# Patient Record
Sex: Female | Born: 1985 | Race: White | Hispanic: No | Marital: Single | Smoking: Former smoker
Health system: Southern US, Community
[De-identification: ages and names within clinical notes are randomized; demographics above are authoritative.]

## PROBLEM LIST (undated history)

## (undated) DIAGNOSIS — I10 Essential (primary) hypertension: Secondary | ICD-10-CM

## (undated) DIAGNOSIS — K5900 Constipation, unspecified: Secondary | ICD-10-CM

## (undated) DIAGNOSIS — N319 Neuromuscular dysfunction of bladder, unspecified: Secondary | ICD-10-CM

## (undated) DIAGNOSIS — G8929 Other chronic pain: Secondary | ICD-10-CM

## (undated) DIAGNOSIS — M545 Low back pain, unspecified: Secondary | ICD-10-CM

## (undated) DIAGNOSIS — M810 Age-related osteoporosis without current pathological fracture: Secondary | ICD-10-CM

## (undated) DIAGNOSIS — N135 Crossing vessel and stricture of ureter without hydronephrosis: Secondary | ICD-10-CM

## (undated) DIAGNOSIS — Z940 Kidney transplant status: Secondary | ICD-10-CM

## (undated) DIAGNOSIS — K219 Gastro-esophageal reflux disease without esophagitis: Secondary | ICD-10-CM

## (undated) DIAGNOSIS — M549 Dorsalgia, unspecified: Secondary | ICD-10-CM

## (undated) DIAGNOSIS — R002 Palpitations: Secondary | ICD-10-CM

## (undated) DIAGNOSIS — I639 Cerebral infarction, unspecified: Secondary | ICD-10-CM

## (undated) DIAGNOSIS — N289 Disorder of kidney and ureter, unspecified: Secondary | ICD-10-CM

## (undated) DIAGNOSIS — T8611 Kidney transplant rejection: Secondary | ICD-10-CM

## (undated) DIAGNOSIS — E7800 Pure hypercholesterolemia, unspecified: Secondary | ICD-10-CM

## (undated) DIAGNOSIS — N390 Urinary tract infection, site not specified: Secondary | ICD-10-CM

## (undated) DIAGNOSIS — F419 Anxiety disorder, unspecified: Secondary | ICD-10-CM

## (undated) DIAGNOSIS — R0602 Shortness of breath: Secondary | ICD-10-CM

## (undated) DIAGNOSIS — F32A Depression, unspecified: Secondary | ICD-10-CM

## (undated) DIAGNOSIS — R609 Edema, unspecified: Secondary | ICD-10-CM

## (undated) DIAGNOSIS — D649 Anemia, unspecified: Secondary | ICD-10-CM

## (undated) DIAGNOSIS — D6851 Activated protein C resistance: Secondary | ICD-10-CM

## (undated) DIAGNOSIS — J189 Pneumonia, unspecified organism: Secondary | ICD-10-CM

## (undated) DIAGNOSIS — Z789 Other specified health status: Secondary | ICD-10-CM

## (undated) DIAGNOSIS — M109 Gout, unspecified: Secondary | ICD-10-CM

## (undated) DIAGNOSIS — K682 Retroperitoneal fibrosis: Secondary | ICD-10-CM

## (undated) DIAGNOSIS — N83209 Unspecified ovarian cyst, unspecified side: Secondary | ICD-10-CM

## (undated) DIAGNOSIS — G43909 Migraine, unspecified, not intractable, without status migrainosus: Secondary | ICD-10-CM

## (undated) DIAGNOSIS — Z9981 Dependence on supplemental oxygen: Secondary | ICD-10-CM

## (undated) DIAGNOSIS — T8612 Kidney transplant failure: Secondary | ICD-10-CM

## (undated) DIAGNOSIS — R569 Unspecified convulsions: Secondary | ICD-10-CM

## (undated) DIAGNOSIS — M1990 Unspecified osteoarthritis, unspecified site: Secondary | ICD-10-CM

## (undated) DIAGNOSIS — F329 Major depressive disorder, single episode, unspecified: Secondary | ICD-10-CM

## (undated) DIAGNOSIS — E559 Vitamin D deficiency, unspecified: Secondary | ICD-10-CM

## (undated) HISTORY — DX: Vitamin D deficiency, unspecified: E55.9

## (undated) HISTORY — DX: Age-related osteoporosis without current pathological fracture: M81.0

## (undated) HISTORY — DX: Disorder of kidney and ureter, unspecified: N28.9

## (undated) HISTORY — PX: PACEMAKER INSERTION: SHX728

## (undated) HISTORY — DX: Essential (primary) hypertension: I10

## (undated) HISTORY — DX: Shortness of breath: R06.02

## (undated) HISTORY — PX: KIDNEY TRANSPLANT: SHX239

## (undated) HISTORY — PX: ABDOMINAL HERNIA REPAIR: SHX539

## (undated) HISTORY — DX: Anemia, unspecified: D64.9

## (undated) HISTORY — DX: Constipation, unspecified: K59.00

## (undated) HISTORY — DX: Palpitations: R00.2

## (undated) HISTORY — DX: Gastro-esophageal reflux disease without esophagitis: K21.9

## (undated) HISTORY — DX: Dorsalgia, unspecified: M54.9

## (undated) HISTORY — PX: PARTIAL NEPHRECTOMY: SHX414

## (undated) HISTORY — DX: Edema, unspecified: R60.9

## (undated) HISTORY — PX: HERNIA REPAIR: SHX51

---
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
## 2004-07-09 DIAGNOSIS — J189 Pneumonia, unspecified organism: Secondary | ICD-10-CM

## 2004-07-09 HISTORY — DX: Pneumonia, unspecified organism: J18.9

## 2008-05-14 ENCOUNTER — Ambulatory Visit (HOSPITAL_BASED_OUTPATIENT_CLINIC_OR_DEPARTMENT_OTHER): Admission: RE | Admit: 2008-05-14 | Discharge: 2008-05-14 | Payer: Self-pay | Admitting: Urology

## 2010-11-21 NOTE — Op Note (Signed)
NAMEANSELMA, Donna Shannon              ACCOUNT NO.:  [id number]   MEDICAL RECORD NO.:  [id number]          PATIENT TYPE:  AMB   LOCATION:  [facility]                         FACILITY:  [facility]   PHYSICIAN:  Jamison Neighbor, M.D.  DATE OF BIRTH:  1985-12-27   DATE OF PROCEDURE:  05/14/2008  DATE OF DISCHARGE:                               OPERATIVE REPORT   SERVICE:  Urology.   PREOPERATIVE DIAGNOSES:  1. Neurogenic bladder.  2. Interstitial cystitis.   POSTOPERATIVE DIAGNOSES:  1. Neurogenic bladder.  2. Interstitial cystitis.   PROCEDURE:  Cystoscopy, urethral calibration, hydrodistention of the  bladder, cauterization of ulcer, Foley catheter placement, Marcaine and  Pyridium instillation, Marcaine and Kenalog injection.   SURGEON:  Jamison Neighbor, M.D.   ANESTHESIA:  General.   COMPLICATIONS:  None.   DRAINS:  Foley catheter.   BRIEF HISTORY:  This 25 year old female has a very complicated urologic  history.  The patient underwent reconstructive surgery as a young child  by Dr. Mosetta Anis over in [facility].  The patient developed  renal failure and does have a transplanted kidney that seems to be  working normally.  Creatinine today was 1.3.  The patient has symptoms  of interstitial cystitis with chronic pelvic pain.  She has not  responded particularly well to medical therapy or to instillation  therapy.  It has been recommended to her that she consider  reaugmentation plus placement of a catheterized abdominal stoma.  The  patient has significant history of retroperitoneal fibrosis and that  surgery was certain to be quite complicated by her immunocompromised  status.  She is looking for alternatives.  She is now to undergo  cystoscopy and hydrodistention.  She understands the risks and benefits  of the procedure and gave full informed consent.   PROCEDURE IN DETAIL:  After successful induction of general anesthesia  the patient was placed in the dorsal lithotomy  position and prepped with  Betadine and draped in the usual sterile fashion.  Careful bimanual  examination revealed no significant cystocele, rectocele or enterocele.  The uterus was palpably normal.  The urethra was calibrated and accepted  a female urethral sound of 13 French with no signs of stenosis.  However, it was slightly angulated from the previous reconstruction and  may explain why the patient is sometimes slightly tight when she has to  catheterize.  The cystoscope was inserted.  The bladder was carefully  inspected.  There were areas that were somewhat irregular.  It was very  difficult to discern where the augmentation tissue was present and where  a normal bladder was present.  The patient had a single ureter that  could be easily identified.  The native ureters were never well seen.   The patient then underwent hydrodistention at a pressure of 100 cm of  water.  As the bladder was filling at 500 mL cracks started to be  noticeable on the left side and it was felt that it should not be filled  any further.  This ulcerated area did bleed and that area was  cauterized.  A cystogram  was done that showed no significant  extravasation although there is some irregularity in that area of the  ulcer.  The bladder was otherwise smooth with no signs of reflux in  either the native kidney or the transplant kidneys.  The bladder was  drained.  A mixture of Marcaine and Pyridium was left in the bladder.  Because of the bleeding it was felt a Foley catheter should be left in  place.  The patient was taken to the recovery room in good condition.  A  prescription was written for Tylox, doxycycline and Pyridium Plus.  We  will leave the Foley catheter in until the urine is clear.   We will discuss with the patient the possibly of trying serial Botox  injections and/or InterStim revision before recommending that she be  considered for augmentation.  It does appear clear however that the   patient does have diminished bladder capacity as the principal source  for her spasming and pain.      Jamison Neighbor, M.D.  Electronically Signed     RJE/MEDQ  D:  05/14/2008  T:  05/14/2008  Job:  [id number]

## 2011-04-10 LAB — BASIC METABOLIC PANEL
BUN: 14
Calcium: 9.2
Chloride: 110
GFR calc Af Amer: >60
GFR calc non Af Amer: 50 — ABNORMAL LOW
Glucose, Bld: 90
Potassium: 3.7
Sodium: 141

## 2011-04-10 LAB — POCT PREGNANCY, URINE: Preg Test, Ur: NEGATIVE

## 2011-04-10 LAB — PROTIME-INR: Prothrombin Time: 13

## 2011-04-30 DIAGNOSIS — Z7960 Long term (current) use of unspecified immunomodulators and immunosuppressants: Secondary | ICD-10-CM | POA: Insufficient documentation

## 2011-04-30 DIAGNOSIS — Z79899 Other long term (current) drug therapy: Secondary | ICD-10-CM | POA: Insufficient documentation

## 2011-04-30 DIAGNOSIS — D682 Hereditary deficiency of other clotting factors: Secondary | ICD-10-CM | POA: Insufficient documentation

## 2011-07-10 HISTORY — PX: OTHER SURGICAL HISTORY: SHX169

## 2011-12-22 DIAGNOSIS — R109 Unspecified abdominal pain: Secondary | ICD-10-CM | POA: Insufficient documentation

## 2011-12-22 DIAGNOSIS — N3010 Interstitial cystitis (chronic) without hematuria: Secondary | ICD-10-CM | POA: Insufficient documentation

## 2013-11-20 DIAGNOSIS — D849 Immunodeficiency, unspecified: Secondary | ICD-10-CM | POA: Insufficient documentation

## 2013-11-20 DIAGNOSIS — D899 Disorder involving the immune mechanism, unspecified: Secondary | ICD-10-CM

## 2014-03-18 DIAGNOSIS — N3941 Urge incontinence: Secondary | ICD-10-CM | POA: Insufficient documentation

## 2014-04-21 DIAGNOSIS — G43909 Migraine, unspecified, not intractable, without status migrainosus: Secondary | ICD-10-CM | POA: Insufficient documentation

## 2014-04-29 DIAGNOSIS — D638 Anemia in other chronic diseases classified elsewhere: Secondary | ICD-10-CM | POA: Insufficient documentation

## 2014-05-31 DIAGNOSIS — E781 Pure hyperglyceridemia: Secondary | ICD-10-CM | POA: Insufficient documentation

## 2015-02-17 DIAGNOSIS — M810 Age-related osteoporosis without current pathological fracture: Secondary | ICD-10-CM | POA: Insufficient documentation

## 2015-05-04 DIAGNOSIS — F5104 Psychophysiologic insomnia: Secondary | ICD-10-CM | POA: Insufficient documentation

## 2015-07-10 HISTORY — PX: SALPINGECTOMY: SHX328

## 2015-11-07 HISTORY — PX: PORTACATH PLACEMENT: SHX2246

## 2016-04-24 ENCOUNTER — Inpatient Hospital Stay (HOSPITAL_COMMUNITY)
Admission: AD | Admit: 2016-04-24 | Discharge: 2016-04-30 | DRG: 641 | Disposition: A | Discharge status: Home / Self Care | Payer: Medicare Other | Source: Ambulatory Visit | Attending: Internal Medicine | Admitting: Internal Medicine

## 2016-04-24 ENCOUNTER — Encounter (HOSPITAL_COMMUNITY): Payer: Self-pay | Admitting: Internal Medicine

## 2016-04-24 DIAGNOSIS — N182 Chronic kidney disease, stage 2 (mild): Secondary | ICD-10-CM | POA: Diagnosis not present

## 2016-04-24 DIAGNOSIS — F418 Other specified anxiety disorders: Secondary | ICD-10-CM | POA: Diagnosis present

## 2016-04-24 DIAGNOSIS — F329 Major depressive disorder, single episode, unspecified: Secondary | ICD-10-CM | POA: Diagnosis not present

## 2016-04-24 DIAGNOSIS — F32A Depression, unspecified: Secondary | ICD-10-CM | POA: Diagnosis present

## 2016-04-24 DIAGNOSIS — Z8701 Personal history of pneumonia (recurrent): Secondary | ICD-10-CM

## 2016-04-24 DIAGNOSIS — G4733 Obstructive sleep apnea (adult) (pediatric): Secondary | ICD-10-CM

## 2016-04-24 DIAGNOSIS — N183 Chronic kidney disease, stage 3 unspecified: Secondary | ICD-10-CM

## 2016-04-24 DIAGNOSIS — Z8673 Personal history of transient ischemic attack (TIA), and cerebral infarction without residual deficits: Secondary | ICD-10-CM

## 2016-04-24 DIAGNOSIS — E669 Obesity, unspecified: Secondary | ICD-10-CM | POA: Diagnosis present

## 2016-04-24 DIAGNOSIS — I10 Essential (primary) hypertension: Secondary | ICD-10-CM | POA: Diagnosis present

## 2016-04-24 DIAGNOSIS — M109 Gout, unspecified: Secondary | ICD-10-CM | POA: Diagnosis present

## 2016-04-24 DIAGNOSIS — Z7952 Long term (current) use of systemic steroids: Secondary | ICD-10-CM

## 2016-04-24 DIAGNOSIS — Z940 Kidney transplant status: Secondary | ICD-10-CM

## 2016-04-24 DIAGNOSIS — E6609 Other obesity due to excess calories: Secondary | ICD-10-CM

## 2016-04-24 DIAGNOSIS — I669 Occlusion and stenosis of unspecified cerebral artery: Secondary | ICD-10-CM

## 2016-04-24 DIAGNOSIS — E7800 Pure hypercholesterolemia, unspecified: Secondary | ICD-10-CM | POA: Diagnosis present

## 2016-04-24 DIAGNOSIS — R0602 Shortness of breath: Secondary | ICD-10-CM

## 2016-04-24 DIAGNOSIS — R519 Headache, unspecified: Secondary | ICD-10-CM

## 2016-04-24 DIAGNOSIS — E875 Hyperkalemia: Secondary | ICD-10-CM | POA: Diagnosis not present

## 2016-04-24 DIAGNOSIS — Z79899 Other long term (current) drug therapy: Secondary | ICD-10-CM

## 2016-04-24 DIAGNOSIS — G932 Benign intracranial hypertension: Secondary | ICD-10-CM | POA: Diagnosis not present

## 2016-04-24 DIAGNOSIS — G43909 Migraine, unspecified, not intractable, without status migrainosus: Secondary | ICD-10-CM | POA: Diagnosis present

## 2016-04-24 DIAGNOSIS — R51 Headache: Secondary | ICD-10-CM

## 2016-04-24 DIAGNOSIS — Z87891 Personal history of nicotine dependence: Secondary | ICD-10-CM

## 2016-04-24 DIAGNOSIS — I5032 Chronic diastolic (congestive) heart failure: Secondary | ICD-10-CM | POA: Diagnosis present

## 2016-04-24 DIAGNOSIS — E8770 Fluid overload, unspecified: Principal | ICD-10-CM | POA: Diagnosis present

## 2016-04-24 DIAGNOSIS — Z9181 History of falling: Secondary | ICD-10-CM

## 2016-04-24 DIAGNOSIS — R609 Edema, unspecified: Secondary | ICD-10-CM

## 2016-04-24 DIAGNOSIS — N3289 Other specified disorders of bladder: Secondary | ICD-10-CM | POA: Diagnosis present

## 2016-04-24 DIAGNOSIS — Z6838 Body mass index (BMI) 38.0-38.9, adult: Secondary | ICD-10-CM

## 2016-04-24 DIAGNOSIS — N319 Neuromuscular dysfunction of bladder, unspecified: Secondary | ICD-10-CM | POA: Diagnosis present

## 2016-04-24 DIAGNOSIS — Z87440 Personal history of urinary (tract) infections: Secondary | ICD-10-CM

## 2016-04-24 DIAGNOSIS — E662 Morbid (severe) obesity with alveolar hypoventilation: Secondary | ICD-10-CM | POA: Diagnosis present

## 2016-04-24 DIAGNOSIS — F909 Attention-deficit hyperactivity disorder, unspecified type: Secondary | ICD-10-CM | POA: Diagnosis present

## 2016-04-24 DIAGNOSIS — Z8249 Family history of ischemic heart disease and other diseases of the circulatory system: Secondary | ICD-10-CM

## 2016-04-24 DIAGNOSIS — Z23 Encounter for immunization: Secondary | ICD-10-CM

## 2016-04-24 DIAGNOSIS — D6851 Activated protein C resistance: Secondary | ICD-10-CM | POA: Diagnosis present

## 2016-04-24 DIAGNOSIS — I130 Hypertensive heart and chronic kidney disease with heart failure and stage 1 through stage 4 chronic kidney disease, or unspecified chronic kidney disease: Secondary | ICD-10-CM | POA: Diagnosis present

## 2016-04-24 DIAGNOSIS — G894 Chronic pain syndrome: Secondary | ICD-10-CM | POA: Diagnosis present

## 2016-04-24 DIAGNOSIS — Z9981 Dependence on supplemental oxygen: Secondary | ICD-10-CM

## 2016-04-24 HISTORY — DX: Low back pain: M54.5

## 2016-04-24 HISTORY — DX: Essential (primary) hypertension: I10

## 2016-04-24 HISTORY — DX: Cerebral infarction, unspecified: I63.9

## 2016-04-24 HISTORY — DX: Activated protein C resistance: D68.51

## 2016-04-24 HISTORY — DX: Urinary tract infection, site not specified: N39.0

## 2016-04-24 HISTORY — DX: Kidney transplant rejection: T86.11

## 2016-04-24 HISTORY — DX: Retroperitoneal fibrosis: K68.2

## 2016-04-24 HISTORY — DX: Migraine, unspecified, not intractable, without status migrainosus: G43.909

## 2016-04-24 HISTORY — DX: Pneumonia, unspecified organism: J18.9

## 2016-04-24 HISTORY — DX: Unspecified ovarian cyst, unspecified side: N83.209

## 2016-04-24 HISTORY — DX: Other chronic pain: G89.29

## 2016-04-24 HISTORY — DX: Anxiety disorder, unspecified: F41.9

## 2016-04-24 HISTORY — DX: Depression, unspecified: F32.A

## 2016-04-24 HISTORY — DX: Other specified health status: Z78.9

## 2016-04-24 HISTORY — DX: Gout, unspecified: M10.9

## 2016-04-24 HISTORY — DX: Unspecified osteoarthritis, unspecified site: M19.90

## 2016-04-24 HISTORY — DX: Kidney transplant failure: T86.12

## 2016-04-24 HISTORY — DX: Neuromuscular dysfunction of bladder, unspecified: N31.9

## 2016-04-24 HISTORY — DX: Kidney transplant status: Z94.0

## 2016-04-24 HISTORY — DX: Major depressive disorder, single episode, unspecified: F32.9

## 2016-04-24 HISTORY — DX: Dependence on supplemental oxygen: Z99.81

## 2016-04-24 HISTORY — DX: Crossing vessel and stricture of ureter without hydronephrosis: N13.5

## 2016-04-24 HISTORY — DX: Low back pain, unspecified: M54.50

## 2016-04-24 HISTORY — DX: Pure hypercholesterolemia, unspecified: E78.00

## 2016-04-24 MED ORDER — HYDROXYZINE HCL 25 MG PO TABS: 25.0000 mg | ORAL_TABLET | Freq: Three times a day (TID) | ORAL | Status: DC | PRN

## 2016-04-24 MED ORDER — ZOLPIDEM TARTRATE 5 MG PO TABS
5.0000 mg | ORAL_TABLET | Freq: Every evening | ORAL | Status: DC | PRN
  Administered 2016-04-24 – 2016-04-29 (×5): 5 mg via ORAL
  Filled 2016-04-24 (×5): qty 1

## 2016-04-24 MED ORDER — NEPRO/CARBSTEADY PO LIQD: 237.0000 mL | Freq: Three times a day (TID) | ORAL | Status: DC | PRN

## 2016-04-24 MED ORDER — OMEGA-3-ACID ETHYL ESTERS 1 G PO CAPS
1.0000 g | ORAL_CAPSULE | Freq: Three times a day (TID) | ORAL | Status: DC
  Administered 2016-04-25 – 2016-04-30 (×17): 1 g via ORAL
  Filled 2016-04-24 (×17): qty 1

## 2016-04-24 MED ORDER — FLAVOXATE HCL 100 MG PO TABS
100.0000 mg | ORAL_TABLET | Freq: Three times a day (TID) | ORAL | Status: DC
  Administered 2016-04-25 – 2016-04-30 (×16): 100 mg via ORAL
  Filled 2016-04-24 (×18): qty 1

## 2016-04-24 MED ORDER — ACETAMINOPHEN 325 MG PO TABS
650.0000 mg | ORAL_TABLET | Freq: Four times a day (QID) | ORAL | Status: DC | PRN
  Administered 2016-04-27: 650 mg via ORAL
  Filled 2016-04-24 (×2): qty 2

## 2016-04-24 MED ORDER — TRAMADOL HCL 50 MG PO TABS
50.0000 mg | ORAL_TABLET | Freq: Four times a day (QID) | ORAL | Status: DC | PRN
  Administered 2016-04-25 – 2016-04-29 (×9): 50 mg via ORAL
  Filled 2016-04-24 (×11): qty 1

## 2016-04-24 MED ORDER — MAGNESIUM OXIDE 400 (241.3 MG) MG PO TABS
800.0000 mg | ORAL_TABLET | Freq: Two times a day (BID) | ORAL | Status: DC
  Administered 2016-04-25 – 2016-04-30 (×12): 800 mg via ORAL
  Filled 2016-04-24 (×12): qty 2

## 2016-04-24 MED ORDER — FENOFIBRATE 160 MG PO TABS
160.0000 mg | ORAL_TABLET | Freq: Every day | ORAL | Status: DC
  Administered 2016-04-25 – 2016-04-30 (×6): 160 mg via ORAL
  Filled 2016-04-24 (×6): qty 1

## 2016-04-24 MED ORDER — CAMPHOR-MENTHOL 0.5-0.5 % EX LOTN
1.0000 "application " | TOPICAL_LOTION | Freq: Three times a day (TID) | CUTANEOUS | Status: DC | PRN
  Filled 2016-04-24: qty 222

## 2016-04-24 MED ORDER — FUROSEMIDE 10 MG/ML IJ SOLN
80.0000 mg | Freq: Two times a day (BID) | INTRAMUSCULAR | Status: DC
  Administered 2016-04-24 – 2016-04-26 (×4): 80 mg via INTRAVENOUS
  Filled 2016-04-24 (×4): qty 8

## 2016-04-24 MED ORDER — DOCUSATE SODIUM 283 MG RE ENEM
1.0000 | ENEMA | RECTAL | Status: DC | PRN
  Filled 2016-04-24: qty 1

## 2016-04-24 MED ORDER — PROMETHAZINE HCL 25 MG RE SUPP
25.0000 mg | Freq: Four times a day (QID) | RECTAL | Status: DC | PRN
  Administered 2016-04-26 – 2016-04-28 (×3): 25 mg via RECTAL
  Filled 2016-04-24 (×3): qty 1

## 2016-04-24 MED ORDER — ONDANSETRON HCL 4 MG PO TABS
4.0000 mg | ORAL_TABLET | Freq: Four times a day (QID) | ORAL | Status: DC | PRN
  Administered 2016-04-25 – 2016-04-29 (×2): 4 mg via ORAL
  Filled 2016-04-24 (×2): qty 1

## 2016-04-24 MED ORDER — ASPIRIN 81 MG PO CHEW
81.0000 mg | CHEWABLE_TABLET | Freq: Every day | ORAL | Status: DC
  Administered 2016-04-25 – 2016-04-30 (×6): 81 mg via ORAL
  Filled 2016-04-24 (×6): qty 1

## 2016-04-24 MED ORDER — CLONAZEPAM 1 MG PO TABS
1.0000 mg | ORAL_TABLET | Freq: Two times a day (BID) | ORAL | Status: DC
Start: 2016-04-25 — End: 2016-04-30
  Administered 2016-04-25 – 2016-04-30 (×12): 1 mg via ORAL
  Filled 2016-04-24 (×12): qty 1

## 2016-04-24 MED ORDER — PREDNISONE 5 MG PO TABS
2.5000 mg | ORAL_TABLET | Freq: Every day | ORAL | Status: DC
  Administered 2016-04-25 – 2016-04-30 (×6): 2.5 mg via ORAL
  Filled 2016-04-24 (×6): qty 1

## 2016-04-24 MED ORDER — ICOSAPENT ETHYL 1 G PO CAPS: 1.0000 | ORAL_CAPSULE | Freq: Two times a day (BID) | ORAL | Status: DC

## 2016-04-24 MED ORDER — LEVETIRACETAM 500 MG PO TABS
500.0000 mg | ORAL_TABLET | Freq: Two times a day (BID) | ORAL | Status: DC
  Administered 2016-04-25 – 2016-04-30 (×12): 500 mg via ORAL
  Filled 2016-04-24 (×12): qty 1

## 2016-04-24 MED ORDER — TERIPARATIDE (RECOMBINANT) 600 MCG/2.4ML ~~LOC~~ SOLN: 20.0000 ug | Freq: Every day | SUBCUTANEOUS | Status: DC

## 2016-04-24 MED ORDER — SODIUM CHLORIDE 0.9% FLUSH
10.0000 mL | INTRAVENOUS | Status: DC | PRN
  Administered 2016-04-29 – 2016-04-30 (×2): 20 mL
  Filled 2016-04-24 (×2): qty 40

## 2016-04-24 MED ORDER — CLONAZEPAM 0.5 MG PO TABS
0.5000 mg | ORAL_TABLET | ORAL | Status: DC
  Administered 2016-04-25 – 2016-04-29 (×5): 0.5 mg via ORAL
  Filled 2016-04-24 (×5): qty 1

## 2016-04-24 MED ORDER — CYCLOSPORINE MODIFIED (NEORAL) 25 MG PO CAPS
25.0000 mg | ORAL_CAPSULE | Freq: Every day | ORAL | Status: DC
  Administered 2016-04-25 – 2016-04-30 (×6): 25 mg via ORAL
  Filled 2016-04-24 (×6): qty 1

## 2016-04-24 MED ORDER — MYCOPHENOLATE MOFETIL 250 MG PO CAPS
500.0000 mg | ORAL_CAPSULE | Freq: Two times a day (BID) | ORAL | Status: DC
  Administered 2016-04-24 – 2016-04-30 (×12): 500 mg via ORAL
  Filled 2016-04-24 (×12): qty 2

## 2016-04-24 MED ORDER — PROCHLORPERAZINE MALEATE 10 MG PO TABS
10.0000 mg | ORAL_TABLET | Freq: Four times a day (QID) | ORAL | Status: DC | PRN
  Filled 2016-04-24 (×2): qty 1

## 2016-04-24 MED ORDER — ONDANSETRON HCL 4 MG/2ML IJ SOLN
4.0000 mg | Freq: Four times a day (QID) | INTRAMUSCULAR | Status: DC | PRN
  Administered 2016-04-28 – 2016-04-29 (×3): 4 mg via INTRAVENOUS
  Filled 2016-04-24 (×4): qty 2

## 2016-04-24 MED ORDER — METOPROLOL TARTRATE 100 MG PO TABS
100.0000 mg | ORAL_TABLET | Freq: Two times a day (BID) | ORAL | Status: DC
  Administered 2016-04-25: 100 mg via ORAL
  Filled 2016-04-24: qty 2

## 2016-04-24 MED ORDER — PRAVASTATIN SODIUM 40 MG PO TABS
40.0000 mg | ORAL_TABLET | Freq: Every day | ORAL | Status: DC
  Administered 2016-04-25 – 2016-04-29 (×6): 40 mg via ORAL
  Filled 2016-04-24 (×6): qty 1

## 2016-04-24 MED ORDER — SORBITOL 70 % SOLN: 30.0000 mL | Status: DC | PRN

## 2016-04-24 MED ORDER — HYDROCODONE-ACETAMINOPHEN 5-325 MG PO TABS
1.0000 | ORAL_TABLET | ORAL | Status: DC | PRN
  Administered 2016-04-24 – 2016-04-25 (×4): 1 via ORAL
  Filled 2016-04-24 (×5): qty 1

## 2016-04-24 MED ORDER — NORETHINDRONE 0.35 MG PO TABS
1.0000 | ORAL_TABLET | Freq: Every day | ORAL | Status: DC
  Administered 2016-04-27 – 2016-04-30 (×4): 0.35 mg via ORAL

## 2016-04-24 MED ORDER — GABAPENTIN 300 MG PO CAPS
300.0000 mg | ORAL_CAPSULE | Freq: Three times a day (TID) | ORAL | Status: DC
  Administered 2016-04-25 – 2016-04-30 (×17): 300 mg via ORAL
  Filled 2016-04-24 (×17): qty 1

## 2016-04-24 MED ORDER — CALCIUM CARBONATE ANTACID 1250 MG/5ML PO SUSP
500.0000 mg | Freq: Four times a day (QID) | ORAL | Status: DC | PRN
  Filled 2016-04-24: qty 5

## 2016-04-24 MED ORDER — FAMOTIDINE 20 MG PO TABS
20.0000 mg | ORAL_TABLET | Freq: Every day | ORAL | Status: DC
  Administered 2016-04-25 – 2016-04-30 (×6): 20 mg via ORAL
  Filled 2016-04-24 (×6): qty 1

## 2016-04-24 MED ORDER — INFLUENZA VAC SPLIT QUAD 0.5 ML IM SUSY
0.5000 mL | PREFILLED_SYRINGE | INTRAMUSCULAR | Status: AC
  Administered 2016-04-25: 0.5 mL via INTRAMUSCULAR
  Filled 2016-04-24: qty 0.5

## 2016-04-24 MED ORDER — GEMFIBROZIL 600 MG PO TABS
600.0000 mg | ORAL_TABLET | Freq: Two times a day (BID) | ORAL | Status: DC
  Administered 2016-04-25 – 2016-04-30 (×11): 600 mg via ORAL
  Filled 2016-04-24 (×14): qty 1

## 2016-04-24 MED ORDER — CLONAZEPAM 0.5 MG PO TABS: 0.5000 mg | ORAL_TABLET | ORAL | Status: DC

## 2016-04-24 MED ORDER — ENOXAPARIN SODIUM 40 MG/0.4ML ~~LOC~~ SOLN
40.0000 mg | SUBCUTANEOUS | Status: DC
  Administered 2016-04-24 – 2016-04-29 (×4): 40 mg via SUBCUTANEOUS
  Filled 2016-04-24 (×5): qty 0.4

## 2016-04-24 MED ORDER — ACETAMINOPHEN 650 MG RE SUPP: 650.0000 mg | Freq: Four times a day (QID) | RECTAL | Status: DC | PRN

## 2016-04-24 MED ORDER — CYCLOSPORINE MODIFIED (NEORAL) 25 MG PO CAPS
100.0000 mg | ORAL_CAPSULE | Freq: Two times a day (BID) | ORAL | Status: DC
  Administered 2016-04-24 – 2016-04-30 (×12): 100 mg via ORAL
  Filled 2016-04-24 (×12): qty 4

## 2016-04-24 MED ORDER — ALLOPURINOL 100 MG PO TABS
100.0000 mg | ORAL_TABLET | Freq: Two times a day (BID) | ORAL | Status: DC
  Administered 2016-04-25 – 2016-04-30 (×12): 100 mg via ORAL
  Filled 2016-04-24 (×12): qty 1

## 2016-04-24 MED ORDER — SODIUM CHLORIDE 0.9 % IV SOLN
INTRAVENOUS | Status: DC
  Administered 2016-04-24 – 2016-04-25 (×2): via INTRAVENOUS

## 2016-04-24 MED ORDER — SERTRALINE HCL 100 MG PO TABS
100.0000 mg | ORAL_TABLET | Freq: Two times a day (BID) | ORAL | Status: DC
  Administered 2016-04-25 – 2016-04-30 (×12): 100 mg via ORAL
  Filled 2016-04-24 (×12): qty 1

## 2016-04-24 NOTE — H&P (Signed)
History and Physical    Donna Shannon ZOX:[id number] DOB: Dec 15, 1985 DOA: 04/24/2016  PCP: Marcelyn Bruins - urology Consultants:  Alba Cory - nephrology; Teofilo Pod - cardiology [facility]); GYN - [facility] Patient coming from: home - lives with parents   Chief Complaint: volume overload  HPI: Donna Shannon is a 30 y.o. 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   Patient reports that she weighed 160 in August.  Dr. Logan Bores started her on Lyrica for bladder pain (didn't want to be on narcotics anymore so only taking Ultram and Lyrica) - in just 2 months she was up to 200 pounds.  He stopped the Lyrica, now on Gabapentin.  Also came off of Depo Provera because of weight (went on Forteo due to osteoporosis in the spine).  Here because she is so fluid overloaded that she can't breathe, has to use O2 at night and thinks she needs it all day too.  Wheezing, cough, can't go up.down stairs.  Symptoms since September.  Works for [employer] and unable to work because she has too much DOE..  Breaks out in cold sweats.  Had PNA in September.  Low salt diet, cut down on sodas (but drinking juice), increased water intake.  Can't exercise because she can't breathe, needs inhaler now.  Using inhaler once daily.  She has retroperitoneal fibrosis, diagnosed at age 24.  Initial transplant in 1997 at age 28yo.  Failed with rejection, re-transplanted at age 71yo.  Has been on both HD and peritoneal HD in the past, between transplants.  Has done well since second transplant.  Has a port-a-cath for IVF, antibiotics.     Messaged Dr. Kathrene Bongo Monday and she suggested doubling Lasix from 20 mg daily to 40 mg daily.  Increased urination but no significant change.  Saw Dr. Kathrene Bongo in the office today and she begged her for  admission for aggressive diuresis.    Has neurogenic bladder, has pacemaker implanted in spine for bladder.  Does I/O caths at home.  Needs foley catheter while receiving aggressive Lasix.  Broke her ankle last week and is a fall risk.  Also with depression.  Her brother committed suicide 2 years ago and she continues to struggle over this loss.   Review of Systems: As per HPI; otherwise 10 point review of systems reviewed and negative.   Ambulatory Status:  Currently ambulates wearing CAM walker with a cane  Past Medical History:  Diagnosis Date  . Anxiety   . Arthritis    "left knee" (04/24/2016)  . Chronic lower back pain   . Depression   . Essential hypertension   . Factor V Leiden (HCC)    [name] 04/24/2016  . Frequent UTI    [name] 04/24/2016  . Gout   . High cholesterol   . Migraine    "weekly" (04/24/2016)  . Neurogenic bladder    augmented bladder, I/O self caths  . On home oxygen therapy    "2L; every night" (04/24/2016)  . Ovarian cyst dx'd 04/22/2016   left  . Pneumonia 03/2016   [name] 04/24/2016  . Renal transplant failure and rejection    age 34-11  . 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 Self-catheterizes urinary bladder    "~ 12 X/day" (04/24/2016)  . Stroke [facility])  1991   denies residual on 04/24/2016    Past Surgical History:  Procedure Laterality Date  . ABDOMINAL HERNIA REPAIR  "several; when I was little"  . allograph biopsy  2013   [name]/notes 04/24/2016  . HERNIA REPAIR    . KIDNEY TRANSPLANT  1996; 1998   father was donor; mother was donor/notes 04/24/2016  . PACEMAKER INSERTION  "?early 2000s"   for bladder function  . PARTIAL NEPHRECTOMY  1990s X 5   "removing disease"  . PORTACATH PLACEMENT Right 11/2015  . RIGHT OOPHORECTOMY Right 2001   ovarian torsion    Social History   Social History  . Marital status: Single    Spouse name: N/A  . Number of children: N/A  . Years of  education: N/A   Occupational History  . school nurse    Social History Main Topics  . Smoking status: Former Smoker    Packs/day: 0.50    Years: 4.00    Types: Cigarettes    Quit date: 07/09/2014  . Smokeless tobacco: Never Used  . Alcohol use Yes     Comment: 04/24/2016 "might have 2 drinks/month, if that"  . Drug use: No  . Sexual activity: No   Other Topics Concern  . Not on file   Social History Narrative  . No narrative on file    No Known Allergies  Family History  Problem Relation Age of Onset  . CAD Father     s/p CABG    Prior to Admission medications   Not on File    Physical Exam: Vitals:   04/24/16 1812 04/24/16 2102  BP: 129/82 126/81  Pulse: 92 99  Resp: 18 16  Temp: 98.2 F (36.8 C) 99.4 F (37.4 C)  TempSrc: Oral Oral  SpO2: 98% 99%     General:  Appears calm and comfortable and is NAD Eyes: PERRL, EOMI, normal lids, iris ENT:  grossly normal hearing, lips & tongue, mmm Neck:  no LAD, masses or thyromegaly Cardiovascular:  RRR, no m/r/g. No LE edema.  Respiratory:  CTA bilaterally, no w/r/r. Normal respiratory effort. Abdomen:  soft, ntnd, NABS, many well-healed surgical scars Skin:  no rash or induration seen on limited exam Musculoskeletal:  grossly normal tone BUE/BLE, good ROM, no bony abnormality Psychiatric:  grossly normal mood and affect, speech fluent and appropriate, AOx3 Neurologic:  CN 2-12 grossly intact, moves all extremities in coordinated fashion, sensation intact  Labs on Admission: I have personally reviewed following labs and imaging studies  CBC: No results for input(s): WBC, NEUTROABS, HGB, HCT, MCV, PLT in the last 168 hours. Basic Metabolic Panel: No results for input(s): NA, K, CL, CO2, GLUCOSE, BUN, CREATININE, CALCIUM, MG, PHOS in the last 168 hours. GFR: CrCl cannot be calculated (Unknown ideal weight.). Liver Function Tests: No results for input(s): AST, ALT, ALKPHOS, BILITOT, PROT, ALBUMIN in the last  168 hours. No results for input(s): LIPASE, AMYLASE in the last 168 hours. No results for input(s): AMMONIA in the last 168 hours. Coagulation Profile: No results for input(s): INR, PROTIME in the last 168 hours. Cardiac Enzymes: No results for input(s): CKTOTAL, CKMB, CKMBINDEX, TROPONINI in the last 168 hours. BNP (last 3 results) No results for input(s): PROBNP in the last 8760 hours. HbA1C: No results for input(s): HGBA1C in the last 72 hours. CBG: No results for input(s): GLUCAP in the last 168 hours. Lipid Profile: No results for input(s): CHOL, HDL, LDLCALC, TRIG, CHOLHDL, LDLDIRECT in the last 72 hours. Thyroid Function  Tests: No results for input(s): TSH, T4TOTAL, FREET4, T3FREE, THYROIDAB in the last 72 hours. Anemia Panel: No results for input(s): VITAMINB12, FOLATE, FERRITIN, TIBC, IRON, RETICCTPCT in the last 72 hours. Urine analysis: No results found for: COLORURINE, APPEARANCEUR, LABSPEC, PHURINE, GLUCOSEU, HGBUR, BILIRUBINUR, KETONESUR, PROTEINUR, UROBILINOGEN, NITRITE, LEUKOCYTESUR  Creatinine Clearance: CrCl cannot be calculated (Unknown ideal weight.).  Sepsis Labs: @LABRCNTIP (procalcitonin:4,lacticidven:4) )No results found for this or any previous visit (from the past 240 hour(s)).   Radiological Exams on Admission: No results found.  EKG: Not done  Assessment/Plan Principal Problem:   Volume overload Active Problems:   Obesity   Renal transplant recipient   Depression   Neurogenic bladder   Essential hypertension   CKD (chronic kidney disease) stage 2, GFR 60-89 ml/min   Volume overload -Patient with h/o kidney transplant -May have mild volume overload -Has not responded to 20mg  or 40mg  PO Lasix -Patient admitted for 80 mg IV BID Lasix -Nephrology is following  Obesity -While volume overload is likely contributing, I suspect that her weight is also due to medication side effects (Depo, Lyrica/gabapentin) and that some of what will be required  is diet/exercise -Her respiratory compromise/need for Albuterol and nighttime O2 is likely OSA/OHS related to rapid weight gain and her overall habitus -Will request nutrition assessment -Will check TSH  S/p renal transplant -Continue rejection medications -Steroids are likely also contributing to weight gain/rapid progression of obesity -Current creatinine is indicative of mild CKD, no current concern for volume deficiency based on probable mild overload  Neurogenic bladder -Has bladder pacemaker -I/O cath at home -Will place foley based on projected more aggressive diuresis -Continue home meds  Depression (with anxiety, ADHD) -Continue Klonopin, Zoloft, Ambien -Hold Ritalin - should not require as inpatient  HTN -Hold Lopressor at Dr. Jon Gills recommendation due to aggressive diuresis with Lasix  DVT prophylaxis: Lovenox  Code Status: Full - confirmed with patient Family Communication: None present Disposition Plan:  Home once clinically improved Consults called: Nephrology  Admission status: It is my clinical opinion that referral for OBSERVATION is reasonable and necessary in this patient based on the above information provided. The aforementioned taken together are felt to place the patient at high risk for further clinical deterioration. However it is anticipated that the patient may be medically stable for discharge from the hospital within 24 to 48 hours.     Jonah Blue MD [facility]  If 7PM-7AM, please contact night-coverage [email or web address] Password [email or web address]  04/25/2016, 1:01 AM

## 2016-04-24 NOTE — Consult Note (Signed)
[facility] Renal Consultation Note  Requesting MD:  Indication for Consultation:   HPI:  Donna Shannon is a 30 y.o. female. with retroperitoneal fibrosis in early childhood leading to obstructive uropathy requiring kidney transplant times 2 and continued difficulty with bladder dysfunction requiring in and out bladder catheterizations. She underwent her 1st kidney transplant in [id number] at the age of 30 from her father. Unfortunately, that kidney rejected in 1998. She underwent a 2nd kidney transplant in 1998 this time with her mother as a donor and has done relatively well since. 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 She had developed transaminitis with Prograf so is currently on cyclosporine. Her creatinine has been as high as 1.8 in the past. Patient has a Port-A-Cath in place for frequent lab draws and also apparently receives IV fluids not infrequently for elevated creatinine? Because she does  bladder catheterizations she does get fairly frequent urinary tract infections. Her other significant past medical history includes factor 5 Leiden- she was on Coumadin in the past but now that has been discontinued. She continues to follow pretty closely with a urologist Dr. Logan BoresEvans at [facility] transferred her nephrology care to me since March of this year.   This is my 3rd visit with Donna Shannon. I saw her last in June- She has had one UTI since in July- treated with levaquin- follow up urine was clear- last creatinine in August was 1.6. She then notified me in September that she was diagnosed with PNA- treated with levaquin- this got better. Also unbeknowst to me she had been started on lyrica by Dr. Logan BoresEvans- she began swelling and it was thought due to that med so was stopped. However, swelling did not improve. I never heard about this. She continued on her lasix 20 mg daily. She has had medication manipulation by her urologist and  cardiologist, is off of amlodipine and is only on a small dose of prednisone. She saw urology and also got botox for her bladder recently. The only messages pt was sending me was to have her clonopin dose increased.   Then I received urgent message 10/16 as pt had opening of old bladder incision with much drainage- [facility] scan reported showed cellullitis although I do not have that report- she was given levaquin and I suggested she see Dr. Logan BoresEvans to make sure wound was OK- pt indicates it drained a lot and looks a lot better. Dr. Logan BoresEVans suggested that I admit pt for IV diuretics- beds were not avail last night- I had pt double up on her lasix, take 40 PO last night and this AM but she states that has had no effect. She is producing urine but needs to use cath to get it out. She desires admission. [facility]2 but I do not have that report- it actually was 1.4  Creatinine, Ser  Date/Time Value Ref Range Status  05/14/2008 10:11 AM 1.30 (H)  Final     PMHx:  No past medical history on file.  No past surgical history on file.  Family Hx: No family history on file.  Social History:  has no tobacco, alcohol, and drug history on file.  Allergies: Allergies not on file  Medications: Prior to Admission medications   Not on File     I have reviewed the patient's current medications. Prior to Admission:  No prescriptions prior to admission.  These are the  home meds I have for her minus amlodipine which she is no longer taking  ClonazePAM (0.5MG  Tablet, 1 Oral 2am,2 tab afternoon,1 pm, Taken starting 04/03/2016) Active. Zolpidem Tartrate (10MG  Tablet, 1 Oral at bedtime as needed, Taken starting 03/19/2016) Active. Sodium Bicarbonate (325MG  Tablet, 3 (three) Oral two times daily, Taken starting 03/02/2016) Active. Magnesium Oxide (400MG  Tablet, 4 Oral 2 tablet twice a day, Taken starting 02/01/2016) Active. Furosemide (40MG   Tablet, 1/2 Oral daily, Taken starting 01/12/2016) Active. Increased last night and today to 40 BID Mycophenolate Mofetil (500MG  Tablet, 1 Oral two times daily, Taken starting 10/20/2015) Active. (z94.0) Sertraline HCl (100MG  Tablet, 2 Oral daily, Taken starting 10/11/2015) Active. Vitamin D (1000UNIT Tablet, 1 (one) Oral daily, Taken starting 10/11/2015) Active. Prochlorperazine Maleate (10MG  Tablet, 1 (one) Oral as needed, Taken starting 10/11/2015) Active. AmLODIPine Besylate (10MG  Tablet, 1 Oral QHS, Taken starting 10/11/2015) Active. Gabapentin (100MG  Capsule, 1 Oral three times daily) Active. Fenofibrate (160MG  Tablet, 1 Oral daily) Active. SUMAtriptan Succinate (100MG  Tablet, 1 Oral two times daily prn) Active. Allopurinol (100MG  Tablet, 2 Oral daily) Active. Aspirin (81MG  Tablet Chewable, 1 Oral daily) Active. Metoprolol Tartrate (100MG  Tablet, 1 Oral two times daily) Active. CycloSPORINE (100MG  Capsule, 1 Oral two times daily) Active. CycloSPORINE (25MG  Capsule, 1 Oral every morning) Active. Omega 3 (1000MG  Capsule, 1 Oral three times daily) Active. Myrbetriq (50MG  Tablet ER 24HR, 1 Oral daily) Active. Pravastatin Sodium (20MG  Tablet, 1 Oral at bedtime) Active. PredniSONE (2.5MG  Tablet, 1 Oral daily) Active. Ultram (50MG  Tablet, 1 Oral four times daily) Active. Ritalin (10MG  Tablet, 2 Oral daily) Active. Forteo (750MCG/3ML Solution, 1 Subcutaneous daily) Active.   Labs: No results found for this or any previous visit (from the past 48 hour(s)).   ROS:  A comprehensive review of systems was negative except for: Respiratory: positive for dyspnea on exertion Cardiovascular: positive for lower extremity edema Gastrointestinal: positive for abdominal pain  Physical Exam: There were no vitals filed for this visit.   General: Some distress secondary to being SOB HEENT: PERRLA, EOMI Neck: possible JVD- large neck Heart: RRR Lungs: dec BS at bases Abdomen:  distended- small open area in suprapubic area- very superficial- does not appear obviously infected  Extremities: pitting edema Skin: warm and dry Neuro: alert  Assessment/Plan: 30 year old kidney transplant pt who presents with volume overlaod - 20 pound weight gain in the setting of lyrica and bladder dysfunction  1.Renal- pt with known kidney transplant and some allograft nephropathy- creatinine has been as high as the high 1's.  It is occasionally better but uses IVF to get it down  2. Hypertension/volume  - Definitely appears volume overloaded by exam and 20 pound weight gain- said it all started with lyrica but also has some renal insufficiency and was on very low dose lasix as OP- 20 mg daily-  Would use foley cath and give IV lasix maybe 80 BID to start and monitor progress.  Would hold metoprolol in the setting of diuresis so BP does not drop.  Can also hold sodium bicarb which is not helping 3. Immune suppressing meds- continue home doses of cyclosporine/cellcept and prednisone 4.  Comfort meds-unfortunately pt is on a lot of comfort meds as well which will need to be continued- neurontin/clonopin/ambien and antidepressant 5. Anemia  - has not been a significant issue  6. Genitourinary sclerosis- does in and out caths at home- will need foley cath  Renal will follow with you- thank you hospitalists for your assist   Riyanna Crutchley A  04/24/2016, 5:31 PM

## 2016-04-25 ENCOUNTER — Encounter (HOSPITAL_COMMUNITY): Payer: Self-pay | Admitting: Internal Medicine

## 2016-04-25 DIAGNOSIS — N182 Chronic kidney disease, stage 2 (mild): Secondary | ICD-10-CM | POA: Diagnosis not present

## 2016-04-25 DIAGNOSIS — F909 Attention-deficit hyperactivity disorder, unspecified type: Secondary | ICD-10-CM | POA: Diagnosis present

## 2016-04-25 DIAGNOSIS — N319 Neuromuscular dysfunction of bladder, unspecified: Secondary | ICD-10-CM | POA: Diagnosis present

## 2016-04-25 DIAGNOSIS — Z87891 Personal history of nicotine dependence: Secondary | ICD-10-CM | POA: Diagnosis not present

## 2016-04-25 DIAGNOSIS — Z23 Encounter for immunization: Secondary | ICD-10-CM | POA: Diagnosis not present

## 2016-04-25 DIAGNOSIS — G4733 Obstructive sleep apnea (adult) (pediatric): Secondary | ICD-10-CM | POA: Diagnosis not present

## 2016-04-25 DIAGNOSIS — Z9181 History of falling: Secondary | ICD-10-CM | POA: Diagnosis not present

## 2016-04-25 DIAGNOSIS — E8770 Fluid overload, unspecified: Secondary | ICD-10-CM | POA: Diagnosis present

## 2016-04-25 DIAGNOSIS — D6851 Activated protein C resistance: Secondary | ICD-10-CM | POA: Diagnosis present

## 2016-04-25 DIAGNOSIS — F32A Depression, unspecified: Secondary | ICD-10-CM | POA: Diagnosis present

## 2016-04-25 DIAGNOSIS — G894 Chronic pain syndrome: Secondary | ICD-10-CM | POA: Diagnosis present

## 2016-04-25 DIAGNOSIS — Z79899 Other long term (current) drug therapy: Secondary | ICD-10-CM | POA: Diagnosis not present

## 2016-04-25 DIAGNOSIS — F329 Major depressive disorder, single episode, unspecified: Secondary | ICD-10-CM | POA: Diagnosis not present

## 2016-04-25 DIAGNOSIS — R0600 Dyspnea, unspecified: Secondary | ICD-10-CM | POA: Diagnosis not present

## 2016-04-25 DIAGNOSIS — I5032 Chronic diastolic (congestive) heart failure: Secondary | ICD-10-CM | POA: Diagnosis present

## 2016-04-25 DIAGNOSIS — Z940 Kidney transplant status: Secondary | ICD-10-CM | POA: Diagnosis not present

## 2016-04-25 DIAGNOSIS — E669 Obesity, unspecified: Secondary | ICD-10-CM | POA: Diagnosis present

## 2016-04-25 DIAGNOSIS — I10 Essential (primary) hypertension: Secondary | ICD-10-CM | POA: Diagnosis not present

## 2016-04-25 DIAGNOSIS — Z8249 Family history of ischemic heart disease and other diseases of the circulatory system: Secondary | ICD-10-CM | POA: Diagnosis not present

## 2016-04-25 DIAGNOSIS — Z9981 Dependence on supplemental oxygen: Secondary | ICD-10-CM | POA: Diagnosis not present

## 2016-04-25 DIAGNOSIS — G4459 Other complicated headache syndrome: Secondary | ICD-10-CM | POA: Diagnosis not present

## 2016-04-25 DIAGNOSIS — Z87440 Personal history of urinary (tract) infections: Secondary | ICD-10-CM | POA: Diagnosis not present

## 2016-04-25 DIAGNOSIS — G43811 Other migraine, intractable, with status migrainosus: Secondary | ICD-10-CM | POA: Diagnosis not present

## 2016-04-25 DIAGNOSIS — G932 Benign intracranial hypertension: Secondary | ICD-10-CM | POA: Diagnosis not present

## 2016-04-25 DIAGNOSIS — Z6834 Body mass index (BMI) 34.0-34.9, adult: Secondary | ICD-10-CM | POA: Diagnosis not present

## 2016-04-25 DIAGNOSIS — N183 Chronic kidney disease, stage 3 (moderate): Secondary | ICD-10-CM | POA: Diagnosis present

## 2016-04-25 DIAGNOSIS — Z6833 Body mass index (BMI) 33.0-33.9, adult: Secondary | ICD-10-CM | POA: Diagnosis not present

## 2016-04-25 DIAGNOSIS — E662 Morbid (severe) obesity with alveolar hypoventilation: Secondary | ICD-10-CM | POA: Diagnosis present

## 2016-04-25 DIAGNOSIS — G43909 Migraine, unspecified, not intractable, without status migrainosus: Secondary | ICD-10-CM | POA: Diagnosis present

## 2016-04-25 DIAGNOSIS — F418 Other specified anxiety disorders: Secondary | ICD-10-CM | POA: Diagnosis present

## 2016-04-25 DIAGNOSIS — Z8673 Personal history of transient ischemic attack (TIA), and cerebral infarction without residual deficits: Secondary | ICD-10-CM | POA: Diagnosis not present

## 2016-04-25 DIAGNOSIS — E6609 Other obesity due to excess calories: Secondary | ICD-10-CM | POA: Diagnosis not present

## 2016-04-25 DIAGNOSIS — I130 Hypertensive heart and chronic kidney disease with heart failure and stage 1 through stage 4 chronic kidney disease, or unspecified chronic kidney disease: Secondary | ICD-10-CM | POA: Diagnosis present

## 2016-04-25 DIAGNOSIS — Z8701 Personal history of pneumonia (recurrent): Secondary | ICD-10-CM | POA: Diagnosis not present

## 2016-04-25 DIAGNOSIS — E875 Hyperkalemia: Secondary | ICD-10-CM | POA: Diagnosis not present

## 2016-04-25 DIAGNOSIS — E7800 Pure hypercholesterolemia, unspecified: Secondary | ICD-10-CM | POA: Diagnosis present

## 2016-04-25 LAB — CBC
HCT: 39.3 % (ref 36.0–46.0)
Hemoglobin: 13.6 g/dL (ref 12.0–15.0)
MCH: 31 pg (ref 26.0–34.0)
MCHC: 34.6 g/dL (ref 30.0–36.0)
MCV: 89.5 fL (ref 78.0–100.0)
PLATELETS: 262 10*3/uL (ref 150–400)
RBC: 4.39 MIL/uL (ref 3.87–5.11)
RDW: 14.4 % (ref 11.5–15.5)
WBC: 8.1 10*3/uL (ref 4.0–10.5)

## 2016-04-25 LAB — RENAL FUNCTION PANEL
ALBUMIN: 3.5 g/dL (ref 3.5–5.0)
Anion gap: 12 (ref 5–15)
BUN: 36 mg/dL — ABNORMAL HIGH (ref 6–20)
CHLORIDE: 95 mmol/L — AB (ref 101–111)
CO2: 31 mmol/L (ref 22–32)
Calcium: 8.9 mg/dL (ref 8.9–10.3)
Creatinine, Ser: 2.22 mg/dL — ABNORMAL HIGH (ref 0.44–1.00)
GFR calc Af Amer: 33 mL/min — ABNORMAL LOW (ref >60)
GFR calc non Af Amer: 28 mL/min — ABNORMAL LOW (ref >60)
GLUCOSE: 122 mg/dL — AB (ref 65–99)
POTASSIUM: 3.6 mmol/L (ref 3.5–5.1)
Phosphorus: 3.6 mg/dL (ref 2.5–4.6)
Sodium: 138 mmol/L (ref 135–145)

## 2016-04-25 LAB — TSH: TSH: 3.89 u[IU]/mL (ref 0.350–4.500)

## 2016-04-25 MED ORDER — PHENAZOPYRIDINE HCL 200 MG PO TABS: 200.0000 mg | ORAL_TABLET | Freq: Three times a day (TID) | ORAL | Status: DC

## 2016-04-25 MED ORDER — HYDROCODONE-ACETAMINOPHEN 5-325 MG PO TABS
1.0000 | ORAL_TABLET | Freq: Four times a day (QID) | ORAL | Status: DC | PRN
  Administered 2016-04-25 – 2016-04-26 (×2): 2 via ORAL
  Filled 2016-04-25 (×2): qty 2

## 2016-04-25 MED ORDER — CYCLOBENZAPRINE HCL 5 MG PO TABS
5.0000 mg | ORAL_TABLET | Freq: Once | ORAL | Status: AC
  Administered 2016-04-25: 5 mg via ORAL
  Filled 2016-04-25: qty 1

## 2016-04-25 NOTE — Progress Notes (Signed)
[facility] PROGRESS NOTE  Donna Shannon ZOX:[id number] DOB: [birth year] DOA: 04/24/2016 PCP: No PCP Per Patient  Interim summary and HPI 30 y.o. 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   Patient reports that she weighed 160 in August.  Dr. Logan Bores started her on Lyrica for bladder pain (didn't want to be on narcotics anymore so only taking Ultram and Lyrica) - in just 2 months she was up to 200 pounds.  He stopped the Lyrica, now on Gabapentin.  Also came off of Depo Provera because of weight (went on Forteo due to osteoporosis in the spine).  Here because she is so fluid overloaded that she can't breathe, has to use O2 at night and thinks she needs it all day too.  Wheezing, cough, can't go up.down stairs.  Symptoms since September.  Assessment/Plan: Volume overload and SOB -Patient with h/o kidney transplant -still with volume overload on exam -Has not responded to 20mg  or 60mg  PO Lasix -Patient admitted for IV diuresis; receiving 80mg  BID Lasix -Nephrology is following and providing rec's -will also get an Echo  Obesity -While volume overload is likely contributing, I suspect that her weight is also due to medication side effects (Depo, Lyrica/gabapentin, steroids) and that some of what will be required is low calorie diet/exercise -Her respiratory compromise/need for Albuterol and nighttime O2 is likely OSA/OHS related to rapid weight gain and her overall body habitus -will benefit of outpatient sleep study -nutrition assessment requested  -TSH WNL  S/p renal transplant -Continue rejection medications -per renal will check cyclosporine level -Current creatinine is indicative of mild to moderate CKD, no current concern for volume deficiency  -will hold  IVF's -continue diuresis -will follow renal service rec's -good diuresis   Neurogenic bladder -Has bladder pacemaker -I/O cath at home -foley placed on admission based on projected aggressive diuresis -Continue home meds and outpatient follow up with urology service as an outpatient  Depression (with anxiety and hx of ADHD) -Continue Klonopin, Zoloft and Ambien -Holding Ritalin - should not require it as inpatient  Chronic pain syndrome -will continue PRN home pain meds  HTN -BP stable -will continue holding lopressor -IV diuretics also controlling BP -heart healthy diet and low sodium discussed with patient  Code Status: full Family Communication: no family at bedside Disposition Plan: home when medically stable. Still needs further diuresis and her renal function is trending up. Will follow up renal rec's. Will require more than 2 midnights to stabilizes her condition and with high risk for decompensation giving hx of renal transplant and use of immunosuppressants    Consultants:  Renal service   Procedures:  See below for x-ray reports   Antibiotics:  None   HPI/Subjective: Afebrile, denies CP and SOB. Still with signs of fluid overload; good diuresis. Cr level up today.  Objective: Vitals:   04/25/16 1733 04/25/16 2056  BP: 137/89 111/75  Pulse:  100  Resp:  15  Temp:  98.3 F (36.8 C)    Intake/Output Summary (Last 24 hours) at 04/25/16 2213 Last data filed at 04/25/16 2204  Gross per 24 hour  Intake              962 ml  Output             3580 ml  Net            -  2618 ml   There were no vitals filed for this visit.  Exam:   General:  Afebrile, in no acute distress, overweight on exam, with signs of fluid overload. Denies CP and SOB.  Cardiovascular: no rubs, no murmurs, no gallops. S1 and S2  Respiratory: good air movement, no wheezing, no frank crackles appreciated   Abdomen: soft, NT, positive BS  Musculoskeletal: no joint swelling,  FROM. Positive 2 ++ edema.  Data Reviewed: Basic Metabolic Panel:  Recent Labs Lab 04/25/16 0157  NA 138  K 3.6  CL 95*  CO2 31  GLUCOSE 122*  BUN 36*  CREATININE 2.22*  CALCIUM 8.9  PHOS 3.6   Liver Function Tests:  Recent Labs Lab 04/25/16 0157  ALBUMIN 3.5   CBC:  Recent Labs Lab 04/25/16 0157  WBC 8.1  HGB 13.6  HCT 39.3  MCV 89.5  PLT 262   Cardiac Enzymes: No results for input(s): CKTOTAL, CKMB, CKMBINDEX, TROPONINI in the last 168 hours.   Studies: No results found.  Scheduled Meds: . allopurinol  100 mg Oral BID  . aspirin  81 mg Oral Daily  . clonazePAM  0.5 mg Oral Q24H  . clonazePAM  1 mg Oral BID  . cycloSPORINE modified  100 mg Oral BID  . cycloSPORINE modified  25 mg Oral Daily  . enoxaparin (LOVENOX) injection  40 mg Subcutaneous Q24H  . famotidine  20 mg Oral Daily  . fenofibrate  160 mg Oral Daily  . flavoxATE  100 mg Oral TID  . furosemide  80 mg Intravenous Q12H  . gabapentin  300 mg Oral TID  . gemfibrozil  600 mg Oral BID AC  . levETIRAcetam  500 mg Oral BID  . magnesium oxide  800 mg Oral BID  . mycophenolate  500 mg Oral BID  . norethindrone  1 tablet Oral Daily  . omega-3 acid ethyl esters  1 g Oral TID  . pravastatin  40 mg Oral QHS  . predniSONE  2.5 mg Oral Q breakfast  . sertraline  100 mg Oral BID  . Teriparatide (Recombinant)  20 mcg Subcutaneous Daily   Continuous Infusions: . sodium chloride 10 mL/hr at 04/25/16 2156    Principal Problem:   Volume overload Active Problems:   Obesity   Renal transplant recipient   Depression   Neurogenic bladder   Essential hypertension   CKD (chronic kidney disease) stage 2, GFR 60-89 ml/min    Time spent: 30 minutes    Donna LollMadera, Donna Shannon  [facility] Pager [phone]. If 7PM-7AM, please contact night-coverage at [email or web address], password [email or web address] 04/25/2016, 10:13 PM  LOS: 1 day

## 2016-04-25 NOTE — Progress Notes (Addendum)
Nutrition Consult/Brief Note   Pt reports having a good appetite currently and PTA with usual intake of at least 3 meals a day with no other difficulties. Current diet order is heart healthy, patient is consuming approximately 75-90% of meals at this time. Pt currently has Nepro Shake ordered TID PRN. RD to continue with current orders. Pt educated on the importance of adequate protein intake in the diet. Pt expressed understanding. Pt reports current weight of ~210 lbs. Pt volume overloaded. Pt reports weighing ~160 lbs in August 2017. Pt currently diuresing. No further nutrition interventions warranted at this time. Labs and medications reviewed. If nutrition issues arise, please consult RD.   Roslyn SmilingStephanie Rubyann Lingle, MS, RD, LDN Pager # [phone] After hours/ weekend pager # [phone]

## 2016-04-25 NOTE — Care Management Obs Status (Signed)
MEDICARE OBSERVATION STATUS NOTIFICATION   Patient Details  Name: Donna Shannon MRN: [id number] Date of Birth: 11/03/1985   Medicare Observation Status Notification Given:  Yes    Claressa Hughley, Annamarie MajorCheryl U, RN 04/25/2016, 12:11 PM

## 2016-04-25 NOTE — Progress Notes (Signed)
[facility] ROUNDING NOTE   Subjective:   Interval History:  Appears to be diuresing well this am with IV lasix  Objective:  Vital signs in last 24 hours:  Temp:  [97.4 F (36.3 C)-99.4 F (37.4 C)] 97.4 F (36.3 C) (10/18 0956) Pulse Rate:  [88-99] 88 (10/18 0956) Resp:  [16-18] 16 (10/18 0956) BP: (107-129)/(74-82) 107/74 (10/18 0956) SpO2:  [97 %-99 %] 97 % (10/18 0956)  Weight change:  There were no vitals filed for this visit.  Intake/Output: I/O last 3 completed shifts: In: 524 [P.O.:444; I.V.:80] Out: 1530 [Urine:1530]   Intake/Output this shift:  No intake/output data recorded.  Obese with thick obese neck  CVS- RRR RS- CTA anteriorly  ABD-  Distended with small open area in suprapubic area EXT-  Pitting edema   Basic Metabolic Panel:  Recent Labs Lab 04/25/16 0157  NA 138  K 3.6  CL 95*  CO2 31  GLUCOSE 122*  BUN 36*  CREATININE 2.22*  CALCIUM 8.9  PHOS 3.6    Liver Function Tests:  Recent Labs Lab 04/25/16 0157  ALBUMIN 3.5   No results for input(s): LIPASE, AMYLASE in the last 168 hours. No results for input(s): AMMONIA in the last 168 hours.  CBC:  Recent Labs Lab 04/25/16 0157  WBC 8.1  HGB 13.6  HCT 39.3  MCV 89.5  PLT 262    Cardiac Enzymes: No results for input(s): CKTOTAL, CKMB, CKMBINDEX, TROPONINI in the last 168 hours.  BNP: Invalid input(s): POCBNP  CBG: No results for input(s): GLUCAP in the last 168 hours.  Microbiology: No results found for this or any previous visit.  Coagulation Studies: No results for input(s): LABPROT, INR in the last 72 hours.  Urinalysis: No results for input(s): COLORURINE, LABSPEC, PHURINE, GLUCOSEU, HGBUR, BILIRUBINUR, KETONESUR, PROTEINUR, UROBILINOGEN, NITRITE, LEUKOCYTESUR in the last 72 hours.  Invalid input(s): APPERANCEUR    Imaging: No results found.   Medications:   . sodium chloride 10 mL/hr at 04/24/16 2200   . allopurinol  100 mg Oral BID   . aspirin  81 mg Oral Daily  . clonazePAM  0.5 mg Oral Q24H  . clonazePAM  1 mg Oral BID  . cycloSPORINE modified  100 mg Oral BID  . cycloSPORINE modified  25 mg Oral Daily  . enoxaparin (LOVENOX) injection  40 mg Subcutaneous Q24H  . famotidine  20 mg Oral Daily  . fenofibrate  160 mg Oral Daily  . flavoxATE  100 mg Oral TID  . furosemide  80 mg Intravenous Q12H  . gabapentin  300 mg Oral TID  . gemfibrozil  600 mg Oral BID AC  . Influenza vac split quadrivalent PF  0.5 mL Intramuscular Tomorrow-1000  . levETIRAcetam  500 mg Oral BID  . magnesium oxide  800 mg Oral BID  . mycophenolate  500 mg Oral BID  . norethindrone  1 tablet Oral Daily  . omega-3 acid ethyl esters  1 g Oral TID  . pravastatin  40 mg Oral QHS  . predniSONE  2.5 mg Oral Q breakfast  . sertraline  100 mg Oral BID  . Teriparatide (Recombinant)  20 mcg Subcutaneous Daily   acetaminophen **OR** acetaminophen, calcium carbonate (dosed in mg elemental calcium), camphor-menthol **AND** hydrOXYzine, docusate sodium, feeding supplement (NEPRO CARB STEADY), HYDROcodone-acetaminophen, ondansetron **OR** ondansetron (ZOFRAN) IV, prochlorperazine, promethazine, sodium chloride flush, sorbitol, traMADol, zolpidem  Assessment/ Plan:  30 year old kidney transplant pt who presents with volume overlaod - 20 pound weight gain  in the setting of lyrica and bladder dysfunction  1.Renal- pt with known kidney transplant and some allograft nephropathy- creatinine has been as high as the high 1's.  It is occasionally better but uses IVF to get it down  Now at 2.2 will check cyclosporine levers  2. Hypertension/volume  - will continue with diuresis 3. Immune suppressing meds- continue home doses of cyclosporine/cellcept and prednisone 4.  Comfort meds-unfortunately pt is on a lot of comfort meds as well which will need to be continued- neurontin/clonopin/ambien and antidepressant 5. Anemia  - has not been a significant issue  6.  Genitourinary sclerosis- does in and out caths at home- will need foley cath  Nothing to really add today  -- reasonable to check cyclo level but not really toxic    LOS: 1 Juno Alers W @TODAY @10 :54 AM

## 2016-04-25 NOTE — Consult Note (Addendum)
WOC Nurse wound consult note Reason for Consult: Consult requested for abd wound.  Pt states she had surgery to this site several years ago and one site will heal, then periodically re-open. Wound type: Chronic full thickness wound to lower abd, surrounded by scar tissue. Measurement: 1X2X.1cm Wound bed: moist red Drainage (amount, consistency, odor) small amt yellow drainage, no odor Periwound: Intact skin surrounding Dressing procedure/placement/frequency: Foam dressing to protect from further injury and promote healing. Discussed plan of care with patient and she verbalized understanding. Please re-consult if further assistance is needed.  Thank-you,  Cammie Mcgeeawn Johnnell Liou MSN, RN, CWOCN, [place]ers[place]CN-AP, CNS [phone]

## 2016-04-26 LAB — BASIC METABOLIC PANEL
ANION GAP: 12 (ref 5–15)
BUN: 49 mg/dL — ABNORMAL HIGH (ref 6–20)
CHLORIDE: 93 mmol/L — AB (ref 101–111)
CO2: 33 mmol/L — ABNORMAL HIGH (ref 22–32)
Calcium: 8.7 mg/dL — ABNORMAL LOW (ref 8.9–10.3)
Creatinine, Ser: 2.86 mg/dL — ABNORMAL HIGH (ref 0.44–1.00)
GFR calc Af Amer: 24 mL/min — ABNORMAL LOW (ref >60)
GFR calc non Af Amer: 21 mL/min — ABNORMAL LOW (ref >60)
Glucose, Bld: 110 mg/dL — ABNORMAL HIGH (ref 65–99)
Potassium: 4.6 mmol/L (ref 3.5–5.1)
Sodium: 138 mmol/L (ref 135–145)

## 2016-04-26 MED ORDER — MORPHINE SULFATE (PF) 2 MG/ML IV SOLN
2.0000 mg | Freq: Four times a day (QID) | INTRAVENOUS | Status: DC | PRN
  Administered 2016-04-26 – 2016-04-30 (×4): 2 mg via INTRAVENOUS
  Filled 2016-04-26 (×4): qty 1

## 2016-04-26 MED ORDER — OXYBUTYNIN CHLORIDE 5 MG PO TABS
5.0000 mg | ORAL_TABLET | Freq: Three times a day (TID) | ORAL | Status: DC | PRN
  Administered 2016-04-26 – 2016-04-28 (×3): 5 mg via ORAL
  Filled 2016-04-26 (×3): qty 1

## 2016-04-26 MED ORDER — ALTEPLASE 2 MG IJ SOLR
2.0000 mg | Freq: Once | INTRAMUSCULAR | Status: AC
  Administered 2016-04-26: 2 mg
  Filled 2016-04-26: qty 2

## 2016-04-26 MED ORDER — FUROSEMIDE 40 MG PO TABS
40.0000 mg | ORAL_TABLET | Freq: Every day | ORAL | Status: DC
  Administered 2016-04-26 – 2016-04-30 (×5): 40 mg via ORAL
  Filled 2016-04-26 (×5): qty 1

## 2016-04-26 MED ORDER — VALPROATE SODIUM 500 MG/5ML IV SOLN
500.0000 mg | Freq: Once | INTRAVENOUS | Status: AC
  Administered 2016-04-26: 500 mg via INTRAVENOUS
  Filled 2016-04-26: qty 5

## 2016-04-26 NOTE — Progress Notes (Signed)
[facility] PROGRESS NOTE  Terriona Horlacher ZOX:[id number] DOB: 11-04-1985 DOA: 04/24/2016 PCP: No PCP Per Patient  Interim summary and HPI 30 y.o. 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   Patient reports that she weighed 160 in August.  Dr. Logan Bores started her on Lyrica for bladder pain (didn't want to be on narcotics anymore so only taking Ultram and Lyrica) - in just 2 months she was up to 200 pounds.  He stopped the Lyrica, now on Gabapentin.  Also came off of Depo Provera because of weight (went on Forteo due to osteoporosis in the spine).  Here because she is so fluid overloaded that she can't breathe, has to use O2 at night and thinks she needs it all day too.  Wheezing, cough, can't go up.down stairs.  Symptoms since September.  Assessment/Plan: Volume overload and SOB -Patient with h/o kidney transplant -still with volume overload on exam; but much improved. -Has not responded to 20mg  or 60mg  PO Lasix -Patient admitted for IV diuresis; received 80mg  BID Lasix X 4 doses. Now Cr up to 2.86; per renal rec's has been switched to 40mg  BID by mouth. -Nephrology is following her and will follow rec's -will also follow Echo results, ordered and pending   Obesity -While volume overload is likely contributing, I suspect that her weight is also due to medication side effects (Depo, Lyrica/gabapentin, steroids) and that some of what will be required is low calorie diet/exercise -Her respiratory compromise/need for Albuterol and nighttime O2 is likely OSA/OHS related to rapid weight gain and her overall body habitus -will benefit of outpatient sleep study -nutrition assessment requested  -TSH WNL  S/p renal transplant -Continue rejection medications -per renal will check  cyclosporine level -Current creatinine is indicative of mild to moderate CKD, no current concern for volume deficiency.   -will hold IVF's for now -continue diuresis -will follow renal service rec's -good diuresis has continued  Neurogenic bladder -Has bladder pacemaker -I/O cath at home -foley placed on admission based on projected aggressive diuresis -Continue home meds and outpatient follow up with urology service as an outpatient -will add ditropan for bladder spasm   Depression (with anxiety and hx of ADHD) -Continue Klonopin, Zoloft and Ambien -Holding Ritalin - should not require it as inpatient  Chronic pain syndrome -will continue PRN home pain meds -ditropan PRN for bladder spasm and morphine for severe pain   HTN -BP stable -will continue holding lopressor -diuretics also controlling BP -heart healthy diet and low sodium discussed with patient  Migraines -will give depakote 500mg  IV X 1 -avoid the use of narcotics if possible  Code Status: full Family Communication: no family at bedside Disposition Plan: home when medically stable. Still needs further diuresis and her renal function is trending up. Will follow up renal rec's. Will require more than 2 midnights to stabilizes her condition and with high risk for decompensation giving hx of renal transplant and use of immunosuppressants    Consultants:  Renal service   Procedures:  See below for x-ray reports   Antibiotics:  None   HPI/Subjective: Afebrile, denies CP and SOB. Still with signs of fluid overload, but much improved. reports HA and severe bladder spasms. Cr level even higher today.  Objective: Vitals:   04/26/16 0528 04/26/16 0954  BP: 124/75 121/71  Pulse: (!) 111 (!)  120  Resp: 16 18  Temp: 98.5 F (36.9 C) 97.6 F (36.4 C)    Intake/Output Summary (Last 24 hours) at 04/26/16 1433 Last data filed at 04/26/16 0955  Gross per 24 hour  Intake              540 ml  Output              2325 ml  Net            -1785 ml   There were no vitals filed for this visit.  Exam:   General:  Afebrile, in distress secondary to bladder spasm and migraine. No CP and reports breathing to be ok. Patient with improvement in her volume status.   Cardiovascular: no rubs, no murmurs, no gallops. S1 and S2  Respiratory: good air movement, no wheezing, no frank crackles appreciated   Abdomen: soft, NT, positive BS  Musculoskeletal: no joint swelling, FROM. Positive 1-2 ++ edema.  Data Reviewed: Basic Metabolic Panel:  Recent Labs Lab 04/25/16 0157 04/26/16 0545  NA 138 138  K 3.6 4.6  CL 95* 93*  CO2 31 33*  GLUCOSE 122* 110*  BUN 36* 49*  CREATININE 2.22* 2.86*  CALCIUM 8.9 8.7*  PHOS 3.6  --    Liver Function Tests:  Recent Labs Lab 04/25/16 0157  ALBUMIN 3.5   CBC:  Recent Labs Lab 04/25/16 0157  WBC 8.1  HGB 13.6  HCT 39.3  MCV 89.5  PLT 262   Studies: No results found.  Scheduled Meds: . allopurinol  100 mg Oral BID  . alteplase  2 mg Intracatheter Once  . aspirin  81 mg Oral Daily  . clonazePAM  0.5 mg Oral Q24H  . clonazePAM  1 mg Oral BID  . cycloSPORINE modified  100 mg Oral BID  . cycloSPORINE modified  25 mg Oral Daily  . enoxaparin (LOVENOX) injection  40 mg Subcutaneous Q24H  . famotidine  20 mg Oral Daily  . fenofibrate  160 mg Oral Daily  . flavoxATE  100 mg Oral TID  . furosemide  40 mg Oral Daily  . gabapentin  300 mg Oral TID  . gemfibrozil  600 mg Oral BID AC  . levETIRAcetam  500 mg Oral BID  . magnesium oxide  800 mg Oral BID  . mycophenolate  500 mg Oral BID  . norethindrone  1 tablet Oral Daily  . omega-3 acid ethyl esters  1 g Oral TID  . pravastatin  40 mg Oral QHS  . predniSONE  2.5 mg Oral Q breakfast  . sertraline  100 mg Oral BID  . Teriparatide (Recombinant)  20 mcg Subcutaneous Daily  . valproate sodium  500 mg Intravenous Once   Continuous Infusions: . sodium chloride 10 mL/hr at 04/25/16 2156     Principal Problem:   Volume overload Active Problems:   Obesity   Renal transplant recipient   Depression   Neurogenic bladder   Essential hypertension   CKD (chronic kidney disease) stage 2, GFR 60-89 ml/min    Time spent: 30 minutes    Vassie LollMadera, Khush Pasion  [facility] Pager [phone]. If 7PM-7AM, please contact night-coverage at [email or web address], password [email or web address] 04/26/2016, 2:33 PM  LOS: 2 days

## 2016-04-26 NOTE — Progress Notes (Signed)
[facility] ROUNDING NOTE   Subjective:   Interval History:  Lying in bed     snoring loudly   Objective:  Vital signs in last 24 hours:  Temp:  [97.4 F (36.3 C)-98.9 F (37.2 C)] 98.5 F (36.9 C) (10/19 0528) Pulse Rate:  [88-111] 111 (10/19 0528) Resp:  [14-16] 16 (10/19 0528) BP: (107-137)/(74-100) 124/75 (10/19 0528) SpO2:  [94 %-100 %] 100 % (10/19 0528)  Weight change:  There were no vitals filed for this visit.  Intake/Output: I/O last 3 completed shifts: In: 1484 [P.O.:1164; I.V.:320] Out: 4230 [Urine:4230]   Intake/Output this shift:  No intake/output data recorded.  Obese with thick obese neck  CVS- RRR RS- CTA anteriorly  ABD-  Distended with small open area in suprapubic area EXT-   no edema   Basic Metabolic Panel:  Recent Labs Lab 04/25/16 0157 04/26/16 0545  NA 138 138  K 3.6 4.6  CL 95* 93*  CO2 31 33*  GLUCOSE 122* 110*  BUN 36* 49*  CREATININE 2.22* 2.86*  CALCIUM 8.9 8.7*  PHOS 3.6  --     Liver Function Tests:  Recent Labs Lab 04/25/16 0157  ALBUMIN 3.5   No results for input(s): LIPASE, AMYLASE in the last 168 hours. No results for input(s): AMMONIA in the last 168 hours.  CBC:  Recent Labs Lab 04/25/16 0157  WBC 8.1  HGB 13.6  HCT 39.3  MCV 89.5  PLT 262    Cardiac Enzymes: No results for input(s): CKTOTAL, CKMB, CKMBINDEX, TROPONINI in the last 168 hours.  BNP: Invalid input(s): POCBNP  CBG: No results for input(s): GLUCAP in the last 168 hours.  Microbiology: No results found for this or any previous visit.  Coagulation Studies: No results for input(s): LABPROT, INR in the last 72 hours.  Urinalysis: No results for input(s): COLORURINE, LABSPEC, PHURINE, GLUCOSEU, HGBUR, BILIRUBINUR, KETONESUR, PROTEINUR, UROBILINOGEN, NITRITE, LEUKOCYTESUR in the last 72 hours.  Invalid input(s): APPERANCEUR    Imaging: No results found.   Medications:   . sodium chloride 10 mL/hr at 04/25/16  2156   . allopurinol  100 mg Oral BID  . aspirin  81 mg Oral Daily  . clonazePAM  0.5 mg Oral Q24H  . clonazePAM  1 mg Oral BID  . cycloSPORINE modified  100 mg Oral BID  . cycloSPORINE modified  25 mg Oral Daily  . enoxaparin (LOVENOX) injection  40 mg Subcutaneous Q24H  . famotidine  20 mg Oral Daily  . fenofibrate  160 mg Oral Daily  . flavoxATE  100 mg Oral TID  . furosemide  40 mg Oral Daily  . gabapentin  300 mg Oral TID  . gemfibrozil  600 mg Oral BID AC  . levETIRAcetam  500 mg Oral BID  . magnesium oxide  800 mg Oral BID  . mycophenolate  500 mg Oral BID  . norethindrone  1 tablet Oral Daily  . omega-3 acid ethyl esters  1 g Oral TID  . pravastatin  40 mg Oral QHS  . predniSONE  2.5 mg Oral Q breakfast  . sertraline  100 mg Oral BID  . Teriparatide (Recombinant)  20 mcg Subcutaneous Daily   acetaminophen **OR** acetaminophen, calcium carbonate (dosed in mg elemental calcium), camphor-menthol **AND** hydrOXYzine, docusate sodium, feeding supplement (NEPRO CARB STEADY), HYDROcodone-acetaminophen, ondansetron **OR** ondansetron (ZOFRAN) IV, prochlorperazine, promethazine, sodium chloride flush, sorbitol, traMADol, zolpidem  Assessment/ Plan:  31 year old kidney transplant pt who presents with volume overlaod - 20 pound  weight gain in the setting of lyrica and bladder dysfunction  1.Renal-pt with known kidney transplant and some allograft nephropathy- creatinine has been as high as the high 1's. It is occasionally better but uses IVF to get it down  Now at 2.8 will check cyclosporine levers  2. Hypertension/volume- Change to oral furosemide 3. Immune suppressing meds- continue home doses of cyclosporine/cellcept and prednisone 4. Comfort meds-unfortunately pt is on a lot of comfort meds as well which will need to be continued- neurontin/clonopin/ambien and antidepressant 5. Anemia- has not been a significant issue  6. Genitourinary sclerosis- does in and out caths at  home- will need foley cath  Will change from IV Lasix to oral lasix      LOS: 2 Donna Shannon W @TODAY @8 :18 AM

## 2016-04-27 ENCOUNTER — Other Ambulatory Visit (HOSPITAL_COMMUNITY): Payer: Medicare Other

## 2016-04-27 DIAGNOSIS — E875 Hyperkalemia: Secondary | ICD-10-CM

## 2016-04-27 LAB — RENAL FUNCTION PANEL
ALBUMIN: 3.5 g/dL (ref 3.5–5.0)
Anion gap: 11 (ref 5–15)
BUN: 53 mg/dL — AB (ref 6–20)
CHLORIDE: 98 mmol/L — AB (ref 101–111)
CO2: 29 mmol/L (ref 22–32)
Calcium: 8.5 mg/dL — ABNORMAL LOW (ref 8.9–10.3)
Creatinine, Ser: 3.08 mg/dL — ABNORMAL HIGH (ref 0.44–1.00)
GFR, EST AFRICAN AMERICAN: 22 mL/min — AB (ref >60)
GFR, EST NON AFRICAN AMERICAN: 19 mL/min — AB (ref >60)
Glucose, Bld: 157 mg/dL — ABNORMAL HIGH (ref 65–99)
PHOSPHORUS: 2.4 mg/dL — AB (ref 2.5–4.6)
POTASSIUM: 5.6 mmol/L — AB (ref 3.5–5.1)
Sodium: 138 mmol/L (ref 135–145)

## 2016-04-27 LAB — CYCLOSPORINE: Cyclosporine, LabCorp: 93 ng/mL — ABNORMAL LOW (ref 100–400)

## 2016-04-27 MED ORDER — BUTALBITAL-APAP-CAFFEINE 50-325-40 MG PO TABS
2.0000 | ORAL_TABLET | Freq: Once | ORAL | Status: AC
Start: 2016-04-27 — End: 2016-04-27
  Administered 2016-04-27: 2 via ORAL
  Filled 2016-04-27: qty 2

## 2016-04-27 MED ORDER — SODIUM POLYSTYRENE SULFONATE 15 GM/60ML PO SUSP
30.0000 g | Freq: Once | ORAL | Status: AC
  Administered 2016-04-27: 30 g via ORAL
  Filled 2016-04-27: qty 120

## 2016-04-27 MED ORDER — TERIPARATIDE (RECOMBINANT) 600 MCG/2.4ML ~~LOC~~ SOLN
20.0000 ug | Freq: Every day | SUBCUTANEOUS | Status: DC
  Administered 2016-04-27 – 2016-04-30 (×4): 20 ug via SUBCUTANEOUS
  Filled 2016-04-27 (×2): qty 0.08

## 2016-04-27 MED ORDER — VALPROATE SODIUM 500 MG/5ML IV SOLN
500.0000 mg | Freq: Once | INTRAVENOUS | Status: AC
  Administered 2016-04-27: 500 mg via INTRAVENOUS
  Filled 2016-04-27: qty 5

## 2016-04-27 MED ORDER — SUMATRIPTAN SUCCINATE 6 MG/0.5ML ~~LOC~~ SOLN
6.0000 mg | Freq: Once | SUBCUTANEOUS | Status: AC
Start: 2016-04-27 — End: 2016-04-27
  Administered 2016-04-27: 6 mg via SUBCUTANEOUS
  Filled 2016-04-27: qty 0.5

## 2016-04-27 NOTE — Care Management Important Message (Signed)
Important Message  Patient Details  Name: Felipa EvenerStephanie Barstow MRN: [id number] Date of Birth: 09/13/1985   Medicare Important Message Given:  Yes    Alizea Pell 04/27/2016, 4:05 PM

## 2016-04-27 NOTE — Progress Notes (Signed)
[facility] ROUNDING NOTE   Subjective:   Interval History: no complaints   Objective:  Vital signs in last 24 hours:  Temp:  [98.3 F (36.8 C)-98.7 F (37.1 C)] 98.7 F (37.1 C) (10/20 0850) Pulse Rate:  [120-122] 120 (10/20 0850) Resp:  [19-20] 20 (10/20 0850) BP: (109-152)/(60-94) 127/77 (10/20 0850) SpO2:  [100 %] 100 % (10/20 0850) Weight:  [98.6 kg (217 lb 6 oz)] 98.6 kg (217 lb 6 oz) (10/19 2031)  Weight change:  Filed Weights   04/26/16 2031  Weight: 98.6 kg (217 lb 6 oz)    Intake/Output: I/O last 3 completed shifts: In: 1160 [P.O.:920; I.V.:240] Out: 3725 [Urine:3725]   Intake/Output this shift:  Total I/O In: 840 [P.O.:840] Out: 0   Obese with thick obese neck  CVS- RRR RS- CTA anteriorly  ABD- Distended with small open area in suprapubic area EXT-  no edema   Basic Metabolic Panel:  Recent Labs Lab 04/25/16 0157 04/26/16 0545 04/27/16 1125  NA 138 138 138  K 3.6 4.6 5.6*  CL 95* 93* 98*  CO2 31 33* 29  GLUCOSE 122* 110* 157*  BUN 36* 49* 53*  CREATININE 2.22* 2.86* 3.08*  CALCIUM 8.9 8.7* 8.5*  PHOS 3.6  --  2.4*    Liver Function Tests:  Recent Labs Lab 04/25/16 0157 04/27/16 1125  ALBUMIN 3.5 3.5   No results for input(s): LIPASE, AMYLASE in the last 168 hours. No results for input(s): AMMONIA in the last 168 hours.  CBC:  Recent Labs Lab 04/25/16 0157  WBC 8.1  HGB 13.6  HCT 39.3  MCV 89.5  PLT 262    Cardiac Enzymes: No results for input(s): CKTOTAL, CKMB, CKMBINDEX, TROPONINI in the last 168 hours.  BNP: Invalid input(s): POCBNP  CBG: No results for input(s): GLUCAP in the last 168 hours.  Microbiology: No results found for this or any previous visit.  Coagulation Studies: No results for input(s): LABPROT, INR in the last 72 hours.  Urinalysis: No results for input(s): COLORURINE, LABSPEC, PHURINE, GLUCOSEU, HGBUR, BILIRUBINUR, KETONESUR, PROTEINUR, UROBILINOGEN, NITRITE, LEUKOCYTESUR  in the last 72 hours.  Invalid input(s): APPERANCEUR    Imaging: No results found.   Medications:   . sodium chloride 10 mL/hr at 04/25/16 2156   . allopurinol  100 mg Oral BID  . aspirin  81 mg Oral Daily  . clonazePAM  0.5 mg Oral Q24H  . clonazePAM  1 mg Oral BID  . cycloSPORINE modified  100 mg Oral BID  . cycloSPORINE modified  25 mg Oral Daily  . enoxaparin (LOVENOX) injection  40 mg Subcutaneous Q24H  . famotidine  20 mg Oral Daily  . fenofibrate  160 mg Oral Daily  . flavoxATE  100 mg Oral TID  . furosemide  40 mg Oral Daily  . gabapentin  300 mg Oral TID  . gemfibrozil  600 mg Oral BID AC  . levETIRAcetam  500 mg Oral BID  . magnesium oxide  800 mg Oral BID  . mycophenolate  500 mg Oral BID  . norethindrone  1 tablet Oral Daily  . omega-3 acid ethyl esters  1 g Oral TID  . pravastatin  40 mg Oral QHS  . predniSONE  2.5 mg Oral Q breakfast  . sertraline  100 mg Oral BID  . Teriparatide (Recombinant)  20 mcg Subcutaneous Daily   acetaminophen **OR** acetaminophen, calcium carbonate (dosed in mg elemental calcium), camphor-menthol **AND** hydrOXYzine, docusate sodium, feeding supplement (NEPRO CARB STEADY), morphine  injection, ondansetron **OR** ondansetron (ZOFRAN) IV, oxybutynin, prochlorperazine, promethazine, sodium chloride flush, sorbitol, traMADol, zolpidem  Assessment/ Plan:  30 year old kidney transplant pt who presents with volume overlaod - 20 pound weight gain in the setting of lyrica and bladder dysfunction  1.Renal-pt with known kidney transplant and some allograft nephropathy- creatinine has been as high as the high 1's.  Creatinine has risen in hospital presumably from volume changes with diuresis will continue to follow. Her cyclosporin level was 92  2. Hypertension/volume- Changed to oral furosemide 3. Immune suppressing meds- continue home doses of cyclosporine/cellcept and prednisone 4. Comfort meds-unfortunately pt is on a lot of comfort meds  as well which will need to be continued- neurontin/clonopin/ambien and antidepressant 5. Anemia- has not been a significant issue  6. Genitourinary sclerosis- does in and out caths at home- will need foley cath 7. Hyperkalemia will give kayexalate      LOS: 3 Radwan Cowley W @TODAY @1 :19 PM

## 2016-04-27 NOTE — Progress Notes (Signed)
[facility] PROGRESS NOTE  Donna EvenerStephanie Pfost WUJ:[id number] DOB: 02/10/1986 DOA: 04/24/2016 PCP: No PCP Per Patient  Interim summary and HPI 30 y.o. 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   Patient reports that she weighed 160 in August.  Dr. Logan BoresEvans started her on Lyrica for bladder pain (didn't want to be on narcotics anymore so only taking Ultram and Lyrica) - in just 2 months she was up to 200 pounds.  He stopped the Lyrica, now on Gabapentin.  Also came off of Depo Provera because of weight (went on Forteo due to osteoporosis in the spine).  Here because she is so fluid overloaded that she can't breathe, has to use O2 at night and thinks she needs it all day too.  Wheezing, cough, can't go up.down stairs.  Symptoms since September.  Assessment/Plan: Volume overload and SOB -Patient with h/o kidney transplant -still with volume overload on exam; but much improved. -Has not responded to 20mg  or 60mg  PO Lasix -Patient admitted for IV diuresis; received 80mg  BID Lasix X 4 doses. Now Cr up to 3.08; per renal rec's has been switched to 40mg  mouth daily now. -Nephrology is following her and will follow rec's -will also follow Echo results, ordered and pending   Obesity -While volume overload is likely contributing, I suspect that her weight is also due to medication side effects (Depo, Lyrica/gabapentin, steroids) and that some of what will be required is low calorie diet/exercise -Her respiratory compromise/need for Albuterol and nighttime O2 is likely OSA/OHS related to rapid weight gain and her overall body habitus -will benefit of outpatient sleep study -nutrition assessment requested  -TSH WNL  S/p renal transplant -Continue rejection medications -per renal will  cyclosporine level has been checked and is 93 -Current creatinine is indicative of mild to moderate CKD, no current concern for volume deficiency.   -furosemide change to 40mg  daily now -will follow renal service rec's -good diuresis has continued  Neurogenic bladder -Has bladder pacemaker -I/O cath at home -foley placed on admission based on projected aggressive diuresis -Continue home meds and outpatient follow up with urology service as an outpatient -will add ditropan for bladder spasm   Depression (with anxiety and hx of ADHD) -Continue Klonopin, Zoloft and Ambien -Holding Ritalin - should not require it as inpatient  Chronic pain syndrome -will continue PRN home pain meds -ditropan PRN for bladder spasm and morphine for severe pain   HTN -BP stable -will continue holding lopressor -diuretics also controlling BP -heart healthy diet and low sodium discussed with patient  Migraines -will give depakote 500mg  IV X 1 again -avoid the use of narcotics if possible -imitrex also given -will use empiric CPAP overnight   Hyperkalemia -with some hemolysis seen on sample -will follow renal and use some kayexalate   Code Status: full Family Communication: no family at bedside Disposition Plan: home when medically stable. Still needs further diuresis and her renal function is trending up. Will follow up renal rec's. Will require more than 2 midnights to stabilizes her condition and with high risk for decompensation giving hx of renal transplant and use of immunosuppressants    Consultants:  Renal service   Procedures:  See below for x-ray reports   Antibiotics:  None   HPI/Subjective: Afebrile, denies CP and SOB. Still with signs of fluid overload, but  much improved. Continue to experienced HA and intermittent bladder spasms. Cr level even higher today.  Objective: Vitals:   04/27/16 0530 04/27/16 0850  BP: (!) 152/94 127/77  Pulse: (!) 121 (!) 120  Resp: 20 20   Temp: 98.3 F (36.8 C) 98.7 F (37.1 C)    Intake/Output Summary (Last 24 hours) at 04/27/16 1706 Last data filed at 04/27/16 1551  Gross per 24 hour  Intake             1380 ml  Output             1400 ml  Net              -20 ml   Filed Weights   04/26/16 2031  Weight: 98.6 kg (217 lb 6 oz)    Exam:   General:  Afebrile, in mild distress secondary to intermittent bladder spasm and ongoing migraine. No CP and reports breathing to be ok. Patient with improvement in her volume status.   Cardiovascular: no rubs, no murmurs, no gallops. S1 and S2  Respiratory: good air movement, no wheezing, no frank crackles appreciated   Abdomen: soft, NT, positive BS  Musculoskeletal: no joint swelling, FROM. Positive 1++ edema.  Data Reviewed: Basic Metabolic Panel:  Recent Labs Lab 04/25/16 0157 04/26/16 0545 04/27/16 1125  NA 138 138 138  K 3.6 4.6 5.6*  CL 95* 93* 98*  CO2 31 33* 29  GLUCOSE 122* 110* 157*  BUN 36* 49* 53*  CREATININE 2.22* 2.86* 3.08*  CALCIUM 8.9 8.7* 8.5*  PHOS 3.6  --  2.4*   Liver Function Tests:  Recent Labs Lab 04/25/16 0157 04/27/16 1125  ALBUMIN 3.5 3.5   CBC:  Recent Labs Lab 04/25/16 0157  WBC 8.1  HGB 13.6  HCT 39.3  MCV 89.5  PLT 262   Studies: No results found.  Scheduled Meds: . allopurinol  100 mg Oral BID  . aspirin  81 mg Oral Daily  . clonazePAM  0.5 mg Oral Q24H  . clonazePAM  1 mg Oral BID  . cycloSPORINE modified  100 mg Oral BID  . cycloSPORINE modified  25 mg Oral Daily  . enoxaparin (LOVENOX) injection  40 mg Subcutaneous Q24H  . famotidine  20 mg Oral Daily  . fenofibrate  160 mg Oral Daily  . flavoxATE  100 mg Oral TID  . furosemide  40 mg Oral Daily  . gabapentin  300 mg Oral TID  . gemfibrozil  600 mg Oral BID AC  . levETIRAcetam  500 mg Oral BID  . magnesium oxide  800 mg Oral BID  . mycophenolate  500 mg Oral BID  . norethindrone  1 tablet Oral Daily  . omega-3 acid ethyl esters  1 g Oral TID   . pravastatin  40 mg Oral QHS  . predniSONE  2.5 mg Oral Q breakfast  . sertraline  100 mg Oral BID  . Teriparatide (Recombinant)  20 mcg Subcutaneous Daily  . valproate sodium  500 mg Intravenous Once   Continuous Infusions: . sodium chloride 10 mL/hr at 04/25/16 2156    Principal Problem:   Volume overload Active Problems:   Obesity   Renal transplant recipient   Depression   Neurogenic bladder   Essential hypertension   CKD (chronic kidney disease) stage 2, GFR 60-89 ml/min    Time spent: 30 minutes    Vassie Loll  [facility] Pager [phone]. If 7PM-7AM, please contact night-coverage at [email or web address], password [email or web address] 04/27/2016,  5:06 PM  LOS: 3 days

## 2016-04-27 NOTE — Progress Notes (Signed)
Patient complained of a migraine. RN notified on call NP, Craige CottaKirby, and order for Fioricet placed. While waiting patient has called out twice for it. RN takes medication to patient and found patient sleeping soundly. RN calls patient's name twice, patient opens eyes and falls back to sleep. RN had another RN verify that patient is sleeping. RN will go back in the room when patient calls out for medication.  Veatrice KellsMahmoud,Amandine Covino I, RN

## 2016-04-28 ENCOUNTER — Inpatient Hospital Stay (HOSPITAL_COMMUNITY): Payer: Medicare Other

## 2016-04-28 DIAGNOSIS — R0600 Dyspnea, unspecified: Secondary | ICD-10-CM

## 2016-04-28 DIAGNOSIS — G43811 Other migraine, intractable, with status migrainosus: Secondary | ICD-10-CM

## 2016-04-28 DIAGNOSIS — Z6833 Body mass index (BMI) 33.0-33.9, adult: Secondary | ICD-10-CM

## 2016-04-28 LAB — RENAL FUNCTION PANEL
Albumin: 3.4 g/dL — ABNORMAL LOW (ref 3.5–5.0)
Anion gap: 10 (ref 5–15)
BUN: 48 mg/dL — ABNORMAL HIGH (ref 6–20)
CALCIUM: 8.3 mg/dL — AB (ref 8.9–10.3)
CO2: 36 mmol/L — ABNORMAL HIGH (ref 22–32)
CREATININE: 2.63 mg/dL — AB (ref 0.44–1.00)
Chloride: 95 mmol/L — ABNORMAL LOW (ref 101–111)
GFR calc Af Amer: 27 mL/min — ABNORMAL LOW (ref >60)
GFR, EST NON AFRICAN AMERICAN: 23 mL/min — AB (ref >60)
Glucose, Bld: 117 mg/dL — ABNORMAL HIGH (ref 65–99)
Phosphorus: 7.2 mg/dL — ABNORMAL HIGH (ref 2.5–4.6)
Potassium: 4.8 mmol/L (ref 3.5–5.1)
SODIUM: 141 mmol/L (ref 135–145)

## 2016-04-28 LAB — ECHOCARDIOGRAM COMPLETE
Height: 63 in
Weight: 3477.98 oz

## 2016-04-28 MED ORDER — PROMETHAZINE HCL 25 MG/ML IJ SOLN
25.0000 mg | Freq: Three times a day (TID) | INTRAMUSCULAR | Status: DC | PRN
  Administered 2016-04-28: 25 mg via INTRAVENOUS
  Filled 2016-04-28: qty 1

## 2016-04-28 MED ORDER — VALPROATE SODIUM 500 MG/5ML IV SOLN
1000.0000 mg | Freq: Once | INTRAVENOUS | Status: AC
  Administered 2016-04-28: 1000 mg via INTRAVENOUS
  Filled 2016-04-28 (×2): qty 10

## 2016-04-28 MED ORDER — MAGNESIUM SULFATE 50 % IJ SOLN
500.0000 mg | Freq: Once | INTRAVENOUS | Status: AC
  Administered 2016-04-28: 500 mg via INTRAVENOUS
  Filled 2016-04-28 (×2): qty 1

## 2016-04-28 MED ORDER — METOPROLOL TARTRATE 50 MG PO TABS
50.0000 mg | ORAL_TABLET | Freq: Two times a day (BID) | ORAL | Status: DC
  Administered 2016-04-28 – 2016-04-30 (×4): 50 mg via ORAL
  Filled 2016-04-28 (×4): qty 1

## 2016-04-28 NOTE — Progress Notes (Signed)
Referring Physician: Dr Gwenlyn Perking    Chief Complaint: headache  HPI: Donna Shannon is an 30 y.o. female with a PMHx of retroperitoneal fibrosis and renal transplants, obesity, chronic kidney disease, neurogenic bladder, hypertension, depression, hyperlipidemia, migraines, on home oxygen therapy, and chronic back pain who is admitted 04/24/2016 with concern for volume overload and a 40 pound weight gain in 2 months after starting Lyrica. She reported dyspnea on exertion, recent pneumonia, increased use of inhaler. She is on CellCept, prednisone and cyclosporine. She has had worsening migraines. She's been given Depakote and Imitrex and CPAP overnight.   Patient reports that headache started Saturday with severe nausea and vomiting. These headaches are different than her normal migraines. She feels pressure all around the head, blurry vision, worse when laying flat, lots of pressure and throbbing, she is also hearing swishing in a heartbeat in her ear, lots of nausea and vomiting and can be a 10 out of 10 in pain.   Past Medical History:  Diagnosis Date  . Anxiety   . Arthritis    "left knee" (04/24/2016)  . Chronic lower back pain   . Depression   . Essential hypertension   . Factor V Leiden (HCC)    [name] 04/24/2016  . Frequent UTI    [name] 04/24/2016  . Gout   . High cholesterol   . Migraine    "weekly" (04/24/2016)  . Neurogenic bladder    augmented bladder, I/O self caths  . On home oxygen therapy    "2L; every night" (04/24/2016)  . Ovarian cyst dx'd 04/22/2016   left  . Pneumonia 03/2016   [name] 04/24/2016  . Renal transplant failure and rejection    age 34-11  . 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 Self-catheterizes urinary bladder    "~ 12 X/day" (04/24/2016)  . Stroke [facility]) 1991   denies residual on 04/24/2016    Past Surgical History:  Procedure Laterality Date  . ABDOMINAL HERNIA REPAIR  "several; when I  was little"  . allograph biopsy  2013   [name] 04/24/2016  . HERNIA REPAIR    . KIDNEY TRANSPLANT  1996; 1998   father was donor; mother was donor/notes 04/24/2016  . PACEMAKER INSERTION  "?early 2000s"   for bladder function  . PARTIAL NEPHRECTOMY  1990s X 5   "removing disease"  . PORTACATH PLACEMENT Right 11/2015  . RIGHT OOPHORECTOMY Right 2001   ovarian torsion    Family History  Problem Relation Age of Onset  . CAD Father     s/p CABG   Social History:  reports that she quit smoking about 21 months ago. Her smoking use included Cigarettes. She has a 2.00 pack-year smoking history. She has never used smokeless tobacco. She reports that she drinks alcohol. She reports that she does not use drugs.  Allergies: No Known Allergies  Medications:  Current Facility-Administered Medications:  .  0.9 %  sodium chloride infusion, , Intravenous, Continuous, Leda Gauze, NP, Last Rate: 10 mL/hr at 04/25/16 2156 .  acetaminophen (TYLENOL) tablet 650 mg, 650 mg, Oral, Q6H PRN, 650 mg at 04/27/16 1546 **OR** acetaminophen (TYLENOL) suppository 650 mg, 650 mg, Rectal, Q6H PRN, Jonah Blue, MD .  allopurinol (ZYLOPRIM) tablet 100 mg, 100 mg, Oral, BID, Jonah Blue, MD, 100 mg at 04/28/16 1610 .  aspirin chewable tablet 81 mg, 81 mg, Oral, Daily, Jonah Blue, MD, 81 mg at 04/28/16 0920 .  calcium  carbonate (dosed in mg elemental calcium) suspension 500 mg of elemental calcium, 500 mg of elemental calcium, Oral, Q6H PRN, Jonah Blue, MD .  camphor-menthol [facility]) lotion 1 application, 1 application, Topical, Q8H PRN **AND** hydrOXYzine (ATARAX/VISTARIL) tablet 25 mg, 25 mg, Oral, Q8H PRN, Jonah Blue, MD .  clonazePAM Scarlette Calico) tablet 0.5 mg, 0.5 mg, Oral, Q24H, Jonah Blue, MD, 0.5 mg at 04/27/16 1545 .  clonazePAM (KLONOPIN) tablet 1 mg, 1 mg, Oral, BID, Jonah Blue, MD, 1 mg at 04/28/16 1610 .  cycloSPORINE modified (NEORAL) capsule 100 mg, 100 mg, Oral, BID,  Annie Sable, MD, 100 mg at 04/28/16 0918 .  cycloSPORINE modified (NEORAL) capsule 25 mg, 25 mg, Oral, Daily, Annie Sable, MD, 25 mg at 04/28/16 0920 .  docusate sodium (ENEMEEZ) enema 283 mg, 1 enema, Rectal, PRN, Jonah Blue, MD .  enoxaparin (LOVENOX) injection 40 mg, 40 mg, Subcutaneous, Q24H, Jonah Blue, MD, 40 mg at 04/27/16 2125 .  famotidine (PEPCID) tablet 20 mg, 20 mg, Oral, Daily, Jonah Blue, MD, 20 mg at 04/28/16 0920 .  feeding supplement (NEPRO CARB STEADY) liquid 237 mL, 237 mL, Oral, TID PRN, Jonah Blue, MD .  fenofibrate tablet 160 mg, 160 mg, Oral, Daily, Jonah Blue, MD, 160 mg at 04/28/16 0920 .  flavoxATE (URISPAS) tablet 100 mg, 100 mg, Oral, TID, Jonah Blue, MD, 100 mg at 04/28/16 0920 .  furosemide (LASIX) tablet 40 mg, 40 mg, Oral, Daily, Elvis Coil, MD, 40 mg at 04/28/16 0920 .  gabapentin (NEURONTIN) capsule 300 mg, 300 mg, Oral, TID, Jonah Blue, MD, 300 mg at 04/28/16 0921 .  gemfibrozil (LOPID) tablet 600 mg, 600 mg, Oral, BID AC, Jonah Blue, MD, 600 mg at 04/28/16 0921 .  levETIRAcetam (KEPPRA) tablet 500 mg, 500 mg, Oral, BID, Jonah Blue, MD, 500 mg at 04/28/16 9604 .  magnesium oxide (MAG-OX) tablet 800 mg, 800 mg, Oral, BID, Jonah Blue, MD, 800 mg at 04/28/16 0920 .  morphine 2 MG/ML injection 2 mg, 2 mg, Intravenous, Q6H PRN, Vassie Loll, MD, 2 mg at 04/28/16 0646 .  mycophenolate (CELLCEPT) capsule 500 mg, 500 mg, Oral, BID, Annie Sable, MD, 500 mg at 04/28/16 0921 .  norethindrone (MICRONOR,CAMILA,ERRIN) 0.35 MG tablet 0.35 mg, 1 tablet, Oral, Daily, Vassie Loll, MD, 0.35 mg at 04/28/16 1055 .  omega-3 acid ethyl esters (LOVAZA) capsule 1 g, 1 g, Oral, TID, Jonah Blue, MD, 1 g at 04/28/16 0920 .  ondansetron (ZOFRAN) tablet 4 mg, 4 mg, Oral, Q6H PRN, 4 mg at 04/25/16 2143 **OR** ondansetron (ZOFRAN) injection 4 mg, 4 mg, Intravenous, Q6H PRN, Jonah Blue, MD, 4 mg at 04/28/16 1303 .   oxybutynin (DITROPAN) tablet 5 mg, 5 mg, Oral, Q8H PRN, Vassie Loll, MD, 5 mg at 04/28/16 1303 .  pravastatin (PRAVACHOL) tablet 40 mg, 40 mg, Oral, QHS, Jonah Blue, MD, 40 mg at 04/27/16 2127 .  predniSONE (DELTASONE) tablet 2.5 mg, 2.5 mg, Oral, Q breakfast, Annie Sable, MD, 2.5 mg at 04/28/16 5409 .  prochlorperazine (COMPAZINE) tablet 10 mg, 10 mg, Oral, Q6H PRN, Jonah Blue, MD .  promethazine (PHENERGAN) injection 25 mg, 25 mg, Intravenous, Q8H PRN, Vassie Loll, MD .  sertraline (ZOLOFT) tablet 100 mg, 100 mg, Oral, BID, Jonah Blue, MD, 100 mg at 04/28/16 8119 .  sodium chloride flush (NS) 0.9 % injection 10-40 mL, 10-40 mL, Intracatheter, PRN, Annie Sable, MD .  sorbitol 70 % solution 30 mL, 30 mL, Oral, PRN, Jonah Blue, MD .  Teriparatide (Recombinant) SOLN 20  mcg, 20 mcg, Subcutaneous, Daily, Vassie Lollarlos Madera, MD, 20 mcg at 04/28/16 1055 .  traMADol (ULTRAM) tablet 50 mg, 50 mg, Oral, Q6H PRN, Jonah BlueJennifer Yates, MD, 50 mg at 04/28/16 16100922 .  zolpidem (AMBIEN) tablet 5 mg, 5 mg, Oral, QHS PRN, Jonah BlueJennifer Yates, MD, 5 mg at 04/27/16 2127   ROS: She denies fevers, neck pain, chills. All others negative.  Physical Examination: Blood pressure 118/85, pulse (!) 127, temperature 98.6 F (37 C), temperature source Oral, resp. rate 18, height 5\' 3"  (1.6 m), weight 98.6 kg (217 lb 6 oz), last menstrual period 04/08/2016, SpO2 96 %.  Laboratory Studies:  Basic Metabolic Panel:  Recent Labs Lab 04/25/16 0157 04/26/16 0545 04/27/16 1125 04/28/16 0540  NA 138 138 138 141  K 3.6 4.6 5.6* 4.8  CL 95* 93* 98* 95*  CO2 31 33* 29 36*  GLUCOSE 122* 110* 157* 117*  BUN 36* 49* 53* 48*  CREATININE 2.22* 2.86* 3.08* 2.63*  CALCIUM 8.9 8.7* 8.5* 8.3*  PHOS 3.6  --  2.4* 7.2*    Liver Function Tests:  Recent Labs Lab 04/25/16 0157 04/27/16 1125 04/28/16 0540  ALBUMIN 3.5 3.5 3.4*  CBC:  Recent Labs Lab 04/25/16 0157  WBC 8.1  HGB 13.6  HCT 39.3  MCV  89.5  PLT 262    Imaging: Ct Abdomen Pelvis Wo Contrast  Result Date: 04/28/2016 CLINICAL DATA:  SHOB AND EDEMA PMH: HTN, Retroperitoneal fibrosis; Renal transplant recipient; Neurogenic bladder; Ovarian cyst; Frequent UTI; Factor V Leiden (HCC); High cholesterol; Pneumonia; On home oxygen therapy; Migraine; Stroke (HCC); Arthritis; Chronic lower back pain; Gout; Anxiety; Depression; Renal transplant failure and rejection; Self-catheterizes urinary bladder EXAM: CT ABDOMEN AND PELVIS WITHOUT CONTRAST TECHNIQUE: Multidetector CT imaging of the abdomen and pelvis was performed following the standard protocol without IV contrast. COMPARISON:  None. FINDINGS: Lower chest: No acute abnormality. Hepatobiliary: Fatty infiltration of the liver. No liver mass or focal lesion. Gallbladder is unremarkable. Common bile duct is dilated to 1 cm but shows normal distal tapering. No duct stone is seen. Pancreas: Unremarkable. No pancreatic ductal dilatation or surrounding inflammatory changes. Spleen: Normal in size without focal abnormality. Adrenals/Urinary Tract: Status post bilateral nephrectomies. Left sided renal transplant kidney with no mass, stone or hydronephrosis. Normal renal transplant ureter. Bladder is mostly decompressed with a Foley catheter. Stomach/Bowel: Stomach is within normal limits. Appendix not visualized. No evidence of bowel wall thickening, distention, or inflammatory changes. Vascular/Lymphatic: No significant vascular findings are present. No enlarged abdominal or pelvic lymph nodes. Reproductive: Uterus and bilateral adnexa are unremarkable. Other: No ascites Musculoskeletal: No acute or significant osseous findings. IMPRESSION: 1. No acute findings within the abdomen or pelvis. 2. Status post bilateral nephrectomies. Left renal transplant kidney is unremarkable. 3. Hepatic steatosis. Electronically Signed   By: Amie Portlandavid  Ormond M.D.   On: 04/28/2016 14:08   Physical exam: Exam: Gen: NAD,  conversant, well nourised, morbidly obese, well groomed                     CV: no MRG. No Carotid Bruits. No peripheral edema, warm, nontender Eyes: Conjunctivae clear without exudates or hemorrhage  Neuro: Detailed Neurologic Exam  Speech:    Speech is normal; fluent and spontaneous with normal comprehension.  Cognition:    The patient is oriented to person, place, and time;     recent and remote memory intact;     language fluent;     normal attention, concentration,     fund  of knowledge Cranial Nerves:    The pupils are equal, round, and reactive to light. There may be slight blurring of the nasal margins however difficult funduscopic exam due to patient photophobia. Visual fields are full to finger confrontation. Extraocular movements are intact. Trigeminal sensation is intact and the muscles of mastication are normal. The face is symmetric. The palate elevates in the midline. Hearing intact. Voice is normal. Shoulder shrug is normal. The tongue has normal motion without fasciculations.   Coordination:    No dysmetria  Motor Observation:    No asymmetry, no atrophy, and no involuntary movements noted. Tone:    Normal muscle tone.      Strength:    Strength is V/V in the upper and lower limbs.      Sensation: intact to LT     Reflex Exam:  DTR's:    Deep tendon reflexes in the upper and lower extremities are symmetric bilaterally.   Toes:    The toes are downgoing bilaterally.   Clonus:    Clonus is absent.  Assessment: Donna Shannon is an 30 y.o. female with a PMHx of retroperitoneal fibrosis and renal transplants, obesity, chronic kidney disease, neurogenic bladder, hypertension, depression, hyperlipidemia, migraines, on home oxygen therapy, and chronic back pain who is admitted 04/24/2016 with concern for volume overload and a 40 pound weight gain in 2 months after starting Lyrica. She reported dyspnea on exertion, recent pneumonia, increased use of inhaler. She is on  CellCept, prednisone and cyclosporine. She has had worsening headaches. She's been given Depakote and Imitrex and CPAP overnight.  Patient is complaining of blurry vision, severe pressure in the head, positional worse with laying down, vision changes, hearing changes of swishing and heart beat in her ear. Concerning for intracranial hypertension. Difficulty with funduscopic exam cannot rule out mild papilledema.  - MRI of the brain and orbits. Patient does have stimulator for her bladder however she reports she can have MRIs of her brain and has had them in the past. Need to evaluate for signs of idiopathic increased hypertension such as empty sella and increased fluid around the optic nerves, also need to evaluate for other space-occupying lesions that can cause symptoms as well as infection given patient's multiple immunosuppressives.  - MRV to evaluate for cerebral venous thrombosis  - Pending results today, tomorrow morning may perform a lumbar puncture for opening pressure and fluid for labs. Please hold Lovenox this evening (spoke to her nurse just now, hold dose tonight). Will check coags in the morning.   - I recommend trying the following again to see if it helps with headaches. If it does not I recommend very low dose pain medications until tomorrow when we can perform the LP.   1000mg  Depacon IV over 5 minutes Phenergan 25mg  IV (may give this prn as well for nausea) 500mg  mag sulfate over 15  Minutes   Personally examined patient and images, and have participated in and made any corrections needed to history, physical, neuro exam,assessment and plan as stated above.  I have personally obtained the history, evaluated lab date, reviewed imaging studies and agree with radiology interpretations.    Naomie Dean, MD [facility] neurohospitalist [phone]

## 2016-04-28 NOTE — Progress Notes (Signed)
  Echocardiogram 2D Echocardiogram has been performed.  Arvil ChacoFoster, Ferdinand Revoir 04/28/2016, 10:41 AM

## 2016-04-28 NOTE — Progress Notes (Signed)
[facility] PROGRESS NOTE  Donna EvenerStephanie Shannon VFI:[id number] DOB: 09/01/1985 DOA: 04/24/2016 PCP: No PCP Per Patient  Interim summary and HPI 30 y.o. 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   Patient reports that she weighed 160 in August.  Dr. Logan BoresEvans started her on Lyrica for bladder pain (didn't want to be on narcotics anymore so only taking Ultram and Lyrica) - in just 2 months she was up to 200 pounds.  He stopped the Lyrica, now on Gabapentin.  Also came off of Depo Provera because of weight (went on Forteo due to osteoporosis in the spine).  Here because she is so fluid overloaded that she can't breathe, has to use O2 at night and thinks she needs it all day too.  Wheezing, cough, can't go up.down stairs.  Symptoms since September.  Assessment/Plan: Volume overload and SOB: patient with chronic diastolic heart failure -Patient with h/o kidney transplant -still with volume overload on exam; but much improved. -Has not responded to 20mg  or 60mg  PO Lasix -Patient admitted for IV diuresis; received 80mg  BID Lasix X 4 doses. Now Cr trending down 2.63 -continue lasix 40 mg PO as per renal rec's -Nephrology is following her and will follow rec's -echo with grade 2 diastolic HF and preserved EF; no wall motion abnormalities   Obesity -While volume overload is likely contributing, I suspect that her weight is also due to medication side effects (Depo, Lyrica/gabapentin, steroids) and that some of what will be required is low calorie diet/exercise -Her respiratory compromise/need for Albuterol and nighttime O2 is likely OSA/OHS related to rapid weight gain and her overall body habitus -will benefit of outpatient sleep study -nutrition assessment requested  -TSH WNL  S/p renal  transplant -Continue rejection medications -per renal will cyclosporine level has been checked and is 93 -Current creatinine is indicative of mild to moderate CKD, no current concern for volume deficiency.   -furosemide change to 40mg  daily now -will follow renal service rec's -good diuresis has continued  Neurogenic bladder -Has bladder pacemaker -I/O cath at home -foley placed on admission based on projected aggressive diuresis -Continue home meds and outpatient follow up with urology service as an outpatient -will add ditropan for bladder spasm   Depression (with anxiety and hx of ADHD) -Continue Klonopin, Zoloft and Ambien -Holding Ritalin - should not require it as inpatient  Chronic pain syndrome -will continue PRN home pain meds -ditropan PRN for bladder spasm and morphine for severe pain   HTN -BP stable and rising -will resume metoprolol at half dose  -diuretics also controlling BP -heart healthy diet and low sodium discussed with patient  Migraines and Nausea -neurology consulted -with concerns on hx now for potential increase intracranial pressure/psudotumor cerebri -plan is for MRI and LP  -will give depakote 1000mg  IV X 1 over 5 minutes; continue PNR phenergan and Magnesium 500mg  over 15 minutes.  -avoid the use of narcotics if possible -imitrex given on 10/20 w/o any relief  -will continue using empiric CPAP overnight   Hyperkalemia -with some hemolysis seen on sample -after low dose kayexalate, K is now WNL  Code Status: full Family Communication: no family at bedside Disposition Plan: home when medically stable. Renal function improving now; but continue complaining of HA's and nausea. Will follow up renal and neurology rec's. Will require more than 2  midnights to stabilizes her condition and with high risk for decompensation giving hx of renal transplant and use of immunosuppressants    Consultants:  Renal service   Neurology    Procedures:  See below for x-ray reports   2-D echo - Left ventricle: The cavity size was normal. Wall thickness was   increased in a pattern of mild LVH. Systolic function was normal.   The estimated ejection fraction was in the range of 60% to 65%.   Wall motion was normal; there were no regional wall motion   abnormalities. Features are consistent with a pseudonormal left   ventricular filling pattern, with concomitant abnormal relaxation   and increased filling pressure (grade 2 diastolic dysfunction).  Impressions: - Normal LV systolic function   Grade 2 diastolic dysfunction  Lumbar Puncture (planned for 10/22)  Antibiotics:  None   HPI/Subjective: Afebrile, denies CP and SOB. Stable volume and with good diuresis. Patient continue complaining of persistent migraine and nausea.   Objective: Vitals:   04/28/16 0519 04/28/16 1052  BP: 119/85 118/85  Pulse: (!) 122 (!) 127  Resp: 18 18  Temp: 97.1 F (36.2 C) 98.6 F (37 C)    Intake/Output Summary (Last 24 hours) at 04/28/16 1851 Last data filed at 04/28/16 1230  Gross per 24 hour  Intake              480 ml  Output             1850 ml  Net            -1370 ml   Filed Weights   04/26/16 2031 04/28/16 0500  Weight: 98.6 kg (217 lb 6 oz) 98.6 kg (217 lb 6 oz)    Exam:   General:  Afebrile, continue to be in distress due to uncontrolled persistent HA and nausea; denies CP and SOB. Patient also with vomiting episodes overnight.volume is stable and renal function improved.   Cardiovascular: no rubs, no murmurs, no gallops. S1 and S2  Respiratory: good air movement, no wheezing, no frank crackles appreciated   Abdomen: soft, NT, positive BS  Musculoskeletal: no joint swelling, FROM. Positive 1++ edema.  Data Reviewed: Basic Metabolic Panel:  Recent Labs Lab 04/25/16 0157 04/26/16 0545 04/27/16 1125 04/28/16 0540  NA 138 138 138 141  K 3.6 4.6 5.6* 4.8  CL 95* 93* 98* 95*  CO2 31 33* 29 36*   GLUCOSE 122* 110* 157* 117*  BUN 36* 49* 53* 48*  CREATININE 2.22* 2.86* 3.08* 2.63*  CALCIUM 8.9 8.7* 8.5* 8.3*  PHOS 3.6  --  2.4* 7.2*   Liver Function Tests:  Recent Labs Lab 04/25/16 0157 04/27/16 1125 04/28/16 0540  ALBUMIN 3.5 3.5 3.4*   CBC:  Recent Labs Lab 04/25/16 0157  WBC 8.1  HGB 13.6  HCT 39.3  MCV 89.5  PLT 262   Studies: Ct Abdomen Pelvis Wo Contrast  Result Date: 04/28/2016 CLINICAL DATA:  SHOB AND EDEMA PMH: HTN, Retroperitoneal fibrosis; Renal transplant recipient; Neurogenic bladder; Ovarian cyst; Frequent UTI; Factor V Leiden (HCC); High cholesterol; Pneumonia; On home oxygen therapy; Migraine; Stroke (HCC); Arthritis; Chronic lower back pain; Gout; Anxiety; Depression; Renal transplant failure and rejection; Self-catheterizes urinary bladder EXAM: CT ABDOMEN AND PELVIS WITHOUT CONTRAST TECHNIQUE: Multidetector CT imaging of the abdomen and pelvis was performed following the standard protocol without IV contrast. COMPARISON:  None. FINDINGS: Lower chest: No acute abnormality. Hepatobiliary: Fatty infiltration of the liver. No liver mass or focal lesion. Gallbladder is  unremarkable. Common bile duct is dilated to 1 cm but shows normal distal tapering. No duct stone is seen. Pancreas: Unremarkable. No pancreatic ductal dilatation or surrounding inflammatory changes. Spleen: Normal in size without focal abnormality. Adrenals/Urinary Tract: Status post bilateral nephrectomies. Left sided renal transplant kidney with no mass, stone or hydronephrosis. Normal renal transplant ureter. Bladder is mostly decompressed with a Foley catheter. Stomach/Bowel: Stomach is within normal limits. Appendix not visualized. No evidence of bowel wall thickening, distention, or inflammatory changes. Vascular/Lymphatic: No significant vascular findings are present. No enlarged abdominal or pelvic lymph nodes. Reproductive: Uterus and bilateral adnexa are unremarkable. Other: No ascites  Musculoskeletal: No acute or significant osseous findings. IMPRESSION: 1. No acute findings within the abdomen or pelvis. 2. Status post bilateral nephrectomies. Left renal transplant kidney is unremarkable. 3. Hepatic steatosis. Electronically Signed   By: Amie Portland M.D.   On: 04/28/2016 14:08   Dg Chest 2 View  Result Date: 04/28/2016 CLINICAL DATA:  Shortness of Breath EXAM: CHEST  2 VIEW COMPARISON:  None. FINDINGS: Right chest wall port is noted in satisfactory position. Cardiac shadow is mildly enlarged. The lungs are clear bilaterally. No acute bony abnormality is seen. IMPRESSION: No active cardiopulmonary disease. Electronically Signed   By: Alcide Clever M.D.   On: 04/28/2016 14:14    Scheduled Meds: . allopurinol  100 mg Oral BID  . aspirin  81 mg Oral Daily  . clonazePAM  0.5 mg Oral Q24H  . clonazePAM  1 mg Oral BID  . cycloSPORINE modified  100 mg Oral BID  . cycloSPORINE modified  25 mg Oral Daily  . enoxaparin (LOVENOX) injection  40 mg Subcutaneous Q24H  . famotidine  20 mg Oral Daily  . fenofibrate  160 mg Oral Daily  . flavoxATE  100 mg Oral TID  . furosemide  40 mg Oral Daily  . gabapentin  300 mg Oral TID  . gemfibrozil  600 mg Oral BID AC  . levETIRAcetam  500 mg Oral BID  . magnesium oxide  800 mg Oral BID  . magnesium sulfate 1 - 4 g bolus IVPB  500 mg Intravenous Once  . metoprolol tartrate  50 mg Oral BID  . mycophenolate  500 mg Oral BID  . norethindrone  1 tablet Oral Daily  . omega-3 acid ethyl esters  1 g Oral TID  . pravastatin  40 mg Oral QHS  . predniSONE  2.5 mg Oral Q breakfast  . sertraline  100 mg Oral BID  . Teriparatide (Recombinant)  20 mcg Subcutaneous Daily  . valproate sodium  1,000 mg Intravenous Once   Continuous Infusions: . sodium chloride 10 mL/hr at 04/25/16 2156    Principal Problem:   Volume overload Active Problems:   Obesity   Renal transplant recipient   Depression   Neurogenic bladder   Essential hypertension    CKD (chronic kidney disease) stage 2, GFR 60-89 ml/min    Time spent: 30 minutes    Vassie Loll  [facility] Pager [phone]. If 7PM-7AM, please contact night-coverage at [email or web address], password [facility] 04/28/2016, 6:51 PM  LOS: 4 days

## 2016-04-28 NOTE — Progress Notes (Signed)
[facility] ROUNDING NOTE   Subjective:   Interval History:  Massive fluid overload - several months of worsening edema   Objective:  Vital signs in last 24 hours:  Temp:  [97.1 F (36.2 C)-98.6 F (37 C)] 98.6 F (37 C) (10/21 1052) Pulse Rate:  [100-128] 127 (10/21 1052) Resp:  [18-22] 18 (10/21 1052) BP: (118-147)/(64-89) 118/85 (10/21 1052) SpO2:  [96 %-100 %] 96 % (10/21 1052) Weight:  [98.6 kg (217 lb 6 oz)] 98.6 kg (217 lb 6 oz) (10/21 0500)  Weight change: 0 kg (0 lb) Filed Weights   04/26/16 2031 04/28/16 0500  Weight: 98.6 kg (217 lb 6 oz) 98.6 kg (217 lb 6 oz)    Intake/Output: I/O last 3 completed shifts: In: 2235 [P.O.:2115; I.V.:120] Out: 2652 [Urine:2651; Stool:1]   Intake/Output this shift:  No intake/output data recorded.  Obese with thick obese neck  CVS- RRR RS- CTA anteriorly  ABD- Distended with small open area in suprapubic area EXT- no edema   Basic Metabolic Panel:  Recent Labs Lab 04/25/16 0157 04/26/16 0545 04/27/16 1125 04/28/16 0540  NA 138 138 138 141  K 3.6 4.6 5.6* 4.8  CL 95* 93* 98* 95*  CO2 31 33* 29 36*  GLUCOSE 122* 110* 157* 117*  BUN 36* 49* 53* 48*  CREATININE 2.22* 2.86* 3.08* 2.63*  CALCIUM 8.9 8.7* 8.5* 8.3*  PHOS 3.6  --  2.4* 7.2*    Liver Function Tests:  Recent Labs Lab 04/25/16 0157 04/27/16 1125 04/28/16 0540  ALBUMIN 3.5 3.5 3.4*   No results for input(s): LIPASE, AMYLASE in the last 168 hours. No results for input(s): AMMONIA in the last 168 hours.  CBC:  Recent Labs Lab 04/25/16 0157  WBC 8.1  HGB 13.6  HCT 39.3  MCV 89.5  PLT 262    Cardiac Enzymes: No results for input(s): CKTOTAL, CKMB, CKMBINDEX, TROPONINI in the last 168 hours.  BNP: Invalid input(s): POCBNP  CBG: No results for input(s): GLUCAP in the last 168 hours.  Microbiology: No results found for this or any previous visit.  Coagulation Studies: No results for input(s): LABPROT, INR in the  last 72 hours.  Urinalysis: No results for input(s): COLORURINE, LABSPEC, PHURINE, GLUCOSEU, HGBUR, BILIRUBINUR, KETONESUR, PROTEINUR, UROBILINOGEN, NITRITE, LEUKOCYTESUR in the last 72 hours.  Invalid input(s): APPERANCEUR    Imaging: No results found.   Medications:   . sodium chloride 10 mL/hr at 04/25/16 2156   . allopurinol  100 mg Oral BID  . aspirin  81 mg Oral Daily  . clonazePAM  0.5 mg Oral Q24H  . clonazePAM  1 mg Oral BID  . cycloSPORINE modified  100 mg Oral BID  . cycloSPORINE modified  25 mg Oral Daily  . enoxaparin (LOVENOX) injection  40 mg Subcutaneous Q24H  . famotidine  20 mg Oral Daily  . fenofibrate  160 mg Oral Daily  . flavoxATE  100 mg Oral TID  . furosemide  40 mg Oral Daily  . gabapentin  300 mg Oral TID  . gemfibrozil  600 mg Oral BID AC  . levETIRAcetam  500 mg Oral BID  . magnesium oxide  800 mg Oral BID  . mycophenolate  500 mg Oral BID  . norethindrone  1 tablet Oral Daily  . omega-3 acid ethyl esters  1 g Oral TID  . pravastatin  40 mg Oral QHS  . predniSONE  2.5 mg Oral Q breakfast  . sertraline  100 mg Oral BID  .  Teriparatide (Recombinant)  20 mcg Subcutaneous Daily   acetaminophen **OR** acetaminophen, calcium carbonate (dosed in mg elemental calcium), camphor-menthol **AND** hydrOXYzine, docusate sodium, feeding supplement (NEPRO CARB STEADY), morphine injection, ondansetron **OR** ondansetron (ZOFRAN) IV, oxybutynin, prochlorperazine, promethazine, sodium chloride flush, sorbitol, traMADol, zolpidem  Assessment/ Plan:  30 year old kidney transplant pt who presents with volume overlaod - 40 pound weight gain in the setting of lyrica and bladder dysfunction  1.Renal-pt with known kidney transplant and some allograft nephropathy- creatinine has been as high as the high 1's.  Creatinine has risen in hospital presumably from volume changes with diuresis will continue to follow. Her cyclosporin level was 92    2. Hypertension/volume-  Changed to oral furosemide 3. Immune suppressing meds- continue home doses of cyclosporine/cellcept and prednisone 4. Comfort meds-unfortunately pt is on a lot of comfort meds as well which will need to be continued- neurontin/clonopin/ambien and antidepressant 5. Anemia- has not been a significant issue  6. Genitourinary sclerosis- does in and out caths at home- will need foley cath 7. Hyperkalemia reolved  8. The massive edema retroperitoneal fibrosis  The edema is mostly upper I wonder if this is contributing to some increase in venous pressures   Will check chest XRAY and CT of abdomen and pelvis     LOS: 4 [place] Bergin W @TODAY @11 :07 AM

## 2016-04-28 NOTE — Progress Notes (Signed)
Placed patient on CPAP at 0010 nasal mask at 8Cmh20. Tolerating well

## 2016-04-29 ENCOUNTER — Other Ambulatory Visit (HOSPITAL_COMMUNITY): Payer: Medicare Other

## 2016-04-29 ENCOUNTER — Inpatient Hospital Stay (HOSPITAL_COMMUNITY): Payer: Medicare Other

## 2016-04-29 DIAGNOSIS — G932 Benign intracranial hypertension: Secondary | ICD-10-CM

## 2016-04-29 DIAGNOSIS — G4459 Other complicated headache syndrome: Secondary | ICD-10-CM

## 2016-04-29 DIAGNOSIS — N183 Chronic kidney disease, stage 3 unspecified: Secondary | ICD-10-CM

## 2016-04-29 DIAGNOSIS — I5032 Chronic diastolic (congestive) heart failure: Secondary | ICD-10-CM

## 2016-04-29 DIAGNOSIS — G4733 Obstructive sleep apnea (adult) (pediatric): Secondary | ICD-10-CM

## 2016-04-29 DIAGNOSIS — R51 Headache: Secondary | ICD-10-CM

## 2016-04-29 DIAGNOSIS — R519 Headache, unspecified: Secondary | ICD-10-CM

## 2016-04-29 LAB — CBC WITH DIFFERENTIAL/PLATELET
Basophils Absolute: 0 10*3/uL (ref 0.0–0.1)
Basophils Relative: 0 %
Eosinophils Absolute: 0.1 10*3/uL (ref 0.0–0.7)
Eosinophils Relative: 2 %
HCT: 37.8 % (ref 36.0–46.0)
Hemoglobin: 12.2 g/dL (ref 12.0–15.0)
LYMPHS ABS: 2.2 10*3/uL (ref 0.7–4.0)
Lymphocytes Relative: 38 %
MCH: 30 pg (ref 26.0–34.0)
MCHC: 32.3 g/dL (ref 30.0–36.0)
MCV: 93.1 fL (ref 78.0–100.0)
Monocytes Absolute: 0.6 10*3/uL (ref 0.1–1.0)
Monocytes Relative: 10 %
Neutro Abs: 3 10*3/uL (ref 1.7–7.7)
Neutrophils Relative %: 50 %
PLATELETS: 232 10*3/uL (ref 150–400)
RBC: 4.06 MIL/uL (ref 3.87–5.11)
RDW: 14.5 % (ref 11.5–15.5)
WBC: 6 10*3/uL (ref 4.0–10.5)

## 2016-04-29 LAB — RENAL FUNCTION PANEL
ANION GAP: 11 (ref 5–15)
Albumin: 3.4 g/dL — ABNORMAL LOW (ref 3.5–5.0)
BUN: 34 mg/dL — ABNORMAL HIGH (ref 6–20)
CHLORIDE: 98 mmol/L — AB (ref 101–111)
CO2: 35 mmol/L — AB (ref 22–32)
Calcium: 9.4 mg/dL (ref 8.9–10.3)
Creatinine, Ser: 2.12 mg/dL — ABNORMAL HIGH (ref 0.44–1.00)
GFR calc non Af Amer: 30 mL/min — ABNORMAL LOW (ref >60)
GFR, EST AFRICAN AMERICAN: 34 mL/min — AB (ref >60)
GLUCOSE: 117 mg/dL — AB (ref 65–99)
Phosphorus: 3.4 mg/dL (ref 2.5–4.6)
Potassium: 4.3 mmol/L (ref 3.5–5.1)
Sodium: 144 mmol/L (ref 135–145)

## 2016-04-29 LAB — CSF CELL COUNT WITH DIFFERENTIAL
RBC COUNT CSF: 0 /mm3
RBC Count, CSF: 1 /mm3 — ABNORMAL HIGH
Tube #: 1
Tube #: 4
WBC CSF: 2 /mm3 (ref 0–5)
WBC, CSF: 2 /mm3 (ref 0–5)

## 2016-04-29 LAB — APTT: aPTT: 30 seconds (ref 24–36)

## 2016-04-29 LAB — PROTEIN AND GLUCOSE, CSF
Glucose, CSF: 82 mg/dL — ABNORMAL HIGH (ref 40–70)
Total  Protein, CSF: 21 mg/dL (ref 15–45)

## 2016-04-29 LAB — COMPREHENSIVE METABOLIC PANEL
ALK PHOS: 33 U/L — AB (ref 38–126)
ALT: 17 U/L (ref 14–54)
AST: 22 U/L (ref 15–41)
Albumin: 3.2 g/dL — ABNORMAL LOW (ref 3.5–5.0)
Anion gap: 11 (ref 5–15)
BILIRUBIN TOTAL: 0.6 mg/dL (ref 0.3–1.2)
BUN: 34 mg/dL — ABNORMAL HIGH (ref 6–20)
CHLORIDE: 98 mmol/L — AB (ref 101–111)
CO2: 34 mmol/L — ABNORMAL HIGH (ref 22–32)
Calcium: 9.4 mg/dL (ref 8.9–10.3)
Creatinine, Ser: 2.02 mg/dL — ABNORMAL HIGH (ref 0.44–1.00)
GFR calc Af Amer: 37 mL/min — ABNORMAL LOW (ref >60)
GFR calc non Af Amer: 32 mL/min — ABNORMAL LOW (ref >60)
Glucose, Bld: 118 mg/dL — ABNORMAL HIGH (ref 65–99)
Potassium: 4.1 mmol/L (ref 3.5–5.1)
Sodium: 143 mmol/L (ref 135–145)
TOTAL PROTEIN: 6.5 g/dL (ref 6.5–8.1)

## 2016-04-29 LAB — PROTIME-INR
INR: 0.93
PROTHROMBIN TIME: 12.5 s (ref 11.4–15.2)

## 2016-04-29 MED ORDER — ACETAZOLAMIDE 250 MG PO TABS
250.0000 mg | ORAL_TABLET | Freq: Two times a day (BID) | ORAL | Status: DC
  Administered 2016-04-29 – 2016-04-30 (×3): 250 mg via ORAL
  Filled 2016-04-29 (×4): qty 1

## 2016-04-29 NOTE — Progress Notes (Signed)
Subjective: Continues to have HA.   HPI: Donna Shannon is an 30 y.o. female with a PMHx of retroperitoneal fibrosis and renal transplants, obesity, chronic kidney disease, neurogenic bladder, hypertension, depression, hyperlipidemia, migraines, on home oxygen therapy, and chronic back pain who is admitted 04/24/2016 with concern for volume overload and a 40 pound weight gain in 2 months after starting Lyrica. She reported dyspnea on exertion, recent pneumonia, increased use of inhaler. She is on CellCept, prednisone and cyclosporine. She has had worsening migraines. She's been given Depakote and Imitrex and CPAP overnight.   Patient reports that headache started Saturday with severe nausea and vomiting. These headaches are different than her normal migraines. She feels pressure all around the head, blurry vision, worse when laying flat, lots of pressure and throbbing, she is also hearing swishing in a heartbeat in her ear, lots of nausea and vomiting and can be a 10 out of 10 in pain.  Exam: Vitals:   04/29/16 0615 04/29/16 1105  BP: 111/72 113/76  Pulse: 86 95  Resp: 17 18  Temp: 97.8 F (36.6 C) 98.3 F (36.8 C)    Social History   Social History  . Marital status: Single    Spouse name: N/A  . Number of children: N/A  . Years of education: N/A   Occupational History  . school nurse    Social History Main Topics  . Smoking status: Former Smoker    Packs/day: 0.50    Years: 4.00    Types: Cigarettes    Quit date: 07/09/2014  . Smokeless tobacco: Never Used  . Alcohol use Yes     Comment: 04/24/2016 "might have 2 drinks/month, if that"  . Drug use: No  . Sexual activity: No   Other Topics Concern  . Not on file   Social History Narrative  . No narrative on file    Family History  Problem Relation Age of Onset  . CAD Father     s/p CABG    Past Medical History:  Diagnosis Date  . Anxiety   . Arthritis    "left knee" (04/24/2016)  . Chronic lower back pain    . Depression   . Essential hypertension   . Factor V Leiden (HCC)    [name]/notes 04/24/2016  . Frequent UTI    [name]/notes 04/24/2016  . Gout   . High cholesterol   . Migraine    "weekly" (04/24/2016)  . Neurogenic bladder    augmented bladder, I/O self caths  . On home oxygen therapy    "2L; every night" (04/24/2016)  . Ovarian cyst dx'd 04/22/2016   left  . Pneumonia 03/2016   [name]/notes 04/24/2016  . Renal transplant failure and rejection    age 32-11  . 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 Self-catheterizes urinary bladder    "~ 12 X/day" (04/24/2016)  . Stroke [facility](HCC) 1991   denies residual on 04/24/2016    Past Surgical History:  Procedure Laterality Date  . ABDOMINAL HERNIA REPAIR  "several; when I was little"  . allograph biopsy  2013   [name]/notes 04/24/2016  . HERNIA REPAIR    . KIDNEY TRANSPLANT  1996; 1998   father was donor; mother was donor/notes 04/24/2016  . PACEMAKER INSERTION  "?early 2000s"   for bladder function  . PARTIAL NEPHRECTOMY  1990s X 5   "removing disease"  . PORTACATH PLACEMENT Right 11/2015  . RIGHT OOPHORECTOMY Right 2001  ovarian torsion    Current Facility-Administered Medications  Medication Dose Route Frequency Provider Last Rate Last Dose  . 0.9 %  sodium chloride infusion   Intravenous Continuous Leda Gauze, NP 10 mL/hr at 04/25/16 2156    . acetaminophen (TYLENOL) tablet 650 mg  650 mg Oral Q6H PRN Jonah Blue, MD   650 mg at 04/27/16 1546   Or  . acetaminophen (TYLENOL) suppository 650 mg  650 mg Rectal Q6H PRN Jonah Blue, MD      . acetaZOLAMIDE (DIAMOX) tablet 250 mg  250 mg Oral BID Ulice Dash, PA-C   250 mg at 04/29/16 1318  . allopurinol (ZYLOPRIM) tablet 100 mg  100 mg Oral BID Jonah Blue, MD   100 mg at 04/29/16 1028  . aspirin chewable tablet 81 mg  81 mg Oral Daily Jonah Blue, MD   81 mg at 04/29/16 1027  . calcium carbonate (dosed in mg elemental  calcium) suspension 500 mg of elemental calcium  500 mg of elemental calcium Oral Q6H PRN Jonah Blue, MD      . camphor-menthol [facility]) lotion 1 application  1 application Topical Q8H PRN Jonah Blue, MD       And  . hydrOXYzine (ATARAX/VISTARIL) tablet 25 mg  25 mg Oral Q8H PRN Jonah Blue, MD      . clonazePAM Scarlette Calico) tablet 0.5 mg  0.5 mg Oral Q24H Jonah Blue, MD   0.5 mg at 04/28/16 1703  . clonazePAM (KLONOPIN) tablet 1 mg  1 mg Oral BID Jonah Blue, MD   1 mg at 04/29/16 1027  . cycloSPORINE modified (NEORAL) capsule 100 mg  100 mg Oral BID Annie Sable, MD   100 mg at 04/29/16 1025  . cycloSPORINE modified (NEORAL) capsule 25 mg  25 mg Oral Daily Annie Sable, MD   25 mg at 04/29/16 1025  . docusate sodium (ENEMEEZ) enema 283 mg  1 enema Rectal PRN Jonah Blue, MD      . enoxaparin (LOVENOX) injection 40 mg  40 mg Subcutaneous Q24H Jonah Blue, MD   Stopped at 04/28/16 2030  . famotidine (PEPCID) tablet 20 mg  20 mg Oral Daily Jonah Blue, MD   20 mg at 04/29/16 1028  . feeding supplement (NEPRO CARB STEADY) liquid 237 mL  237 mL Oral TID PRN Jonah Blue, MD      . fenofibrate tablet 160 mg  160 mg Oral Daily Jonah Blue, MD   160 mg at 04/29/16 1028  . flavoxATE (URISPAS) tablet 100 mg  100 mg Oral TID Jonah Blue, MD   100 mg at 04/29/16 1028  . furosemide (LASIX) tablet 40 mg  40 mg Oral Daily Elvis Coil, MD   40 mg at 04/29/16 1028  . gabapentin (NEURONTIN) capsule 300 mg  300 mg Oral TID Jonah Blue, MD   300 mg at 04/29/16 1027  . gemfibrozil (LOPID) tablet 600 mg  600 mg Oral BID AC Jonah Blue, MD   600 mg at 04/29/16 1029  . levETIRAcetam (KEPPRA) tablet 500 mg  500 mg Oral BID Jonah Blue, MD   500 mg at 04/29/16 1028  . magnesium oxide (MAG-OX) tablet 800 mg  800 mg Oral BID Jonah Blue, MD   800 mg at 04/29/16 1027  . metoprolol (LOPRESSOR) tablet 50 mg  50 mg Oral BID Vassie Loll, MD   50 mg at 04/29/16 1027  .  morphine 2 MG/ML injection 2 mg  2 mg Intravenous Q6H PRN Vassie Loll, MD  2 mg at 04/28/16 0646  . mycophenolate (CELLCEPT) capsule 500 mg  500 mg Oral BID Annie Sable, MD   500 mg at 04/29/16 1027  . norethindrone (MICRONOR,CAMILA,ERRIN) 0.35 MG tablet 0.35 mg  1 tablet Oral Daily Vassie Loll, MD   0.35 mg at 04/29/16 1032  . omega-3 acid ethyl esters (LOVAZA) capsule 1 g  1 g Oral TID Jonah Blue, MD   1 g at 04/29/16 1027  . ondansetron (ZOFRAN) tablet 4 mg  4 mg Oral Q6H PRN Jonah Blue, MD   4 mg at 04/29/16 1512   Or  . ondansetron [facility]) injection 4 mg  4 mg Intravenous Q6H PRN Jonah Blue, MD   4 mg at 04/28/16 1303  . oxybutynin (DITROPAN) tablet 5 mg  5 mg Oral Q8H PRN Vassie Loll, MD   5 mg at 04/28/16 1303  . pravastatin (PRAVACHOL) tablet 40 mg  40 mg Oral QHS Jonah Blue, MD   40 mg at 04/28/16 2117  . predniSONE (DELTASONE) tablet 2.5 mg  2.5 mg Oral Q breakfast Annie Sable, MD   2.5 mg at 04/29/16 1029  . prochlorperazine (COMPAZINE) tablet 10 mg  10 mg Oral Q6H PRN Jonah Blue, MD      . promethazine (PHENERGAN) injection 25 mg  25 mg Intravenous Q8H PRN Vassie Loll, MD   25 mg at 04/28/16 1859  . sertraline (ZOLOFT) tablet 100 mg  100 mg Oral BID Jonah Blue, MD   100 mg at 04/29/16 1027  . sodium chloride flush (NS) 0.9 % injection 10-40 mL  10-40 mL Intracatheter PRN Annie Sable, MD   20 mL at 04/29/16 0554  . sorbitol 70 % solution 30 mL  30 mL Oral PRN Jonah Blue, MD      . Teriparatide (Recombinant) SOLN 20 mcg  20 mcg Subcutaneous Daily Vassie Loll, MD   20 mcg at 04/28/16 1055  . traMADol (ULTRAM) tablet 50 mg  50 mg Oral Q6H PRN Jonah Blue, MD   50 mg at 04/29/16 1318  . zolpidem (AMBIEN) tablet 5 mg  5 mg Oral QHS PRN Jonah Blue, MD   5 mg at 04/27/16 2127    Allergies as of 04/24/2016  . (No Known Allergies)    Vitals: BP 113/76 (BP Location: Right Arm)   Pulse 95   Temp 98.3 F (36.8 C) (Oral)    Resp 18   Ht 5\' 3"  (1.6 m)   Wt 98.6 kg (217 lb 6 oz)   LMP 04/08/2016 (Approximate)   SpO2 97%   BMI 38.51 kg/m  Last Weight:  Wt Readings from Last 1 Encounters:  04/29/16 98.6 kg (217 lb 6 oz)   Last Height:   Ht Readings from Last 1 Encounters:  04/27/16 5\' 3"  (1.6 m)    Physical exam: Exam: Gen: NAD, conversant, well nourised, morbidly obese, well groomed                     CV: no MRG. No Carotid Bruits. No peripheral edema, warm, nontender Eyes: Conjunctivae clear without exudates or hemorrhage  Neuro: Detailed Neurologic Exam  Speech:    Speech is normal; fluent and spontaneous with normal comprehension.  Cognition:    The patient is oriented to person, place, and time;     recent and remote memory intact;     language fluent;     normal attention, concentration,     fund of knowledge Cranial Nerves:    The pupils are  equal, round, and reactive to light. There may be slight blurring of the nasal margins however difficult funduscopic exam due to patient photophobia. Visual fields are full to finger confrontation. Extraocular movements are intact. Trigeminal sensation is intact and the muscles of mastication are normal. The face is symmetric. The palate elevates in the midline. Hearing intact. Voice is normal. Shoulder shrug is normal. The tongue has normal motion without fasciculations.   Coordination:    No dysmetria  Motor Observation:    No asymmetry, no atrophy, and no involuntary movements noted. Tone:    Normal muscle tone.      Strength:    Strength is V/V in the upper and lower limbs.      Sensation: intact to LT     Reflex Exam:  DTR's:    Deep tendon reflexes in the upper and lower extremities are symmetric bilaterally.   Toes:    The toes are downgoing bilaterally.   Clonus:    Clonus is absent.  Assessment: Donna Shannon is an 30 y.o. female with a PMHx of retroperitoneal fibrosis and renal transplants, obesity, chronic kidney  disease, neurogenic bladder, hypertension, depression, hyperlipidemia, migraines, on home oxygen therapy, and chronic back pain who is admitted 04/24/2016 with concern for volume overload and a 40 pound weight gain in 2 months after starting Lyrica. She reported dyspnea on exertion, recent pneumonia, increased use of inhaler. She is on CellCept, prednisone and cyclosporine. She has had worsening headaches. She's been given Depakote and Imitrex and CPAP overnight.  Patient is complaining of blurry vision, severe pressure in the head, positional worse with laying down, vision changes, hearing changes of swishing and heart beat in her ear. Concerning for intracranial hypertension. Difficulty with funduscopic exam cannot rule out mild papilledema.  - MRI of the brain and orbits. Patient does have stimulator for her bladder however she reports she can have MRIs of her brain and has had them in the past. Need to evaluate for signs of idiopathic increased hypertension such as empty sella and increased fluid around the optic nerves, also need to evaluate for other space-occupying lesions that can cause symptoms as well as infection given patient's multiple immunosuppressives.  - MRV to evaluate for cerebral venous thrombosis  - Lumbar puncture will be performed at the bedside today. CT of the head was negative for space-occupying lesion. Consented patient for lumbar puncture.  - Discussed idiopathic intracranial hypertension with patient. Weight loss is key to improvement of this condition. Also discussed risk of permanent vision loss. She will need to be followed up with neurology fairly soon after discharge as well as ophthalmology. Discussed that Diamox is the treatment for this condition. Discussed teratogenicity do not get pregnant on this medication. Given her renal function will dose of 250 mg every 12 hours however she may need an increase in this medication and she needs to see her outpatient  neurologist within 2-4 weeks after discharge.  Discussed side effects of Diamox:  Discussed side effects of Diamox including teratogenicity do not get pregnant on this medication.  Serious side effects can inclue: Abdominal or stomach pain ,fever, chills, or sore throat , lessening of sensations or perception ,loss of appetite , mood or mental changes, including aggression, agitation, apathy, irritability, and mental depression , red, irritated, or bleeding gums , weight loss Rare Blood in the urine , decrease in sexual performance or desire , difficult or painful urination , frequent urination , hearing loss , loss of bladder control , lower  back or side pain , nosebleeds , pale skin , red or irritated eyes , ringing or buzzing in the ears , skin rash or itching , swelling , trouble breathing, Common side effects of Topamax include:  tiredness, drowsiness, dizziness, nervousness, numbness or tingly feeling, coordination problems, diarrhea, weight loss, speech/language problems, changes in vision, sensory distortion, loss of appetite, bad taste in your mouth, confusion, slowed thinking, trouble concentrating or paying attention, memory problems,    Personally examined patient and images, and have participated in and made any corrections needed to history, physical, neuro exam,assessment and plan as stated above.  I have personally obtained the history, evaluated lab date, reviewed imaging studies and agree with radiology interpretations.    Naomie Dean, MD [facility] neurohospitalist [phone]  A total of 35 minutes was spent in with this patient. 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

## 2016-04-29 NOTE — Progress Notes (Signed)
[facility] ROUNDING NOTE   Subjective:   Interval History:  Still with edema  Mostly upper extremity   CXR  Unremarkable and CT abdomen was negative s/p bilateral nephrectomies   Objective:  Vital signs in last 24 hours:  Temp:  [97.8 F (36.6 C)-99.5 F (37.5 C)] 97.8 F (36.6 C) (10/22 0615) Pulse Rate:  [86-130] 86 (10/22 0615) Resp:  [17-18] 17 (10/22 0615) BP: (111-132)/(72-101) 111/72 (10/22 0615) SpO2:  [96 %-100 %] 100 % (10/22 0615) Weight:  [98.6 kg (217 lb 6 oz)] 98.6 kg (217 lb 6 oz) (10/22 0254)  Weight change: 0 kg (0 lb) Filed Weights   04/26/16 2031 04/28/16 0500 04/29/16 0254  Weight: 98.6 kg (217 lb 6 oz) 98.6 kg (217 lb 6 oz) 98.6 kg (217 lb 6 oz)    Intake/Output: I/O last 3 completed shifts: In: 1480 [P.O.:1320; I.V.:160] Out: 3450 [Urine:3450]   Intake/Output this shift:  No intake/output data recorded.  CVS- RRR RS- CTA ABD- BS present soft non-distended EXT- no edema   Basic Metabolic Panel:  Recent Labs Lab 04/25/16 0157 04/26/16 0545 04/27/16 1125 04/28/16 0540  NA 138 138 138 141  K 3.6 4.6 5.6* 4.8  CL 95* 93* 98* 95*  CO2 31 33* 29 36*  GLUCOSE 122* 110* 157* 117*  BUN 36* 49* 53* 48*  CREATININE 2.22* 2.86* 3.08* 2.63*  CALCIUM 8.9 8.7* 8.5* 8.3*  PHOS 3.6  --  2.4* 7.2*    Liver Function Tests:  Recent Labs Lab 04/25/16 0157 04/27/16 1125 04/28/16 0540  ALBUMIN 3.5 3.5 3.4*   No results for input(s): LIPASE, AMYLASE in the last 168 hours. No results for input(s): AMMONIA in the last 168 hours.  CBC:  Recent Labs Lab 04/25/16 0157  WBC 8.1  HGB 13.6  HCT 39.3  MCV 89.5  PLT 262    Cardiac Enzymes: No results for input(s): CKTOTAL, CKMB, CKMBINDEX, TROPONINI in the last 168 hours.  BNP: Invalid input(s): POCBNP  CBG: No results for input(s): GLUCAP in the last 168 hours.  Microbiology: No results found for this or any previous visit.  Coagulation Studies:  Recent Labs   04/29/16 0500  LABPROT 12.5  INR 0.93    Urinalysis: No results for input(s): COLORURINE, LABSPEC, PHURINE, GLUCOSEU, HGBUR, BILIRUBINUR, KETONESUR, PROTEINUR, UROBILINOGEN, NITRITE, LEUKOCYTESUR in the last 72 hours.  Invalid input(s): APPERANCEUR    Imaging: Ct Abdomen Pelvis Wo Contrast  Result Date: 04/28/2016 CLINICAL DATA:  SHOB AND EDEMA PMH: HTN, Retroperitoneal fibrosis; Renal transplant recipient; Neurogenic bladder; Ovarian cyst; Frequent UTI; Factor V Leiden (HCC); High cholesterol; Pneumonia; On home oxygen therapy; Migraine; Stroke (HCC); Arthritis; Chronic lower back pain; Gout; Anxiety; Depression; Renal transplant failure and rejection; Self-catheterizes urinary bladder EXAM: CT ABDOMEN AND PELVIS WITHOUT CONTRAST TECHNIQUE: Multidetector CT imaging of the abdomen and pelvis was performed following the standard protocol without IV contrast. COMPARISON:  None. FINDINGS: Lower chest: No acute abnormality. Hepatobiliary: Fatty infiltration of the liver. No liver mass or focal lesion. Gallbladder is unremarkable. Common bile duct is dilated to 1 cm but shows normal distal tapering. No duct stone is seen. Pancreas: Unremarkable. No pancreatic ductal dilatation or surrounding inflammatory changes. Spleen: Normal in size without focal abnormality. Adrenals/Urinary Tract: Status post bilateral nephrectomies. Left sided renal transplant kidney with no mass, stone or hydronephrosis. Normal renal transplant ureter. Bladder is mostly decompressed with a Foley catheter. Stomach/Bowel: Stomach is within normal limits. Appendix not visualized. No evidence of bowel wall thickening, distention, or  inflammatory changes. Vascular/Lymphatic: No significant vascular findings are present. No enlarged abdominal or pelvic lymph nodes. Reproductive: Uterus and bilateral adnexa are unremarkable. Other: No ascites Musculoskeletal: No acute or significant osseous findings. IMPRESSION: 1. No acute findings  within the abdomen or pelvis. 2. Status post bilateral nephrectomies. Left renal transplant kidney is unremarkable. 3. Hepatic steatosis. Electronically Signed   By: Amie Portlandavid  Ormond M.D.   On: 04/28/2016 14:08   Dg Chest 2 View  Result Date: 04/28/2016 CLINICAL DATA:  Shortness of Breath EXAM: CHEST  2 VIEW COMPARISON:  None. FINDINGS: Right chest wall port is noted in satisfactory position. Cardiac shadow is mildly enlarged. The lungs are clear bilaterally. No acute bony abnormality is seen. IMPRESSION: No active cardiopulmonary disease. Electronically Signed   By: Alcide CleverMark  Lukens M.D.   On: 04/28/2016 14:14     Medications:   . sodium chloride 10 mL/hr at 04/25/16 2156   . allopurinol  100 mg Oral BID  . aspirin  81 mg Oral Daily  . clonazePAM  0.5 mg Oral Q24H  . clonazePAM  1 mg Oral BID  . cycloSPORINE modified  100 mg Oral BID  . cycloSPORINE modified  25 mg Oral Daily  . enoxaparin (LOVENOX) injection  40 mg Subcutaneous Q24H  . famotidine  20 mg Oral Daily  . fenofibrate  160 mg Oral Daily  . flavoxATE  100 mg Oral TID  . furosemide  40 mg Oral Daily  . gabapentin  300 mg Oral TID  . gemfibrozil  600 mg Oral BID AC  . levETIRAcetam  500 mg Oral BID  . magnesium oxide  800 mg Oral BID  . metoprolol tartrate  50 mg Oral BID  . mycophenolate  500 mg Oral BID  . norethindrone  1 tablet Oral Daily  . omega-3 acid ethyl esters  1 g Oral TID  . pravastatin  40 mg Oral QHS  . predniSONE  2.5 mg Oral Q breakfast  . sertraline  100 mg Oral BID  . Teriparatide (Recombinant)  20 mcg Subcutaneous Daily   acetaminophen **OR** acetaminophen, calcium carbonate (dosed in mg elemental calcium), camphor-menthol **AND** hydrOXYzine, docusate sodium, feeding supplement (NEPRO CARB STEADY), morphine injection, ondansetron **OR** ondansetron (ZOFRAN) IV, oxybutynin, prochlorperazine, promethazine, sodium chloride flush, sorbitol, traMADol, zolpidem  Assessment/ Plan:  30 year old kidney  transplant pt who presents with volume overlaod - 40 pound weight gain in the setting of lyrica and bladder dysfunction  1.Renal-pt with known kidney transplant and some allograft nephropathy- creatinine has been as high as the high 1's.  Creatinine has risen in hospital presumably from volume changes with diuresis will continue to follow. Her cyclosporin level was 92     She has diuresed well in hospital 2. Hypertension/volume- Changedto oral furosemide 3. Immune suppressing meds- continue home doses of cyclosporine/cellcept and prednisone 4. Comfort meds-unfortunately pt is on a lot of comfort meds as well which will need to be continued- neurontin/clonopin/ambien and antidepressant 5. Anemia- has not been a significant issue  6. Genitourinary sclerosis- does in and out caths at home-  Has foley catheter 7. Hyperkalemiareolved  8.  Massive edema    retroperitoneal fibrosis  The edema is mostly upper I wonder if this is contributing to some increase in venous pressures   Unremarkable xrays of abdomen and chest  No pulmonary edema, this makes me wonder if an underlying fibrotic process is occurring leading to significant asymmetrical edema    LOS: 5 Derriona Branscom W @TODAY @9 :52 AM

## 2016-04-29 NOTE — Progress Notes (Signed)
[facility] PROGRESS NOTE  Donna Shannon ZOX:[id number] DOB: Nov 14, 1985 DOA: 04/24/2016 PCP: No PCP Per Patient  Interim summary and HPI 30 y.o. 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   Patient reports that she weighed 160 in August.  Dr. Logan Bores started her on Lyrica for bladder pain (didn't want to be on narcotics anymore so only taking Ultram and Lyrica) - in just 2 months she was up to 200 pounds.  He stopped the Lyrica, now on Gabapentin.  Also came off of Depo Provera because of weight (went on Forteo due to osteoporosis in the spine).  Here because she is so fluid overloaded that she can't breathe, has to use O2 at night and thinks she needs it all day too.  Wheezing, cough, can't go up.down stairs.  Symptoms since September.  Assessment/Plan: Volume overload and SOB: patient with chronic diastolic heart failure -Patient with h/o kidney transplant -still with volume overload on exam; but much improved. -Has not responded to 20mg  or 60mg  PO Lasix -Patient admitted for IV diuresis; received 80mg  BID Lasix X 4 doses. Now Cr trending down 2.02 -continue lasix 40 mg PO as per renal rec's -Nephrology is following her and will follow rec's -echo with grade 2 diastolic HF and preserved EF; no wall motion abnormalities   Obesity -While volume overload is likely contributing, I suspect that her weight is also due to medication side effects (Depo, Lyrica/gabapentin, steroids) and that some of what will be required is low calorie diet/exercise -Her respiratory compromise/need for Albuterol and nighttime O2 is likely OSA/OHS related to rapid weight gain and her overall body habitus -will benefit of outpatient sleep study -nutrition assessment requested  -TSH WNL  S/p renal  transplant -Continue rejection medications -per renal will cyclosporine level has been checked and is 93 -Current creatinine is indicative of mild to moderate CKD, no current concern for volume deficiency.   -furosemide change to 40mg  daily now -will follow renal service rec's -good diuresis has continued  Neurogenic bladder -Has bladder pacemaker -I/O cath at home -foley placed on admission based on projected aggressive diuresis -Continue home meds and outpatient follow up with urology service as an outpatient -will add ditropan for bladder spasm   Depression (with anxiety and hx of ADHD) -Continue Klonopin, Zoloft and Ambien -Holding Ritalin - should not require it as inpatient  Chronic pain syndrome -will continue PRN home pain meds -ditropan PRN for bladder spasm and morphine for severe pain   HTN -BP stable  -will continue metoprolol at half dose  -diuretics also controlling BP -heart healthy diet and low sodium discussed with patient  Migraines and Nausea -neurology consulted -with concerns on hx now for potential increase intracranial pressure/pseudotumor cerebri -plan is for MRI  -LP  Performed by neurology with increase opening pressure. -CT head w/o acute abnormalities -given high concerns for pseudotumor cerebri; neurology recommended starting patient on diamox 250mg  BID and to follow aggressive weight loss.  -will continue using empiric CPAP overnight   Hyperkalemia -with some hemolysis seen on sample -after low dose kayexalate, K has remained WNL  Code Status: full Family Communication: no family at bedside Disposition Plan: home when medically stable. Renal function improving now; S/P LP with high opening pressure; pending MRI to complete work up and will assess response to use of diamox. Hopefully home  in the next 24-48 hours  Consultants:  Renal service   Neurology   Procedures:  See below for x-ray reports   2-D echo - Left ventricle: The  cavity size was normal. Wall thickness was   increased in a pattern of mild LVH. Systolic function was normal.   The estimated ejection fraction was in the range of 60% to 65%.   Wall motion was normal; there were no regional wall motion   abnormalities. Features are consistent with a pseudonormal left   ventricular filling pattern, with concomitant abnormal relaxation   and increased filling pressure (grade 2 diastolic dysfunction).  Impressions: - Normal LV systolic function   Grade 2 diastolic dysfunction  Lumbar Puncture (10/22): with increase opening pressure; fluid sent for culture.  Antibiotics:  None   HPI/Subjective: Afebrile, denies CP and SOB. Stable volume and continue to have good diuresis. Patient woke up w/o HA and is not nauseated.   Objective: Vitals:   04/29/16 0615 04/29/16 1105  BP: 111/72 113/76  Pulse: 86 95  Resp: 17 18  Temp: 97.8 F (36.6 C) 98.3 F (36.8 C)    Intake/Output Summary (Last 24 hours) at 04/29/16 1806 Last data filed at 04/29/16 1700  Gross per 24 hour  Intake           630.33 ml  Output             3250 ml  Net         -2619.67 ml   Filed Weights   04/26/16 2031 04/28/16 0500 04/29/16 0254  Weight: 98.6 kg (217 lb 6 oz) 98.6 kg (217 lb 6 oz) 98.6 kg (217 lb 6 oz)    Exam:   General:  Afebrile, improved and this morning waking up w/o migraine. no CP, no SOB. Reported that she tolerated CPAP last night. No nausea.   Cardiovascular: no rubs, no murmurs, no gallops. S1 and S2  Respiratory: good air movement, no wheezing, no frank crackles appreciated   Abdomen: soft, NT, positive BS  Musculoskeletal: no joint swelling, FROM. Trace edema.  Data Reviewed: Basic Metabolic Panel:  Recent Labs Lab 04/25/16 0157 04/26/16 0545 04/27/16 1125 04/28/16 0540 04/29/16 0940 04/29/16 1232  NA 138 138 138 141 144 143  K 3.6 4.6 5.6* 4.8 4.3 4.1  CL 95* 93* 98* 95* 98* 98*  CO2 31 33* 29 36* 35* 34*  GLUCOSE 122* 110* 157*  117* 117* 118*  BUN 36* 49* 53* 48* 34* 34*  CREATININE 2.22* 2.86* 3.08* 2.63* 2.12* 2.02*  CALCIUM 8.9 8.7* 8.5* 8.3* 9.4 9.4  PHOS 3.6  --  2.4* 7.2* 3.4  --    Liver Function Tests:  Recent Labs Lab 04/25/16 0157 04/27/16 1125 04/28/16 0540 04/29/16 0940 04/29/16 1232  AST  --   --   --   --  22  ALT  --   --   --   --  17  ALKPHOS  --   --   --   --  33*  BILITOT  --   --   --   --  0.6  PROT  --   --   --   --  6.5  ALBUMIN 3.5 3.5 3.4* 3.4* 3.2*   CBC:  Recent Labs Lab 04/25/16 0157 04/29/16 1232  WBC 8.1 6.0  NEUTROABS  --  3.0  HGB 13.6 12.2  HCT 39.3 37.8  MCV 89.5 93.1  PLT 262 232   Studies: Ct Abdomen Pelvis Wo  Contrast  Result Date: 04/28/2016 CLINICAL DATA:  SHOB AND EDEMA PMH: HTN, Retroperitoneal fibrosis; Renal transplant recipient; Neurogenic bladder; Ovarian cyst; Frequent UTI; Factor V Leiden (HCC); High cholesterol; Pneumonia; On home oxygen therapy; Migraine; Stroke (HCC); Arthritis; Chronic lower back pain; Gout; Anxiety; Depression; Renal transplant failure and rejection; Self-catheterizes urinary bladder EXAM: CT ABDOMEN AND PELVIS WITHOUT CONTRAST TECHNIQUE: Multidetector CT imaging of the abdomen and pelvis was performed following the standard protocol without IV contrast. COMPARISON:  None. FINDINGS: Lower chest: No acute abnormality. Hepatobiliary: Fatty infiltration of the liver. No liver mass or focal lesion. Gallbladder is unremarkable. Common bile duct is dilated to 1 cm but shows normal distal tapering. No duct stone is seen. Pancreas: Unremarkable. No pancreatic ductal dilatation or surrounding inflammatory changes. Spleen: Normal in size without focal abnormality. Adrenals/Urinary Tract: Status post bilateral nephrectomies. Left sided renal transplant kidney with no mass, stone or hydronephrosis. Normal renal transplant ureter. Bladder is mostly decompressed with a Foley catheter. Stomach/Bowel: Stomach is within normal limits. Appendix not  visualized. No evidence of bowel wall thickening, distention, or inflammatory changes. Vascular/Lymphatic: No significant vascular findings are present. No enlarged abdominal or pelvic lymph nodes. Reproductive: Uterus and bilateral adnexa are unremarkable. Other: No ascites Musculoskeletal: No acute or significant osseous findings. IMPRESSION: 1. No acute findings within the abdomen or pelvis. 2. Status post bilateral nephrectomies. Left renal transplant kidney is unremarkable. 3. Hepatic steatosis. Electronically Signed   By: Amie Portlandavid  Ormond M.D.   On: 04/28/2016 14:08   Dg Chest 2 View  Result Date: 04/28/2016 CLINICAL DATA:  Shortness of Breath EXAM: CHEST  2 VIEW COMPARISON:  None. FINDINGS: Right chest wall port is noted in satisfactory position. Cardiac shadow is mildly enlarged. The lungs are clear bilaterally. No acute bony abnormality is seen. IMPRESSION: No active cardiopulmonary disease. Electronically Signed   By: Alcide CleverMark  Lukens M.D.   On: 04/28/2016 14:14   Ct Head Wo Contrast  Result Date: 04/29/2016 CLINICAL DATA:  30 year old female with history of migraines presenting with headache nausea and vomiting which is different than normal migraines. Retroperitoneal fibrosis. Chronic kidney disease. Post renal transplant. Obesity and hypertension. Factor 5 Leiden. Initial encounter. EXAM: CT HEAD WITHOUT CONTRAST TECHNIQUE: Contiguous axial images were obtained from the base of the skull through the vertex without intravenous contrast. COMPARISON:  None. FINDINGS: Brain: No intracranial hemorrhage or CT evidence of large acute infarct. Mild atrophy without hydrocephalus. No intracranial mass lesion noted on this unenhanced exam. Vascular: No hyperdense vessel to indicate acute infarct or dural sinus thrombosis. If this is of high clinical concern MR imaging could be performed. Skull: No skull base abnormality. Sinuses/Orbits: No acute orbital abnormality. Mild exophthalmos. Visualized sinuses and  mastoid air cells are clear. Other: Negative IMPRESSION: No intracranial hemorrhage or CT evidence of large acute infarct. No hyperdense vessel to suggest dural sinus thrombosis. If this is of high clinical concern, MR imaging may then be considered. Mild atrophy. Mild exophthalmos. Electronically Signed   By: Lacy DuverneySteven  Olson M.D.   On: 04/29/2016 11:06    Scheduled Meds: . acetaZOLAMIDE  250 mg Oral BID  . allopurinol  100 mg Oral BID  . aspirin  81 mg Oral Daily  . clonazePAM  0.5 mg Oral Q24H  . clonazePAM  1 mg Oral BID  . cycloSPORINE modified  100 mg Oral BID  . cycloSPORINE modified  25 mg Oral Daily  . enoxaparin (LOVENOX) injection  40 mg Subcutaneous Q24H  . famotidine  20 mg Oral Daily  .  fenofibrate  160 mg Oral Daily  . flavoxATE  100 mg Oral TID  . furosemide  40 mg Oral Daily  . gabapentin  300 mg Oral TID  . gemfibrozil  600 mg Oral BID AC  . levETIRAcetam  500 mg Oral BID  . magnesium oxide  800 mg Oral BID  . metoprolol tartrate  50 mg Oral BID  . mycophenolate  500 mg Oral BID  . norethindrone  1 tablet Oral Daily  . omega-3 acid ethyl esters  1 g Oral TID  . pravastatin  40 mg Oral QHS  . predniSONE  2.5 mg Oral Q breakfast  . sertraline  100 mg Oral BID  . Teriparatide (Recombinant)  20 mcg Subcutaneous Daily   Continuous Infusions: . sodium chloride 10 mL/hr at 04/29/16 1700    Principal Problem:   Volume overload Active Problems:   Obesity   Renal transplant recipient   Depression   Neurogenic bladder   Essential hypertension   CKD (chronic kidney disease) stage 2, GFR 60-89 ml/min   IIH (idiopathic intracranial hypertension)    Time spent: 30 minutes    Vassie Loll  [facility] Pager [phone]. If 7PM-7AM, please contact night-coverage at [email or web address], password [facility] 04/29/2016, 6:06 PM  LOS: 5 days

## 2016-04-29 NOTE — Procedures (Signed)
Indication: elevated ICP  Risks of the procedure were dicussed with the patient including post-LP headache, bleeding, infection, weakness/numbness of legs(radiculopathy), death.  The patient proxy agreed and written consent was obtained.   The patient was prepped and draped, and using sterile technique a 20 gauge quinke spinal needle was inserted in the L3-L4 space. The opening pressure was 29 cm H2O. Approximately 20 cc of CSF were obtained and sent for analysis.  Closing pressure was 13 cm H2O  No complications were encountered.   Felicie MornDavid Smith PA-C [facility] [phone]  04/29/2016, 12:15 PM

## 2016-04-29 NOTE — Progress Notes (Signed)
Patient placed on nasal CPAP and is doing well. No issues and she is resting

## 2016-04-30 DIAGNOSIS — G932 Benign intracranial hypertension: Secondary | ICD-10-CM

## 2016-04-30 DIAGNOSIS — Z6834 Body mass index (BMI) 34.0-34.9, adult: Secondary | ICD-10-CM

## 2016-04-30 LAB — RENAL FUNCTION PANEL
Albumin: 3.4 g/dL — ABNORMAL LOW (ref 3.5–5.0)
Anion gap: 12 (ref 5–15)
BUN: 34 mg/dL — ABNORMAL HIGH (ref 6–20)
CO2: 36 mmol/L — ABNORMAL HIGH (ref 22–32)
Calcium: 9.6 mg/dL (ref 8.9–10.3)
Chloride: 95 mmol/L — ABNORMAL LOW (ref 101–111)
Creatinine, Ser: 2.24 mg/dL — ABNORMAL HIGH (ref 0.44–1.00)
GFR calc Af Amer: 32 mL/min — ABNORMAL LOW
GFR calc non Af Amer: 28 mL/min — ABNORMAL LOW
Glucose, Bld: 97 mg/dL (ref 65–99)
Phosphorus: 5.4 mg/dL — ABNORMAL HIGH (ref 2.5–4.6)
Potassium: 4.3 mmol/L (ref 3.5–5.1)
Sodium: 143 mmol/L (ref 135–145)

## 2016-04-30 LAB — HIV ANTIBODY (ROUTINE TESTING W REFLEX): HIV Screen 4th Generation wRfx: NONREACTIVE

## 2016-04-30 MED ORDER — ACETAZOLAMIDE 250 MG PO TABS: 250.0000 mg | ORAL_TABLET | Freq: Two times a day (BID) | ORAL | 1 refills | Status: DC

## 2016-04-30 MED ORDER — HEPARIN SOD (PORK) LOCK FLUSH 100 UNIT/ML IV SOLN
500.0000 [IU] | INTRAVENOUS | Status: AC | PRN
  Administered 2016-04-30: 500 [IU]

## 2016-04-30 MED ORDER — METOPROLOL TARTRATE 50 MG PO TABS: 50.0000 mg | ORAL_TABLET | Freq: Two times a day (BID) | ORAL | 1 refills | Status: DC

## 2016-04-30 MED ORDER — CYCLOSPORINE MODIFIED (NEORAL) 25 MG PO CAPS
125.0000 mg | ORAL_CAPSULE | Freq: Two times a day (BID) | ORAL | Status: DC
  Filled 2016-04-30: qty 5

## 2016-04-30 MED ORDER — OXYBUTYNIN CHLORIDE 5 MG PO TABS: 5.0000 mg | ORAL_TABLET | Freq: Three times a day (TID) | ORAL | 0 refills | Status: DC | PRN

## 2016-04-30 NOTE — Progress Notes (Signed)
Subjective: Headache has improved. Patient is desiring to go home  Exam: Vitals:   04/30/16 0549 04/30/16 0854  BP: 121/79 107/69  Pulse: 87 90  Resp: 16   Temp: 97.3 F (36.3 C) 97.7 F (36.5 C)        Gen: In bed, NAD MS: Patient is alert and oriented and follows all commands. Speech is clear CN: 2-12 intact.  Motor: Moving all extremities 5/5 Sensory: Intact throughout   Pertinent Labs/Diagnostics: None    Impression: Donna MaywoodStephanie Shannon an 30 y.o. female with a PMHx of retroperitoneal fibrosis and renal transplants, obesity, chronic kidney disease, neurogenic bladder, hypertension, depression, hyperlipidemia, migraines, on home oxygen therapy, and chronic back pain who is admitted 04/24/2016 with concern for volume overload and a 40 pound weight gain in 2 months after starting Lyrica. She reported dyspnea on exertion, recent pneumonia, increased use of inhaler. She is on CellCept, prednisone and cyclosporine. She has had worsening headaches. She's been given Depakote and Imitrex and CPAP overnight.  Patient is complaining of blurry vision, severe pressure in the head, positional worse with laying down, vision changes, hearing changes of swishing and heart beat in her ear. Concerning for intracranial hypertension. Difficulty with funduscopic exam cannot rule out mild papilledema.  - MRI of the brain and orbits. Patient does have stimulator for her bladder however she reports she can have MRIs of her brain and has had them in the past. Need to evaluate for signs of idiopathic increased hypertension such as empty sella and increased fluid around the optic nerves, also need to evaluate for other space-occupying lesions that can cause symptoms as well as infection given patient's multiple immunosuppressives.  - MRV to evaluate for cerebral venous thrombosis  - Lumbar puncture will be performed at the bedside. CT of the head was negative for space-occupying lesion. Consented  patient for lumbar puncture.  - Discussed idiopathic intracranial hypertension with patient. Weight loss is key to improvement of this condition. Also discussed risk of permanent vision loss. She will need to be followed up with neurology fairly soon after discharge as well as ophthalmology. Discussed that Diamox is the treatment for this condition. Discussed teratogenicity do not get pregnant on this medication. Given her renal function will dose of 250 mg every 12 hours however she may need an increase in this medication and she needs to see her outpatient neurologist within 2-4 weeks after discharge.  Discussed side effects of Diamox:  Discussed side effects of Diamox including teratogenicity do not get pregnant on this medication.  Serious side effects can inclue: Abdominal or stomach pain ,fever, chills, or sore throat , lessening of sensations or perception ,loss of appetite , mood or mental changes, including aggression, agitation, apathy, irritability, and mental depression , red, irritated, or bleeding gums , weight loss Rare Blood in the urine , decrease in sexual performance or desire , difficult or painful urination , frequent urination , hearing loss , loss of bladder control , lower back or side pain , nosebleeds , pale skin , red or irritated eyes , ringing or buzzing in the ears , skin rash or itching , swelling , trouble breathing, Common side effects of Topamax include:  tiredness, drowsiness, dizziness, nervousness, numbness or tingly feeling, coordination problems, diarrhea, weight loss, speech/language problems, changes in vision, sensory distortion, loss of appetite, bad taste in your mouth, confusion, slowed thinking, trouble concentrating or paying attention, memory problems,     Recommendations: 1) recommend continue Diamox at current dose. 2) patient  will need to follow-up with outpatient neurology at time of discharge. Follow with outpatient neurology should be within 2-4  weeks after discharge.   At this time neurology will sign off    Felicie Morn PA-C [facility] [phone]   04/30/2016, 9:42 AM

## 2016-04-30 NOTE — Final Progress Note (Signed)
Patient discharge teaching given, including activity, diet, follow-up appoints, and medications. Patient verbalized understanding of all discharge instructions. HD cath access was d/c'd. Foley d/c'd.  Vitals are stable. Skin is intact except as charted in most recent assessments. Pt to be escorted out by NT, to be driven home by family. Chris-RN

## 2016-04-30 NOTE — Discharge Summary (Signed)
Physician Discharge Summary  Donna Shannon WUJ:[id number] DOB: 1986/03/07 DOA: 04/24/2016  PCP: No PCP Per Patient  Admit date: 04/24/2016 Discharge date: 04/30/2016  Time spent: 35 minutes  Recommendations for Outpatient Follow-up:  1. Arrange sleep study as an outpatient; patient needs CPAP for presumed OSA 2. Repeat BMET to follow electrolytes and renal function  3. Needs follow up with neurology service in 2-4 weeks (for further assessment/evaluation and treatment of increase intracranial pressure condition)   Discharge Diagnoses:  Principal Problem:   Volume overload Active Problems:   Obesity   Renal transplant recipient   Depression   Neurogenic bladder   Essential hypertension   CKD (chronic kidney disease) stage 2, GFR 60-89 ml/min   IIH (idiopathic intracranial hypertension)   HA (headache)   Chronic renal disease, stage III   Chronic diastolic heart failure (HCC)   OSA (obstructive sleep apnea)   Discharge Condition: stable and improved. Discharge home with instructions to follow up with PCP 1 week and to follow up with neurology service in 2-4 weeks. Nephrology will also arrange outpatient visit.  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  Patient reports that she weighed 160 in August. Dr. Logan Bores started her on Lyrica for bladder pain (didn't want to be on narcotics anymore so only taking Ultram and Lyrica) - in just 2 months she was up to 200 pounds. He stopped the Lyrica, now on Gabapentin. Also came off of Depo  Provera because of weight (went on Forteo due to osteoporosis in the spine).  Here because she is so fluid overloaded that she can't breathe, has to use O2 at night and thinks she needs it all day too. Wheezing, cough, can't go up.down stairs. Symptoms since September.  Hospital Course:  Volume overload and SOB: patient with chronic diastolic heart failure -Patient with h/o kidney transplant -volume status much improved and continue to have good diuresis with current diuretics regimen. -will need BMET as an outpatient  -Has not responded to 20mg  or 60mg  PO Lasix -Patient admitted for IV diuresis; received 80mg  BID Lasix X 4 doses. Creatinine went up. -at discharge has come down to 2.1 -Nephrology recommended lasix 40mg  daily and will follow her as an outpatient  -echo with grade 2 diastolic HF and preserved EF; no wall motion abnormalities   Obesity -While volume overload is likely contributing, I suspect that her weight is also due to medication side effects (Depo, Lyrica/gabapentin, steroids) and that some of what will be required is low calorie diet/exercise -Her respiratory compromise/need for Albuterol and nighttime O2 is likely OSA/OHS related to rapid weight gain and her overall body habitus -will benefit of outpatient sleep study -TSH WNL  S/p renal transplant -Continue rejection medications -per renal will cyclosporine level has been checked and is 93 -Current creatinine is indicative of mild to moderate CKD, no current concern for volume deficiency.   -furosemide change to 40mg  daily at discharge -will follow up with renal service at discharge -good diuresis has continued  Neurogenic bladder -Has bladder pacemaker -I/O cath at home -  foley placed on admission based on projected aggressive diuresis -Continue home meds and outpatient follow up with urology service as an outpatient  Depression (with anxiety and hx of ADHD) -Continue Klonopin, Zoloft and  Ritalin  Chronic pain syndrome -will continue PRN home pain meds -ditropan PRN for bladder spasm added   HTN -BP stable  -will continue metoprolol at half dose; patient also on lasix and diamox now. -heart healthy diet and low sodium discussed with patient  Migraines and Nausea -neurology consulted -with concerns on hx now for potential increase intracranial pressure/pseudotumor cerebri -CT head without acute abnormalities  -LP  Performed by neurology with increase opening pressure. -given high concerns for pseudotumor cerebri; neurology recommended starting patient on diamox 250mg  BID and to follow aggressive weight loss strategy.  -will also benefit of sleep study and CPAP QHS  Hyperkalemia -after low dose kayexalate, K has remained WNL -recommend BMET at follow up to reassess electrolytes   Procedures:  See below for x-ray reports   LP: with increase opening pressure; neg gram stain and cultures so far.  Consultations:  Renal service  Neurology   Discharge Exam: Vitals:   04/30/16 0549 04/30/16 0854  BP: 121/79 107/69  Pulse: 87 90  Resp: 16   Temp: 97.3 F (36.3 C) 97.7 F (36.5 C)     General:  Obese, Afebrile, improved and w/o HA today. No CP, no SOB. Reported that she tolerated CPAP last night and feels better overall. continue to have good diuresis with current dose of lasix.    Cardiovascular: no rubs, no murmurs, no gallops. S1 and S2  Respiratory: good air movement, no wheezing, no frank crackles appreciated   Abdomen: soft, NT, positive BS  Musculoskeletal: no joint swelling, FROM. Trace edema bilaterally.  Discharge Instructions   Discharge Instructions    Diet - low sodium heart healthy    Complete by:  As directed    Discharge instructions    Complete by:  As directed    Keep yourself well hydrated and follow low sodium diet   Please take medications as prescribed Follow up with neurology in 2-4 weeks Please follow low calorie  diet and increase exercise  Please make sure to follow up with nephrology as an outpatient and with your PCP in 1-2 weeks  You will benefit of sleep study; will ask your PCP to schedule it for you as an outpatient     Current Discharge Medication List    START taking these medications   Details  acetaZOLAMIDE (DIAMOX) 250 MG tablet Take 1 tablet (250 mg total) by mouth 2 (two) times daily. Qty: 60 tablet, Refills: 1    oxybutynin (DITROPAN) 5 MG tablet Take 1 tablet (5 mg total) by mouth every 8 (eight) hours as needed for bladder spasms. Qty: 20 tablet, Refills: 0      CONTINUE these medications which have CHANGED   Details  metoprolol (LOPRESSOR) 50 MG tablet Take 1 tablet (50 mg total) by mouth 2 (two) times daily. Qty: 60 tablet, Refills: 1      CONTINUE these medications which have NOT CHANGED   Details  albuterol (PROVENTIL HFA;VENTOLIN HFA) 108 (90 Base) MCG/ACT inhaler Inhale 2 puffs into the lungs every 6 (six) hours as needed for wheezing or shortness of breath.    allopurinol (ZYLOPRIM) 100 MG tablet Take 100 mg by mouth 2 (two) times daily.    aspirin 81 MG chewable tablet Chew 81 mg by mouth daily.    butalbital-acetaminophen-caffeine (FIORICET, ESGIC)  50-325-40 MG tablet Take 1 tablet by mouth 2 (two) times daily as needed for headache.    Cholecalciferol (VITAMIN D-3) 1000 units CAPS Take 1 capsule by mouth daily.    clonazePAM (KLONOPIN) 0.5 MG tablet Take 0.5 mg by mouth See admin instructions. Take 2 tablets in the morning, then take one in the afternoon, then take 2 tablets at night per patient    !! cycloSPORINE modified (NEORAL) 100 MG capsule Take 100 mg by mouth 2 (two) times daily. Patient takes along with the 25mg  neoral tablet to make full dose of 125mg  by mouth twice daily per patient    !! cycloSPORINE modified (NEORAL) 25 MG capsule Take 25 mg by mouth 2 (two) times daily. Take along with the 100mg  tablet to make a full dose of 125mg  tablet by mouth  daily per patient    fenofibrate 160 MG tablet Take 160 mg by mouth daily.    flavoxATE (URISPAS) 100 MG tablet Take 100 mg by mouth 3 (three) times daily. Take every day per patient    furosemide (LASIX) 40 MG tablet Take 40 mg by mouth daily.    gabapentin (NEURONTIN) 300 MG capsule Take 300 mg by mouth 3 (three) times daily.    gemfibrozil (LOPID) 600 MG tablet Take 600 mg by mouth 2 (two) times daily before a meal.    Icosapent Ethyl (VASCEPA) 1 g CAPS Take 1 capsule by mouth 2 (two) times daily.    levETIRAcetam (KEPPRA) 500 MG tablet Take 500 mg by mouth 2 (two) times daily.    magnesium oxide (MAG-OX) 400 MG tablet Take 800 mg by mouth 2 (two) times daily.    methylphenidate (RITALIN) 10 MG tablet Take 10 mg by mouth 2 (two) times daily.    norethindrone (MICRONOR,CAMILA,ERRIN) 0.35 MG tablet Take 1 tablet by mouth daily.    omega-3 acid ethyl esters (LOVAZA) 1 g capsule Take 1 g by mouth 3 (three) times daily.    pravastatin (PRAVACHOL) 40 MG tablet Take 40 mg by mouth at bedtime.    predniSONE (DELTASONE) 2.5 MG tablet Take 2.5 mg by mouth daily with breakfast.    prochlorperazine (COMPAZINE) 10 MG tablet Take 10 mg by mouth every 6 (six) hours as needed for nausea or vomiting.    promethazine (PHENERGAN) 25 MG suppository Place 25 mg rectally every 6 (six) hours as needed for nausea or vomiting.    ranitidine (ZANTAC) 150 MG tablet Take 150 mg by mouth every evening.    rizatriptan (MAXALT) 10 MG tablet Take 10 mg by mouth daily as needed for migraine. May repeat in 2 hours if needed    sertraline (ZOLOFT) 100 MG tablet Take 100 mg by mouth 2 (two) times daily.    Teriparatide, Recombinant, (FORTEO) 600 MCG/2.4ML SOLN Inject 20 mcg into the skin daily. Take every day per patient    traMADol (ULTRAM) 50 MG tablet Take 50 mg by mouth every 6 (six) hours as needed for moderate pain.    zolpidem (AMBIEN) 10 MG tablet Take 10 mg by mouth at bedtime as needed for sleep.      !! - Potential duplicate medications found. Please discuss with provider.    STOP taking these medications     clindamycin (CLEOCIN) 300 MG capsule      sodium bicarbonate 325 MG tablet        No Known Allergies   The results of significant diagnostics from this hospitalization (including imaging, microbiology, ancillary and laboratory) are listed below  for reference.    Significant Diagnostic Studies: Ct Abdomen Pelvis Wo Contrast  Result Date: 04/28/2016 CLINICAL DATA:  SHOB AND EDEMA PMH: HTN, Retroperitoneal fibrosis; Renal transplant recipient; Neurogenic bladder; Ovarian cyst; Frequent UTI; Factor V Leiden (HCC); High cholesterol; Pneumonia; On home oxygen therapy; Migraine; Stroke (HCC); Arthritis; Chronic lower back pain; Gout; Anxiety; Depression; Renal transplant failure and rejection; Self-catheterizes urinary bladder EXAM: CT ABDOMEN AND PELVIS WITHOUT CONTRAST TECHNIQUE: Multidetector CT imaging of the abdomen and pelvis was performed following the standard protocol without IV contrast. COMPARISON:  None. FINDINGS: Lower chest: No acute abnormality. Hepatobiliary: Fatty infiltration of the liver. No liver mass or focal lesion. Gallbladder is unremarkable. Common bile duct is dilated to 1 cm but shows normal distal tapering. No duct stone is seen. Pancreas: Unremarkable. No pancreatic ductal dilatation or surrounding inflammatory changes. Spleen: Normal in size without focal abnormality. Adrenals/Urinary Tract: Status post bilateral nephrectomies. Left sided renal transplant kidney with no mass, stone or hydronephrosis. Normal renal transplant ureter. Bladder is mostly decompressed with a Foley catheter. Stomach/Bowel: Stomach is within normal limits. Appendix not visualized. No evidence of bowel wall thickening, distention, or inflammatory changes. Vascular/Lymphatic: No significant vascular findings are present. No enlarged abdominal or pelvic lymph nodes. Reproductive: Uterus  and bilateral adnexa are unremarkable. Other: No ascites Musculoskeletal: No acute or significant osseous findings. IMPRESSION: 1. No acute findings within the abdomen or pelvis. 2. Status post bilateral nephrectomies. Left renal transplant kidney is unremarkable. 3. Hepatic steatosis. Electronically Signed   By: Amie Portland M.D.   On: 04/28/2016 14:08   Dg Chest 2 View  Result Date: 04/28/2016 CLINICAL DATA:  Shortness of Breath EXAM: CHEST  2 VIEW COMPARISON:  None. FINDINGS: Right chest wall port is noted in satisfactory position. Cardiac shadow is mildly enlarged. The lungs are clear bilaterally. No acute bony abnormality is seen. IMPRESSION: No active cardiopulmonary disease. Electronically Signed   By: Alcide Clever M.D.   On: 04/28/2016 14:14   Ct Head Wo Contrast  Result Date: 04/29/2016 CLINICAL DATA:  30 year old female with history of migraines presenting with headache nausea and vomiting which is different than normal migraines. Retroperitoneal fibrosis. Chronic kidney disease. Post renal transplant. Obesity and hypertension. Factor 5 Leiden. Initial encounter. EXAM: CT HEAD WITHOUT CONTRAST TECHNIQUE: Contiguous axial images were obtained from the base of the skull through the vertex without intravenous contrast. COMPARISON:  None. FINDINGS: Brain: No intracranial hemorrhage or CT evidence of large acute infarct. Mild atrophy without hydrocephalus. No intracranial mass lesion noted on this unenhanced exam. Vascular: No hyperdense vessel to indicate acute infarct or dural sinus thrombosis. If this is of high clinical concern MR imaging could be performed. Skull: No skull base abnormality. Sinuses/Orbits: No acute orbital abnormality. Mild exophthalmos. Visualized sinuses and mastoid air cells are clear. Other: Negative IMPRESSION: No intracranial hemorrhage or CT evidence of large acute infarct. No hyperdense vessel to suggest dural sinus thrombosis. If this is of high clinical concern, MR  imaging may then be considered. Mild atrophy. Mild exophthalmos. Electronically Signed   By: Lacy Duverney M.D.   On: 04/29/2016 11:06    Microbiology: Recent Results (from the past 240 hour(s))  CSF culture     Status: None (Preliminary result)   Collection Time: 04/29/16 12:16 PM  Result Value Ref Range Status   Specimen Description CSF  Final   Special Requests Immunocompromised  Final   Gram Stain NO WBC SEEN NO ORGANISMS SEEN CYTOSPIN SMEAR   Final   Culture NO  GROWTH < 24 HOURS  Final   Report Status PENDING  Incomplete     Labs: Basic Metabolic Panel:  Recent Labs Lab 04/25/16 0157  04/27/16 1125 04/28/16 0540 04/29/16 0940 04/29/16 1232 04/30/16 0500  NA 138  < > 138 141 144 143 143  K 3.6  < > 5.6* 4.8 4.3 4.1 4.3  CL 95*  < > 98* 95* 98* 98* 95*  CO2 31  < > 29 36* 35* 34* 36*  GLUCOSE 122*  < > 157* 117* 117* 118* 97  BUN 36*  < > 53* 48* 34* 34* 34*  CREATININE 2.22*  < > 3.08* 2.63* 2.12* 2.02* 2.24*  CALCIUM 8.9  < > 8.5* 8.3* 9.4 9.4 9.6  PHOS 3.6  --  2.4* 7.2* 3.4  --  5.4*  < > = values in this interval not displayed. Liver Function Tests:  Recent Labs Lab 04/27/16 1125 04/28/16 0540 04/29/16 0940 04/29/16 1232 04/30/16 0500  AST  --   --   --  22  --   ALT  --   --   --  17  --   ALKPHOS  --   --   --  33*  --   BILITOT  --   --   --  0.6  --   PROT  --   --   --  6.5  --   ALBUMIN 3.5 3.4* 3.4* 3.2* 3.4*   No results for input(s): LIPASE, AMYLASE in the last 168 hours. No results for input(s): AMMONIA in the last 168 hours. CBC:  Recent Labs Lab 04/25/16 0157 04/29/16 1232  WBC 8.1 6.0  NEUTROABS  --  3.0  HGB 13.6 12.2  HCT 39.3 37.8  MCV 89.5 93.1  PLT 262 232    Signed:  Vassie LollMadera, Finnley Larusso MD.  [facility] 04/30/2016, 3:06 PM

## 2016-04-30 NOTE — Progress Notes (Signed)
Admit: 04/24/2016 LOS: 5  66F s/p KT on CyA/MMF/Pred admit with weight gain and HAs  Subjective:  Neurology following; concern for idiopathic intracranial hypertension, on acetazolamide Good UOP on lasix too PO Stable SCr on IS Pt w/o c/o  10/22 0701 - 10/23 0700 In: 962.3 [P.O.:722; I.V.:240.3] Out: 3150 [Urine:3150]  Filed Weights   04/29/16 0254 04/29/16 2238 04/30/16 0500  Weight: 98.6 kg (217 lb 6 oz) 98.4 kg (216 lb 14.9 oz) 98.4 kg (216 lb 14.9 oz)    Scheduled Meds: . acetaZOLAMIDE  250 mg Oral BID  . allopurinol  100 mg Oral BID  . aspirin  81 mg Oral Daily  . clonazePAM  0.5 mg Oral Q24H  . clonazePAM  1 mg Oral BID  . cycloSPORINE modified  100 mg Oral BID  . cycloSPORINE modified  25 mg Oral Daily  . enoxaparin (LOVENOX) injection  40 mg Subcutaneous Q24H  . famotidine  20 mg Oral Daily  . fenofibrate  160 mg Oral Daily  . flavoxATE  100 mg Oral TID  . furosemide  40 mg Oral Daily  . gabapentin  300 mg Oral TID  . gemfibrozil  600 mg Oral BID AC  . levETIRAcetam  500 mg Oral BID  . magnesium oxide  800 mg Oral BID  . metoprolol tartrate  50 mg Oral BID  . mycophenolate  500 mg Oral BID  . norethindrone  1 tablet Oral Daily  . omega-3 acid ethyl esters  1 g Oral TID  . pravastatin  40 mg Oral QHS  . predniSONE  2.5 mg Oral Q breakfast  . sertraline  100 mg Oral BID  . Teriparatide (Recombinant)  20 mcg Subcutaneous Daily   Continuous Infusions: . sodium chloride 10 mL/hr at 04/29/16 1700   PRN Meds:.acetaminophen **OR** acetaminophen, calcium carbonate (dosed in mg elemental calcium), camphor-menthol **AND** hydrOXYzine, docusate sodium, feeding supplement (NEPRO CARB STEADY), morphine injection, ondansetron **OR** ondansetron (ZOFRAN) IV, oxybutynin, prochlorperazine, promethazine, sodium chloride flush, sorbitol, traMADol, zolpidem  Current Labs: reviewed    Physical Exam:  Blood pressure 107/69, pulse 90, temperature 97.7 F (36.5 C), temperature  source Oral, resp. rate 16, height 5\' 3"  (1.6 m), weight 98.4 kg (216 lb 14.9 oz), last menstrual period 04/08/2016, SpO2 99 %. In chair, using CPAP No LEE RRR CTAB ant auscultation No rashes/lesions AAO  A 1. S/p KT on CyA/Pred/MMF, BL SCr high 1s 2. HAs, ? Intracranial hypertension s/p LP with high OP 29 3. Obesity and rapid weigth gain  P 1. No changes to renal meds 2. Stable GFR 3. Pt will need outpt f/u, will sign off for now   Donna Heckyan Kenly Xiao MD 04/30/2016, 11:58 AM   Recent Labs Lab 04/28/16 0540 04/29/16 0940 04/29/16 1232 04/30/16 0500  NA 141 144 143 143  K 4.8 4.3 4.1 4.3  CL 95* 98* 98* 95*  CO2 36* 35* 34* 36*  GLUCOSE 117* 117* 118* 97  BUN 48* 34* 34* 34*  CREATININE 2.63* 2.12* 2.02* 2.24*  CALCIUM 8.3* 9.4 9.4 9.6  PHOS 7.2* 3.4  --  5.4*    Recent Labs Lab 04/25/16 0157 04/29/16 1232  WBC 8.1 6.0  NEUTROABS  --  3.0  HGB 13.6 12.2  HCT 39.3 37.8  MCV 89.5 93.1  PLT 262 232

## 2016-04-30 NOTE — Progress Notes (Signed)
RN called to patient's room by NT.  During the night, the patient would take her CPAP off.  She is drowsy but easy to arouse.  She would be assisted and encouraged to leave her CPAP on.  At this point, she has taken her CPAP off and her O2 saturation is in high 70's and low 80's.  She is drowsy but able to be aroused.  She continues to drift off.  CPAP reapplied.  Patient strongly encouraged to leave CPAP on while asleep and taking sedative medications.  Will continue to monitor.  [employer] [employer] MinorKimberly Dearis Danis RN-BC, WTA.

## 2016-05-02 LAB — CSF CULTURE: GRAM STAIN: NONE SEEN

## 2016-05-02 LAB — CSF CULTURE W GRAM STAIN: Culture: NO GROWTH

## 2016-05-04 LAB — CULTURE, BLOOD (ROUTINE X 2)
CULTURE: NO GROWTH
Culture: NO GROWTH

## 2016-06-21 ENCOUNTER — Encounter: Payer: Self-pay | Admitting: Neurology

## 2016-06-21 ENCOUNTER — Other Ambulatory Visit: Payer: Self-pay | Admitting: Neurology

## 2016-06-21 ENCOUNTER — Ambulatory Visit (INDEPENDENT_AMBULATORY_CARE_PROVIDER_SITE_OTHER): Payer: Medicare Other | Admitting: Neurology

## 2016-06-21 VITALS — BP 118/79 | HR 92 | Ht 62.0 in | Wt 210.8 lb

## 2016-06-21 DIAGNOSIS — G8929 Other chronic pain: Secondary | ICD-10-CM

## 2016-06-21 DIAGNOSIS — G08 Intracranial and intraspinal phlebitis and thrombophlebitis: Secondary | ICD-10-CM | POA: Diagnosis not present

## 2016-06-21 DIAGNOSIS — R51 Headache with orthostatic component, not elsewhere classified: Secondary | ICD-10-CM

## 2016-06-21 DIAGNOSIS — I669 Occlusion and stenosis of unspecified cerebral artery: Secondary | ICD-10-CM | POA: Diagnosis not present

## 2016-06-21 DIAGNOSIS — Z79899 Other long term (current) drug therapy: Secondary | ICD-10-CM

## 2016-06-21 DIAGNOSIS — H53133 Sudden visual loss, bilateral: Secondary | ICD-10-CM | POA: Diagnosis not present

## 2016-06-21 DIAGNOSIS — H4710 Unspecified papilledema: Secondary | ICD-10-CM | POA: Diagnosis not present

## 2016-06-21 DIAGNOSIS — G4733 Obstructive sleep apnea (adult) (pediatric): Secondary | ICD-10-CM | POA: Diagnosis not present

## 2016-06-21 DIAGNOSIS — H05119 Granuloma of unspecified orbit: Secondary | ICD-10-CM

## 2016-06-21 MED ORDER — ACETAZOLAMIDE 250 MG PO TABS: 500.0000 mg | ORAL_TABLET | Freq: Two times a day (BID) | ORAL | 11 refills | Status: DC

## 2016-06-21 NOTE — Progress Notes (Addendum)
[facility]    Provider:  Dr Lucia GaskinsAhern Referring Provider: Annie SableGoldsborough, [name], MD Primary Care Physician:  No PCP Per Patient  CC:  Papilledema, elevated intracranial pressure  HPI:  Donna Shannon is a 30 y.o. female here as a referral from Dr. Kathrene BongoGoldsborough for papilledema and increased intracranial pressure with intractable headaches. She has a PMHx of retroperitoneal fibrosis and renal transplants, obesity, chronic kidney disease, neurogenic bladder, hypertension, depression, hyperlipidemia, migraines, on home oxygen therapy, and chronic back pain who was admitted 04/24/2016 with concern for volume overload and a 40 pound weight gain in 2 months after starting Lyrica. She reported dyspnea on exertion, pneumonia, increased use of inhaler on admission. She is on CellCept, prednisone and cyclosporine. She has had worsening migraines. She was given Depakote and Imitrex and CPAP while inpatient. Patient reported that headache started several days prior to admission with severe nausea and vomiting. The headaches were different than her normal migraines. She felt pressure all around the head, blurry vision, worse when laying flat, lots of pressure and throbbing, she was also hearing swishing and a heartbeat in her ear, lots of nausea and vomiting and was be a 10 out of 10 in pain. Lumbar puncture was performed in the hospital with an opening pressure which was elevated at 29.  Patient's headache improved after lumbar puncture and she was started on Diamox. CT scan of the head was performed and patient but MRI imaging was not. She was discharged with instructions for follow-up outpatient MRI, MRV as well as sleep evaluation and ophthalmologic follow-up for papilledema. Patient was discharged at the end of October and returns today. She was unable to have any of this workup completed in [place]. She continues to have daily headaches. She is using her father's CPAP machine which I highly  discouraged as this can cause harm to her lungs if the CPAP is not calibrated properly. She reports that she cannot sleep without his CPAP at this point and she wakes with morning severe headaches. She has pulsating, throbbing headache, both sides of the head, pressure. She had a sleep test 4 years ago and diagnosed with OSA but has not followed up with sleep doctor and wearing her father's cpap at this time. Headaches are every day. Wearing the cpap helps, advised not to use other people's cpaps because can cause lung damage. Headaches are daily and can be a 7/10 in pain, she has blurry vision and he vision is changing. They can last all day. Her parents are here and provide more information and patient does. At next visit I will advise that patient's comes in alone for the first 30 minutes.  Reviewed notes, labs and imaging from outside physicians, which showed:   LP 04/29/2016: The patient was prepped and draped, and using sterile technique a 20 gauge quinke spinal needle was inserted in the L3-L4 space. The opening pressure was 29 cm H2O. Approximately 20 cc of CSF were obtained and sent for analysis.  Closing pressure was 13 cm H2O   CT head 04/29/2016: Personally reviewed images and agree with the following  FINDINGS: Brain: No intracranial hemorrhage or CT evidence of large acute infarct.  Mild atrophy without hydrocephalus.  No intracranial mass lesion noted on this unenhanced exam.  Vascular: No hyperdense vessel to indicate acute infarct or dural sinus thrombosis. If this is of high clinical concern MR imaging could be performed.  Skull: No skull base abnormality.  Sinuses/Orbits: No acute orbital abnormality. Mild exophthalmos. Visualized sinuses and mastoid  air cells are clear.  Other: Negative  IMPRESSION: No intracranial hemorrhage or CT evidence of large acute infarct.  No hyperdense vessel to suggest dural sinus thrombosis. If this is of high clinical concern,  MR imaging may then be considered.  Mild atrophy.  Mild exophthalmos. Review of Systems: Patient complains of symptoms per HPI as well as the following symptoms: no CP. [name] Pertinent negatives per HPI. All others negative.   Social History   Social History  . Marital status: Single    Spouse name: N/A  . Number of children: N/A  . Years of education: N/A   Occupational History  . school nurse    Social History Main Topics  . Smoking status: Former Smoker    Packs/day: 0.50    Years: 4.00    Types: Cigarettes    Quit date: 07/09/2014  . Smokeless tobacco: Never Used  . Alcohol use Yes     Comment: 04/24/2016 "might have 2 drinks/month, if that"  . Drug use: No  . Sexual activity: No   Other Topics Concern  . Not on file   Social History Narrative   Lives mother and father   Caffeine use: 1 cup soda/day    Family History  Problem Relation Age of Onset  . CAD Father     s/p CABG    Past Medical History:  Diagnosis Date  . Anxiety   . Arthritis    "left knee" (04/24/2016)  . Chronic lower back pain   . Depression   . Essential hypertension   . Factor V Leiden (HCC)    [name] 04/24/2016  . Frequent UTI    [name] 04/24/2016  . Gout   . High cholesterol   . Migraine    "weekly" (04/24/2016)  . Neurogenic bladder    augmented bladder, I/O self caths  . On home oxygen therapy    "2L; every night" (04/24/2016)  . Ovarian cyst dx'd 04/22/2016   left  . Pneumonia 03/2016   [name] 04/24/2016  . Renal transplant failure and rejection    age 31-11  . 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 Self-catheterizes urinary bladder    "~ 12 X/day" (04/24/2016)  . Stroke [facility]) 1991   denies residual on 04/24/2016    Past Surgical History:  Procedure Laterality Date  . ABDOMINAL HERNIA REPAIR  "several; when I was little"  . allograph biopsy  2013   [name] 04/24/2016  . HERNIA REPAIR    . KIDNEY TRANSPLANT   1996; 1998   father was donor; mother was donor/notes 04/24/2016  . PACEMAKER INSERTION  "?early 2000s"   for bladder function  . PARTIAL NEPHRECTOMY  1990s X 5   "removing disease"  . PORTACATH PLACEMENT Right 11/2015  . RIGHT OOPHORECTOMY Right 2001   ovarian torsion    Current Outpatient Prescriptions  Medication Sig Dispense Refill  . acetaZOLAMIDE (DIAMOX) 250 MG tablet Take 2 tablets (500 mg total) by mouth 2 (two) times daily. 120 tablet 11  . albuterol (PROVENTIL HFA;VENTOLIN HFA) 108 (90 Base) MCG/ACT inhaler Inhale 2 puffs into the lungs every 6 (six) hours as needed for wheezing or shortness of breath.    . allopurinol (ZYLOPRIM) 100 MG tablet Take 100 mg by mouth 2 (two) times daily.    [name] aspirin 81 MG chewable tablet Chew 81 mg by mouth daily.    . butalbital-acetaminophen-caffeine (FIORICET, ESGIC) 50-325-40 MG tablet Take 1 tablet  by mouth 2 (two) times daily as needed for headache.    . Cholecalciferol (VITAMIN D-3) 1000 units CAPS Take 1 capsule by mouth daily.    . clonazePAM (KLONOPIN) 0.5 MG tablet Take 0.5 mg by mouth See admin instructions. Take 2 tablets in the morning, then take one in the afternoon, then take 2 tablets at night per patient    . cycloSPORINE modified (NEORAL) 100 MG capsule Take 100 mg by mouth 2 (two) times daily. Patient takes along with the 25mg  neoral tablet to make full dose of 125mg  by mouth twice daily per patient    . cycloSPORINE modified (NEORAL) 25 MG capsule Take 25 mg by mouth 2 (two) times daily. Take along with the 100mg  tablet to make a full dose of 125mg  tablet by mouth daily per patient    . fenofibrate 160 MG tablet Take 160 mg by mouth daily.    . flavoxATE (URISPAS) 100 MG tablet Take 100 mg by mouth 3 (three) times daily. Take every day per patient    . furosemide (LASIX) 40 MG tablet Take 40 mg by mouth daily.    [name]. gabapentin (NEURONTIN) 300 MG capsule Take 300 mg by mouth 3 (three) times daily.    [name]. gemfibrozil (LOPID) 600 MG  tablet Take 600 mg by mouth 2 (two) times daily before a meal.    . Icosapent Ethyl (VASCEPA) 1 g CAPS Take 1 capsule by mouth 2 (two) times daily.    [name]. levETIRAcetam (KEPPRA) 500 MG tablet Take 500 mg by mouth 2 (two) times daily.    . magnesium oxide (MAG-OX) 400 MG tablet Take 800 mg by mouth 2 (two) times daily.    . methylphenidate (RITALIN) 10 MG tablet Take 10 mg by mouth 2 (two) times daily.    . metoprolol (LOPRESSOR) 50 MG tablet Take 1 tablet (50 mg total) by mouth 2 (two) times daily. 60 tablet 1  . norethindrone (MICRONOR,CAMILA,ERRIN) 0.35 MG tablet Take 1 tablet by mouth daily.    [name]. omega-3 acid ethyl esters (LOVAZA) 1 g capsule Take 1 g by mouth 3 (three) times daily.    [name]. oxybutynin (DITROPAN) 5 MG tablet Take 1 tablet (5 mg total) by mouth every 8 (eight) hours as needed for bladder spasms. 20 tablet 0  . pravastatin (PRAVACHOL) 40 MG tablet Take 40 mg by mouth at bedtime.    . predniSONE (DELTASONE) 2.5 MG tablet Take 2.5 mg by mouth daily with breakfast.    . prochlorperazine (COMPAZINE) 10 MG tablet Take 10 mg by mouth every 6 (six) hours as needed for nausea or vomiting.    . promethazine (PHENERGAN) 25 MG suppository Place 25 mg rectally every 6 (six) hours as needed for nausea or vomiting.    . ranitidine (ZANTAC) 150 MG tablet Take 150 mg by mouth every evening.    . rizatriptan (MAXALT) 10 MG tablet Take 10 mg by mouth daily as needed for migraine. May repeat in 2 hours if needed    . sertraline (ZOLOFT) 100 MG tablet Take 100 mg by mouth 2 (two) times daily.    . Teriparatide, Recombinant, (FORTEO) 600 MCG/2.4ML SOLN Inject 20 mcg into the skin daily. Take every day per patient    . traMADol (ULTRAM) 50 MG tablet Take 50 mg by mouth every 6 (six) hours as needed for moderate pain.    [name]. zolpidem (AMBIEN) 10 MG tablet Take 10 mg by mouth at bedtime as needed for sleep.  No current facility-administered medications for this visit.     Allergies as of 06/21/2016  .  (No Known Allergies)    Vitals: BP 118/79 (BP Location: Right Arm, Patient Position: Sitting, Cuff Size: Normal)   Pulse 92   Ht 5\' 2"  (1.575 m)   Wt 210 lb 12.8 oz (95.6 kg)   BMI 38.56 kg/m  Last Weight:  Wt Readings from Last 1 Encounters:  06/21/16 210 lb 12.8 oz (95.6 kg)   Last Height:   Ht Readings from Last 1 Encounters:  06/21/16 5\' 2"  (1.575 m)    Physical exam: Exam: Gen: NAD, conversant, well nourised, obese, well groomed                     CV: RRR, no MRG. No Carotid Bruits. No peripheral edema, warm, nontender Eyes: Conjunctivae clear without exudates or hemorrhage  Neuro: Detailed Neurologic Exam  Speech:    Speech is normal; fluent and spontaneous with normal comprehension.  Cognition:    The patient is oriented to person, place, and time;     recent and remote memory intact;     language fluent;     normal attention, concentration,     fund of knowledge Cranial Nerves:    The pupils are equal, round, and reactive to light. Mild papilledema both eyes. Visual fields are full to finger confrontation. Extraocular movements are intact. Trigeminal sensation is intact and the muscles of mastication are normal. The face is symmetric. The palate elevates in the midline. Hearing intact. Voice is normal. Shoulder shrug is normal. The tongue has normal motion without fasciculations.   Coordination:    Normal finger to nose and heel to shin. Normal rapid alternating movements.   Gait:    Heel-toe and tandem gait are normal.   Motor Observation:    No asymmetry, no atrophy, and no involuntary movements noted. Tone:    Normal muscle tone.    Posture:    Posture is normal. normal erect    Strength:    Strength is V/V in the upper and lower limbs.      Sensation: intact to LT     Reflex Exam:  DTR's:    Deep tendon reflexes in the upper and lower extremities are normal bilaterally.   Toes:    The toes are downgoing bilaterally.   Clonus:    Clonus is  absent.      Assessment/Plan:    Donna Shannon is a 31 y.o. female here as a referral from Dr. Kathrene Bongo for papilledema and increased intracranial pressure with intractable headaches. She has a PMHx of retroperitoneal fibrosis and renal transplants, obesity, chronic kidney disease, neurogenic bladder, hypertension, depression, hyperlipidemia, migraines, on home oxygen therapy, and chronic back pain who was admitted 04/24/2016 with concern for volume overload and a 40 pound weight gain in 2 months after starting Lyrica.   - Patient with vision loss, possible mild papilledema, intractable headache, increased opening pressure on LP was 29 needs MRIs to rule out lesions, other causes of increased pressure such as space occupyibg lesions. MRI orbits to eval for orbital pseudotumor. MRV head for cerebral thrombosis - Ophthalmology referral: We have secured an appointment with Dr. Dione Booze next week, highly appreciate Dr. Dione Booze seeing this patient so quickly. - Sleep evaluation: Patient diagnosed with sleep apnea 4 years ago and never followed up. At this time she is using her father's CPAP which I have highly discouraged (see history of present illness). We have secured  her an appointment with one of the sleep doctors in our group on Monday, she needs a sleep evaluation for obstructive sleep apnea and hypoventilation syndrome as clincially warranted by our sleep specialist. She is obese, has intractable headache, headaches are improved with CPAP, she also has morning headaches and daytime somnolence. - Crcl 54, we'll check labs today and increase her acetazolamide (update, creatinine improved to 1.98 CrCl >61 now. Will keep to BID dosing) - f/u 6-8 weeks  Discussed: To prevent or relieve headaches, try the following: Cool Compress. Lie down and place a cool compress on your head.  Avoid headache triggers. If certain foods or odors seem to have triggered your migraines in the past, avoid them. A headache  diary might help you identify triggers.  Include physical activity in your daily routine. Try a daily walk or other moderate aerobic exercise.  Manage stress. Find healthy ways to cope with the stressors, such as delegating tasks on your to-do list.  Practice relaxation techniques. Try deep breathing, yoga, massage and visualization.  Eat regularly. Eating regularly scheduled meals and maintaining a healthy diet might help prevent headaches. Also, drink plenty of fluids.  Follow a regular sleep schedule. Sleep deprivation might contribute to headaches Consider biofeedback. With this mind-body technique, you learn to control certain bodily functions - such as muscle tension, heart rate and blood pressure - to prevent headaches or reduce headache pain.    Proceed to emergency room if you experience new or worsening symptoms or symptoms do not resolve, if you have new neurologic symptoms or if headache is severe, or for any concerning symptom.    Naomie Dean, MD  [facility] [address] [place], [place] [address]  Phone [phone] Fax [phone]  A total of 80 minutes was spent face-to-face with this patient. Over half this time was spent on counseling patient on the intractable headache, papilledema, OSA vs hypoventilation syndrome diagnosis and different diagnostic and therapeutic options available.

## 2016-06-21 NOTE — Patient Instructions (Signed)
Remember to drink plenty of fluid, eat healthy meals and do not skip any meals. Try to eat protein with a every meal and eat a healthy snack such as fruit or nuts in between meals. Try to keep a regular sleep-wake schedule and try to exercise daily, particularly in the form of walking, 20-30 minutes a day, if you can.   As far as your medications are concerned, I would like to suggest: Increase acetazolamide to 500mg  twice daily  As far as diagnostic testing: MRI of the brain and orbits, MRV head, ophthalmology referral, sleep evaluation  I would like to see you back in 6 weeks, sooner if we need to. Please call us with any interim questions, concerns, problems, updates or refill requests.   Our phone number is [phone]. We also have an after hours call service for urgent matters and there is a physician on-call for urgent questions. For any emergencies you know to call 911 or go to the nearest emergency room  Idiopathic Intracranial Hypertension Idiopathic intracranial hypertension (IIH) is a neurologic disorder that leads to increased pressure around your brain. It can cause vision loss and blindness if left untreated. RISK FACTORS IIH is most common in very overweight (obese) women of childbearing age. SIGNS AND SYMPTOMS  Symptoms of IIH include:  Headache.  Feeling of sickness in your stomach (nausea).  Vomiting.  A "rushing of water" sound within your ears (pulsatile tinnitus).  Double vision. DIAGNOSIS  Idiopathic intracranial hypertension is diagnosed with the aid of different exams:  Brain scans such as:  CT.  MRI.  MRV.  Diagnostic lumbar puncture. This procedure can determine if there is too much spinal fluid within the central nervous system. Too much spinal fluid can increase intracranial pressure.  A thorough eye exam will be done to look for swelling within the eyes. Visual field testing will also be done to see if any damage has occurred to nerves in the  eyes. TREATMENT  Treatment of idiopathic intracranial hypertension is based on symptoms. Common treatments include:  Lumbar puncture to remove excess spinal fluid.  Medicine.  Surgery. HOME CARE INSTRUCTIONS The most important thing anyone can do to improve this condition is lose weight if they are overweight.  SEEK MEDICAL CARE IF:  You have changes in vision.  You have double vision.  You have loss of color vision. SEEK IMMEDIATE MEDICAL CARE IF:   Your headaches get worse rather than better.  Nausea or vomiting or both continue after treatment.  Your vision does not improve or gets worse after treatment. MAKE SURE YOU:  Understand these instructions.  Will watch your condition.  Will get help right away if you are not doing well or get worse. This information is not intended to replace advice given to you by your health care provider. Make sure you discuss any questions you have with your health care provider. Document Released: 09/03/2001 Document Revised: 06/30/2013 Document Reviewed: 03/02/2013 Elsevier Interactive Patient Education  2017 [employer]

## 2016-06-25 ENCOUNTER — Encounter: Payer: Self-pay | Admitting: Neurology

## 2016-06-25 ENCOUNTER — Ambulatory Visit (INDEPENDENT_AMBULATORY_CARE_PROVIDER_SITE_OTHER): Payer: Medicare Other | Admitting: Neurology

## 2016-06-25 ENCOUNTER — Other Ambulatory Visit: Payer: Self-pay | Admitting: Neurology

## 2016-06-25 ENCOUNTER — Telehealth: Payer: Self-pay

## 2016-06-25 VITALS — BP 110/82 | HR 92 | Resp 18 | Ht 62.0 in | Wt 204.0 lb

## 2016-06-25 DIAGNOSIS — N135 Crossing vessel and stricture of ureter without hydronephrosis: Secondary | ICD-10-CM | POA: Insufficient documentation

## 2016-06-25 DIAGNOSIS — R0902 Hypoxemia: Secondary | ICD-10-CM | POA: Diagnosis not present

## 2016-06-25 DIAGNOSIS — G43711 Chronic migraine without aura, intractable, with status migrainosus: Secondary | ICD-10-CM | POA: Diagnosis not present

## 2016-06-25 DIAGNOSIS — G4733 Obstructive sleep apnea (adult) (pediatric): Secondary | ICD-10-CM

## 2016-06-25 DIAGNOSIS — I669 Occlusion and stenosis of unspecified cerebral artery: Secondary | ICD-10-CM | POA: Diagnosis not present

## 2016-06-25 DIAGNOSIS — G44221 Chronic tension-type headache, intractable: Secondary | ICD-10-CM

## 2016-06-25 DIAGNOSIS — K682 Retroperitoneal fibrosis: Secondary | ICD-10-CM

## 2016-06-25 MED ORDER — RIZATRIPTAN BENZOATE 10 MG PO TABS: 10.0000 mg | ORAL_TABLET | Freq: Every day | ORAL | 6 refills | Status: DC | PRN

## 2016-06-25 MED ORDER — PROCHLORPERAZINE MALEATE 10 MG PO TABS: 10.0000 mg | ORAL_TABLET | Freq: Four times a day (QID) | ORAL | 6 refills | Status: DC | PRN

## 2016-06-25 MED ORDER — PROMETHAZINE HCL 25 MG RE SUPP: 25.0000 mg | Freq: Four times a day (QID) | RECTAL | 6 refills | Status: DC | PRN

## 2016-06-25 NOTE — Telephone Encounter (Signed)
Please see refill request below. Patient will be seen by Dr. Vickey Hugerohmeier soon but is asking for a migraine infusion after the visit. What do you recommend?

## 2016-06-25 NOTE — Telephone Encounter (Signed)
Patient is aware. Thank you.

## 2016-06-25 NOTE — Telephone Encounter (Signed)
Patient was seen today by Dr. Vickey Hugerohmeier, she is requesting refill of Fioricet, maxalt, and phenergan by mouth.

## 2016-06-25 NOTE — Telephone Encounter (Signed)
I refilled the maxalt, compazine and phenergan. I would prefer her not to use fioricet for headache, thanks

## 2016-06-25 NOTE — Progress Notes (Signed)
SLEEP MEDICINE CLINIC   Provider:  Melvyn Novas, M D  Referring Provider: Dr Marjory Sneddon  Primary Care Physician:  No PCP Per Patient  Chief Complaint  Patient presents with  . Sleep Consult    Rm 10. Patient had sleep study many years ago. She was not put on CPAP. Patient has trouble staying asleep, snores, witnessed apnea, wakes up feeling tired, morning headaches, daytime fatigue. She was put on CPAP while in hospital and her migraines were relieved.     HPI:  Donna Shannon is a 30 y.o. female , seen here as a referral/ revisit  from Dr. Lucia Gaskins,   Chief complaint according to patient : Lots of Headaches, Papilledema, chronic migraines.   I have pleasure of seeing Donna Shannon today, in referral from my partner Dr. Lucia Gaskins. She has a very extensive past medical history and current medical history of 2 renal parents found so she got her first transplant 20 years ago lost it after a year and a second transplant living donor organ 19 years ago. This kidney has done very well. Her current creatinine is lower than 2. She has however retro- peritoneal fibrosis. As a history of neurogenic bladder, hypertension, depression, hyperlipidemia, and is on home oxygen therapy. She has chronic back pain. She was admitted to the hospital with volume overload on 04/24/2016 after starting Lyrica which caused a 40 pound weight gain mostly in fluid-water. At the time she developed dyspnea on exertion. She remains on CellCept, prednisone and cyclosporine but she has worsening migraines she was given Depakote and Imitrex, but in the hospital she was placed on CPAP and noted a resolution of her migrainous headaches. She was unable to arrange for sleep study in her home town of [place]. She has been using her father CPAP machine. She has a pulsating, throbbing headache a pressure that affects both sides of the head and is sometimes unbearable. Dr. Lucia Gaskins had reviewed her CT of the head, results of an  LP performed on 04/29/2016, I reviewed her medication list, the patient supposedly suffered a stroke in 1991. She is on oxygen therapy every night at 2 L nasal cannula.  Sleep habits are as follows: Goes to bed at 9 PM, she takes Ambien which allows her to go to sleep promptly -after she takes her nocturnal dose of Diamox she takes Diamox twice a day, she takes Lasix in the morning, She still has nocturia 3-4 times each night. She usually sleeps with the TV running in the background, she also uses a ceiling fan. She falls asleep on her side, she sleeps usually on her right, she will hug pillow has one pillow under her head and neck support. 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  Sleep medical history and family sleep history:  Brother committed suicide. 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 She was with her parents.   Social history:  Single, lives with parents, her father is a father Stage manager.  She smoked for 7 years but quit many years ago, she does not drink alcohol, she drinks caffeine in form of soda, [employer], suite ice tea, no coffee.  Review of Systems: Out of a complete 14 system review, the patient complains of only the following symptoms, and all other reviewed systems are negative.   Epworth score 11 , Fatigue severity score 38 ,  depression score 5-15   Social History   Social History  . Marital status: Single    Spouse name: N/A  . Number of children: N/A  . Years of education: N/A   Occupational History  . school nurse    Social History Main Topics  . Smoking status: Former Smoker    Packs/day: 0.50    Years: 4.00    Types: Cigarettes    Quit date: 07/09/2014  . Smokeless tobacco: Never Used  . Alcohol use Yes     Comment: 04/24/2016 "might have 2 drinks/month, if that"  . Drug use: No  . Sexual activity: No   Other Topics Concern  . Not on file    Social History Narrative   Lives mother and father   Caffeine use: 1 cup soda/day       Family History  Problem Relation Age of Onset  . CAD Father     s/p CABG    Past Medical History:  Diagnosis Date  . Anxiety   . Arthritis    "left knee" (04/24/2016)  . Chronic lower back pain   . Depression   . Essential hypertension   . Factor V Leiden (HCC)    [name] 04/24/2016  . Frequent UTI    [name] 04/24/2016  . Gout   . High cholesterol   . Migraine    "weekly" (04/24/2016)  . Neurogenic bladder    augmented bladder, I/O self caths  . On home oxygen therapy    "2L; every night" (04/24/2016)  . Ovarian cyst dx'd 04/22/2016   left  . Pneumonia 03/2016   [name] 04/24/2016  . Renal transplant failure and rejection    age 8-11  . 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 Self-catheterizes urinary bladder    "~ 12 X/day" (04/24/2016)  . Stroke [facility]) 1991   denies residual on 04/24/2016    Past Surgical History:  Procedure Laterality Date  . ABDOMINAL HERNIA REPAIR  "several; when I was little"  . allograph biopsy  2013   [name] 04/24/2016  . HERNIA REPAIR    . KIDNEY TRANSPLANT  1996; 1998   father was donor; mother was donor/notes 04/24/2016  . PACEMAKER INSERTION  "?early 2000s"   for bladder function  . PARTIAL NEPHRECTOMY  1990s X 5   "removing disease"  . PORTACATH PLACEMENT Right 11/2015  . RIGHT OOPHORECTOMY Right 2001   ovarian torsion    Current Outpatient Prescriptions  Medication Sig Dispense Refill  . acetaZOLAMIDE (DIAMOX) 250 MG tablet Take 2 tablets (500 mg total) by mouth 2 (two) times daily. 120 tablet 11  . albuterol (PROVENTIL HFA;VENTOLIN HFA) 108 (90 Base) MCG/ACT inhaler Inhale 2 puffs into the lungs every 6 (six) hours as needed for wheezing or shortness of breath.    . allopurinol (ZYLOPRIM) 100 MG tablet Take 100 mg by mouth 2 (two) times daily.    [name] aspirin 81 MG chewable tablet  Chew 81 mg by mouth daily.    . butalbital-acetaminophen-caffeine (FIORICET, ESGIC) 50-325-40 MG tablet Take 1 tablet by mouth 2 (two) times daily as needed for headache.    . Cholecalciferol (VITAMIN D-3) 1000 units CAPS Take 1 capsule by mouth daily.    . clonazePAM (KLONOPIN) 0.5 MG tablet Take 0.5 mg by mouth See admin instructions. Take 2 tablets in the morning, then take one in the afternoon, then take 2 tablets at night per patient    .  cycloSPORINE modified (NEORAL) 100 MG capsule Take 100 mg by mouth 2 (two) times daily. Patient takes along with the 25mg  neoral tablet to make full dose of 125mg  by mouth twice daily per patient    . cycloSPORINE modified (NEORAL) 25 MG capsule Take 25 mg by mouth 2 (two) times daily. Take along with the 100mg  tablet to make a full dose of 125mg  tablet by mouth daily per patient    . fenofibrate 160 MG tablet Take 160 mg by mouth daily.    . flavoxATE (URISPAS) 100 MG tablet Take 100 mg by mouth 3 (three) times daily. Take every day per patient    . furosemide (LASIX) 40 MG tablet Take 40 mg by mouth daily.    [name]. gabapentin (NEURONTIN) 300 MG capsule Take 300 mg by mouth 3 (three) times daily.    [name]. gemfibrozil (LOPID) 600 MG tablet Take 600 mg by mouth 2 (two) times daily before a meal.    . Icosapent Ethyl (VASCEPA) 1 g CAPS Take 1 capsule by mouth 2 (two) times daily.    [name]. levETIRAcetam (KEPPRA) 500 MG tablet Take 500 mg by mouth 2 (two) times daily.    . magnesium oxide (MAG-OX) 400 MG tablet Take 800 mg by mouth 2 (two) times daily.    . methylphenidate (RITALIN) 10 MG tablet Take 10 mg by mouth 2 (two) times daily.    . metoprolol (LOPRESSOR) 50 MG tablet Take 1 tablet (50 mg total) by mouth 2 (two) times daily. 60 tablet 1  . norethindrone (MICRONOR,CAMILA,ERRIN) 0.35 MG tablet Take 1 tablet by mouth daily.    [name]. omega-3 acid ethyl esters (LOVAZA) 1 g capsule Take 1 g by mouth 3 (three) times daily.    [name]. oxybutynin (DITROPAN) 5 MG tablet Take 1 tablet (5  mg total) by mouth every 8 (eight) hours as needed for bladder spasms. 20 tablet 0  . pravastatin (PRAVACHOL) 40 MG tablet Take 40 mg by mouth at bedtime.    . predniSONE (DELTASONE) 2.5 MG tablet Take 2.5 mg by mouth daily with breakfast.    . prochlorperazine (COMPAZINE) 10 MG tablet Take 10 mg by mouth every 6 (six) hours as needed for nausea or vomiting.    . promethazine (PHENERGAN) 25 MG suppository Place 25 mg rectally every 6 (six) hours as needed for nausea or vomiting.    . ranitidine (ZANTAC) 150 MG tablet Take 150 mg by mouth every evening.    . rizatriptan (MAXALT) 10 MG tablet Take 10 mg by mouth daily as needed for migraine. May repeat in 2 hours if needed    . sertraline (ZOLOFT) 100 MG tablet Take 100 mg by mouth 2 (two) times daily.    . Teriparatide, Recombinant, (FORTEO) 600 MCG/2.4ML SOLN Inject 20 mcg into the skin daily. Take every day per patient    . traMADol (ULTRAM) 50 MG tablet Take 50 mg by mouth every 6 (six) hours as needed for moderate pain.    [name]. zolpidem (AMBIEN) 10 MG tablet Take 10 mg by mouth at bedtime as needed for sleep.     No current facility-administered medications for this visit.     Allergies as of 06/25/2016 - Review Complete 06/25/2016  Allergen Reaction Noted  . Sulfa antibiotics Itching 06/25/2016    Vitals: BP 110/82   Pulse 92   Resp 18   Ht 5\' 2"  (1.575 m)   Wt 204 lb (92.5 kg)   BMI 37.31 kg/m  Last Weight:  Wt  Readings from Last 1 Encounters:  06/25/16 204 lb (92.5 kg)   ZOX:WRUE mass index is 37.31 kg/m.     Last Height:   Ht Readings from Last 1 Encounters:  06/25/16 5\' 2"  (1.575 m)    Physical exam:  General: The patient is awake, alert and appears not in acute distress. The patient is well groomed. Head: Normocephalic, atraumatic. Neck is supple. Mallampati 4  neck circumference:19 Nasal airflow patent ,  Retrognathia is not seen.  Cardiovascular:  Regular rate and rhythm , without  murmurs or carotid bruit, and  without distended neck veins. Respiratory: Lungs are clear to auscultation. Skin:  Without evidence of edema, or rash Trunk: BMI is 37 *. The patient's posture is stooped   Neurologic exam : The patient is awake and alert, oriented to place and time.   Memory subjective  described as intact.    Attention span & concentration ability appears normal.  Speech is fluent,  without  dysarthria, dysphonia or aphasia.  Mood and affect are appropriate.  Cranial nerves: Pupils are equal and briskly reactive to light. Extraocular movements  in vertical and horizontal planes intact and without nystagmus. Visual fields by finger perimetry are intact. Hearing to finger rub intact.   Facial sensation intact to fine touch.  Facial motor strength is symmetric and tongue and uvula move midline. Shoulder shrug was symmetrical.   Motor exam:  Normal tone, muscle bulk and symmetric strength in all extremities.  Finger-to-nose maneuver associated with tremor and all 10 fingers.  Gait and station: Patient walks without assistive device and is able unassisted to climb up to the exam table. Strength within normal limits.  Stance is stable and normal. Deep tendon reflexes: in the  upper and lower extremities are symmetric and intact. Babinski maneuver response is downgoing.  The patient was advised of the nature of the diagnosed sleep disorder , the treatment options and risks for general a health and wellness arising from not treating the condition.  I spent more than 35 minutes of face to face time with the patient. Greater than 50% of time was spent in counseling and coordination of care. We have discussed the diagnosis and differential and I answered the patient's questions.     Assessment:  After physical and neurologic examination, review of laboratory studies,  Personal review of imaging studies, reports of other /same  Imaging studies ,  Results of polysomnography/ neurophysiology testing and pre-existing  records as far as provided in visit., my assessment is   1) Donna Shannon does have high risk of having developed sleep apnea, and hypoxemia and hypercapnia are also likely present given her elevated body mass index. Due to the medication she has to take to prevent organ rejection she has also changed her body habitus. I would like for her to have an attended split study, with a desensitization mask fit. I think that the patient will be easy to get used to CPAP as she has used it during a recent hospital stay and did not develop claustrophobia or any other problems with it. She developed renal failure as a child, she has been over 20 years ago transplant with her first kidney and 19 years ago was her second. She developed fluid retention in October 2017 and needed to go on twice a day Diamox plus Lasix. It was at that time that papilledema was found, and her headaches have worsened since.   I appreciate Dr. Trevor Mace referral I would like to add that the  patient endorsed the Epworth sleepiness score at 11 points of fatigue severity score at 38 points.  Plan:  Treatment plan and additional workup :     Melvyn Novasarmen Jamilynn Whitacre MD  06/25/2016

## 2016-06-30 ENCOUNTER — Other Ambulatory Visit: Payer: Medicare Other

## 2016-07-06 ENCOUNTER — Ambulatory Visit (HOSPITAL_COMMUNITY): Payer: Medicare Other

## 2016-07-06 ENCOUNTER — Ambulatory Visit (HOSPITAL_COMMUNITY)
Admission: RE | Admit: 2016-07-06 | Discharge: 2016-07-06 | Disposition: A | Discharge status: Home / Self Care | Payer: Medicare Other | Source: Ambulatory Visit | Attending: Neurology | Admitting: Neurology

## 2016-07-06 DIAGNOSIS — G08 Intracranial and intraspinal phlebitis and thrombophlebitis: Secondary | ICD-10-CM

## 2016-07-06 DIAGNOSIS — R51 Headache with orthostatic component, not elsewhere classified: Secondary | ICD-10-CM

## 2016-07-06 DIAGNOSIS — H05119 Granuloma of unspecified orbit: Secondary | ICD-10-CM | POA: Diagnosis not present

## 2016-07-06 DIAGNOSIS — G8929 Other chronic pain: Secondary | ICD-10-CM

## 2016-07-06 DIAGNOSIS — H53133 Sudden visual loss, bilateral: Secondary | ICD-10-CM | POA: Diagnosis not present

## 2016-07-06 DIAGNOSIS — R519 Headache, unspecified: Secondary | ICD-10-CM

## 2016-07-06 DIAGNOSIS — H4710 Unspecified papilledema: Secondary | ICD-10-CM

## 2016-07-07 LAB — COMPREHENSIVE METABOLIC PANEL
ALBUMIN: 4.5 g/dL (ref 3.5–5.5)
ALT: 17 IU/L (ref 0–32)
AST: 23 IU/L (ref 0–40)
Albumin/Globulin Ratio: 1.7 (ref 1.2–2.2)
Alkaline Phosphatase: 43 IU/L (ref 39–117)
BILIRUBIN TOTAL: 0.3 mg/dL (ref 0.0–1.2)
BUN / CREAT RATIO: 18 (ref 9–23)
BUN: 36 mg/dL — ABNORMAL HIGH (ref 6–20)
CO2: 23 mmol/L (ref 18–29)
Calcium: 9.3 mg/dL (ref 8.7–10.2)
Chloride: 98 mmol/L (ref 96–106)
Creatinine, Ser: 1.98 mg/dL — ABNORMAL HIGH (ref 0.57–1.00)
GFR calc Af Amer: 38 mL/min/{1.73_m2} — ABNORMAL LOW (ref >59)
GFR calc non Af Amer: 33 mL/min/{1.73_m2} — ABNORMAL LOW (ref >59)
Globulin, Total: 2.6 g/dL (ref 1.5–4.5)
Glucose: 101 mg/dL — ABNORMAL HIGH (ref 65–99)
POTASSIUM: 3.5 mmol/L (ref 3.5–5.2)
Sodium: 140 mmol/L (ref 134–144)
Total Protein: 7.1 g/dL (ref 6.0–8.5)

## 2016-07-07 LAB — ACETAZOLAMIDE, (DIAMOX), S/P: Acetazolamide: 41.2 ug/ml — ABNORMAL HIGH

## 2016-07-07 LAB — CBC
Hematocrit: 34.4 % (ref 34.0–46.6)
Hemoglobin: 12 g/dL (ref 11.1–15.9)
MCH: 31 pg (ref 26.6–33.0)
MCHC: 34.9 g/dL (ref 31.5–35.7)
MCV: 89 fL (ref 79–97)
PLATELETS: 228 10*3/uL (ref 150–379)
RBC: 3.87 x10E6/uL (ref 3.77–5.28)
RDW: 15 % (ref 12.3–15.4)
WBC: 7.4 10*3/uL (ref 3.4–10.8)

## 2016-07-09 ENCOUNTER — Telehealth: Payer: Self-pay | Admitting: Diagnostic Neuroimaging

## 2016-07-09 NOTE — Telephone Encounter (Signed)
Pt mother called and is feeling overwhelmed. Pt continues to have headaches, vision changes, swelling, cannot drive or work. Gave pt mother the mri brain, mrv and mri orbits results --> normal. Will forward information to Dr. Lucia GaskinsAhern to review next week. -VRP

## 2016-07-10 NOTE — Telephone Encounter (Signed)
She has a sleep study in a few days, untreated sleep apnea could be causing her headaches an dthe sleep test will be very important. I also recommend another LP to check opening pressure, please discuss and if she is willing we will set it up hopefull for next week depending on availability (send me a message and I will order it) for her but I think the sleep evaluation is very important. If the sleep apnea is not treated appropriately then this could be a big contributor to her headaches. We will get the lumbar puncture to see if the pressure is still high. thanks

## 2016-07-11 ENCOUNTER — Emergency Department (HOSPITAL_COMMUNITY): Payer: Medicare Other

## 2016-07-11 ENCOUNTER — Telehealth: Payer: Self-pay | Admitting: Diagnostic Neuroimaging

## 2016-07-11 ENCOUNTER — Encounter (HOSPITAL_COMMUNITY): Payer: Self-pay | Admitting: Adult Health

## 2016-07-11 ENCOUNTER — Inpatient Hospital Stay (HOSPITAL_COMMUNITY)
Admission: EM | Admit: 2016-07-11 | Discharge: 2016-07-15 | DRG: 299 | Disposition: A | Discharge status: Home / Self Care | Payer: Medicare Other | Attending: Internal Medicine | Admitting: Internal Medicine

## 2016-07-11 DIAGNOSIS — Z79899 Other long term (current) drug therapy: Secondary | ICD-10-CM | POA: Diagnosis not present

## 2016-07-11 DIAGNOSIS — J9621 Acute and chronic respiratory failure with hypoxia: Secondary | ICD-10-CM | POA: Diagnosis not present

## 2016-07-11 DIAGNOSIS — Z87891 Personal history of nicotine dependence: Secondary | ICD-10-CM

## 2016-07-11 DIAGNOSIS — Z7982 Long term (current) use of aspirin: Secondary | ICD-10-CM | POA: Diagnosis not present

## 2016-07-11 DIAGNOSIS — Z8673 Personal history of transient ischemic attack (TIA), and cerebral infarction without residual deficits: Secondary | ICD-10-CM | POA: Diagnosis not present

## 2016-07-11 DIAGNOSIS — Z6836 Body mass index (BMI) 36.0-36.9, adult: Secondary | ICD-10-CM | POA: Diagnosis not present

## 2016-07-11 DIAGNOSIS — M810 Age-related osteoporosis without current pathological fracture: Secondary | ICD-10-CM | POA: Diagnosis present

## 2016-07-11 DIAGNOSIS — F32A Depression, unspecified: Secondary | ICD-10-CM | POA: Diagnosis present

## 2016-07-11 DIAGNOSIS — G932 Benign intracranial hypertension: Secondary | ICD-10-CM | POA: Diagnosis present

## 2016-07-11 DIAGNOSIS — G4700 Insomnia, unspecified: Secondary | ICD-10-CM | POA: Diagnosis present

## 2016-07-11 DIAGNOSIS — J9622 Acute and chronic respiratory failure with hypercapnia: Secondary | ICD-10-CM | POA: Diagnosis not present

## 2016-07-11 DIAGNOSIS — G8929 Other chronic pain: Secondary | ICD-10-CM

## 2016-07-11 DIAGNOSIS — G4733 Obstructive sleep apnea (adult) (pediatric): Secondary | ICD-10-CM | POA: Diagnosis present

## 2016-07-11 DIAGNOSIS — N390 Urinary tract infection, site not specified: Secondary | ICD-10-CM | POA: Diagnosis present

## 2016-07-11 DIAGNOSIS — J9859 Other diseases of mediastinum, not elsewhere classified: Secondary | ICD-10-CM

## 2016-07-11 DIAGNOSIS — R601 Generalized edema: Secondary | ICD-10-CM | POA: Diagnosis present

## 2016-07-11 DIAGNOSIS — D6851 Activated protein C resistance: Secondary | ICD-10-CM | POA: Diagnosis present

## 2016-07-11 DIAGNOSIS — N319 Neuromuscular dysfunction of bladder, unspecified: Secondary | ICD-10-CM | POA: Diagnosis present

## 2016-07-11 DIAGNOSIS — G92 Toxic encephalopathy: Secondary | ICD-10-CM | POA: Diagnosis present

## 2016-07-11 DIAGNOSIS — Z905 Acquired absence of kidney: Secondary | ICD-10-CM | POA: Diagnosis not present

## 2016-07-11 DIAGNOSIS — G43711 Chronic migraine without aura, intractable, with status migrainosus: Secondary | ICD-10-CM | POA: Diagnosis present

## 2016-07-11 DIAGNOSIS — Z9981 Dependence on supplemental oxygen: Secondary | ICD-10-CM | POA: Diagnosis not present

## 2016-07-11 DIAGNOSIS — I5032 Chronic diastolic (congestive) heart failure: Secondary | ICD-10-CM | POA: Diagnosis present

## 2016-07-11 DIAGNOSIS — I5033 Acute on chronic diastolic (congestive) heart failure: Secondary | ICD-10-CM

## 2016-07-11 DIAGNOSIS — R51 Headache: Secondary | ICD-10-CM

## 2016-07-11 DIAGNOSIS — Z940 Kidney transplant status: Secondary | ICD-10-CM | POA: Diagnosis not present

## 2016-07-11 DIAGNOSIS — Z7952 Long term (current) use of systemic steroids: Secondary | ICD-10-CM | POA: Diagnosis not present

## 2016-07-11 DIAGNOSIS — Z881 Allergy status to other antibiotic agents status: Secondary | ICD-10-CM

## 2016-07-11 DIAGNOSIS — Z87440 Personal history of urinary (tract) infections: Secondary | ICD-10-CM

## 2016-07-11 DIAGNOSIS — M109 Gout, unspecified: Secondary | ICD-10-CM | POA: Diagnosis present

## 2016-07-11 DIAGNOSIS — I10 Essential (primary) hypertension: Secondary | ICD-10-CM | POA: Diagnosis not present

## 2016-07-11 DIAGNOSIS — Z882 Allergy status to sulfonamides status: Secondary | ICD-10-CM

## 2016-07-11 DIAGNOSIS — M545 Low back pain: Secondary | ICD-10-CM | POA: Diagnosis present

## 2016-07-11 DIAGNOSIS — F419 Anxiety disorder, unspecified: Secondary | ICD-10-CM | POA: Diagnosis present

## 2016-07-11 DIAGNOSIS — F329 Major depressive disorder, single episode, unspecified: Secondary | ICD-10-CM | POA: Diagnosis present

## 2016-07-11 DIAGNOSIS — N183 Chronic kidney disease, stage 3 unspecified: Secondary | ICD-10-CM | POA: Diagnosis present

## 2016-07-11 DIAGNOSIS — R519 Headache, unspecified: Secondary | ICD-10-CM | POA: Diagnosis present

## 2016-07-11 DIAGNOSIS — I871 Compression of vein: Secondary | ICD-10-CM

## 2016-07-11 DIAGNOSIS — I130 Hypertensive heart and chronic kidney disease with heart failure and stage 1 through stage 4 chronic kidney disease, or unspecified chronic kidney disease: Secondary | ICD-10-CM | POA: Diagnosis present

## 2016-07-11 DIAGNOSIS — B9620 Unspecified Escherichia coli [E. coli] as the cause of diseases classified elsewhere: Secondary | ICD-10-CM | POA: Diagnosis present

## 2016-07-11 DIAGNOSIS — Z90721 Acquired absence of ovaries, unilateral: Secondary | ICD-10-CM

## 2016-07-11 DIAGNOSIS — R609 Edema, unspecified: Secondary | ICD-10-CM | POA: Diagnosis not present

## 2016-07-11 DIAGNOSIS — E669 Obesity, unspecified: Secondary | ICD-10-CM | POA: Diagnosis present

## 2016-07-11 DIAGNOSIS — H93A1 Pulsatile tinnitus, right ear: Secondary | ICD-10-CM | POA: Diagnosis present

## 2016-07-11 DIAGNOSIS — Z793 Long term (current) use of hormonal contraceptives: Secondary | ICD-10-CM

## 2016-07-11 DIAGNOSIS — Z8249 Family history of ischemic heart disease and other diseases of the circulatory system: Secondary | ICD-10-CM

## 2016-07-11 LAB — I-STAT CHEM 8, ED
BUN: 41 mg/dL — ABNORMAL HIGH (ref 6–20)
CALCIUM ION: 1.23 mmol/L (ref 1.15–1.40)
CHLORIDE: 104 mmol/L (ref 101–111)
Creatinine, Ser: 2.4 mg/dL — ABNORMAL HIGH (ref 0.44–1.00)
Glucose, Bld: 120 mg/dL — ABNORMAL HIGH (ref 65–99)
HCT: 34 % — ABNORMAL LOW (ref 36.0–46.0)
Hemoglobin: 11.6 g/dL — ABNORMAL LOW (ref 12.0–15.0)
Potassium: 3.5 mmol/L (ref 3.5–5.1)
Sodium: 140 mmol/L (ref 135–145)
TCO2: 28 mmol/L (ref 0–100)

## 2016-07-11 LAB — CBC WITH DIFFERENTIAL/PLATELET
BASOS ABS: 0 10*3/uL (ref 0.0–0.1)
BASOS PCT: 0 %
Eosinophils Absolute: 0.1 10*3/uL (ref 0.0–0.7)
Eosinophils Relative: 1 %
HCT: 36.2 % (ref 36.0–46.0)
Hemoglobin: 11.5 g/dL — ABNORMAL LOW (ref 12.0–15.0)
Lymphocytes Relative: 40 %
Lymphs Abs: 2.5 10*3/uL (ref 0.7–4.0)
MCH: 30.3 pg (ref 26.0–34.0)
MCHC: 31.8 g/dL (ref 30.0–36.0)
MCV: 95.3 fL (ref 78.0–100.0)
MONO ABS: 0.4 10*3/uL (ref 0.1–1.0)
Monocytes Relative: 7 %
NEUTROS ABS: 3.2 10*3/uL (ref 1.7–7.7)
Neutrophils Relative %: 52 %
Platelets: 196 10*3/uL (ref 150–400)
RBC: 3.8 MIL/uL — ABNORMAL LOW (ref 3.87–5.11)
RDW: 14.1 % (ref 11.5–15.5)
WBC: 6.3 10*3/uL (ref 4.0–10.5)

## 2016-07-11 LAB — COMPREHENSIVE METABOLIC PANEL
ALBUMIN: 4 g/dL (ref 3.5–5.0)
ALT: 16 U/L (ref 14–54)
AST: 24 U/L (ref 15–41)
Alkaline Phosphatase: 36 U/L — ABNORMAL LOW (ref 38–126)
Anion gap: 12 (ref 5–15)
BUN: 39 mg/dL — ABNORMAL HIGH (ref 6–20)
CO2: 26 mmol/L (ref 22–32)
Calcium: 9.5 mg/dL (ref 8.9–10.3)
Chloride: 101 mmol/L (ref 101–111)
Creatinine, Ser: 2.28 mg/dL — ABNORMAL HIGH (ref 0.44–1.00)
GFR calc Af Amer: 32 mL/min — ABNORMAL LOW (ref >60)
GFR calc non Af Amer: 27 mL/min — ABNORMAL LOW (ref >60)
Glucose, Bld: 117 mg/dL — ABNORMAL HIGH (ref 65–99)
POTASSIUM: 3.4 mmol/L — AB (ref 3.5–5.1)
Sodium: 139 mmol/L (ref 135–145)
Total Bilirubin: 1 mg/dL (ref 0.3–1.2)
Total Protein: 7.1 g/dL (ref 6.5–8.1)

## 2016-07-11 LAB — BRAIN NATRIURETIC PEPTIDE: B Natriuretic Peptide: 7.3 pg/mL (ref 0.0–100.0)

## 2016-07-11 MED ORDER — MAGNESIUM OXIDE 400 (241.3 MG) MG PO TABS
800.0000 mg | ORAL_TABLET | Freq: Two times a day (BID) | ORAL | Status: DC
  Administered 2016-07-12 – 2016-07-15 (×8): 800 mg via ORAL
  Filled 2016-07-11 (×9): qty 2

## 2016-07-11 MED ORDER — HYDROMORPHONE HCL 2 MG/ML IJ SOLN
1.0000 mg | Freq: Once | INTRAMUSCULAR | Status: AC
  Administered 2016-07-11: 1 mg via INTRAVENOUS
  Filled 2016-07-11: qty 1

## 2016-07-11 MED ORDER — SERTRALINE HCL 100 MG PO TABS
100.0000 mg | ORAL_TABLET | Freq: Two times a day (BID) | ORAL | Status: DC
  Administered 2016-07-12 – 2016-07-15 (×7): 100 mg via ORAL
  Filled 2016-07-11 (×9): qty 1

## 2016-07-11 MED ORDER — FUROSEMIDE 10 MG/ML IJ SOLN
40.0000 mg | Freq: Once | INTRAMUSCULAR | Status: AC
  Administered 2016-07-12: 40 mg via INTRAVENOUS
  Filled 2016-07-11: qty 4

## 2016-07-11 MED ORDER — ONDANSETRON HCL 4 MG/2ML IJ SOLN
4.0000 mg | Freq: Once | INTRAMUSCULAR | Status: AC
  Administered 2016-07-11: 4 mg via INTRAVENOUS
  Filled 2016-07-11: qty 2

## 2016-07-11 MED ORDER — HEPARIN SODIUM (PORCINE) 5000 UNIT/ML IJ SOLN
5000.0000 [IU] | Freq: Three times a day (TID) | INTRAMUSCULAR | Status: DC
  Administered 2016-07-12 (×3): 5000 [IU] via SUBCUTANEOUS
  Filled 2016-07-11 (×3): qty 1

## 2016-07-11 MED ORDER — CYCLOSPORINE MODIFIED (NEORAL) 100 MG PO CAPS: 100.0000 mg | ORAL_CAPSULE | Freq: Two times a day (BID) | ORAL | Status: DC

## 2016-07-11 MED ORDER — GEMFIBROZIL 600 MG PO TABS
600.0000 mg | ORAL_TABLET | Freq: Every day | ORAL | Status: DC
  Administered 2016-07-14 – 2016-07-15 (×2): 600 mg via ORAL
  Filled 2016-07-11 (×4): qty 1

## 2016-07-11 MED ORDER — METOPROLOL TARTRATE 50 MG PO TABS
50.0000 mg | ORAL_TABLET | Freq: Two times a day (BID) | ORAL | Status: DC
  Administered 2016-07-12 – 2016-07-15 (×8): 50 mg via ORAL
  Filled 2016-07-11 (×9): qty 1

## 2016-07-11 MED ORDER — ACETAMINOPHEN 325 MG PO TABS: 650.0000 mg | ORAL_TABLET | Freq: Four times a day (QID) | ORAL | Status: DC | PRN

## 2016-07-11 MED ORDER — CYCLOSPORINE MODIFIED (NEORAL) 25 MG PO CAPS: 25.0000 mg | ORAL_CAPSULE | Freq: Two times a day (BID) | ORAL | Status: DC

## 2016-07-11 MED ORDER — CYCLOSPORINE MODIFIED (NEORAL) 25 MG PO CAPS
125.0000 mg | ORAL_CAPSULE | Freq: Two times a day (BID) | ORAL | Status: DC
  Administered 2016-07-12 – 2016-07-15 (×8): 125 mg via ORAL
  Filled 2016-07-11 (×9): qty 5

## 2016-07-11 MED ORDER — SODIUM CHLORIDE 0.9 % IV SOLN
250.0000 mL | INTRAVENOUS | Status: DC | PRN
  Administered 2016-07-12: 250 mL via INTRAVENOUS

## 2016-07-11 MED ORDER — SODIUM CHLORIDE 0.9 % IV SOLN: INTRAVENOUS | Status: DC

## 2016-07-11 MED ORDER — ALBUTEROL SULFATE (2.5 MG/3ML) 0.083% IN NEBU: 3.0000 mL | INHALATION_SOLUTION | RESPIRATORY_TRACT | Status: DC | PRN

## 2016-07-11 MED ORDER — CLONAZEPAM 0.5 MG PO TABS: 0.5000 mg | ORAL_TABLET | Freq: Three times a day (TID) | ORAL | Status: DC | PRN

## 2016-07-11 MED ORDER — SODIUM CHLORIDE 0.9% FLUSH: 3.0000 mL | INTRAVENOUS | Status: DC | PRN

## 2016-07-11 MED ORDER — TRAMADOL HCL 50 MG PO TABS: 50.0000 mg | ORAL_TABLET | Freq: Four times a day (QID) | ORAL | Status: DC | PRN

## 2016-07-11 MED ORDER — ASPIRIN 81 MG PO CHEW
81.0000 mg | CHEWABLE_TABLET | Freq: Every day | ORAL | Status: DC
  Administered 2016-07-12 – 2016-07-15 (×4): 81 mg via ORAL
  Filled 2016-07-11 (×4): qty 1

## 2016-07-11 MED ORDER — FUROSEMIDE 40 MG PO TABS
40.0000 mg | ORAL_TABLET | Freq: Every day | ORAL | Status: DC
  Filled 2016-07-11: qty 1

## 2016-07-11 MED ORDER — LEVETIRACETAM 500 MG PO TABS
500.0000 mg | ORAL_TABLET | Freq: Two times a day (BID) | ORAL | Status: DC
  Administered 2016-07-12 – 2016-07-15 (×8): 500 mg via ORAL
  Filled 2016-07-11 (×8): qty 1

## 2016-07-11 MED ORDER — ZOLPIDEM TARTRATE 5 MG PO TABS: 5.0000 mg | ORAL_TABLET | Freq: Every evening | ORAL | Status: DC | PRN

## 2016-07-11 MED ORDER — NORETHINDRONE 0.35 MG PO TABS
1.0000 | ORAL_TABLET | Freq: Every day | ORAL | Status: DC
Start: 2016-07-12 — End: 2016-07-13

## 2016-07-11 MED ORDER — SODIUM CHLORIDE 0.9% FLUSH
3.0000 mL | Freq: Two times a day (BID) | INTRAVENOUS | Status: DC
  Administered 2016-07-14 (×2): 3 mL via INTRAVENOUS

## 2016-07-11 MED ORDER — FENOFIBRATE 160 MG PO TABS: 160.0000 mg | ORAL_TABLET | Freq: Every day | ORAL | Status: DC

## 2016-07-11 MED ORDER — FLAVOXATE HCL 100 MG PO TABS
100.0000 mg | ORAL_TABLET | Freq: Three times a day (TID) | ORAL | Status: DC
  Administered 2016-07-12 – 2016-07-15 (×11): 100 mg via ORAL
  Filled 2016-07-11 (×13): qty 1

## 2016-07-11 MED ORDER — VITAMIN D 1000 UNITS PO TABS
1000.0000 [IU] | ORAL_TABLET | Freq: Every day | ORAL | Status: DC
  Administered 2016-07-13 – 2016-07-15 (×3): 1000 [IU] via ORAL
  Filled 2016-07-11 (×4): qty 1

## 2016-07-11 MED ORDER — TERIPARATIDE (RECOMBINANT) 600 MCG/2.4ML ~~LOC~~ SOLN: 20.0000 ug | Freq: Every day | SUBCUTANEOUS | Status: DC

## 2016-07-11 MED ORDER — PREDNISONE 2.5 MG PO TABS
2.5000 mg | ORAL_TABLET | Freq: Every day | ORAL | Status: DC
  Administered 2016-07-12 – 2016-07-15 (×3): 2.5 mg via ORAL
  Filled 2016-07-11 (×4): qty 1

## 2016-07-11 MED ORDER — ACETAMINOPHEN 325 MG PO TABS
650.0000 mg | ORAL_TABLET | ORAL | Status: DC | PRN
  Administered 2016-07-12 – 2016-07-13 (×3): 650 mg via ORAL
  Filled 2016-07-11 (×3): qty 2

## 2016-07-11 MED ORDER — ONDANSETRON HCL 4 MG/2ML IJ SOLN: 4.0000 mg | Freq: Four times a day (QID) | INTRAMUSCULAR | Status: DC | PRN

## 2016-07-11 MED ORDER — GABAPENTIN 300 MG PO CAPS
300.0000 mg | ORAL_CAPSULE | Freq: Two times a day (BID) | ORAL | Status: DC
  Administered 2016-07-12: 300 mg via ORAL
  Filled 2016-07-11: qty 1

## 2016-07-11 MED ORDER — FAMOTIDINE IN NACL 20-0.9 MG/50ML-% IV SOLN
20.0000 mg | Freq: Two times a day (BID) | INTRAVENOUS | Status: DC
  Administered 2016-07-12 – 2016-07-13 (×4): 20 mg via INTRAVENOUS
  Filled 2016-07-11 (×4): qty 50

## 2016-07-11 MED ORDER — ACETAZOLAMIDE 250 MG PO TABS
500.0000 mg | ORAL_TABLET | Freq: Two times a day (BID) | ORAL | Status: DC
  Administered 2016-07-12 – 2016-07-15 (×8): 500 mg via ORAL
  Filled 2016-07-11 (×9): qty 2

## 2016-07-11 MED ORDER — HYDROMORPHONE HCL 2 MG PO TABS: 2.0000 mg | ORAL_TABLET | ORAL | Status: DC | PRN

## 2016-07-11 MED ORDER — SODIUM CHLORIDE 0.9% FLUSH
10.0000 mL | INTRAVENOUS | Status: DC | PRN
Start: 2016-07-11 — End: 2016-07-15
  Administered 2016-07-11: 30 mL

## 2016-07-11 MED ORDER — OXYBUTYNIN CHLORIDE 5 MG PO TABS
5.0000 mg | ORAL_TABLET | Freq: Three times a day (TID) | ORAL | Status: DC | PRN
  Administered 2016-07-14: 5 mg via ORAL
  Filled 2016-07-11: qty 1

## 2016-07-11 MED ORDER — PRAVASTATIN SODIUM 40 MG PO TABS
40.0000 mg | ORAL_TABLET | Freq: Every day | ORAL | Status: DC
  Administered 2016-07-12 – 2016-07-14 (×4): 40 mg via ORAL
  Filled 2016-07-11 (×4): qty 1

## 2016-07-11 MED ORDER — POTASSIUM CHLORIDE CRYS ER 20 MEQ PO TBCR
40.0000 meq | EXTENDED_RELEASE_TABLET | Freq: Once | ORAL | Status: AC
  Administered 2016-07-12: 40 meq via ORAL
  Filled 2016-07-11: qty 2

## 2016-07-11 MED ORDER — ICOSAPENT ETHYL 1 G PO CAPS: 1.0000 | ORAL_CAPSULE | Freq: Two times a day (BID) | ORAL | Status: DC

## 2016-07-11 MED ORDER — ALLOPURINOL 100 MG PO TABS
100.0000 mg | ORAL_TABLET | Freq: Two times a day (BID) | ORAL | Status: DC
  Administered 2016-07-12 – 2016-07-15 (×8): 100 mg via ORAL
  Filled 2016-07-11 (×9): qty 1

## 2016-07-11 MED ORDER — OMEGA-3-ACID ETHYL ESTERS 1 G PO CAPS
1.0000 g | ORAL_CAPSULE | Freq: Two times a day (BID) | ORAL | Status: DC
  Administered 2016-07-12 – 2016-07-15 (×6): 1 g via ORAL
  Filled 2016-07-11 (×7): qty 1

## 2016-07-11 NOTE — Telephone Encounter (Signed)
Donna MeadEmma, let the patient know her labs came back (acetazolamide level took a long time, just came back) and her acetazolamide level was elevated but likely because she had taken a dose that morning (was not a true trough level). Still, they should evaluate her dosing at the hospital. thanks

## 2016-07-11 NOTE — Telephone Encounter (Signed)
I spoke to the ED charge Koula earlier this afternoon and gave her a heads up about Ms. Donna Shannon and requested ER physician evaluation and neurological consult if necessary. 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

## 2016-07-11 NOTE — ED Notes (Signed)
IV team at the bedside. 

## 2016-07-11 NOTE — Telephone Encounter (Signed)
Called and spoke with mother. Relayed [facility] Dr Pear[facility] Brown[facility]ethi[facility] take patient to ER to be evaluated. She may need to be admitted for further work up. Patient mother stated "What are they going to do there. [facility] Advised per Dr Pearlean BrownieSethi, [facility], he can speak with Dr Chari ManningMcneal who is on call there about pt. She declined saying "I don't know Dr Pearlean BrownieSethi. I dont understand why I cannot speak with Dr Lucia GaskinsAhern, her Dr. She knows her and told me to call if anything happens.". Advised DR Lucia GaskinsAhern out of the office until tomorrow. She is not available to speak with. Offered again for Dr Pearlean BrownieSethi to call to discuss.  She stated "I don't understand why she cannot see Dr Lucia GaskinsAhern". Advised AA,MD has no availability and because her condition has worsened, Dr Pearlean BrownieSethi recommended she go to hospital since she is so bad.  Mother verbalized understanding but stated she doesn't know what else to do. Advised I will send message to Dr Pearlean BrownieSethi to call to advise.

## 2016-07-11 NOTE — Telephone Encounter (Signed)
Dr Pearlean BrownieSethi- here is the phone note we discussed, thank you

## 2016-07-11 NOTE — Telephone Encounter (Signed)
Pt father and mother called in because they were confused about eval and plan today for ER eval and possible admission. I  spent over 20 minutes on this phone call politely listening to their complaint..  They were asked to bring pt to ER today by Dr. Pearlean BrownieSethi. Upon arriving, they were surprised that Dr. Pearlean BrownieSethi nor Dr. Lucia GaskinsAhern nor any other [facility] physician were not going to come in to treat pt. They did not understand the [facility] and neurohospitalist system or our system of communication and collaboration. Pt father and mother expressed how upset they were and that they were "lied to" about the nature and plan of this ER visit. I expressed my apologies over the confusion, but also noted that I would share this information with Dr. Pearlean BrownieSethi and Dr. Lucia GaskinsAhern.   For now, I recommend that they stay in ER for evaluation by ER doctor (who can review our [facility] notes in EPIC), then possible evaluation by medical and neurology hospitalists on call, and then defer mgmt to them for eval, treatment and admission options.   Suanne MarkerVIKRAM R. PENUMALLI, MD 07/11/2016, 8:33 PM Certified in Neurology, Neurophysiology and Neuroimaging  [facility] [address], [address] [place], [place] [address] [phone]

## 2016-07-11 NOTE — H&P (Signed)
History and Physical    Donna Shannon ZDG:[id number] DOB: 1985/09/29 DOA: 07/11/2016  PCP: No PCP Per Patient   Patient coming from: Home  Chief Complaint: Headache, blurred vision, N/V  HPI: Donna Shannon is a 31 y.o. female with medical history significant for obesity, hypertension, chronic migraine, idiopathic intracranial hypertension, depression, anxiety, neurogenic bladder, and chronic kidney disease stage III involving her transplant kidney, now presenting to the emergency department at the direction of her neurologist for evaluation of intractable headaches with blurred vision and nausea/vomiting. She reports chronic daily headaches for the past month with blurred vision and vomiting in the morning upon wakening. Symptoms have steadily progressed, leading to her calling the neurology clinic for advice. She denies any recent fevers or chills, productive cough, rash, wound, dysuria, or flank pain. She does note some mild exertional dyspnea. She reports increased swelling "all over," developing over the past month. This is despite her use of diuretics. She reports a very similar situation in October 2017, and reports that her symptoms subsided for a while after a lumbar puncture was performed. Opening pressure was reportedly 30 at that time and it was reportedly lowered to 13. She denies any focal numbness or weakness and denies any recent fall or trauma.  ED Course: Upon arrival to the ED, patient is found to be afebrile, saturating well on room air, tachycardic in the 120s, and with vitals otherwise stable. EKG features a sinus tachycardia with rate 112, and chest x-rays notable for cardiomegaly with mild interstitial edema. CT of the head without contrast reveals a normal brain. Chemistry panels notable for a potassium of 3.4, BUN of 39, and serum creatinine of 2.2 weight, slightly up from her apparent baseline of 2. CBC is notable for a mild normocytic anemia with hemoglobin 11.5. Patient was  treated with Dilaudid and Zofran in the emergency department. Nephrology was consulted by the ED physician and will see the patient in consultation. Neurologist was also consulted by the ED physician and has evaluated the patient in the emergency department. A medical admission is been advised for further evaluation and management of intractable headache with history of idiopathic intracranial hypertension.  Review of Systems:  All other systems reviewed and apart from HPI, are negative.  Past Medical History:  Diagnosis Date  . Anxiety   . Arthritis    "left knee" (04/24/2016)  . Chronic lower back pain   . Depression   . Essential hypertension   . Factor V Leiden (HCC)    [name] 04/24/2016  . Frequent UTI    [name] 04/24/2016  . Gout   . High cholesterol   . Migraine    "weekly" (04/24/2016)  . Neurogenic bladder    augmented bladder, I/O self caths  . On home oxygen therapy    "2L; every night" (04/24/2016)  . Ovarian cyst dx'd 04/22/2016   left  . Pneumonia 03/2016   [name] 04/24/2016  . Renal transplant failure and rejection    age 31-11  . 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 Self-catheterizes urinary bladder    "~ 12 X/day" (04/24/2016)  . Stroke [facility]) 1991   denies residual on 04/24/2016    Past Surgical History:  Procedure Laterality Date  . ABDOMINAL HERNIA REPAIR  "several; when I was little"  . allograph biopsy  2013   [name] 04/24/2016  . HERNIA REPAIR    . KIDNEY TRANSPLANT  1996; 1998   father was  donor; mother was donor/notes 04/24/2016  . PACEMAKER INSERTION  "?early 2000s"   for bladder function  . PARTIAL NEPHRECTOMY  1990s X 5   "removing disease"  . PORTACATH PLACEMENT Right 11/2015  . RIGHT OOPHORECTOMY Right 2001   ovarian torsion     reports that she quit smoking about 2 years ago. Her smoking use included Cigarettes. She has a 2.00 pack-year smoking history. She has never used  smokeless tobacco. She reports that she drinks alcohol. She reports that she does not use drugs.  Allergies  Allergen Reactions  . Sulfa Antibiotics Itching    Family History  Problem Relation Age of Onset  . CAD Father     s/p CABG     Prior to Admission medications   Medication Sig Start Date End Date Taking? Authorizing Provider  acetaZOLAMIDE (DIAMOX) 250 MG tablet Take 2 tablets (500 mg total) by mouth 2 (two) times daily. 06/21/16  Yes Anson Fret, MD  albuterol (PROVENTIL HFA;VENTOLIN HFA) 108 (90 Base) MCG/ACT inhaler Inhale 2 puffs into the lungs every 6 (six) hours as needed for wheezing or shortness of breath.   Yes Historical Provider, MD  allopurinol (ZYLOPRIM) 100 MG tablet Take 100 mg by mouth 2 (two) times daily.   Yes Historical Provider, MD  aspirin 81 MG chewable tablet Chew 81 mg by mouth daily.   Yes Historical Provider, MD  butalbital-acetaminophen-caffeine (FIORICET, ESGIC) 50-325-40 MG tablet Take 1 tablet by mouth 2 (two) times daily as needed for headache.   Yes Historical Provider, MD  Cholecalciferol (VITAMIN D-3) 1000 units CAPS Take 1 capsule by mouth daily.   Yes Historical Provider, MD  clonazePAM (KLONOPIN) 0.5 MG tablet Take 0.5 mg by mouth See admin instructions. Take 2 tablets in the morning, then take one in the afternoon, then take 2 tablets at night per patient   Yes Historical Provider, MD  cycloSPORINE modified (NEORAL) 100 MG capsule Take 100 mg by mouth 2 (two) times daily. Patient takes along with the 25mg  neoral tablet to make full dose of 125mg  by mouth twice daily per patient   Yes Historical Provider, MD  cycloSPORINE modified (NEORAL) 25 MG capsule Take 25 mg by mouth 2 (two) times daily. Take along with the 100mg  tablet to make a full dose of 125mg  tablet by mouth daily per patient   Yes Historical Provider, MD  fenofibrate 160 MG tablet Take 160 mg by mouth daily.   Yes Historical Provider, MD  flavoxATE (URISPAS) 100 MG tablet Take 100  mg by mouth 3 (three) times daily. Take every day per patient   Yes Historical Provider, MD  furosemide (LASIX) 40 MG tablet Take 40 mg by mouth daily.   Yes Historical Provider, MD  gabapentin (NEURONTIN) 300 MG capsule Take 300 mg by mouth 2 (two) times daily.    Yes Historical Provider, MD  gemfibrozil (LOPID) 600 MG tablet Take 600 mg by mouth daily.    Yes Historical Provider, MD  Icosapent Ethyl (VASCEPA) 1 g CAPS Take 1 capsule by mouth 2 (two) times daily.   Yes Historical Provider, MD  levETIRAcetam (KEPPRA) 500 MG tablet Take 500 mg by mouth 2 (two) times daily.   Yes Historical Provider, MD  magnesium oxide (MAG-OX) 400 MG tablet Take 800 mg by mouth 2 (two) times daily.   Yes Historical Provider, MD  methylphenidate (RITALIN) 10 MG tablet Take 10 mg by mouth 2 (two) times daily.   Yes Historical Provider, MD  metoprolol (LOPRESSOR) 50  MG tablet Take 1 tablet (50 mg total) by mouth 2 (two) times daily. 04/30/16  Yes Vassie Loll, MD  norethindrone (MICRONOR,CAMILA,ERRIN) 0.35 MG tablet Take 1 tablet by mouth daily.   Yes Historical Provider, MD  omega-3 acid ethyl esters (LOVAZA) 1 g capsule Take 1 g by mouth 3 (three) times daily.   Yes Historical Provider, MD  oxybutynin (DITROPAN) 5 MG tablet Take 1 tablet (5 mg total) by mouth every 8 (eight) hours as needed for bladder spasms. 04/30/16  Yes Vassie Loll, MD  pravastatin (PRAVACHOL) 40 MG tablet Take 40 mg by mouth at bedtime.   Yes Historical Provider, MD  predniSONE (DELTASONE) 2.5 MG tablet Take 2.5 mg by mouth daily with breakfast.   Yes Historical Provider, MD  prochlorperazine (COMPAZINE) 10 MG tablet Take 1 tablet (10 mg total) by mouth every 6 (six) hours as needed for nausea or vomiting. 06/25/16  Yes Anson Fret, MD  promethazine (PHENERGAN) 25 MG suppository Place 1 suppository (25 mg total) rectally every 6 (six) hours as needed for nausea or vomiting. 06/25/16  Yes Anson Fret, MD  ranitidine (ZANTAC) 150 MG  tablet Take 150 mg by mouth every evening.   Yes Historical Provider, MD  rizatriptan (MAXALT) 10 MG tablet Take 1 tablet (10 mg total) by mouth daily as needed for migraine. May repeat in 2 hours if needed 06/25/16  Yes Anson Fret, MD  sertraline (ZOLOFT) 100 MG tablet Take 100 mg by mouth 2 (two) times daily.   Yes Historical Provider, MD  Teriparatide, Recombinant, (FORTEO) 600 MCG/2.4ML SOLN Inject 20 mcg into the skin daily. Take every day per patient   Yes Historical Provider, MD  traMADol (ULTRAM) 50 MG tablet Take 50 mg by mouth every 6 (six) hours as needed for moderate pain.   Yes Historical Provider, MD  zolpidem (AMBIEN) 10 MG tablet Take 10 mg by mouth at bedtime as needed for sleep.   Yes Historical Provider, MD    Physical Exam: Vitals:   07/11/16 2015 07/11/16 2030 07/11/16 2215 07/11/16 2220  BP: 115/94 121/85 117/77   Pulse: 116 114 112 115  Resp: 26 15 15 20   Temp:      TempSrc:      SpO2: 91% 93% (!) 89% 95%  Weight:          Constitutional: NAD, calm, comfortable, obese Eyes: PERTLA, lids and conjunctivae normal ENMT: Mucous membranes are moist. Posterior pharynx clear of any exudate or lesions.   Neck: normal, supple, no masses, no thyromegaly Respiratory: Fine crackles in bilateral bases. Normal respiratory effort. No accessory muscle use.  Cardiovascular: Rate ~110 and regular with no appreciable murmur. 1+ pretibial edema bilaterally. JVD difficult to discern d/t pt body habitus. Abdomen: No distension, no tenderness, no masses palpated. Bowel sounds normal.  Musculoskeletal: no clubbing / cyanosis. No joint deformity upper and lower extremities. Normal muscle tone.  Skin: no significant rashes, lesions, ulcers. Warm, dry, well-perfused. Neurologic: CN 2-12 grossly intact. Sensation intact, DTR normal. Strength 5/5 in all 4 limbs.  Psychiatric: Normal judgment and insight. Alert and oriented x 3. Normal mood and affect.     Labs on Admission: I have  personally reviewed following labs and imaging studies  CBC:  Recent Labs Lab 07/11/16 1846 07/11/16 1913  WBC 6.3  --   NEUTROABS 3.2  --   HGB 11.5* 11.6*  HCT 36.2 34.0*  MCV 95.3  --   PLT 196  --    Basic  Metabolic Panel:  Recent Labs Lab 07/11/16 1846 07/11/16 1913  NA 139 140  K 3.4* 3.5  CL 101 104  CO2 26  --   GLUCOSE 117* 120*  BUN 39* 41*  CREATININE 2.28* 2.40*  CALCIUM 9.5  --    GFR: Estimated Creatinine Clearance: 35.2 mL/min (by C-G formula based on SCr of 2.4 mg/dL (H)). Liver Function Tests:  Recent Labs Lab 07/11/16 1846  AST 24  ALT 16  ALKPHOS 36*  BILITOT 1.0  PROT 7.1  ALBUMIN 4.0   No results for input(s): LIPASE, AMYLASE in the last 168 hours. No results for input(s): AMMONIA in the last 168 hours. Coagulation Profile: No results for input(s): INR, PROTIME in the last 168 hours. Cardiac Enzymes: No results for input(s): CKTOTAL, CKMB, CKMBINDEX, TROPONINI in the last 168 hours. BNP (last 3 results) No results for input(s): PROBNP in the last 8760 hours. HbA1C: No results for input(s): HGBA1C in the last 72 hours. CBG: No results for input(s): GLUCAP in the last 168 hours. Lipid Profile: No results for input(s): CHOL, HDL, LDLCALC, TRIG, CHOLHDL, LDLDIRECT in the last 72 hours. Thyroid Function Tests: No results for input(s): TSH, T4TOTAL, FREET4, T3FREE, THYROIDAB in the last 72 hours. Anemia Panel: No results for input(s): VITAMINB12, FOLATE, FERRITIN, TIBC, IRON, RETICCTPCT in the last 72 hours. Urine analysis: No results found for: COLORURINE, APPEARANCEUR, LABSPEC, PHURINE, GLUCOSEU, HGBUR, BILIRUBINUR, KETONESUR, PROTEINUR, UROBILINOGEN, NITRITE, LEUKOCYTESUR Sepsis Labs: @LABRCNTIP (procalcitonin:4,lacticidven:4) )No results found for this or any previous visit (from the past 240 hour(s)).   Radiological Exams on Admission: Ct Head Wo Contrast  Result Date: 07/11/2016 CLINICAL DATA:  Headache for 3 months. EXAM: CT  HEAD WITHOUT CONTRAST TECHNIQUE: Contiguous axial images were obtained from the base of the skull through the vertex without intravenous contrast. COMPARISON:  04/29/2016 FINDINGS: Brain: There is no intracranial hemorrhage, mass or evidence of acute infarction. There is no extra-axial fluid collection. Gray matter and white matter appear normal. Cerebral volume is normal for age. Brainstem and posterior fossa are unremarkable. The CSF spaces appear normal. Vascular: No hyperdense vessel or unexpected calcification. Skull: Normal. Negative for fracture or focal lesion. Sinuses/Orbits: No acute finding. Other: None. IMPRESSION: Normal brain Electronically Signed   By: Ellery Plunkaniel R Mitchell M.D.   On: 07/11/2016 21:06   Dg Chest Port 1 View  Result Date: 07/11/2016 CLINICAL DATA:  Edema and migraine headache.  Renal failure. EXAM: PORTABLE CHEST 1 VIEW COMPARISON:  Two-view chest x-ray 04/28/2016 FINDINGS: A right IJ double-lumen Port-A-Cath extends into the right atrium. Heart is enlarged. Mild edema is present. No definite lesions are present. Mild bibasilar airspace disease likely reflects atelectasis. IMPRESSION: 1. Cardiomegaly with mild interstitial edema. 2. No focal airspace disease. 3. Stable right IJ Port-A-Cath. Electronically Signed   By: Marin Robertshristopher  Mattern M.D.   On: 07/11/2016 20:15    EKG: Independently reviewed. Sinus tachycardia (rate 112)   Assessment/Plan  1. Intractable headache  - Pt presents with her typical headache, but has been progressively worsening over the past month  - Head CT reveals normal brain and no focal neurologic deficits are appreciated on exam  - She has hx of idiopathic intracranial hypertension and reports temporary relief of these same sxs in October 2017 with LP  - Neurology is consulting and much appreciated, will follow-up recommendations   2. Acute on chronic diastolic CHF  - Pt appears to have excess volume on admission  - Wt is stable, but there is  peripheral edema, crackles, and  interstitial edema noted on CXR  - TTE (04/28/16) with EF 60-65%, grade 2 diastolic dysfunction, mild LVH, and mild TR  - She is managed at home with Lasix 40 mg qD, metoprolol, and Diamox - She will be given a Lasix 40 mg IVP x1 on admission; plan to resume her usual PO Lasix in am  - SLIV, fluid-restrict diet, follow daily wts and I/Os   3. CKD stage III; s/p renal transplantation - SCr is 2.28 on admission; baseline appears to be ~2  - She is on her second related living donor transplanted kidney from related living donor, this one placed in late 1990's  - Continue anti-rejection medications  - Nephrology is consulting and much appreciated; will follow-up on recommendations    4. Hypertension  - BP at goal on admission  - Continue current management with Lopressor and Lasix as tolerated   5. OSA - Continue CPAP qHS   6. Anxiety, insomnia  - Stable  - Continue Klonopin, Ambien    DVT prophylaxis: sq heparin  Code Status: Full  Family Communication: Father updated at bedside with patient's permission Disposition Plan: Admit to telemetry Consults called: Neurology, nephrololgy Admission status: Inpatient    Briscoe Deutscher, MD [facility] Pager [phone]  If 7PM-7AM, please contact night-coverage [email or web address] Password [email or web address]  07/11/2016, 10:41 PM

## 2016-07-11 NOTE — Telephone Encounter (Signed)
Dr Pearlean BrownieSethi- here is the charge nurse phone number in ED: [phone] for you to call as requested, thank you

## 2016-07-11 NOTE — Telephone Encounter (Signed)
Per Dr Lucia GaskinsAhern- okay to see pt 12pm on Friday. They need to check in by 1130am at the latest because our doors close at 12pm. Dr Lucia GaskinsAhern would like to speak with the patient alone first to evaluate her and then the parents can come back with her per AA,MD.

## 2016-07-11 NOTE — Telephone Encounter (Signed)
Patients mother calling again stating patients headaches have gotten really bad causing nausea/vomiting.  Patient is lethargic, BP has been up and down.  Patient has been sleeping anywhere from 16-17 hrs.  Per mother breathing is bad.  Please call

## 2016-07-11 NOTE — ED Notes (Signed)
O2 sat noted to drop when patient is asleep.  Placed on 2L [place] per admitting physician request.  Pt wears the same at home at night.

## 2016-07-11 NOTE — Telephone Encounter (Signed)
I spoke to the patient's mother who informed me that the patient has been sick for several days with severe headache, vision changes and vomiting. I recommend that the patient come in for admission she likely needs a diagnostic spinal tap to relieve her elevated intracranial pressure. She voiced understanding and was willing to [facility]. I will speak to neuro hospitalist on call and arrange for admission and spinal tap. I explained to the mother that Dr. Lucia GaskinsAhern will not be the admitting doctor but may come by and see the patient socially

## 2016-07-11 NOTE — ED Notes (Signed)
In and out cath kit provided for patient to self cath with okay from physician.

## 2016-07-11 NOTE — ED Provider Notes (Signed)
[facility]-EMERGENCY DEPT Provider Note   CSN: [id number] Arrival date & time: 07/11/16  1806     History   Chief Complaint Chief Complaint  Patient presents with  . Headache    pt has HX of increased intracranial pressure-told to come here by [facility]rolog[facility]    HPI Donna Shannon is a 31 y.o. female.  Patient referred in by [facility] neurology. They're concerned that she has increased intracranial pressure and was requiring spinal tap and removal of fluid. Notes in Epic show that there was telephone conversation with him today and tent was for patient to be referred in for ED evaluation and neurology consultation.  Patient was admitted in October for similar concerns that abdomen going on for the past week. Patient with bilateral temporal throbbing headaches. Associated with the increased blurred vision and photophobia. Patient's not able to control the headaches. Patient also with increased body edema.  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 First one failed. Current 1 is working and was placed proximal in 9 years ago. Patient now followed by [facility] neurology.  Patient is from [place]. No local primary care doctor.  Patient states that over the past week the headaches have gotten worse in nature the headaches are similar just worse. Patients also been having increased swelling.      Past Medical History:  Diagnosis Date  . Anxiety   . Arthritis    "left knee" (04/24/2016)  . Chronic lower back pain   . Depression   . Essential hypertension   . Factor V Leiden (HCC)    [name] 04/24/2016  . Frequent UTI    [name] 04/24/2016  . Gout   . High cholesterol   . Migraine    "weekly" (04/24/2016)  . Neurogenic bladder    augmented bladder, I/O self caths  . On home oxygen therapy    "2L; every night" (04/24/2016)  . Ovarian cyst dx'd 04/22/2016   left  . Pneumonia 03/2016   [name] 04/24/2016  . Renal transplant failure  and rejection    age 38-11  . 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 Self-catheterizes urinary bladder    "~ 12 X/day" (04/24/2016)  . Stroke [facility]) 1991   denies residual on 04/24/2016    Patient Active Problem List   Diagnosis Date Noted  . Headache 07/11/2016  . Intractable headache 07/11/2016  . 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 Hypoxemia 06/25/2016  . Intractable chronic migraine without aura and with status migrainosus 06/25/2016  . IIH (idiopathic intracranial hypertension)   . HA (headache)   . Chronic renal disease, stage III   . Chronic diastolic heart failure (HCC)   . OSA (obstructive sleep apnea)   . Obesity 04/25/2016  . Renal transplant recipient 04/25/2016  . Depression 04/25/2016  . Neurogenic bladder 04/25/2016  . Essential hypertension 04/25/2016  . CKD (chronic kidney disease) stage 2, GFR 60-89 ml/min 04/25/2016  . Volume overload 04/24/2016    Past Surgical History:  Procedure Laterality Date  . ABDOMINAL HERNIA REPAIR  "several; when I was little"  . allograph biopsy  2013   [name] 04/24/2016  . HERNIA REPAIR    . KIDNEY TRANSPLANT  1996; 1998   father was donor; mother was donor/notes 04/24/2016  . PACEMAKER INSERTION  "?early 2000s"   for bladder function  . PARTIAL NEPHRECTOMY  1990s X 5   "removing disease"  . PORTACATH PLACEMENT Right 11/2015  .  RIGHT OOPHORECTOMY Right 2001   ovarian torsion    OB History    No data available       Home Medications    Prior to Admission medications   Medication Sig Start Date End Date Taking? Authorizing Provider  acetaZOLAMIDE (DIAMOX) 250 MG tablet Take 2 tablets (500 mg total) by mouth 2 (two) times daily. 06/21/16  Yes Anson FretAntonia B Ahern, MD  albuterol (PROVENTIL HFA;VENTOLIN HFA) 108 (90 Base) MCG/ACT inhaler Inhale 2 puffs into the lungs every 6 (six) hours as needed for wheezing or shortness of breath.   Yes Historical Provider, MD    allopurinol (ZYLOPRIM) 100 MG tablet Take 100 mg by mouth 2 (two) times daily.   Yes Historical Provider, MD  aspirin 81 MG chewable tablet Chew 81 mg by mouth daily.   Yes Historical Provider, MD  butalbital-acetaminophen-caffeine (FIORICET, ESGIC) 50-325-40 MG tablet Take 1 tablet by mouth 2 (two) times daily as needed for headache.   Yes Historical Provider, MD  Cholecalciferol (VITAMIN D-3) 1000 units CAPS Take 1 capsule by mouth daily.   Yes Historical Provider, MD  clonazePAM (KLONOPIN) 0.5 MG tablet Take 0.5 mg by mouth See admin instructions. Take 2 tablets in the morning, then take one in the afternoon, then take 2 tablets at night per patient   Yes Historical Provider, MD  cycloSPORINE modified (NEORAL) 100 MG capsule Take 100 mg by mouth 2 (two) times daily. Patient takes along with the 25mg  neoral tablet to make full dose of 125mg  by mouth twice daily per patient   Yes Historical Provider, MD  cycloSPORINE modified (NEORAL) 25 MG capsule Take 25 mg by mouth 2 (two) times daily. Take along with the 100mg  tablet to make a full dose of 125mg  tablet by mouth daily per patient   Yes Historical Provider, MD  fenofibrate 160 MG tablet Take 160 mg by mouth daily.   Yes Historical Provider, MD  flavoxATE (URISPAS) 100 MG tablet Take 100 mg by mouth 3 (three) times daily. Take every day per patient   Yes Historical Provider, MD  furosemide (LASIX) 40 MG tablet Take 40 mg by mouth daily.   Yes Historical Provider, MD  gabapentin (NEURONTIN) 300 MG capsule Take 300 mg by mouth 2 (two) times daily.    Yes Historical Provider, MD  gemfibrozil (LOPID) 600 MG tablet Take 600 mg by mouth daily.    Yes Historical Provider, MD  Icosapent Ethyl (VASCEPA) 1 g CAPS Take 1 capsule by mouth 2 (two) times daily.   Yes Historical Provider, MD  levETIRAcetam (KEPPRA) 500 MG tablet Take 500 mg by mouth 2 (two) times daily.   Yes Historical Provider, MD  magnesium oxide (MAG-OX) 400 MG tablet Take 800 mg by mouth 2  (two) times daily.   Yes Historical Provider, MD  methylphenidate (RITALIN) 10 MG tablet Take 10 mg by mouth 2 (two) times daily.   Yes Historical Provider, MD  metoprolol (LOPRESSOR) 50 MG tablet Take 1 tablet (50 mg total) by mouth 2 (two) times daily. 04/30/16  Yes Vassie Lollarlos Madera, MD  norethindrone (MICRONOR,CAMILA,ERRIN) 0.35 MG tablet Take 1 tablet by mouth daily.   Yes Historical Provider, MD  omega-3 acid ethyl esters (LOVAZA) 1 g capsule Take 1 g by mouth 3 (three) times daily.   Yes Historical Provider, MD  oxybutynin (DITROPAN) 5 MG tablet Take 1 tablet (5 mg total) by mouth every 8 (eight) hours as needed for bladder spasms. 04/30/16  Yes Vassie Lollarlos Madera, MD  pravastatin (PRAVACHOL)  40 MG tablet Take 40 mg by mouth at bedtime.   Yes Historical Provider, MD  predniSONE (DELTASONE) 2.5 MG tablet Take 2.5 mg by mouth daily with breakfast.   Yes Historical Provider, MD  prochlorperazine (COMPAZINE) 10 MG tablet Take 1 tablet (10 mg total) by mouth every 6 (six) hours as needed for nausea or vomiting. 06/25/16  Yes Anson Fret, MD  promethazine (PHENERGAN) 25 MG suppository Place 1 suppository (25 mg total) rectally every 6 (six) hours as needed for nausea or vomiting. 06/25/16  Yes Anson Fret, MD  ranitidine (ZANTAC) 150 MG tablet Take 150 mg by mouth every evening.   Yes Historical Provider, MD  rizatriptan (MAXALT) 10 MG tablet Take 1 tablet (10 mg total) by mouth daily as needed for migraine. May repeat in 2 hours if needed 06/25/16  Yes Anson Fret, MD  sertraline (ZOLOFT) 100 MG tablet Take 100 mg by mouth 2 (two) times daily.   Yes Historical Provider, MD  Teriparatide, Recombinant, (FORTEO) 600 MCG/2.4ML SOLN Inject 20 mcg into the skin daily. Take every day per patient   Yes Historical Provider, MD  traMADol (ULTRAM) 50 MG tablet Take 50 mg by mouth every 6 (six) hours as needed for moderate pain.   Yes Historical Provider, MD  zolpidem (AMBIEN) 10 MG tablet Take 10 mg by  mouth at bedtime as needed for sleep.   Yes Historical Provider, MD    Family History Family History  Problem Relation Age of Onset  . CAD Father     s/p CABG    Social History Social History  Substance Use Topics  . Smoking status: Former Smoker    Packs/day: 0.50    Years: 4.00    Types: Cigarettes    Quit date: 07/09/2014  . Smokeless tobacco: Never Used  . Alcohol use Yes     Comment: 04/24/2016 "might have 2 drinks/month, if that"     Allergies   Sulfa antibiotics   Review of Systems Review of Systems  Constitutional: Positive for fatigue. Negative for fever.  HENT: Negative for congestion.   Eyes: Positive for photophobia.  Respiratory: Negative for shortness of breath.   Cardiovascular: Positive for leg swelling. Negative for chest pain.  Gastrointestinal: Positive for nausea and vomiting. Negative for abdominal pain.  Musculoskeletal: Positive for back pain.  Skin: Negative for rash.  Allergic/Immunologic: Positive for immunocompromised state.  Neurological: Positive for headaches. Negative for speech difficulty.  Hematological: Does not bruise/bleed easily.  Psychiatric/Behavioral: Positive for confusion.     Physical Exam Updated Vital Signs BP 128/68 (BP Location: Right Arm)   Pulse (!) 121   Temp 98.2 F (36.8 C) (Oral)   Resp 20   Ht 5\' 2"  (1.575 m)   Wt 91.4 kg   LMP 06/20/2016   SpO2 97%   BMI 36.87 kg/m   Physical Exam  Constitutional: She is oriented to person, place, and time. She appears well-developed and well-nourished. No distress.  HENT:  Head: Normocephalic and atraumatic.  Mouth/Throat: Oropharynx is clear and moist.  Eyes: EOM are normal. Pupils are equal, round, and reactive to light.  Conjunctiva erythematous  Neck: Neck supple.  Marked swelling to the neck consistent with edema.  Cardiovascular: Normal rate, regular rhythm and normal heart sounds.   Pulmonary/Chest: Effort normal and breath sounds normal. No stridor. No  respiratory distress. She exhibits no tenderness.  Port-A-Cath right upper chest anteriorly  Abdominal: Soft. Bowel sounds are normal. There is no tenderness.  Musculoskeletal:  She exhibits edema.  Whole body edema.  Neurological: She is alert and oriented to person, place, and time. No cranial nerve deficit or sensory deficit. She exhibits normal muscle tone. Coordination normal.  Skin: No rash noted.  Nursing note and vitals reviewed.    ED Treatments / Results  Labs (all labs ordered are listed, but only abnormal results are displayed) Labs Reviewed  CBC WITH DIFFERENTIAL/PLATELET - Abnormal; Notable for the following:       Result Value   RBC 3.80 (*)    Hemoglobin 11.5 (*)    All other components within normal limits  COMPREHENSIVE METABOLIC PANEL - Abnormal; Notable for the following:    Potassium 3.4 (*)    Glucose, Bld 117 (*)    BUN 39 (*)    Creatinine, Ser 2.28 (*)    Alkaline Phosphatase 36 (*)    GFR calc non Af Amer 27 (*)    GFR calc Af Amer 32 (*)    All other components within normal limits  I-STAT CHEM 8, ED - Abnormal; Notable for the following:    BUN 41 (*)    Creatinine, Ser 2.40 (*)    Glucose, Bld 120 (*)    Hemoglobin 11.6 (*)    HCT 34.0 (*)    All other components within normal limits  BRAIN NATRIURETIC PEPTIDE  BASIC METABOLIC PANEL  MAGNESIUM    EKG  EKG Interpretation  Date/Time:  Wednesday July 11 2016 19:45:24 EST Ventricular Rate:  112 PR Interval:    QRS Duration: 85 QT Interval:  335 QTC Calculation: 458 R Axis:   88 Text Interpretation:  Sinus tachycardia Baseline wander in lead(s) V2 No previous ECGs available Confirmed by Maddoxx Burkitt  MD, Josep Luviano [phone]) on 07/11/2016 8:21:03 PM       Radiology Ct Head Wo Contrast  Result Date: 07/11/2016 CLINICAL DATA:  Headache for 3 months. EXAM: CT HEAD WITHOUT CONTRAST TECHNIQUE: Contiguous axial images were obtained from the base of the skull through the vertex without intravenous  contrast. COMPARISON:  04/29/2016 FINDINGS: Brain: There is no intracranial hemorrhage, mass or evidence of acute infarction. There is no extra-axial fluid collection. Gray matter and white matter appear normal. Cerebral volume is normal for age. Brainstem and posterior fossa are unremarkable. The CSF spaces appear normal. Vascular: No hyperdense vessel or unexpected calcification. Skull: Normal. Negative for fracture or focal lesion. Sinuses/Orbits: No acute finding. Other: None. IMPRESSION: Normal brain Electronically Signed   By: Ellery Plunk M.D.   On: 07/11/2016 21:06   Dg Chest Port 1 View  Result Date: 07/11/2016 CLINICAL DATA:  Edema and migraine headache.  Renal failure. EXAM: PORTABLE CHEST 1 VIEW COMPARISON:  Two-view chest x-ray 04/28/2016 FINDINGS: A right IJ double-lumen Port-A-Cath extends into the right atrium. Heart is enlarged. Mild edema is present. No definite lesions are present. Mild bibasilar airspace disease likely reflects atelectasis. IMPRESSION: 1. Cardiomegaly with mild interstitial edema. 2. No focal airspace disease. 3. Stable right IJ Port-A-Cath. Electronically Signed   By: Marin Roberts M.D.   On: 07/11/2016 20:15    Procedures Procedures (including critical care time)  Medications Ordered in ED Medications  sodium chloride flush (NS) 0.9 % injection 10-40 mL (30 mLs Intracatheter Given 07/11/16 2039)  acetaZOLAMIDE (DIAMOX) tablet 500 mg (not administered)  albuterol (PROVENTIL) (2.5 MG/3ML) 0.083% nebulizer solution 3 mL (not administered)  allopurinol (ZYLOPRIM) tablet 100 mg (not administered)  aspirin chewable tablet 81 mg (not administered)  cholecalciferol (VITAMIN D) tablet 1,000 Units (not  administered)  clonazePAM (KLONOPIN) tablet 0.5-1 mg (not administered)  flavoxATE (URISPAS) tablet 100 mg (not administered)  gabapentin (NEURONTIN) capsule 300 mg (not administered)  gemfibrozil (LOPID) tablet 600 mg (not administered)  Icosapent Ethyl CAPS  1 capsule (not administered)  levETIRAcetam (KEPPRA) tablet 500 mg (not administered)  magnesium oxide (MAG-OX) tablet 800 mg (not administered)  metoprolol tartrate (LOPRESSOR) tablet 50 mg (not administered)  norethindrone (MICRONOR,CAMILA,ERRIN) 0.35 MG tablet 0.35 mg (not administered)  omega-3 acid ethyl esters (LOVAZA) capsule 1 g (not administered)  oxybutynin (DITROPAN) tablet 5 mg (not administered)  pravastatin (PRAVACHOL) tablet 40 mg (not administered)  predniSONE (DELTASONE) tablet 2.5 mg (not administered)  famotidine (PEPCID) IVPB 20 mg premix (not administered)  sertraline (ZOLOFT) tablet 100 mg (not administered)  Teriparatide (Recombinant) SOLN 20 mcg (not administered)  traMADol (ULTRAM) tablet 50 mg (not administered)  zolpidem (AMBIEN) tablet 5 mg (not administered)  sodium chloride flush (NS) 0.9 % injection 3 mL (not administered)  sodium chloride flush (NS) 0.9 % injection 3 mL (not administered)  0.9 %  sodium chloride infusion (not administered)  acetaminophen (TYLENOL) tablet 650 mg (not administered)  ondansetron (ZOFRAN) injection 4 mg (not administered)  heparin injection 5,000 Units (not administered)  furosemide (LASIX) injection 40 mg (not administered)  potassium chloride SA (K-DUR,KLOR-CON) CR tablet 40 mEq (not administered)  furosemide (LASIX) tablet 40 mg (not administered)  HYDROmorphone (DILAUDID) tablet 2 mg (not administered)  cycloSPORINE modified (NEORAL) capsule 125 mg (not administered)  ondansetron (ZOFRAN) injection 4 mg (4 mg Intravenous Given 07/11/16 2043)  HYDROmorphone (DILAUDID) injection 1 mg (1 mg Intravenous Given 07/11/16 2043)     Initial Impression / Assessment and Plan / ED Course  I have reviewed the triage vital signs and the nursing notes.  Pertinent labs & imaging results that were available during my care of the patient were reviewed by me and considered in my medical decision making (see chart for details).  Clinical  Course    Records reviewed from last hospitalization in October as well as neurology records from [facility] neurology. Discussed with on-call nephrology they will see her in consultation tomorrow. Discussed with neuro hospitalists they will see her this evening.  Neuro hospitalists not recommending acute LP tap. Will be admitted by the triad hospitalist.  Patient symptoms could be related to the increased body edema. Patient's creatinine is in a range similar and higher than usual similar to when she was admitted in October. This may cause of some swelling in the brain and headaches. Also in addition neurology thought that perhaps due to her immunosuppressive therapy due to the kidney transplant that she may have developed pseudotumor cerebri.  Labs and head CT here without any acute findings. Patient markedly improved with IV hydromorphone and Zofran.   Final Clinical Impressions(s) / ED Diagnoses   Final diagnoses:  Generalized edema  Chronic intractable headache, unspecified headache type    New Prescriptions Current Discharge Medication List       Vanetta Mulders, MD 07/12/16 0005

## 2016-07-11 NOTE — Telephone Encounter (Signed)
Thank you Dr. Marjory LiesPenumalli. I spoke to Dr. Caryl PinaEric Lindzen tonight about Ms Donna Shannon and he is there seeing her now. Much appreciate you taking this call.

## 2016-07-11 NOTE — ED Notes (Signed)
Family left for the night.  Instructions given to call him for any issues no matter what time of day.  Judy PimpleMike Decker [phone].

## 2016-07-11 NOTE — ED Triage Notes (Addendum)
Presents here due to swelling all over-pt is transplant kidney patient-she reports that she has increased intracranial pressure, waking up every morning with vomitng and headaches for one month-sent here by neurologist. HX of same requiring spinal tap and drainage of spinal fluid. Pt is tachycardiac-swollen all over, facial swelling, abdominal swelling- slight nystagmus noted when looking right-shuffling feet when walking, increased weakness. HR 125, BP 125/90

## 2016-07-11 NOTE — Telephone Encounter (Signed)
Called and spoke with mother. Relayed per Dr Pearlean BrownieSethi for them to proceed to ER at [facility]. The ER MD will evaluate pt and see if they need to do another spinal tap. They will contact neuroradiologist if needed. They may need to admit her for further testing. Dr Pearlean BrownieSethi is going to call ED and speak with charge nurse in ER also to explain situation. She verbalized understanding and stated pt is sleeping. Her mother has pneumonia right now and being moved to nursing home facility. She has to work this out and then will be taking Donna Shannon to the hospital.   Advised if they end up wanting her to f/u in the hospital we can see her Friday at 12pm, check in 1130pm. She verbalized understanding.

## 2016-07-11 NOTE — ED Notes (Signed)
Neurology at the bedside

## 2016-07-12 DIAGNOSIS — G43711 Chronic migraine without aura, intractable, with status migrainosus: Secondary | ICD-10-CM

## 2016-07-12 DIAGNOSIS — I5033 Acute on chronic diastolic (congestive) heart failure: Secondary | ICD-10-CM

## 2016-07-12 DIAGNOSIS — I5032 Chronic diastolic (congestive) heart failure: Secondary | ICD-10-CM

## 2016-07-12 DIAGNOSIS — G8929 Other chronic pain: Secondary | ICD-10-CM

## 2016-07-12 DIAGNOSIS — R51 Headache: Secondary | ICD-10-CM

## 2016-07-12 LAB — BLOOD GAS, ARTERIAL
ACID-BASE EXCESS: 1.7 mmol/L (ref 0.0–2.0)
Acid-Base Excess: 1.2 mmol/L (ref 0.0–2.0)
BICARBONATE: 27.8 mmol/L (ref 20.0–28.0)
Bicarbonate: 28.3 mmol/L — ABNORMAL HIGH (ref 20.0–28.0)
DRAWN BY: 441371
Delivery systems: POSITIVE
Drawn by: 301361
EXPIRATORY PAP: 6
FIO2: 32
FIO2: 40
Inspiratory PAP: 12
MODE: POSITIVE
O2 Saturation: 95.9 %
O2 Saturation: 98.5 %
PH ART: 7.251 — AB (ref 7.350–7.450)
PO2 ART: 81.2 mmHg — AB (ref 83.0–108.0)
PO2 ART: 136 mmHg — AB (ref 83.0–108.0)
Patient temperature: 98.2
Patient temperature: 98.6
RATE: 12 resp/min
pCO2 arterial: 65.3 mmHg (ref 32.0–48.0)
pCO2 arterial: 66.7 mmHg (ref 32.0–48.0)
pH, Arterial: 7.25 — ABNORMAL LOW (ref 7.350–7.450)

## 2016-07-12 LAB — BASIC METABOLIC PANEL
Anion gap: 10 (ref 5–15)
BUN: 36 mg/dL — ABNORMAL HIGH (ref 6–20)
CO2: 28 mmol/L (ref 22–32)
Calcium: 9.4 mg/dL (ref 8.9–10.3)
Chloride: 102 mmol/L (ref 101–111)
Creatinine, Ser: 2.27 mg/dL — ABNORMAL HIGH (ref 0.44–1.00)
GFR calc Af Amer: 32 mL/min — ABNORMAL LOW (ref >60)
GFR calc non Af Amer: 27 mL/min — ABNORMAL LOW (ref >60)
Glucose, Bld: 111 mg/dL — ABNORMAL HIGH (ref 65–99)
Potassium: 4.5 mmol/L (ref 3.5–5.1)
Sodium: 140 mmol/L (ref 135–145)

## 2016-07-12 LAB — GLUCOSE, CAPILLARY: Glucose-Capillary: 102 mg/dL — ABNORMAL HIGH (ref 65–99)

## 2016-07-12 LAB — MAGNESIUM: Magnesium: 2.6 mg/dL — ABNORMAL HIGH (ref 1.7–2.4)

## 2016-07-12 LAB — MRSA PCR SCREENING: MRSA BY PCR: NEGATIVE

## 2016-07-12 MED ORDER — DEXTROSE 5 % IV SOLN
1.0000 g | INTRAVENOUS | Status: DC
  Administered 2016-07-12 – 2016-07-15 (×4): 1 g via INTRAVENOUS
  Filled 2016-07-12 (×4): qty 10

## 2016-07-12 MED ORDER — ALTEPLASE 2 MG IJ SOLR
2.0000 mg | Freq: Once | INTRAMUSCULAR | Status: AC
Start: 2016-07-12 — End: 2016-07-12
  Administered 2016-07-12: 2 mg
  Filled 2016-07-12: qty 2

## 2016-07-12 MED ORDER — FUROSEMIDE 10 MG/ML IJ SOLN
40.0000 mg | Freq: Two times a day (BID) | INTRAMUSCULAR | Status: DC
  Administered 2016-07-12 – 2016-07-14 (×4): 40 mg via INTRAVENOUS
  Filled 2016-07-12 (×4): qty 4

## 2016-07-12 MED ORDER — CHLORHEXIDINE GLUCONATE 0.12 % MT SOLN
15.0000 mL | Freq: Two times a day (BID) | OROMUCOSAL | Status: DC
  Administered 2016-07-12 – 2016-07-15 (×5): 15 mL via OROMUCOSAL
  Filled 2016-07-12 (×5): qty 15

## 2016-07-12 MED ORDER — NALOXONE HCL 0.4 MG/ML IJ SOLN
INTRAMUSCULAR | Status: AC
  Administered 2016-07-12: 0.4 mg via INTRAVENOUS
  Filled 2016-07-12: qty 1

## 2016-07-12 MED ORDER — CLONAZEPAM 0.5 MG PO TABS
0.5000 mg | ORAL_TABLET | Freq: Every day | ORAL | Status: DC
  Administered 2016-07-12 – 2016-07-14 (×3): 0.5 mg via ORAL
  Filled 2016-07-12 (×3): qty 1

## 2016-07-12 MED ORDER — NALOXONE HCL 0.4 MG/ML IJ SOLN
0.4000 mg | Freq: Once | INTRAMUSCULAR | Status: AC
  Administered 2016-07-12 (×2): 0.4 mg via INTRAVENOUS

## 2016-07-12 MED ORDER — FLUMAZENIL 0.5 MG/5ML IV SOLN
0.2000 mg | Freq: Once | INTRAVENOUS | Status: AC
  Administered 2016-07-12: 0.2 mg via INTRAVENOUS

## 2016-07-12 MED ORDER — NALOXONE HCL 0.4 MG/ML IJ SOLN
INTRAMUSCULAR | Status: AC
  Filled 2016-07-12: qty 1

## 2016-07-12 MED ORDER — MYCOPHENOLATE MOFETIL 250 MG PO CAPS
500.0000 mg | ORAL_CAPSULE | Freq: Two times a day (BID) | ORAL | Status: DC
Start: 2016-07-12 — End: 2016-07-15
  Administered 2016-07-12 – 2016-07-15 (×7): 500 mg via ORAL
  Filled 2016-07-12 (×7): qty 2

## 2016-07-12 MED ORDER — ORAL CARE MOUTH RINSE
15.0000 mL | Freq: Two times a day (BID) | OROMUCOSAL | Status: DC
  Administered 2016-07-12 – 2016-07-14 (×3): 15 mL via OROMUCOSAL

## 2016-07-12 MED ORDER — NALOXONE HCL 0.4 MG/ML IJ SOLN: 0.4000 mg | INTRAMUSCULAR | Status: DC | PRN

## 2016-07-12 MED ORDER — FLUMAZENIL 0.5 MG/5ML IV SOLN
INTRAVENOUS | Status: AC
  Filled 2016-07-12: qty 5

## 2016-07-12 NOTE — Consult Note (Signed)
NEURO HOSPITALIST CONSULT NOTE   Requestig physician: Dr. Antionette Char  Reason for Consult: Severe headache  History obtained from:  Patient, Family and Chart     HPI:                                                                                                                                          Donna Shannon is an 31 y.o. female who presents with re-occurrence of facial edema and severe intractable headache. 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 She currently has CKD3 involving her transplanted kidney. Her PMHx also includes obesity, depression, HTN, anxiety, chronic back pain, neurogenic bladder, chronic migraine and pseudotumor cerebri. Her neurogenic bladder is managed with a bladder pacemaker and in-out self catheterizations. She is on chronic immunosupression for her renal transplant with cyclophospamide and prednisone. Of note, she first began experiencing severe headaches in mid-2015, about one year after starting cyclophosphamide in 2014 when her anti-rejection regimen was changed. She states that she had no history of significant headaches prior to this; her new-onset headaches were so severe that she was evaluated and diagnosed with migraines. Subsequently, she was diagnosed with pseudotumor cerebri after an LP revealed elevated opening pressure.   In September to October of 2017, she experienced progressive diffuse edema, worse in the face/neck region, in conjunction with progressively worsened headache and confusion. She was admitted for this and diagnosed with volume overload. At that time she was noted to have experienced a 40 lb weight gain from 160 to 200 over the August to October time period. She was so fluid overloaded at that time that she was experiencing dyspnea and had to use  O2 at night, progressing to air hunger during the day. She was admitted and treated for her volume overload with aggressive diuresis. Of note, she was also noted to have chronic diastolic heart failure at that time. She had not responded to 20 or 60 mg po Lasix, so was switched to IV Lasix at 80 mg BID x 4 doses. Of note, her creatinine increased but at discharge had decreased to 2.1. At discharge, Nephrology had recommended Lasix 40 mg daily. Echocardiogram at that time revealed grade 2 diastolic HF and preserved EF; no wall motion abnormalities. During her prior admission, her headache was also addressed and diagnostic lumbar puncture was performed with reported opening pressure of 27, decreasing to 13 after removal of 20 cc CSF. She experienced relief from her headaches over a short period of time afterwards. She was diagnosed with possible pseudotumor cerebri and started on Diamox 250 mg BID at that time. It was felt that her  obesity had been playing a role regarding her headaches, given possible pseudotumor cerebri; her worsened weight gain being felt not only to be due to volume overload, but to medication side effects of Depo-Provera, Lyrica/gabapentin and corticosteroids. It was felt that treatment with exercise and diet were indicated. She was taken off Depo-Provera and started on Forteo for her osteoporosis. Respiratory compromise mostly was felt to be due to volume overload, but her OSA/OHS was also felt to be playing a role and it was felt that she would benefit from an outpatient sleep study. She has been using her father's CPAP device at home and had been scheduled for a sleep study later this week. It was felt by her outpatient Neurologist that her sleep apnea was a significant contributor to her headaches.    Since discharge, the symptoms leading to her October 2017 admission have gradually recurred. Chronic daily headaches as well as re-occurrence of diffuse swelling have been present for the past  month, with blurred vision as well as vomiting occurring in the AM. The swelling has not responded well to her current dosage regimen of diuretics and the headache has worsened despite increasing Diamox from 250 BID to 500 BID. Over the past week she has had significant worsening of her headache associated symptoms and diffuse swelling, described by the patient and her parents as being essentially identical to her last exacerbation. Progressive symptoms over the past week have consisted of diffuse swelling, particularly involving the head and neck region, confusion, worsened blurry vision, severe intractable headaches and nausea with vomiting. She also endorses pulsatile tinnitus in her right ear. Symptoms progressively worsened and she has made several calls to the Neurology clinic for her headache and confusion symptoms; it was felt that the severity of the headache in addition to diffuse swelling warranted admission and she was advised to be evaluated in the ED. Therapeutic LP was a consideration in addition to management of her swelling. On arrival, her Cr was noted to be elevated above her baseline, similar to the episode in October, consistent with worsening function of her renal transplant. She does not endorse weakness or sensory changes other than her worsened vision.   On arrival to the ED she was afebrile and saturating well on RA, but tachycardic in the 120s, with EKG demonstrating sinus tachy. Chest x-rays revealed cardiomegaly and mild interstitial edema. CT of head revealed no acute changes. BUN was elevated at 41 and serum Cr was 2.4, increased from her prior baseline.  Recent prior MRI was personally reviewed, revealing no findings suggestive of pseudotumor cerebri except for mild prominence of the CSF signal within the sella turcica dorsal to the pituitary gland. Of note, there was no widening of the perioptic CSF spaces or flattening of the globes at the locations of the optic nerve heads.     Past Medical History:  Diagnosis Date  . Anxiety   . Arthritis    "left knee" (04/24/2016)  . Chronic lower back pain   . Depression   . Essential hypertension   . Factor V Leiden (HCC)    [name]/notes 04/24/2016  . Frequent UTI    [name]/notes 04/24/2016  . Gout   . High cholesterol   . Migraine    "weekly" (04/24/2016)  . Neurogenic bladder    augmented bladder, I/O self caths  . On home oxygen therapy    "2L; every night" (04/24/2016)  . Ovarian cyst dx'd 04/22/2016   left  . Pneumonia 03/2016   [name]/notes 04/24/2016  .  Renal transplant failure and rejection    age 56-11  . 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 Self-catheterizes urinary bladder    "~ 12 X/day" (04/24/2016)  . Stroke [facility]) 1991   denies residual on 04/24/2016    Past Surgical History:  Procedure Laterality Date  . ABDOMINAL HERNIA REPAIR  "several; when I was little"  . allograph biopsy  2013   [name] 04/24/2016  . HERNIA REPAIR    . KIDNEY TRANSPLANT  1996; 1998   father was donor; mother was donor/notes 04/24/2016  . PACEMAKER INSERTION  "?early 2000s"   for bladder function  . PARTIAL NEPHRECTOMY  1990s X 5   "removing disease"  . PORTACATH PLACEMENT Right 11/2015  . RIGHT OOPHORECTOMY Right 2001   ovarian torsion    Family History  Problem Relation Age of Onset  . CAD Father     s/p CABG   Social History:  reports that she quit smoking about 2 years ago. Her smoking use included Cigarettes. She has a 2.00 pack-year smoking history. She has never used smokeless tobacco. She reports that she drinks alcohol. She reports that she does not use drugs.  Allergies  Allergen Reactions  . Sulfa Antibiotics Itching    MEDICATIONS:                                                                                                                     Prior to Admission:  Prescriptions Prior to Admission  Medication Sig Dispense Refill Last Dose  .  acetaZOLAMIDE (DIAMOX) 250 MG tablet Take 2 tablets (500 mg total) by mouth 2 (two) times daily. 120 tablet 11 07/11/2016 at Unknown time  . albuterol (PROVENTIL HFA;VENTOLIN HFA) 108 (90 Base) MCG/ACT inhaler Inhale 2 puffs into the lungs every 6 (six) hours as needed for wheezing or shortness of breath.   Past Month at Unknown time  . allopurinol (ZYLOPRIM) 100 MG tablet Take 100 mg by mouth 2 (two) times daily.   07/11/2016 at Unknown time  . aspirin 81 MG chewable tablet Chew 81 mg by mouth daily.   07/11/2016 at Unknown time  . butalbital-acetaminophen-caffeine (FIORICET, ESGIC) 50-325-40 MG tablet Take 1 tablet by mouth 2 (two) times daily as needed for headache.   Past Month at Unknown time  . Cholecalciferol (VITAMIN D-3) 1000 units CAPS Take 1 capsule by mouth daily.   07/11/2016 at Unknown time  . clonazePAM (KLONOPIN) 0.5 MG tablet Take 0.5 mg by mouth See admin instructions. Take 2 tablets in the morning, then take one in the afternoon, then take 2 tablets at night per patient   07/11/2016 at Unknown time  . cycloSPORINE modified (NEORAL) 100 MG capsule Take 100 mg by mouth 2 (two) times daily. Patient takes along with the 25mg  neoral tablet to make full dose of 125mg  by mouth twice daily per patient   07/11/2016 at Unknown time  . cycloSPORINE modified (NEORAL) 25 MG capsule  Take 25 mg by mouth 2 (two) times daily. Take along with the 100mg  tablet to make a full dose of 125mg  tablet by mouth daily per patient   07/11/2016 at Unknown time  . fenofibrate 160 MG tablet Take 160 mg by mouth daily.   07/11/2016 at Unknown time  . flavoxATE (URISPAS) 100 MG tablet Take 100 mg by mouth 3 (three) times daily. Take every day per patient   07/11/2016 at Unknown time  . furosemide (LASIX) 40 MG tablet Take 40 mg by mouth daily.   07/11/2016 at Unknown time  . gabapentin (NEURONTIN) 300 MG capsule Take 300 mg by mouth 2 (two) times daily.    07/11/2016 at Unknown time  . gemfibrozil (LOPID) 600 MG tablet Take 600 mg by  mouth daily.    07/11/2016 at Unknown time  . Icosapent Ethyl (VASCEPA) 1 g CAPS Take 1 capsule by mouth 2 (two) times daily.   07/11/2016 at Unknown time  . levETIRAcetam (KEPPRA) 500 MG tablet Take 500 mg by mouth 2 (two) times daily.   07/11/2016 at Unknown time  . magnesium oxide (MAG-OX) 400 MG tablet Take 800 mg by mouth 2 (two) times daily.   07/11/2016 at Unknown time  . methylphenidate (RITALIN) 10 MG tablet Take 10 mg by mouth 2 (two) times daily.   07/11/2016 at Unknown time  . metoprolol (LOPRESSOR) 50 MG tablet Take 1 tablet (50 mg total) by mouth 2 (two) times daily. 60 tablet 1 07/10/2016 at 0800  . norethindrone (MICRONOR,CAMILA,ERRIN) 0.35 MG tablet Take 1 tablet by mouth daily.   07/11/2016 at Unknown time  . omega-3 acid ethyl esters (LOVAZA) 1 g capsule Take 1 g by mouth 3 (three) times daily.   07/11/2016 at Unknown time  . oxybutynin (DITROPAN) 5 MG tablet Take 1 tablet (5 mg total) by mouth every 8 (eight) hours as needed for bladder spasms. 20 tablet 0 Past Month at Unknown time  . pravastatin (PRAVACHOL) 40 MG tablet Take 40 mg by mouth at bedtime.   07/10/2016 at Unknown time  . predniSONE (DELTASONE) 2.5 MG tablet Take 2.5 mg by mouth daily with breakfast.   07/11/2016 at Unknown time  . prochlorperazine (COMPAZINE) 10 MG tablet Take 1 tablet (10 mg total) by mouth every 6 (six) hours as needed for nausea or vomiting. 30 tablet 6 07/10/2016 at Unknown time  . promethazine (PHENERGAN) 25 MG suppository Place 1 suppository (25 mg total) rectally every 6 (six) hours as needed for nausea or vomiting. 12 each 6 07/11/2016 at Unknown time  . ranitidine (ZANTAC) 150 MG tablet Take 150 mg by mouth every evening.   07/10/2016 at Unknown time  . rizatriptan (MAXALT) 10 MG tablet Take 1 tablet (10 mg total) by mouth daily as needed for migraine. May repeat in 2 hours if needed 10 tablet 6 07/11/2016 at Unknown time  . sertraline (ZOLOFT) 100 MG tablet Take 100 mg by mouth 2 (two) times daily.   07/11/2016 at  Unknown time  . Teriparatide, Recombinant, (FORTEO) 600 MCG/2.4ML SOLN Inject 20 mcg into the skin daily. Take every day per patient   07/11/2016 at Unknown time  . traMADol (ULTRAM) 50 MG tablet Take 50 mg by mouth every 6 (six) hours as needed for moderate pain.   07/11/2016 at Unknown time  . zolpidem (AMBIEN) 10 MG tablet Take 10 mg by mouth at bedtime as needed for sleep.   07/10/2016 at Unknown time    ROS:  History obtained from patient. Does not endorse chest pain, abdominal/flank pain or limb pain. Also without fevers/chills or productive cough. She has had some SOB with exertion.    Blood pressure 121/80, pulse (!) 117, temperature 98.6 F (37 C), temperature source Axillary, resp. rate 18, height 5\' 2"  (1.575 m), weight 91.4 kg (201 lb 9.6 oz), last menstrual period 06/20/2016, SpO2 96 %.  General Examination:                                                                                                      HEENT-  Face and neck region are diffusely edematous.   Abdomen: Round, obese with chronic scarring and eschar in the region of pubis.  Extremities- 1+ pretibial pitting edema. No cyanosis or pallor.   Neurological Examination Mental Status: Awake and mildly confused relative to her baseline. Oriented to self, day, year and situation. Has difficulty recalling commonly known facts. Mildly increased latency of verbal responses. Mild increased latency when performing calculations. Appears fatigued and anxious. No receptive or expressive aphasia noted.  Cranial Nerves: II: Unable to visualize fundi due to small pupils. Has severe constriction of visual fields bilaterally in temporal and nasal quadrants. Decreased visual acuity bilaterally with difficulty reading large print.  III,IV, VI: ptosis not present, extra-ocular motions intact bilaterally without  nystagmus V,VII: face is symmetric, facial temperature sensation normal bilaterally VIII: hearing intact to conversation IX,X: Unable to visualize uvula and posterior margins of palate XI: symmetric XII: midline tongue extension Motor: Right : Upper extremity   5/5    Left:     Upper extremity   5/5  Lower extremity   5/5     Lower extremity   5/5 Sensory: Temperature and light touch intact in all 4 extremities, without extinction Deep Tendon Reflexes: 1+ brachioradialis and biceps reflexes. Unelicitable patellar and achilles  Cerebellar:  No ataxia with FNF bilaterally.  Gait: Deferred.    Lab Results: Basic Metabolic Panel:  Recent Labs Lab 07/11/16 1846 07/11/16 1913  NA 139 140  K 3.4* 3.5  CL 101 104  CO2 26  --   GLUCOSE 117* 120*  BUN 39* 41*  CREATININE 2.28* 2.40*  CALCIUM 9.5  --     Liver Function Tests:  Recent Labs Lab 07/11/16 1846  AST 24  ALT 16  ALKPHOS 36*  BILITOT 1.0  PROT 7.1  ALBUMIN 4.0   No results for input(s): LIPASE, AMYLASE in the last 168 hours. No results for input(s): AMMONIA in the last 168 hours.  CBC:  Recent Labs Lab 07/11/16 1846 07/11/16 1913  WBC 6.3  --   NEUTROABS 3.2  --   HGB 11.5* 11.6*  HCT 36.2 34.0*  MCV 95.3  --   PLT 196  --     Cardiac Enzymes: No results for input(s): CKTOTAL, CKMB, CKMBINDEX, TROPONINI in the last 168 hours.  Lipid Panel: No results for input(s): CHOL, TRIG, HDL, CHOLHDL, VLDL, LDLCALC in the last 168 hours.  CBG: No results for input(s): GLUCAP in the last 168 hours.  Microbiology: Results for orders  placed or performed during the hospital encounter of 04/24/16  CSF culture     Status: None   Collection Time: 04/29/16 12:16 PM  Result Value Ref Range Status   Specimen Description CSF  Final   Special Requests Immunocompromised  Final   Gram Stain NO WBC SEEN NO ORGANISMS SEEN CYTOSPIN SMEAR   Final   Culture NO GROWTH 3 DAYS  Final   Report Status 05/02/2016 FINAL   Final  Culture, blood (routine x 2)     Status: None   Collection Time: 04/29/16 12:32 PM  Result Value Ref Range Status   Specimen Description BLOOD BLOOD LEFT ARM  Final   Special Requests IN PEDIATRIC BOTTLE 2CC  Final   Culture NO GROWTH 5 DAYS  Final   Report Status 05/04/2016 FINAL  Final  Culture, blood (routine x 2)     Status: None   Collection Time: 04/29/16 12:49 PM  Result Value Ref Range Status   Specimen Description BLOOD BLOOD RIGHT ARM  Final   Special Requests BOTTLES DRAWN AEROBIC ONLY 5CC  Final   Culture NO GROWTH 5 DAYS  Final   Report Status 05/04/2016 FINAL  Final    Coagulation Studies: No results for input(s): LABPROT, INR in the last 72 hours.  Imaging: Ct Head Wo Contrast  Result Date: 07/11/2016 CLINICAL DATA:  Headache for 3 months. EXAM: CT HEAD WITHOUT CONTRAST TECHNIQUE: Contiguous axial images were obtained from the base of the skull through the vertex without intravenous contrast. COMPARISON:  04/29/2016 FINDINGS: Brain: There is no intracranial hemorrhage, mass or evidence of acute infarction. There is no extra-axial fluid collection. Gray matter and white matter appear normal. Cerebral volume is normal for age. Brainstem and posterior fossa are unremarkable. The CSF spaces appear normal. Vascular: No hyperdense vessel or unexpected calcification. Skull: Normal. Negative for fracture or focal lesion. Sinuses/Orbits: No acute finding. Other: None. IMPRESSION: Normal brain Electronically Signed   By: Ellery Plunk M.D.   On: 07/11/2016 21:06   Dg Chest Port 1 View  Result Date: 07/11/2016 CLINICAL DATA:  Edema and migraine headache.  Renal failure. EXAM: PORTABLE CHEST 1 VIEW COMPARISON:  Two-view chest x-ray 04/28/2016 FINDINGS: A right IJ double-lumen Port-A-Cath extends into the right atrium. Heart is enlarged. Mild edema is present. No definite lesions are present. Mild bibasilar airspace disease likely reflects atelectasis. IMPRESSION: 1.  Cardiomegaly with mild interstitial edema. 2. No focal airspace disease. 3. Stable right IJ Port-A-Cath. Electronically Signed   By: Marin Roberts M.D.   On: 07/11/2016 20:15   Assessment: 31 year old renal transplant patient with worsening renal function, volume overload and worsening headaches with previously documented elevated CSF opening pressure.  1. Worsened headaches likely secondary to elevated CSF pressure. Not a typical presentation for pseudotumor cerebri given other complicating factors. Of note, literature search reveals several case reports describing pseudotumor cerebri on patients on immunosuppression with cyclophosphamide; the cases have in common younger age and obesity. A case was reviewed describing resolution of pseudotumor within 12 weeks of switching cyclophosphamide to mycophenolate mofetil. It is felt that Ms. Stockley's headaches, having begun over a relatively short period after starting cyclophosphamide, may be secondary to this medication.  2. Other factors likely contributing to her headaches are her obesity and OSA, but these are not felt to be as likely as cyclophosphamide toxicity.  3. Dyspnea symptoms likely related to volume overload in context of prior diagnosis of diastolic CHF. Relative hypoxemia and/or hypercarbia could be contributing to  her headaches.   Recommendations: 1. A therapeutic LP will be a temporizing measure and will not address the underlying etiology for her elevated CSF pressures. Likely will be needed however, due to severe headache with worsening vision. Would obtain with radiological guidance given her history of low back pain, diffuse swelling and obesity. Would measure opening pressure in lateral decubitus position; fluoro guided LP often performed with patient's prone, which can generate artifactually elevated CSF pressure readings; after insertion of needle, will need to be turned on her side for measurement of opening pressure.  2.  Nephrology consultation for diuresis and diagnosis/management of worsening renal function.  3. Contact her transplant doctor regarding consensus recommendation between myself and Dr. Lucia Gaskins following telephone discussion, of switching cyclophosphamide to an alternate anti-rejection drug, given case reports documenting pseudotumor cerebri secondary to cyclophosphamide.  4. ABG. 5. CPAP at night. Will need respiratory evaluation. If possible, respiratory to determine optimal CPAP settings during this admission and prescribe CPAP machine for home use upon discharge.  6. Trial of IV phenergan and other PRN medications for headache pain. This is a temporizing measure and will not address the underlying etiology.  7. Neurology will continue to follow.   Electronically signed: Dr. Caryl Pina 07/12/2016, 5:08 AM

## 2016-07-12 NOTE — Progress Notes (Signed)
Patient difficult to arouse, falls asleep during conversation. Oriented x3, states she is at hospital because "my kidneys are out." States she is in no pain. CBG 102, bp 114/77, temp 99.0, pulse 93, SaO2 98% on nasal cannula. Dr. Susie CassetteAbrol notified. Narcan given at [phone].

## 2016-07-12 NOTE — Progress Notes (Signed)
Patient transferred to 3S with cell phone, bag, blankets. Report given to rn receiving patient.

## 2016-07-12 NOTE — Progress Notes (Signed)
Per MD, patient is NPO with sips for meds when patient is alert.

## 2016-07-12 NOTE — Progress Notes (Addendum)
Patient has a 6/10 HA and some intermittent blurred vision. No further complications. I have placed the order to LP under fluoroscopy and will place patient on SCD. Will follow.   [name] PA-C [facility] [phone]  M-F  (8:30 am- 4 PM)  07/12/2016, 4:06 PM   I suspect that Dr. Kathrene BongoGoldsborough may be right when she mentioned SVC syndrome, This can cause the facial swelling, elevated intracranial pressures, dyspnea. The time course fits. Given her elevated creatinine, not sure if we can do contrasted CT or MRI, will need to discuss with radiology best approach. If there is going to be any delay, then therapeutic LP may still be indicated, but if workup could be relatively quick then could hold off if the underlying etiology could be addressed.   Donna SlotMcNeill Ritisha Deitrick, MD [facility] [phone]  If 7pm- 7am, please page neurology on call as listed in AMION.

## 2016-07-12 NOTE — Significant Event (Signed)
Rapid Response Event Note  Overview:  Called by RN for abnormal ABG Time Called: 1037 Arrival Time: 1045 Event Type: Neurologic  Initial Focused Assessment:  Called by primary RN for abnormal ABG, 7.25, 65, 81, 98% on nasal cannula.  AS per RN patietn is very lethargic this am and hard to arouse.  On my arrival to patient bedside patietn is lethargic but easily aroused.  MD at bedside   Interventions:  Narcan given by RN x 2.  Bipap started by RT.  Patient tolerating BIPAP well.  Order for SDU placed.    Plan of Care (if not transferred):  RRT sitting with patient until bed available  Event Summary:  PAtient to transfer to SDU.  PAteint transferred to 3 s on bed, bipap, monitor and RRT present    at      at          [facility] Donna Amosenise Ann

## 2016-07-12 NOTE — Telephone Encounter (Signed)
Dr Ahern alreLucia Gaskinsady spoke with Dr. Caryl PinaEric Lindzen about pt/labs per AA,MD.

## 2016-07-12 NOTE — Progress Notes (Addendum)
[facility] PROGRESS NOTE  Donna Shannon UJW:[id number] DOB: 01-27-1986 DOA: 07/11/2016   PCP: No PCP Per Patient     Assessment/Plan: Principal Problem:   Intractable chronic migraine without aura and with status migrainosus Active Problems:   Renal transplant recipient   Depression   Essential hypertension   IIH (idiopathic intracranial hypertension)   HA (headache)   Chronic renal disease, stage III   Chronic diastolic heart failure (HCC)   OSA (obstructive sleep apnea)   Headache   Intractable headache   Chronic intractable headache   Acute on chronic diastolic heart failure (HCC)    31 y.o. female with medical history significant for obesity, hypertension, chronic migraine, idiopathic intracranial hypertension, depression, anxiety, neurogenic bladder, and chronic kidney disease stage III involving her transplant kidney, now presenting to the emergency department at the direction of her neurologist for evaluation of intractable headaches with blurred vision and nausea/vomiting.Neurologist was also consulted by the ED physician and has evaluated the patient in the emergency department. A medical admission is been advised for further evaluation and management of intractable headache with history of idiopathic intracranial hypertension.   Assessment and plan 1. Intractable headache  - Pt presents with her typical headache, but has been progressively worsening over the past month  - Head CT reveals normal brain and no focal neurologic deficits are appreciated on exam  - She has hx of idiopathic intracranial hypertension and reports temporary relief of these same sxs in October 2017 with LP  Neurology following, apparently has a diagnosis of pseudotumor cerebri after an LP revealed elevated opening pressure  During her prior admission, her headache was also addressed and diagnostic lumbar puncture was performed with reported opening pressure of 27, decreasing to 13 after  removal of 20 cc CSF.started on Diamox 250 mg BID at that time  2. Acute on chronic diastolic CHF  - Pt appears to have excess volume on admission  - Wt is stable, but there is peripheral edema, crackles, and interstitial edema noted on CXR  - TTE (04/28/16) with EF 60-65%, grade 2 diastolic dysfunction, mild LVH, and mild TR  - She is managed at home with Lasix 40 mg qD, metoprolol, and Diamox - She will be given a Lasix 40 mg IVP x1 on admission; plan to resume her usual PO Lasix  today - SLIV, fluid-restrict diet, follow daily wts and I/Os   3. CKD stage III; s/p renal transplantation - SCr is 2.28 on admission; baseline appears to be ~2  - She is on her second related living donor transplanted kidney from related living donor, this one placed in late 1990's  - Continue anti-rejection medications  - Nephrology is consulting and much appreciated; will follow-up on recommendations    4. Hypertension  - BP at goal on admission  - Continue current management with Lopressor and Lasix as tolerated   5. OSA - Continue CPAP qHS   6. Anxiety, insomnia , acute toxic encephalopathy On multiple sedating medications Patient received Phenergan for headache last night In addition she is on Klonopin, Ambien, Dilaudid Dose of Klonopin has been reduced Received 2 doses of narcan and one dose of flumazenil tx to stepdown on BiPAP due to elevated CO2, pH of 7.25  7. 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 Venous doppler to assess for evidence of UE DVT Invasive contrast venography is the most conclusive diagnostic tool , will discuss options with IR in am    DVT prophylaxsis heparin  Code Status:  Full code    Family Communication: Discussed in detail with the patient, all imaging results, lab results explained to the patient   Disposition Plan:   Transfer to stepdown      Consultants:  Nephrology  Neurology    Procedures:  None  Antibiotics: Anti-infectives    None          HPI/Subjective: Patient difficult to arouse, falls asleep during conversation. Oriented x3, states she is at hospital   Objective: Vitals:   07/11/16 2328 07/12/16 0220 07/12/16 0623 07/12/16 0930  BP: 128/68 121/80 111/80 114/77  Pulse: (!) 121 (!) 117 95 93  Resp: 20 18 16 15   Temp: 98.2 F (36.8 C) 98.6 F (37 C) 98.7 F (37.1 C) 99 F (37.2 C)  TempSrc: Oral Axillary Oral Oral  SpO2: 97% 96% 98% 98%  Weight: 91.4 kg (201 lb 9.6 oz)     Height: 5\' 2"  (1.575 m)       Intake/Output Summary (Last 24 hours) at 07/12/16 1013 Last data filed at 07/12/16 0516  Gross per 24 hour  Intake              280 ml  Output              600 ml  Net             -320 ml    Exam:  Examination:  General exam: Appears calm and comfortable  Respiratory system: Clear to auscultation. Respiratory effort normal. Cardiovascular system: S1 & S2 heard, RRR. No JVD, murmurs, rubs, gallops or clicks. No pedal edema. Gastrointestinal system: Abdomen is nondistended, soft and nontender. No organomegaly or masses felt. Normal bowel sounds heard. Central nervous system: Alert and oriented. No focal neurological deficits. Extremities: Symmetric 5 x 5 power. Skin: No rashes, lesions or ulcers Psychiatry: Judgement and insight appear normal. Mood & affect appropriate.     Data Reviewed: I have personally reviewed following labs and imaging studies  Micro Results No results found for this or any previous visit (from the past 240 hour(s)).  Radiology Reports Ct Head Wo Contrast  Result Date: 07/11/2016 CLINICAL DATA:  Headache for 3 months. EXAM: CT HEAD WITHOUT CONTRAST TECHNIQUE: Contiguous axial images were obtained from the base of the skull through the vertex without intravenous contrast. COMPARISON:  04/29/2016 FINDINGS: Brain: There is no intracranial hemorrhage, mass or evidence of acute infarction. There is no extra-axial fluid collection. Gray matter and white matter appear normal.  Cerebral volume is normal for age. Brainstem and posterior fossa are unremarkable. The CSF spaces appear normal. Vascular: No hyperdense vessel or unexpected calcification. Skull: Normal. Negative for fracture or focal lesion. Sinuses/Orbits: No acute finding. Other: None. IMPRESSION: Normal brain Electronically Signed   By: Ellery Plunk M.D.   On: 07/11/2016 21:06   Mr Brain Wo Contrast  Result Date: 07/06/2016 CLINICAL DATA:  Severe head pressure and blurred vision. Patient has neuro stimulator. Limited SAR study. EXAM: MRI HEAD WITHOUT CONTRAST TECHNIQUE: Multiplanar, multiecho pulse sequences of the brain and surrounding structures were obtained without intravenous contrast. COMPARISON:  Head CT 04/29/2016 FINDINGS: Brain: The brain has normal appearance without evidence of malformation, atrophy, old or acute small or large vessel infarction, hemorrhage, hydrocephalus or extra-axial collection. No pituitary abnormality. Vascular: Major vessels at the base of the brain show flow. Skull and upper cervical spine: Normal Sinuses/Orbits: Clear/ normal. Other: None significant. IMPRESSION: Normal brain MRI. No cause of the presenting symptoms is identified. Electronically Signed  By: Paulina FusiMark  Shogry M.D.   On: 07/06/2016 19:24   Dg Chest Port 1 View  Result Date: 07/11/2016 CLINICAL DATA:  Edema and migraine headache.  Renal failure. EXAM: PORTABLE CHEST 1 VIEW COMPARISON:  Two-view chest x-ray 04/28/2016 FINDINGS: A right IJ double-lumen Port-A-Cath extends into the right atrium. Heart is enlarged. Mild edema is present. No definite lesions are present. Mild bibasilar airspace disease likely reflects atelectasis. IMPRESSION: 1. Cardiomegaly with mild interstitial edema. 2. No focal airspace disease. 3. Stable right IJ Port-A-Cath. Electronically Signed   By: Marin Robertshristopher  Mattern M.D.   On: 07/11/2016 20:15   Mr Mrv Head Wo Cm  Result Date: 07/06/2016 CLINICAL DATA:  Severe head pressure and blurred  vision. Patient has neuro stimulator. Limited SAR study EXAM: MR VENOGRAM the HEAD WITHOUT CONTRAST TECHNIQUE: Angiographic images of the intracranial venous structures were obtained using MRV technique without intravenous contrast. COMPARISON:  Head study same day FINDINGS: Superior sagittal sinus is patent. Transverse and sigmoid sinuses are patent. Deep venous system and straight sinus are patent. No thrombosed cortical veins identified. IMPRESSION: Normal intracranial MR venography. Electronically Signed   By: Paulina FusiMark  Shogry M.D.   On: 07/06/2016 19:25   Mr Birdie HopesOrbits ZOWo Contrast  Result Date: 07/06/2016 CLINICAL DATA:  Severe head pressure and blurred vision. Patient has neuro stimulator. Limited SAR study EXAM: MRI OF THE ORBITS WITHOUT CONTRAST TECHNIQUE: Multiplanar, multisequence MR imaging of the orbits was performed. No intravenous contrast was administered. COMPARISON:  MRI and MRV same day FINDINGS: Orbits: Both globes appear normal. Optic nerves and nerve sheaths appear normal. Extra-ocular muscles are normal. Intraorbital fat is normal. Lacrimal glands are normal. Orbital apices are normal. Cavernous sinuses are normal. Visualized sinuses: Clear and normal Soft tissues: No significant finding Limited intracranial: Negative as on the full study. IMPRESSION: Normal orbital exam. Electronically Signed   By: Paulina FusiMark  Shogry M.D.   On: 07/06/2016 19:27     CBC  Recent Labs Lab 07/11/16 1846 07/11/16 1913  WBC 6.3  --   HGB 11.5* 11.6*  HCT 36.2 34.0*  PLT 196  --   MCV 95.3  --   MCH 30.3  --   MCHC 31.8  --   RDW 14.1  --   LYMPHSABS 2.5  --   MONOABS 0.4  --   EOSABS 0.1  --   BASOSABS 0.0  --     Chemistries   Recent Labs Lab 07/11/16 1846 07/11/16 1913 07/12/16 0704  NA 139 140 140  K 3.4* 3.5 4.5  CL 101 104 102  CO2 26  --  28  GLUCOSE 117* 120* 111*  BUN 39* 41* 36*  CREATININE 2.28* 2.40* 2.27*  CALCIUM 9.5  --  9.4  MG  --   --  2.6*  AST 24  --   --   ALT 16   --   --   ALKPHOS 36*  --   --   BILITOT 1.0  --   --    ------------------------------------------------------------------------------------------------------------------ estimated creatinine clearance is 37.4 mL/min (by C-G formula based on SCr of 2.27 mg/dL (H)). ------------------------------------------------------------------------------------------------------------------ No results for input(s): HGBA1C in the last 72 hours. ------------------------------------------------------------------------------------------------------------------ No results for input(s): CHOL, HDL, LDLCALC, TRIG, CHOLHDL, LDLDIRECT in the last 72 hours. ------------------------------------------------------------------------------------------------------------------ No results for input(s): TSH, T4TOTAL, T3FREE, THYROIDAB in the last 72 hours.  Invalid input(s): FREET3 ------------------------------------------------------------------------------------------------------------------ No results for input(s): VITAMINB12, FOLATE, FERRITIN, TIBC, IRON, RETICCTPCT in the last 72 hours.  Coagulation profile No results for  input(s): INR, PROTIME in the last 168 hours.  No results for input(s): DDIMER in the last 72 hours.  Cardiac Enzymes No results for input(s): CKMB, TROPONINI, MYOGLOBIN in the last 168 hours.  Invalid input(s): CK ------------------------------------------------------------------------------------------------------------------ Invalid input(s): POCBNP   CBG:  Recent Labs Lab 07/12/16 0928  GLUCAP 102*       Studies: Ct Head Wo Contrast  Result Date: 07/11/2016 CLINICAL DATA:  Headache for 3 months. EXAM: CT HEAD WITHOUT CONTRAST TECHNIQUE: Contiguous axial images were obtained from the base of the skull through the vertex without intravenous contrast. COMPARISON:  04/29/2016 FINDINGS: Brain: There is no intracranial hemorrhage, mass or evidence of acute infarction. There is no  extra-axial fluid collection. Gray matter and white matter appear normal. Cerebral volume is normal for age. Brainstem and posterior fossa are unremarkable. The CSF spaces appear normal. Vascular: No hyperdense vessel or unexpected calcification. Skull: Normal. Negative for fracture or focal lesion. Sinuses/Orbits: No acute finding. Other: None. IMPRESSION: Normal brain Electronically Signed   By: Ellery Plunk M.D.   On: 07/11/2016 21:06   Dg Chest Port 1 View  Result Date: 07/11/2016 CLINICAL DATA:  Edema and migraine headache.  Renal failure. EXAM: PORTABLE CHEST 1 VIEW COMPARISON:  Two-view chest x-ray 04/28/2016 FINDINGS: A right IJ double-lumen Port-A-Cath extends into the right atrium. Heart is enlarged. Mild edema is present. No definite lesions are present. Mild bibasilar airspace disease likely reflects atelectasis. IMPRESSION: 1. Cardiomegaly with mild interstitial edema. 2. No focal airspace disease. 3. Stable right IJ Port-A-Cath. Electronically Signed   By: Marin Roberts M.D.   On: 07/11/2016 20:15      No results found for: HGBA1C Lab Results  Component Value Date   CREATININE 2.27 (H) 07/12/2016       Scheduled Meds: . acetaZOLAMIDE  500 mg Oral BID  . allopurinol  100 mg Oral BID  . aspirin  81 mg Oral Daily  . cholecalciferol  1,000 Units Oral Daily  . clonazePAM  0.5 mg Oral QHS  . cycloSPORINE modified  125 mg Oral BID  . famotidine (PEPCID) IV  20 mg Intravenous Q12H  . flavoxATE  100 mg Oral TID  . furosemide  40 mg Oral Daily  . gemfibrozil  600 mg Oral Daily  . heparin  5,000 Units Subcutaneous Q8H  . Icosapent Ethyl  1 capsule Oral BID  . levETIRAcetam  500 mg Oral BID  . magnesium oxide  800 mg Oral BID  . metoprolol  50 mg Oral BID  . naloxone      . norethindrone  1 tablet Oral Daily  . omega-3 acid ethyl esters  1 g Oral BID  . pravastatin  40 mg Oral QHS  . predniSONE  2.5 mg Oral Q breakfast  . sertraline  100 mg Oral BID  . sodium  chloride flush  3 mL Intravenous Q12H  . Teriparatide (Recombinant)  20 mcg Subcutaneous Daily   Continuous Infusions:   LOS: 1 day    Time spent: >30 MINS    [facility]  [facility] Pager [phone]. If 7PM-7AM, please contact night-coverage at [email or web address], password [facility] 07/12/2016, 10:13 AM  LOS: 1 day

## 2016-07-12 NOTE — Progress Notes (Signed)
Received pt from the ED alert but very sleepy. Pt oriented to unit and room. Call bell within reach of pt. Will cont to monitor.

## 2016-07-12 NOTE — Progress Notes (Signed)
Notified MD Abrol of ABG results below.  Contacted RT and they will come and make adjustments to the Bipap machine.  Will continue to monitor pt.       Ref. Range 07/12/2016 18:30  Sample type Unknown ARTERIAL DRAW  Delivery systems Unknown BILEVEL POSITIVE .[name].[name].  FIO2 Unknown 40.00  Mode Unknown BILEVEL POSITIVE .[name].[name].  Inspiratory PAP Unknown 12.0  Expiratory PAP Unknown 6.0  pH, Arterial Latest Ref Range: 7.350 - 7.450  7.250 (L)  pCO2 arterial Latest Ref Range: 32.0 - 48.0 mmHg 66.7 (HH)  pO2, Arterial Latest Ref Range: 83.0 - 108.0 mmHg 136 (H)  Acid-Base Excess Latest Ref Range: 0.0 - 2.0 mmol/L 1.7  Bicarbonate Latest Ref Range: 20.0 - 28.0 mmol/L 28.3 (H)  O2 Saturation Latest Units: % 98.5  Patient temperature Unknown 98.6  Collection site Unknown LEFT RADIAL  Allens test (pass/fail) Latest Ref Range: PASS  PASS

## 2016-07-12 NOTE — Progress Notes (Signed)
Patient transported to room 3S02 without any apparent complications. RT will monitor.

## 2016-07-12 NOTE — Consult Note (Signed)
[facility] Renal Consultation Note  Requesting MD:  Indication for Consultation:   HPI: Donna Shannon is a 31 y.o. 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 She underwent her 1st kidney transplant in 1996 at the age of 61 the donor was her father. Unfortunately, they kidney rejected in 1998. She underwent a 2nd kidney transplant in 1998  with her mother as a donor and has done relatively well since. 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 She developed transaminitis with Prograf so is on cyclosporine. Her creatinine previously was variable in the high 1's. Patient has a Port-A-Cath in place for frequent lab draws and also apparently receives IV fluids not infrequently for elevated creatinine? Because she does in and out bladder catheterizations she does get fairly frequent urinary tract infections. Her other significant past medical history includes factor 5 Leiden- she was on Coumadin in the past but now that has been discontinued. She has followed with me since March of 2017- lives in [place]. Past medical history also remarkable for anxiety and pain requiring a number of sedating/comfort/pain meds.  I saw her in June of 17 - She had a UTI treated with levaquin- follow up urine was clear- creatinine in August was 1.6. She then notified me in September of 17 that she had PNA- treated with levaquin. Then she had been started on lyrica by Dr. Amalia Shannon- she began swelling and it was thought due to that med so was stopped. However, swelling did not improve. She eventually required hospital admission in October of 17 for this volume overload- she diuresed successfully but did have some A on CRF. Also of note in the hospital she had migraines, saw neuro- they did LP  and felt she had psuedotumor cerebri and put her on diamox.   She returned to see me on 1/2. she comes in with her Mom who was concerned about Donna Shannon's mental state. Apparently she has had neurologic sxms and they are getting worse per the mother. They saw Dr. Katy Shannon with opthamalmology who felt that she did have increased intracranial pressure and that was affecting her optic nerves. She saw neuro-. Imaging is really unremarkable and sleep study was ordered. She eventually talked with pts neurologist and pt was admitted for further management. Regarding kidney function most recent creatinine was 2.16 which is about her new baseline. She did have evidence of a UTI with E. coli and was started on bactrim by me on 1/2.  Creatinine was a little above baseline but was on bactrim.  Neuro is seeing pt- concern about "cyclophosphamide" which she is not on - she is on cyclosporine and also feels that pt needs diuresis- also she had an episode of hypercarbic resp failure- given narcan and is now on bipap   Creatinine, Ser  Date/Time Value Ref Range Status  07/12/2016 07:04 AM 2.27 (H) 0.44 - 1.00 mg/dL Final  07/11/2016 07:13 PM 2.40 (H) 0.44 - 1.00 mg/dL Final  07/11/2016 06:46 PM 2.28 (H) 0.44 - 1.00 mg/dL Final  06/21/2016 04:23 PM 1.98 (H) 0.57 - 1.00 mg/dL Final  04/30/2016 05:00 AM 2.24 (H) 0.44 - 1.00 mg/dL Final  04/29/2016 12:32 PM 2.02 (H) 0.44 - 1.00 mg/dL Final  04/29/2016 09:40 AM 2.12 (H) 0.44 - 1.00 mg/dL Final  04/28/2016 05:40 AM 2.63 (H) 0.44 - 1.00 mg/dL Final  04/27/2016  11:25 AM 3.08 (H) 0.44 - 1.00 mg/dL Final  04/26/2016 05:45 AM 2.86 (H) 0.44 - 1.00 mg/dL Final  04/25/2016 01:57 AM 2.22 (H) 0.44 - 1.00 mg/dL Final  05/14/2008 10:11 AM 1.30 (H)  Final     PMHx:   Past Medical History:  Diagnosis Date  . Anxiety   . Arthritis    "left knee" (04/24/2016)  . Chronic lower back pain   . Depression   . Essential hypertension   . Factor V Leiden ([place])    Donna Shannon  04/24/2016  . Frequent UTI    Donna Shannon 04/24/2016  . Gout   . High cholesterol   . Migraine    "weekly" (04/24/2016)  . Neurogenic bladder    augmented bladder, I/O self caths  . On home oxygen therapy    "2L; every night" (04/24/2016)  . Ovarian cyst dx'd 04/22/2016   left  . Pneumonia 03/2016   Donna Shannon 04/24/2016  . Renal transplant failure and rejection    age 32-11  . 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 Self-catheterizes urinary bladder    "~ 12 X/day" (04/24/2016)  . Stroke Donna Shannon) 1991   denies residual on 04/24/2016    Past Surgical History:  Procedure Laterality Date  . ABDOMINAL HERNIA REPAIR  "several; when I was little"  . allograph biopsy  2013   Donna Shannon 04/24/2016  . HERNIA REPAIR    . Donna Shannon; 1998   father was donor; mother was donor/notes 04/24/2016  . PACEMAKER INSERTION  "?early 2000s"   for bladder function  . PARTIAL NEPHRECTOMY  1990s X 5   "removing disease"  . PORTACATH PLACEMENT Right 11/2015  . RIGHT OOPHORECTOMY Right 2001   ovarian torsion    Family Hx:  Family History  Problem Relation Age of Onset  . CAD Father     s/p CABG    Social History:  reports that she quit smoking about 2 years ago. Her smoking use included Cigarettes. She has a 2.00 pack-year smoking history. She has never used smokeless tobacco. She reports that she drinks alcohol. She reports that she does not use drugs.  Allergies:  Allergies  Allergen Reactions  . Sulfa Antibiotics Itching    Medications: Prior to Admission medications   Medication Sig Start Date End Date Taking? Authorizing Provider  acetaZOLAMIDE (DIAMOX) 250 MG tablet Take 2 tablets (500 mg total) by mouth 2 (two) times daily. 06/21/16  Yes Melvenia Beam, MD  albuterol (PROVENTIL HFA;VENTOLIN HFA) 108 (90 Base) MCG/ACT inhaler Inhale 2 puffs into the lungs every 6 (six) hours as needed for wheezing or shortness of breath.   Yes  Historical Provider, MD  allopurinol (ZYLOPRIM) 100 MG tablet Take 100 mg by mouth 2 (two) times daily.   Yes Historical Provider, MD  aspirin 81 MG chewable tablet Chew 81 mg by mouth daily.   Yes Historical Provider, MD  butalbital-acetaminophen-caffeine (FIORICET, ESGIC) 50-325-40 MG tablet Take 1 tablet by mouth 2 (two) times daily as needed for headache.   Yes Historical Provider, MD  Cholecalciferol (VITAMIN D-3) 1000 units CAPS Take 1 capsule by mouth daily.   Yes Historical Provider, MD  clonazePAM (KLONOPIN) 0.5 MG tablet Take 0.5 mg by mouth See admin instructions. Take 2 tablets in the morning, then take one in the afternoon, then take 2 tablets at night per patient   Yes Historical Provider, MD  cycloSPORINE modified (NEORAL) 100 MG capsule  Take 100 mg by mouth 2 (two) times daily. Patient takes along with the 73m neoral tablet to make full dose of 1276mby mouth twice daily per patient   Yes Historical Provider, MD  cycloSPORINE modified (NEORAL) 25 MG capsule Take 25 mg by mouth 2 (two) times daily. Take along with the 10062mablet to make a full dose of 125m96mblet by mouth daily per patient   Yes Historical Provider, MD  fenofibrate 160 MG tablet Take 160 mg by mouth daily.   Yes Historical Provider, MD  flavoxATE (URISPAS) 100 MG tablet Take 100 mg by mouth 3 (three) times daily. Take every day per patient   Yes Historical Provider, MD  furosemide (LASIX) 40 MG tablet Take 40 mg by mouth daily.   Yes Historical Provider, MD  gabapentin (NEURONTIN) 300 MG capsule Take 300 mg by mouth 2 (two) times daily.    Yes Historical Provider, MD  gemfibrozil (LOPID) 600 MG tablet Take 600 mg by mouth daily.    Yes Historical Provider, MD  Icosapent Ethyl (VASCEPA) 1 g CAPS Take 1 capsule by mouth 2 (two) times daily.   Yes Historical Provider, MD  levETIRAcetam (KEPPRA) 500 MG tablet Take 500 mg by mouth 2 (two) times daily.   Yes Historical Provider, MD  magnesium oxide (MAG-OX) 400 MG tablet  Take 800 mg by mouth 2 (two) times daily.   Yes Historical Provider, MD  methylphenidate (RITALIN) 10 MG tablet Take 10 mg by mouth 2 (two) times daily.   Yes Historical Provider, MD  metoprolol (LOPRESSOR) 50 MG tablet Take 1 tablet (50 mg total) by mouth 2 (two) times daily. 04/30/16  Yes CarlBarton Dubois  norethindrone (MICRONOR,CAMILA,ERRIN) 0.35 MG tablet Take 1 tablet by mouth daily.   Yes Historical Provider, MD  omega-3 acid ethyl esters (LOVAZA) 1 g capsule Take 1 g by mouth 3 (three) times daily.   Yes Historical Provider, MD  oxybutynin (DITROPAN) 5 MG tablet Take 1 tablet (5 mg total) by mouth every 8 (eight) hours as needed for bladder spasms. 04/30/16  Yes CarlBarton Dubois  pravastatin (PRAVACHOL) 40 MG tablet Take 40 mg by mouth at bedtime.   Yes Historical Provider, MD  predniSONE (DELTASONE) 2.5 MG tablet Take 2.5 mg by mouth daily with breakfast.   Yes Historical Provider, MD  prochlorperazine (COMPAZINE) 10 MG tablet Take 1 tablet (10 mg total) by mouth every 6 (six) hours as needed for nausea or vomiting. 06/25/16  Yes AntoMelvenia Beam  promethazine (PHENERGAN) 25 MG suppository Place 1 suppository (25 mg total) rectally every 6 (six) hours as needed for nausea or vomiting. 06/25/16  Yes AntoMelvenia Beam  ranitidine (ZANTAC) 150 MG tablet Take 150 mg by mouth every evening.   Yes Historical Provider, MD  rizatriptan (MAXALT) 10 MG tablet Take 1 tablet (10 mg total) by mouth daily as needed for migraine. May repeat in 2 hours if needed 06/25/16  Yes AntoMelvenia Beam  sertraline (ZOLOFT) 100 MG tablet Take 100 mg by mouth 2 (two) times daily.   Yes Historical Provider, MD  Teriparatide, Recombinant, (FORTEO) 600 MCG/2.4ML SOLN Inject 20 mcg into the skin daily. Take every day per patient   Yes Historical Provider, MD  traMADol (ULTRAM) 50 MG tablet Take 50 mg by mouth every 6 (six) hours as needed for moderate pain.   Yes Historical Provider, MD  zolpidem (AMBIEN) 10 MG  tablet Take 10 mg by mouth at bedtime as needed  for sleep.   Yes Historical Provider, MD    I have reviewed the patient's current medications.  Labs:  Results for orders placed or performed during the hospital encounter of 07/11/16 (from the past 48 hour(s))  CBC with Differential     Status: Abnormal   Collection Time: 07/11/16  6:46 PM  Result Value Ref Range   WBC 6.3 4.0 - 10.5 K/uL   RBC 3.80 (L) 3.87 - 5.11 MIL/uL   Hemoglobin 11.5 (L) 12.0 - 15.0 g/dL   HCT 36.2 36.0 - 46.0 %   MCV 95.3 78.0 - 100.0 fL   MCH 30.3 26.0 - 34.0 pg   MCHC 31.8 30.0 - 36.0 g/dL   RDW 14.1 11.5 - 15.5 %   Platelets 196 150 - 400 K/uL   Neutrophils Relative % 52 %   Neutro Abs 3.2 1.7 - 7.7 K/uL   Lymphocytes Relative 40 %   Lymphs Abs 2.5 0.7 - 4.0 K/uL   Monocytes Relative 7 %   Monocytes Absolute 0.4 0.1 - 1.0 K/uL   Eosinophils Relative 1 %   Eosinophils Absolute 0.1 0.0 - 0.7 K/uL   Basophils Relative 0 %   Basophils Absolute 0.0 0.0 - 0.1 K/uL  Comprehensive metabolic panel     Status: Abnormal   Collection Time: 07/11/16  6:46 PM  Result Value Ref Range   Sodium 139 135 - 145 mmol/L   Potassium 3.4 (L) 3.5 - 5.1 mmol/L   Chloride 101 101 - 111 mmol/L   CO2 26 22 - 32 mmol/L   Glucose, Bld 117 (H) 65 - 99 mg/dL   BUN 39 (H) 6 - 20 mg/dL   Creatinine, Ser 2.28 (H) 0.44 - 1.00 mg/dL   Calcium 9.5 8.9 - 10.3 mg/dL   Total Protein 7.1 6.5 - 8.1 g/dL   Albumin 4.0 3.5 - 5.0 g/dL   AST 24 15 - 41 U/L   ALT 16 14 - 54 U/L   Alkaline Phosphatase 36 (L) 38 - 126 U/L   Total Bilirubin 1.0 0.3 - 1.2 mg/dL   GFR calc non Af Amer 27 (L) >60 mL/min   GFR calc Af Amer 32 (L) >60 mL/min    Comment: (NOTE) The eGFR has been calculated using the CKD EPI equation. This calculation has not been validated in all clinical situations. eGFR's persistently <60 mL/min signify possible Chronic Kidney Disease.    Anion gap 12 5 - 15  Brain natriuretic peptide     Status: None   Collection Time:  07/11/16  7:04 PM  Result Value Ref Range   B Natriuretic Peptide 7.3 0.0 - 100.0 pg/mL  I-stat Chem 8, ED     Status: Abnormal   Collection Time: 07/11/16  7:13 PM  Result Value Ref Range   Sodium 140 135 - 145 mmol/L   Potassium 3.5 3.5 - 5.1 mmol/L   Chloride 104 101 - 111 mmol/L   BUN 41 (H) 6 - 20 mg/dL   Creatinine, Ser 2.40 (H) 0.44 - 1.00 mg/dL   Glucose, Bld 120 (H) 65 - 99 mg/dL   Calcium, Ion 1.23 1.15 - 1.40 mmol/L   TCO2 28 0 - 100 mmol/L   Hemoglobin 11.6 (L) 12.0 - 15.0 g/dL   HCT 34.0 (L) 36.0 - 66.2 %  Basic metabolic panel     Status: Abnormal   Collection Time: 07/12/16  7:04 AM  Result Value Ref Range   Sodium 140 135 - 145 mmol/L   Potassium  4.5 3.5 - 5.1 mmol/L   Chloride 102 101 - 111 mmol/L   CO2 28 22 - 32 mmol/L   Glucose, Bld 111 (H) 65 - 99 mg/dL   BUN 36 (H) 6 - 20 mg/dL   Creatinine, Ser 2.27 (H) 0.44 - 1.00 mg/dL   Calcium 9.4 8.9 - 10.3 mg/dL   GFR calc non Af Amer 27 (L) >60 mL/min   GFR calc Af Amer 32 (L) >60 mL/min    Comment: (NOTE) The eGFR has been calculated using the CKD EPI equation. This calculation has not been validated in all clinical situations. eGFR's persistently <60 mL/min signify possible Chronic Kidney Disease.    Anion gap 10 5 - 15  Magnesium     Status: Abnormal   Collection Time: 07/12/16  7:04 AM  Result Value Ref Range   Magnesium 2.6 (H) 1.7 - 2.4 mg/dL  Glucose, capillary     Status: Abnormal   Collection Time: 07/12/16  9:28 AM  Result Value Ref Range   Glucose-Capillary 102 (H) 65 - 99 mg/dL  Blood gas, arterial     Status: Abnormal   Collection Time: 07/12/16 10:04 AM  Result Value Ref Range   FIO2 32.00    Delivery systems NASAL CANNULA    pH, Arterial 7.251 (L) 7.350 - 7.450   pCO2 arterial 65.3 (HH) 32.0 - 48.0 mmHg    Comment: CRITICAL RESULT CALLED TO, READ BACK BY AND VERIFIED WITH: KELLY CREINICH RN AT 1011 BY ASHLEY HOLCOMB RCP, RRT ON 07/12/2016    pO2, Arterial 81.2 (L) 83.0 - 108.0 mmHg    Bicarbonate 27.8 20.0 - 28.0 mmol/L   Acid-Base Excess 1.2 0.0 - 2.0 mmol/L   O2 Saturation 95.9 %   Patient temperature 98.2    Collection site REVIEWED BY    Drawn by [id number]    Sample type ARTERIAL DRAW    Allens test (pass/fail) PASS PASS     ROS:  A comprehensive review of systems was negative except for: Constitutional: positive for fatigue Ears, nose, mouth, throat, and face: positive for facal edema Neurological: positive for coordination problems and memory problems and visual changes  Physical Exam: Vitals:   07/12/16 0930 07/12/16 1209  BP: 114/77 108/84  Pulse: 93 96  Resp: 15 17  Temp: 99 F (37.2 C) 99 F (37.2 C)     General: obese WF on bipap face looks puffy HEENT: PERRLA, EOMI, Mucous membranes moist Neck: Probable JVD Heart: Almost tachycardic Lungs: Mostly clear Abdomen: Obese, soft, nontender Extremities: Pale, obese bili minimal pitting  Skin: Warm and dry Neuro: Somnolent but arousable  Assessment/Plan: 31 year old white female status post her second kidney transplant in 1998 on cyclosporine, mycophenolate and prednisone who is had an issue of late with migraines and possible pseudotumor cerebri 1.Renal- status post second renal transplant in 1998. Baseline creatinine is now around 2. She recently had an Escherichia coli urinary tract infection and was placed on Bactrim. Creatinine is now 2.2-2.4 which could be due to that versus UTI versus just clinical scenario. She is making good urine. I will agree to diurese her a little bit but given her tachycardia I don't feel that she is that total body volume overloaded 2. Neuro  - this seems to be her biggest issue right now. Mother was very concerned that Mishayla has changed and it's getting worse. She's always been on a lot of these comfort and sedating medications that I tried to decrease the patient is not willing. Patient  tells me that when she had the LP the first time it did improve her clinical  symptoms so I wonder if she does have the pseudotumor cerebri. She's currently on Diamox but that is not controlling her symptoms. The issue of cyclophosphamide causing pseudotumor cerebri is interesting but unfortunately the patient is not on cyclophosphamide she's on cyclosporine so I'm not sure her regimen needs to be changed 3. Volume- as above are not sure she's total body volume overloaded. I actually wonder if she's got an SVC syndrome given that most of her fluid seems to be in her head and neck. She has a Port-A-Cath in place so unfortunately I think that would be difficult to remedy 4. Anemia  - is not a significant issue at this time 5. UTI- she had as outpatient it was Escherichia coli sensitive to ceftriaxone 12 give her that  6. Status post renal transplant- continue her regimen of cyclosporine 125 twice a day, mycophenolate 500 twice a day, prednisone 2.5 mg daily  Thank you for consult. I will follow with you   Osborne Serio A 07/12/2016, 1:33 PM

## 2016-07-13 ENCOUNTER — Inpatient Hospital Stay (HOSPITAL_COMMUNITY): Payer: Medicare Other

## 2016-07-13 DIAGNOSIS — R609 Edema, unspecified: Secondary | ICD-10-CM

## 2016-07-13 LAB — CBC
HCT: 37.7 % (ref 36.0–46.0)
Hemoglobin: 11.8 g/dL — ABNORMAL LOW (ref 12.0–15.0)
MCH: 29.8 pg (ref 26.0–34.0)
MCHC: 31.3 g/dL (ref 30.0–36.0)
MCV: 95.2 fL (ref 78.0–100.0)
Platelets: 189 10*3/uL (ref 150–400)
RBC: 3.96 MIL/uL (ref 3.87–5.11)
RDW: 14 % (ref 11.5–15.5)
WBC: 5.5 10*3/uL (ref 4.0–10.5)

## 2016-07-13 LAB — BASIC METABOLIC PANEL
Anion gap: 12 (ref 5–15)
BUN: 41 mg/dL — AB (ref 6–20)
CALCIUM: 9.4 mg/dL (ref 8.9–10.3)
CHLORIDE: 103 mmol/L (ref 101–111)
CO2: 27 mmol/L (ref 22–32)
CREATININE: 2.84 mg/dL — AB (ref 0.44–1.00)
GFR calc Af Amer: 24 mL/min — ABNORMAL LOW (ref >60)
GFR calc non Af Amer: 21 mL/min — ABNORMAL LOW (ref >60)
Glucose, Bld: 108 mg/dL — ABNORMAL HIGH (ref 65–99)
Potassium: 3.9 mmol/L (ref 3.5–5.1)
Sodium: 142 mmol/L (ref 135–145)

## 2016-07-13 LAB — CSF CELL COUNT WITH DIFFERENTIAL
RBC Count, CSF: 12 /mm3 — ABNORMAL HIGH
Tube #: 3
WBC, CSF: 4 /mm3 (ref 0–5)

## 2016-07-13 LAB — BLOOD GAS, ARTERIAL
ACID-BASE EXCESS: 2.7 mmol/L — AB (ref 0.0–2.0)
Bicarbonate: 28.4 mmol/L — ABNORMAL HIGH (ref 20.0–28.0)
DELIVERY SYSTEMS: POSITIVE
Drawn by: 345601
Expiratory PAP: 5
FIO2: 40
INSPIRATORY PAP: 14
O2 Saturation: 98.1 %
PH ART: 7.312 — AB (ref 7.350–7.450)
Patient temperature: 98.6
pCO2 arterial: 57.8 mmHg — ABNORMAL HIGH (ref 32.0–48.0)
pO2, Arterial: 114 mmHg — ABNORMAL HIGH (ref 83.0–108.0)

## 2016-07-13 LAB — PROTEIN, CSF: Total  Protein, CSF: 38 mg/dL (ref 15–45)

## 2016-07-13 LAB — GLUCOSE, CAPILLARY: GLUCOSE-CAPILLARY: 313 mg/dL — AB (ref 65–99)

## 2016-07-13 LAB — GLUCOSE, CSF: Glucose, CSF: 73 mg/dL — ABNORMAL HIGH (ref 40–70)

## 2016-07-13 MED ORDER — OXYCODONE HCL 5 MG PO TABS
5.0000 mg | ORAL_TABLET | ORAL | Status: DC | PRN
  Administered 2016-07-13 – 2016-07-14 (×8): 5 mg via ORAL
  Filled 2016-07-13 (×8): qty 1

## 2016-07-13 MED ORDER — FAMOTIDINE IN NACL 20-0.9 MG/50ML-% IV SOLN
20.0000 mg | INTRAVENOUS | Status: DC
  Administered 2016-07-14 – 2016-07-15 (×2): 20 mg via INTRAVENOUS
  Filled 2016-07-13 (×2): qty 50

## 2016-07-13 MED ORDER — LIDOCAINE HCL (PF) 1 % IJ SOLN
5.0000 mL | Freq: Once | INTRAMUSCULAR | Status: AC
  Administered 2016-07-13: 5 mL via INTRADERMAL

## 2016-07-13 MED ORDER — LIDOCAINE HCL 1 % IJ SOLN
INTRAMUSCULAR | Status: AC
  Filled 2016-07-13: qty 10

## 2016-07-13 NOTE — Progress Notes (Signed)
Pt taken off Bipap and placed on a nasal cannula 5L, sat 97%. RN notified.

## 2016-07-13 NOTE — Progress Notes (Signed)
**  Preliminary report by tech**  Bilateral upper extremity venous duplex complete. There is no evidence of deep or superficial vein thrombosis involving the right and left upper extremities. All visualized vessels appear patent and compressible.  07/13/16 5:40 PM Olen CordialGreg Reyana Leisey RVT

## 2016-07-13 NOTE — Care Management Note (Signed)
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  Patient was over sedated , was on bipap and brought to [facility].  NCM will cont to follow for dc needs.                   Action/Plan:   Expected Discharge Date:  07/16/16               Expected Discharge Plan:  Home/Self Care  In-House Referral:     Discharge planning Services  CM Consult  Post Acute Care Choice:    Choice offered to:     DME Arranged:    DME Agency:     HH Arranged:    HH Agency:     Status of Service:  In process, will continue to follow  If discussed at Long Length of Stay Meetings, dates discussed:    Additional Comments:  Leone Havenaylor, [place] Mand[place], RN 07/13/2016, 4:36 PM

## 2016-07-13 NOTE — Progress Notes (Signed)
Subjective: Had LP, now complaining of back pain.   Exam: Vitals:   07/13/16 0722 07/13/16 1100  BP: 105/77 116/86  Pulse: 90 83  Resp: 17 16  Temp: 98.7 F (37.1 C) 98.2 F (36.8 C)   Gen: In bed, NAD Resp: non-labored breathing, no acute distress Abd: soft, nt  Neuro: MS: awake, alert, oriented ZO:XWRUECN:PERRL, EOMI Motor: MAEW  Sensory: intact to LT  Pertinent Labs: OP 22   Impression: 31 yo F with eelvated ICPs and papilledema. 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 I have recommended further workup for this, but her renal dysfunction is  Impaired and she is already s/p two kidney transplants.   Mediastinal fibrosis may need to be a consideration as well given history of retroperitoneal fibrosis.   With report of decreased acuity and restriction of fields reported, I would be concerned about further vision loss if this is not addressed.   Recommendations: 1) CT chest w/o contrast 2) continue to consider removing port-a-cath 3) continue diamox  Ritta SlotMcNeill Kirkpatrick, MD [facility] [phone]  If 7pm- 7am, please page neurology on call as listed in AMION.

## 2016-07-13 NOTE — Progress Notes (Signed)
Subjective:  Very sleepy this AM- "Im ready to go home" fine UOP- creatinine increased Objective Vital signs in last 24 hours: Vitals:   07/13/16 0306 07/13/16 0448 07/13/16 0500 07/13/16 0722  BP: 119/81   105/77  Pulse: 86 85  90  Resp: 14 18  17   Temp: 97.5 F (36.4 C)   98.7 F (37.1 C)  TempSrc: Axillary   Oral  SpO2: 95% 100%  97%  Weight:   90.3 kg (199 lb 1.2 oz)   Height:       Weight change: -0.219 kg (-7.7 oz)  Intake/Output Summary (Last 24 hours) at 07/13/16 0809 Last data filed at 07/13/16 [id number]  Gross per 24 hour  Intake          1001.66 ml  Output             1500 ml  Net          -498.34 ml    Assessment/Plan: 31 year old white female status post her second kidney transplant in 1998 on cyclosporine, mycophenolate and prednisone who is had an issue of late with migraines and possible pseudotumor cerebri 1.Renal- status post second renal transplant in 1998. Baseline creatinine was around 2. She recently had an Escherichia coli urinary tract infection and was placed on Bactrim. Creatinine is now 2.8 which could be due to bactrim versus UTI versus just clinical scenario. She is making good urine. I will agree to diurese her a little bit but given her tachycardia I don't feel that she is that total body volume overloaded 2. Neuro  - this seems to be her biggest issue right now. Mother feels Judeth CornfieldStephanie has changed neurologically and it's getting worse. She's always been on a lot of these comfort and sedating medications that I tried to decrease but the patient has  Not been willing. Patient tells me that when she had the LP the first time it did improve her clinical symptoms. She's currently on Diamox. Now concern over SVC syndrome causing this whole clinical picture 3. Volume- as above are not sure she's total body volume overloaded. I actually wonder about SVC syndrome given that most of her fluid seems to be in her head and neck. She has a Port-A-Cath in place so  unfortunately I think that would be difficult to remedy- she may ned removal of portacath- neuro to discuss with radiology best approach 4. Anemia  - is not a significant issue at this time 5. UTI- she had as outpatient it was Escherichia coli sensitive to ceftriaxone -  Treat her with that 6. Status post renal transplant- continue her regimen of cyclosporine 125 twice a day, mycophenolate 500 twice a day, prednisone 2.5 mg daily    Alger Kerstein A    Labs: Basic Metabolic Panel:  Recent Labs Lab 07/11/16 1846 07/11/16 1913 07/12/16 0704 07/13/16 0553  NA 139 140 140 142  K 3.4* 3.5 4.5 3.9  CL 101 104 102 103  CO2 26  --  28 27  GLUCOSE 117* 120* 111* 108*  BUN 39* 41* 36* 41*  CREATININE 2.28* 2.40* 2.27* 2.84*  CALCIUM 9.5  --  9.4 9.4   Liver Function Tests:  Recent Labs Lab 07/11/16 1846  AST 24  ALT 16  ALKPHOS 36*  BILITOT 1.0  PROT 7.1  ALBUMIN 4.0   No results for input(s): LIPASE, AMYLASE in the last 168 hours. No results for input(s): AMMONIA in the last 168 hours. CBC:  Recent Labs Lab 07/11/16 1846 07/11/16 1913  07/13/16 0553  WBC 6.3  --  5.5  NEUTROABS 3.2  --   --   HGB 11.5* 11.6* 11.8*  HCT 36.2 34.0* 37.7  MCV 95.3  --  95.2  PLT 196  --  189   Cardiac Enzymes: No results for input(s): CKTOTAL, CKMB, CKMBINDEX, TROPONINI in the last 168 hours. CBG:  Recent Labs Lab 07/12/16 0928  GLUCAP 102*    Iron Studies: No results for input(s): IRON, TIBC, TRANSFERRIN, FERRITIN in the last 72 hours. Studies/Results: Ct Head Wo Contrast  Result Date: 07/11/2016 CLINICAL DATA:  Headache for 3 months. EXAM: CT HEAD WITHOUT CONTRAST TECHNIQUE: Contiguous axial images were obtained from the base of the skull through the vertex without intravenous contrast. COMPARISON:  04/29/2016 FINDINGS: Brain: There is no intracranial hemorrhage, mass or evidence of acute infarction. There is no extra-axial fluid collection. Gray matter and white matter  appear normal. Cerebral volume is normal for age. Brainstem and posterior fossa are unremarkable. The CSF spaces appear normal. Vascular: No hyperdense vessel or unexpected calcification. Skull: Normal. Negative for fracture or focal lesion. Sinuses/Orbits: No acute finding. Other: None. IMPRESSION: Normal brain Electronically Signed   By: Ellery Plunk M.D.   On: 07/11/2016 21:06   Dg Chest Port 1 View  Result Date: 07/11/2016 CLINICAL DATA:  Edema and migraine headache.  Renal failure. EXAM: PORTABLE CHEST 1 VIEW COMPARISON:  Two-view chest x-ray 04/28/2016 FINDINGS: A right IJ double-lumen Port-A-Cath extends into the right atrium. Heart is enlarged. Mild edema is present. No definite lesions are present. Mild bibasilar airspace disease likely reflects atelectasis. IMPRESSION: 1. Cardiomegaly with mild interstitial edema. 2. No focal airspace disease. 3. Stable right IJ Port-A-Cath. Electronically Signed   By: Marin Roberts M.D.   On: 07/11/2016 20:15   Medications: Infusions:   Scheduled Medications: . acetaZOLAMIDE  500 mg Oral BID  . allopurinol  100 mg Oral BID  . aspirin  81 mg Oral Daily  . cefTRIAXone (ROCEPHIN)  IV  1 g Intravenous Q24H  . chlorhexidine  15 mL Mouth Rinse BID  . cholecalciferol  1,000 Units Oral Daily  . clonazePAM  0.5 mg Oral QHS  . cycloSPORINE modified  125 mg Oral BID  . famotidine (PEPCID) IV  20 mg Intravenous Q12H  . flavoxATE  100 mg Oral TID  . furosemide  40 mg Intravenous Q12H  . gemfibrozil  600 mg Oral Daily  . Icosapent Ethyl  1 capsule Oral BID  . levETIRAcetam  500 mg Oral BID  . magnesium oxide  800 mg Oral BID  . mouth rinse  15 mL Mouth Rinse q12n4p  . metoprolol  50 mg Oral BID  . mycophenolate  500 mg Oral BID  . norethindrone  1 tablet Oral Daily  . omega-3 acid ethyl esters  1 g Oral BID  . pravastatin  40 mg Oral QHS  . predniSONE  2.5 mg Oral Q breakfast  . sertraline  100 mg Oral BID  . sodium chloride flush  3 mL  Intravenous Q12H  . Teriparatide (Recombinant)  20 mcg Subcutaneous Daily    have reviewed scheduled and prn medications.  Physical Exam: General: sleepy- head and neck swelling  Heart: RRR- borderline tachy Lungs: poor effort Abdomen: obese, soft Extremities: minimal edema    07/13/2016,8:09 AM  LOS: 2 days

## 2016-07-13 NOTE — Procedures (Signed)
Procedure: LP with Fluoro guidance at L2-3. Specimen: CSF, to lab Bleeding: minimal. Complications: None immediate. Patient   -Condition: Stable.  -Disposition:  Return to inpt floor in stable condition.  Full Radiology Report to follow under IMAGING

## 2016-07-13 NOTE — Progress Notes (Signed)
  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  Per her mom, this is the ONLY venous access she has.  She does NOT wish to proceed with removal of Port today and states she will discuss this with other MDs involved.  Aki Burdin S Breaker Springer PA-C 07/13/2016 10:08 AM

## 2016-07-13 NOTE — Progress Notes (Signed)
SLP Cancellation Note  Patient Details Name: Donna Shannon MRN: [id number] DOB: 03/26/1986   Cancelled treatment:       Reason Eval/Treat Not Completed: Other (comment) RN reports good tolerance of PO meds taken with thin liquids. MD says pt can now have diet without SLP evaluation. Will defer eval for now. Please reorder if needed.   Maxcine Hamaiewonsky, Lajean Boese 07/13/2016, 11:01 AM  Maxcine HamLaura Paiewonsky, M.A. CCC-SLP [phone]

## 2016-07-13 NOTE — Progress Notes (Signed)
[facility] PROGRESS NOTE  Donna Shannon ZOX:[id number] DOB: [birth year] DOA: 07/11/2016   PCP: No PCP Per Patient     Assessment/Plan: Principal Problem:   Intractable chronic migraine without aura and with status migrainosus Active Problems:   Renal transplant recipient   Depression   Essential hypertension   IIH (idiopathic intracranial hypertension)   HA (headache)   Chronic renal disease, stage III   Chronic diastolic heart failure (HCC)   OSA (obstructive sleep apnea)   Headache   Intractable headache   Chronic intractable headache   Acute on chronic diastolic heart failure (HCC)    31 y.o. female with medical history significant for obesity, hypertension, chronic migraine, idiopathic intracranial hypertension, depression, anxiety, neurogenic bladder, and chronic kidney disease stage III involving her transplant kidney, now presenting to the emergency department at the direction of her neurologist for evaluation of intractable headaches with blurred vision and nausea/vomiting.Neurologist was also consulted by the ED physician and has evaluated the patient in the emergency department. A medical admission is been advised for further evaluation and management of intractable headache with history of idiopathic intracranial hypertension.   Assessment and plan 1. Intractable headache  - Pt presents with her typical headache, but has been progressively worsening over the past month  - Head CT reveals normal brain and no focal neurologic deficits are appreciated on exam  - She has hx of idiopathic intracranial hypertension and reports temporary relief of these same sxs in October 2017 with LP  Neurology recommending lumbar puncture-check opening pressure, apparently has a diagnosis of pseudotumor cerebri after an LP revealed elevated opening pressure  During her prior admission, her headache was also addressed and diagnostic lumbar puncture was performed with reported  opening pressure of 27, decreasing to 13 after removal of 20 cc CSF. Continue Diamox 250 mg BID at that time Also concerned about SVC syndrome-not a candidate for venography  given worsening renal function IR consult to see if it would benefit to remove Port-A-Cath Venous Doppler of bilateral upper extremities to rule out upper extremity DVT   2. Acute on chronic diastolic CHF  - Pt appears to have excess volume on admission  - TTE (04/28/16) with EF 60-65%, grade 2 diastolic dysfunction, mild LVH, and mild TR  - She is managed at home with Lasix 40 mg qD, metoprolol, and Diamox  Diuretics being titrated by nephrology,    3. CKD stage III; s/p renal transplantation - SCr is 2.8  ; baseline appears to be ~2  - She is on her second related living donor transplanted kidney from related living donor, this one placed in late 1990's  - Continue anti-rejection medications  - Nephrology managing diuretics Creatinine increasing could be due to bactrim versus UTI versus just clinical scenario  4. Hypertension  - BP at goal on admission  - Continue current management with Lopressor and Lasix as tolerated   5. OSA - Continue CPAP qHS Now requiring BiPAP support   6. Anxiety, insomnia , acute toxic encephalopathy On multiple sedating medications Patient received Phenergan for headache last night In addition she is on Klonopin, Ambien, Dilaudid Dose of Klonopin has been reduced Received 2 doses of narcan and one dose of flumazenil 07/12/16 around 11 AM Transferred to stepdown on BiPAP due to elevated CO2, pH of 7.25   7. 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 Venous doppler to assess for evidence of UE DVT Invasive contrast venography is the most conclusive diagnostic tool , will discuss options  with IR in am  CT chest abdomen pelvis to rule out mediastinal/retroperitoneal fibrosis  8. Hypercapnic/hypoxic respiratory failure requiring BiPAP support Patient's low PH could be secondary to Diamox,  discussed with nephrology Patient needs to be treated primarily based on respiratory symptoms Do not suspect pneumonia  DVT prophylaxsis heparin  Code Status:  Full code    Family Communication: Discussed in detail with the patient, all imaging results, lab results explained to the patient   Disposition Plan:   Continue stepdown      Consultants:  Nephrology  Neurology    Procedures:  None  Antibiotics: Anti-infectives    Start     Dose/Rate Route Frequency Ordered Stop   07/12/16 1400  cefTRIAXone (ROCEPHIN) 1 g in dextrose 5 % 50 mL IVPB    Comments:  Using sensitivities from OP spec- E coli   1 g 100 mL/hr over 30 Minutes Intravenous Every 24 hours 07/12/16 1358           HPI/Subjective:  Patient and mother declined removal of Port-A-Cath Mother quite tearful about ongoing plan of care Explained in detail about why Port-A-Cath removal was even considered Concern for SVC syndrome Also discussed with neurology by the bedside Patient is much more awake and alert today  Objective: Vitals:   07/13/16 0306 07/13/16 0448 07/13/16 0500 07/13/16 0722  BP: 119/81   105/77  Pulse: 86 85  90  Resp: 14 18  17   Temp: 97.5 F (36.4 C)   98.7 F (37.1 C)  TempSrc: Axillary   Oral  SpO2: 95% 100%  97%  Weight:   90.3 kg (199 lb 1.2 oz)   Height:        Intake/Output Summary (Last 24 hours) at 07/13/16 0834 Last data filed at 07/13/16 0626  Gross per 24 hour  Intake          1001.66 ml  Output             1500 ml  Net          -498.34 ml    Exam:  Examination:  General exam: Appears calm and comfortable  Respiratory system: Clear to auscultation. Respiratory effort normal. Cardiovascular system: S1 & S2 heard, RRR. No JVD, murmurs, rubs, gallops or clicks. No pedal edema. Gastrointestinal system: Abdomen is nondistended, soft and nontender. No organomegaly or masses felt. Normal bowel sounds heard. Central nervous system: Alert and oriented. No focal  neurological deficits. Extremities: Symmetric 5 x 5 power. Skin: No rashes, lesions or ulcers Psychiatry: Judgement and insight appear normal. Mood & affect appropriate.     Data Reviewed: I have personally reviewed following labs and imaging studies  Micro Results Recent Results (from the past 240 hour(s))  MRSA PCR Screening     Status: None   Collection Time: 07/12/16 12:05 PM  Result Value Ref Range Status   MRSA by PCR NEGATIVE NEGATIVE Final    Comment:        The GeneXpert MRSA Assay (FDA approved for NASAL specimens only), is one component of a comprehensive MRSA colonization surveillance program. It is not intended to diagnose MRSA infection nor to guide or monitor treatment for MRSA infections.     Radiology Reports Ct Head Wo Contrast  Result Date: 07/11/2016 CLINICAL DATA:  Headache for 3 months. EXAM: CT HEAD WITHOUT CONTRAST TECHNIQUE: Contiguous axial images were obtained from the base of the skull through the vertex without intravenous contrast. COMPARISON:  04/29/2016 FINDINGS: Brain: There is no intracranial hemorrhage,  mass or evidence of acute infarction. There is no extra-axial fluid collection. Gray matter and white matter appear normal. Cerebral volume is normal for age. Brainstem and posterior fossa are unremarkable. The CSF spaces appear normal. Vascular: No hyperdense vessel or unexpected calcification. Skull: Normal. Negative for fracture or focal lesion. Sinuses/Orbits: No acute finding. Other: None. IMPRESSION: Normal brain Electronically Signed   By: Ellery Plunk M.D.   On: 07/11/2016 21:06   Mr Brain Wo Contrast  Result Date: 07/06/2016 CLINICAL DATA:  Severe head pressure and blurred vision. Patient has neuro stimulator. Limited SAR study. EXAM: MRI HEAD WITHOUT CONTRAST TECHNIQUE: Multiplanar, multiecho pulse sequences of the brain and surrounding structures were obtained without intravenous contrast. COMPARISON:  Head CT 04/29/2016 FINDINGS:  Brain: The brain has normal appearance without evidence of malformation, atrophy, old or acute small or large vessel infarction, hemorrhage, hydrocephalus or extra-axial collection. No pituitary abnormality. Vascular: Major vessels at the base of the brain show flow. Skull and upper cervical spine: Normal Sinuses/Orbits: Clear/ normal. Other: None significant. IMPRESSION: Normal brain MRI. No cause of the presenting symptoms is identified. Electronically Signed   By: Paulina Fusi M.D.   On: 07/06/2016 19:24   Dg Chest Port 1 View  Result Date: 07/11/2016 CLINICAL DATA:  Edema and migraine headache.  Renal failure. EXAM: PORTABLE CHEST 1 VIEW COMPARISON:  Two-view chest x-ray 04/28/2016 FINDINGS: A right IJ double-lumen Port-A-Cath extends into the right atrium. Heart is enlarged. Mild edema is present. No definite lesions are present. Mild bibasilar airspace disease likely reflects atelectasis. IMPRESSION: 1. Cardiomegaly with mild interstitial edema. 2. No focal airspace disease. 3. Stable right IJ Port-A-Cath. Electronically Signed   By: Marin Roberts M.D.   On: 07/11/2016 20:15   Mr Mrv Head Wo Cm  Result Date: 07/06/2016 CLINICAL DATA:  Severe head pressure and blurred vision. Patient has neuro stimulator. Limited SAR study EXAM: MR VENOGRAM the HEAD WITHOUT CONTRAST TECHNIQUE: Angiographic images of the intracranial venous structures were obtained using MRV technique without intravenous contrast. COMPARISON:  Head study same day FINDINGS: Superior sagittal sinus is patent. Transverse and sigmoid sinuses are patent. Deep venous system and straight sinus are patent. No thrombosed cortical veins identified. IMPRESSION: Normal intracranial MR venography. Electronically Signed   By: Paulina Fusi M.D.   On: 07/06/2016 19:25   Mr Birdie Hopes WU Contrast  Result Date: 07/06/2016 CLINICAL DATA:  Severe head pressure and blurred vision. Patient has neuro stimulator. Limited SAR study EXAM: MRI OF THE  ORBITS WITHOUT CONTRAST TECHNIQUE: Multiplanar, multisequence MR imaging of the orbits was performed. No intravenous contrast was administered. COMPARISON:  MRI and MRV same day FINDINGS: Orbits: Both globes appear normal. Optic nerves and nerve sheaths appear normal. Extra-ocular muscles are normal. Intraorbital fat is normal. Lacrimal glands are normal. Orbital apices are normal. Cavernous sinuses are normal. Visualized sinuses: Clear and normal Soft tissues: No significant finding Limited intracranial: Negative as on the full study. IMPRESSION: Normal orbital exam. Electronically Signed   By: Paulina Fusi M.D.   On: 07/06/2016 19:27     CBC  Recent Labs Lab 07/11/16 1846 07/11/16 1913 07/13/16 0553  WBC 6.3  --  5.5  HGB 11.5* 11.6* 11.8*  HCT 36.2 34.0* 37.7  PLT 196  --  189  MCV 95.3  --  95.2  MCH 30.3  --  29.8  MCHC 31.8  --  31.3  RDW 14.1  --  14.0  LYMPHSABS 2.5  --   --   MONOABS  0.4  --   --   EOSABS 0.1  --   --   BASOSABS 0.0  --   --     Chemistries   Recent Labs Lab 07/11/16 1846 07/11/16 1913 07/12/16 0704 07/13/16 0553  NA 139 140 140 142  K 3.4* 3.5 4.5 3.9  CL 101 104 102 103  CO2 26  --  28 27  GLUCOSE 117* 120* 111* 108*  BUN 39* 41* 36* 41*  CREATININE 2.28* 2.40* 2.27* 2.84*  CALCIUM 9.5  --  9.4 9.4  MG  --   --  2.6*  --   AST 24  --   --   --   ALT 16  --   --   --   ALKPHOS 36*  --   --   --   BILITOT 1.0  --   --   --    ------------------------------------------------------------------------------------------------------------------ estimated creatinine clearance is 29.7 mL/min (by C-G formula based on SCr of 2.84 mg/dL (H)). ------------------------------------------------------------------------------------------------------------------ No results for input(s): HGBA1C in the last 72 hours. ------------------------------------------------------------------------------------------------------------------ No results for input(s): CHOL,  HDL, LDLCALC, TRIG, CHOLHDL, LDLDIRECT in the last 72 hours. ------------------------------------------------------------------------------------------------------------------ No results for input(s): TSH, T4TOTAL, T3FREE, THYROIDAB in the last 72 hours.  Invalid input(s): FREET3 ------------------------------------------------------------------------------------------------------------------ No results for input(s): VITAMINB12, FOLATE, FERRITIN, TIBC, IRON, RETICCTPCT in the last 72 hours.  Coagulation profile No results for input(s): INR, PROTIME in the last 168 hours.  No results for input(s): DDIMER in the last 72 hours.  Cardiac Enzymes No results for input(s): CKMB, TROPONINI, MYOGLOBIN in the last 168 hours.  Invalid input(s): CK ------------------------------------------------------------------------------------------------------------------ Invalid input(s): POCBNP   CBG:  Recent Labs Lab 07/12/16 0928  GLUCAP 102*       Studies: Ct Head Wo Contrast  Result Date: 07/11/2016 CLINICAL DATA:  Headache for 3 months. EXAM: CT HEAD WITHOUT CONTRAST TECHNIQUE: Contiguous axial images were obtained from the base of the skull through the vertex without intravenous contrast. COMPARISON:  04/29/2016 FINDINGS: Brain: There is no intracranial hemorrhage, mass or evidence of acute infarction. There is no extra-axial fluid collection. Gray matter and white matter appear normal. Cerebral volume is normal for age. Brainstem and posterior fossa are unremarkable. The CSF spaces appear normal. Vascular: No hyperdense vessel or unexpected calcification. Skull: Normal. Negative for fracture or focal lesion. Sinuses/Orbits: No acute finding. Other: None. IMPRESSION: Normal brain Electronically Signed   By: Ellery Plunk M.D.   On: 07/11/2016 21:06   Dg Chest Port 1 View  Result Date: 07/11/2016 CLINICAL DATA:  Edema and migraine headache.  Renal failure. EXAM: PORTABLE CHEST 1 VIEW  COMPARISON:  Two-view chest x-ray 04/28/2016 FINDINGS: A right IJ double-lumen Port-A-Cath extends into the right atrium. Heart is enlarged. Mild edema is present. No definite lesions are present. Mild bibasilar airspace disease likely reflects atelectasis. IMPRESSION: 1. Cardiomegaly with mild interstitial edema. 2. No focal airspace disease. 3. Stable right IJ Port-A-Cath. Electronically Signed   By: Marin Roberts M.D.   On: 07/11/2016 20:15      No results found for: HGBA1C Lab Results  Component Value Date   CREATININE 2.84 (H) 07/13/2016       Scheduled Meds: . acetaZOLAMIDE  500 mg Oral BID  . allopurinol  100 mg Oral BID  . aspirin  81 mg Oral Daily  . cefTRIAXone (ROCEPHIN)  IV  1 g Intravenous Q24H  . chlorhexidine  15 mL Mouth Rinse BID  . cholecalciferol  1,000 Units Oral Daily  .  clonazePAM  0.5 mg Oral QHS  . cycloSPORINE modified  125 mg Oral BID  . famotidine (PEPCID) IV  20 mg Intravenous Q12H  . flavoxATE  100 mg Oral TID  . furosemide  40 mg Intravenous Q12H  . gemfibrozil  600 mg Oral Daily  . Icosapent Ethyl  1 capsule Oral BID  . levETIRAcetam  500 mg Oral BID  . magnesium oxide  800 mg Oral BID  . mouth rinse  15 mL Mouth Rinse q12n4p  . metoprolol  50 mg Oral BID  . mycophenolate  500 mg Oral BID  . norethindrone  1 tablet Oral Daily  . omega-3 acid ethyl esters  1 g Oral BID  . pravastatin  40 mg Oral QHS  . predniSONE  2.5 mg Oral Q breakfast  . sertraline  100 mg Oral BID  . sodium chloride flush  3 mL Intravenous Q12H  . Teriparatide (Recombinant)  20 mcg Subcutaneous Daily   Continuous Infusions:   LOS: 2 days    Time spent: >30 MINS    Surgcenter Pinellas LLCBROL,Kolsen Choe  [facility] Pager [phone]. If 7PM-7AM, please contact night-coverage at [email or web address], password [email or web address] 07/13/2016, 8:34 AM  LOS: 2 days

## 2016-07-14 LAB — COMPREHENSIVE METABOLIC PANEL
ALK PHOS: 33 U/L — AB (ref 38–126)
ALT: 13 U/L — ABNORMAL LOW (ref 14–54)
ANION GAP: 14 (ref 5–15)
AST: 23 U/L (ref 15–41)
Albumin: 4.1 g/dL (ref 3.5–5.0)
BUN: 47 mg/dL — ABNORMAL HIGH (ref 6–20)
CALCIUM: 9.6 mg/dL (ref 8.9–10.3)
CO2: 25 mmol/L (ref 22–32)
Chloride: 100 mmol/L — ABNORMAL LOW (ref 101–111)
Creatinine, Ser: 2.76 mg/dL — ABNORMAL HIGH (ref 0.44–1.00)
GFR calc non Af Amer: 22 mL/min — ABNORMAL LOW (ref >60)
GFR, EST AFRICAN AMERICAN: 25 mL/min — AB (ref >60)
Glucose, Bld: 96 mg/dL (ref 65–99)
POTASSIUM: 3.9 mmol/L (ref 3.5–5.1)
Sodium: 139 mmol/L (ref 135–145)
TOTAL PROTEIN: 7.2 g/dL (ref 6.5–8.1)
Total Bilirubin: 0.8 mg/dL (ref 0.3–1.2)

## 2016-07-14 MED ORDER — FUROSEMIDE 40 MG PO TABS
40.0000 mg | ORAL_TABLET | Freq: Two times a day (BID) | ORAL | Status: DC
  Administered 2016-07-14 – 2016-07-15 (×2): 40 mg via ORAL
  Filled 2016-07-14 (×2): qty 1

## 2016-07-14 NOTE — Progress Notes (Signed)
Subjective:  No UOP recorded- creatinine stable.  Dopplers of UE vessels not showing evidence of clot- CT not showing obvious dilation of vessels.  She says that she does not have headache and her vision is better ! She wants to go home   Objective Vital signs in last 24 hours: Vitals:   07/13/16 2318 07/14/16 0334 07/14/16 0500 07/14/16 0700  BP: 110/79 100/76  96/66  Pulse: 85 83  89  Resp: 14 13  19   Temp: 98.2 F (36.8 C) 98.1 F (36.7 C)  98 F (36.7 C)  TempSrc: Oral Oral  Oral  SpO2: 95% 96%  94%  Weight:   90 kg (198 lb 6.6 oz)   Height:       Weight change: -0.5 kg (-1 lb 1.6 oz) No intake or output data in the 24 hours ending 07/14/16 0830  Assessment/Plan: 31 year old white female status post her second kidney transplant in 1998 on cyclosporine, mycophenolate and prednisone who is had an issue of late with migraines and possible pseudotumor cerebri 1.Renal- status post second renal transplant in 1998. Baseline creatinine was around 2. She recently had an Escherichia coli urinary tract infection and was placed on Bactrim. Creatinine was 2.8 which could be due to bactrim versus UTI versus just clinical scenario- is 2.7 today. She was making good urine- not recorded last 24 hours. Objectively even though on lasix does not appear she has diuresed that effectively. Will change her back to her OP regimen of lasix 40 PO BID 2. Neuro  - this seems to be her biggest issue right now. Mother feels Jadelin has changed neurologically and it's worse. She's always been on a lot of these comfort and sedating medications that I tried to decrease but the patient has  not been willing. Has improved from yesterday to today !  Only concrete thing done was LP ? Now concern over SVC syndrome causing this whole clinical picture but dopplers and scans not showing (although not sure if they would) Since is better may be able to table removal of portacath but concerned she will have recurrence ?? 3.  Volume- as above are not sure she's total body volume overloaded. I actually wonder about SVC syndrome given that most of her fluid seems to be in her head and neck. She has a Port-A-Cath in place so unfortunately I think that would be difficult to remedy- place back on OP lasix dosing  4. Anemia  - is not a significant issue at this time 5. UTI- she had as outpatient it was Escherichia coli sensitive to ceftriaxone -  Treat her with that for now 6. Status post renal transplant- continue her regimen of cyclosporine 125 twice a day, mycophenolate 500 twice a day, prednisone 2.5 mg daily- creatinine heading in right direction 7. Not sure what end point is    Mckinley Adelstein A    Labs: Basic Metabolic Panel:  Recent Labs Lab 07/12/16 0704 07/13/16 0553 07/14/16 0720  NA 140 142 139  K 4.5 3.9 3.9  CL 102 103 100*  CO2 28 27 25   GLUCOSE 111* 108* 96  BUN 36* 41* 47*  CREATININE 2.27* 2.84* 2.76*  CALCIUM 9.4 9.4 9.6   Liver Function Tests:  Recent Labs Lab 07/11/16 1846 07/14/16 0720  AST 24 23  ALT 16 13*  ALKPHOS 36* 33*  BILITOT 1.0 0.8  PROT 7.1 7.2  ALBUMIN 4.0 4.1   No results for input(s): LIPASE, AMYLASE in the last 168 hours. No results  for input(s): AMMONIA in the last 168 hours. CBC:  Recent Labs Lab 07/11/16 1846 07/11/16 1913 07/13/16 0553  WBC 6.3  --  5.5  NEUTROABS 3.2  --   --   HGB 11.5* 11.6* 11.8*  HCT 36.2 34.0* 37.7  MCV 95.3  --  95.2  PLT 196  --  189   Cardiac Enzymes: No results for input(s): CKTOTAL, CKMB, CKMBINDEX, TROPONINI in the last 168 hours. CBG:  Recent Labs Lab 07/12/16 0928 07/13/16 2140  GLUCAP 102* 313*    Iron Studies: No results for input(s): IRON, TIBC, TRANSFERRIN, FERRITIN in the last 72 hours. Studies/Results: Ct Abdomen Pelvis Wo Contrast  Result Date: 07/13/2016 CLINICAL DATA:  Mediastinal fibrosis. History of renal transplant, right oophorectomy, retroperitoneal fibrosis, hernia repair and  Port-A-Cath placement. Neurogenic bladder with frequent urinary tract infections. EXAM: CT CHEST, ABDOMEN AND PELVIS WITHOUT CONTRAST TECHNIQUE: Multidetector CT imaging of the chest, abdomen and pelvis was performed following the standard protocol without IV contrast. COMPARISON:  CXR from 07/11/2016 fell the left CT abdomen 04/28/2016 FINDINGS: CT CHEST FINDINGS Cardiovascular: Top normal size cardiac chambers without pericardial effusion. No thoracic aortic aneurysm. Main pulmonary artery caliber is top-normal at approximately 2.9 cm. Port catheter noted in the right atrium with motion artifacts from the tip projecting into the right ventricle. Mediastinum/Nodes: No mediastinal adenopathy. No thyromegaly or discrete mass. The trachea and mainstem bronchi are patent. No esophageal abnormality by CT. Small hiatal hernia. Lungs/Pleura: Bibasilar dependent atelectasis. Trace pleural effusions. Mild lower lobe bronchiectasis bilaterally. No pneumonic consolidations. No dominant mass or pneumothorax. Musculoskeletal: Nonacute CT ABDOMEN PELVIS FINDINGS Hepatobiliary: No focal liver abnormality is seen. No gallstones, gallbladder wall thickening, or biliary dilatation. Probable minimal sludge along the dependent aspect of the gallbladder. Pancreas: Unremarkable. No pancreatic ductal dilatation or surrounding inflammatory changes. Spleen: Normal in size without focal abnormality. Adrenals/Urinary Tract: No acute abnormality of the adrenal glands. Status post right nephrectomy with transplanted left kidney. No obstruction of the transplanted kidney or nephrolithiasis. Neural stimulator noted overlying the left gluteal muscle with leads projecting into the right hemipelvis. Stomach/Bowel: Contracted stomach. Normal small bowel rotation. No bowel obstruction or acute inflammation. Mild submucosal fat deposition along the right colon. Vascular/Lymphatic: No significant vascular findings are present. No enlarged abdominal or  pelvic lymph nodes. No aortic aneurysm. Reproductive: The uterus is unremarkable. The patient is status post right oophorectomy. No left adnexal mass. Other: Postoperative scarring in the right lower quadrant. Retroperitoneal surgical clips as well as right lower quadrant surgical clips presumably from prior nephrectomy. Subcutaneous left lower quadrant fatty induration likely related to injection of subcutaneous medication. Musculoskeletal: Nonacute IMPRESSION: 1. Trace pleural effusions. 2. Port catheter tip noted into the right atrium. Cardiac pulsations may occasionally place tip across the tricuspid into right ventricle. 3. Status post right nephrectomy with transplanted left kidney in the left lower quadrant. No obstructive uropathy. Neurostimulator device for neurogenic bladder noted. 4. Probable mild layering sludge along the dependent wall of the gallbladder. Electronically Signed   By: Tollie Ethavid  Kwon M.D.   On: 07/13/2016 22:42   Ct Chest Wo Contrast  Result Date: 07/13/2016 CLINICAL DATA:  Mediastinal fibrosis. History of renal transplant, right oophorectomy, retroperitoneal fibrosis, hernia repair and Port-A-Cath placement. Neurogenic bladder with frequent urinary tract infections. EXAM: CT CHEST, ABDOMEN AND PELVIS WITHOUT CONTRAST TECHNIQUE: Multidetector CT imaging of the chest, abdomen and pelvis was performed following the standard protocol without IV contrast. COMPARISON:  CXR from 07/11/2016 fell the left CT abdomen 04/28/2016 FINDINGS:  CT CHEST FINDINGS Cardiovascular: Top normal size cardiac chambers without pericardial effusion. No thoracic aortic aneurysm. Main pulmonary artery caliber is top-normal at approximately 2.9 cm. Port catheter noted in the right atrium with motion artifacts from the tip projecting into the right ventricle. Mediastinum/Nodes: No mediastinal adenopathy. No thyromegaly or discrete mass. The trachea and mainstem bronchi are patent. No esophageal abnormality by CT. Small  hiatal hernia. Lungs/Pleura: Bibasilar dependent atelectasis. Trace pleural effusions. Mild lower lobe bronchiectasis bilaterally. No pneumonic consolidations. No dominant mass or pneumothorax. Musculoskeletal: Nonacute CT ABDOMEN PELVIS FINDINGS Hepatobiliary: No focal liver abnormality is seen. No gallstones, gallbladder wall thickening, or biliary dilatation. Probable minimal sludge along the dependent aspect of the gallbladder. Pancreas: Unremarkable. No pancreatic ductal dilatation or surrounding inflammatory changes. Spleen: Normal in size without focal abnormality. Adrenals/Urinary Tract: No acute abnormality of the adrenal glands. Status post right nephrectomy with transplanted left kidney. No obstruction of the transplanted kidney or nephrolithiasis. Neural stimulator noted overlying the left gluteal muscle with leads projecting into the right hemipelvis. Stomach/Bowel: Contracted stomach. Normal small bowel rotation. No bowel obstruction or acute inflammation. Mild submucosal fat deposition along the right colon. Vascular/Lymphatic: No significant vascular findings are present. No enlarged abdominal or pelvic lymph nodes. No aortic aneurysm. Reproductive: The uterus is unremarkable. The patient is status post right oophorectomy. No left adnexal mass. Other: Postoperative scarring in the right lower quadrant. Retroperitoneal surgical clips as well as right lower quadrant surgical clips presumably from prior nephrectomy. Subcutaneous left lower quadrant fatty induration likely related to injection of subcutaneous medication. Musculoskeletal: Nonacute IMPRESSION: 1. Trace pleural effusions. 2. Port catheter tip noted into the right atrium. Cardiac pulsations may occasionally place tip across the tricuspid into right ventricle. 3. Status post right nephrectomy with transplanted left kidney in the left lower quadrant. No obstructive uropathy. Neurostimulator device for neurogenic bladder noted. 4. Probable mild  layering sludge along the dependent wall of the gallbladder. Electronically Signed   By: Tollie Eth M.D.   On: 07/13/2016 22:42   Dg Fluoro Guide Lumbar Puncture  Result Date: 07/13/2016 CLINICAL DATA:  31 year old female with headache and history of idiopathic intracranial hypertension (pseudotumor cerebri). Diagnostic and therapeutic lumbar puncture under fluoroscopic guidance requested. Initial encounter. EXAM: DIAGNOSTIC LUMBAR PUNCTURE UNDER FLUOROSCOPIC GUIDANCE FLUOROSCOPY TIME:  Fluoroscopy Time:  0 minutes 18 seconds Radiation Exposure Index (if provided by the fluoroscopic device): Number of Acquired Spot Images: 0 PROCEDURE: Informed consent was obtained from the patient prior to the procedure, including potential complications of headache, allergy, and pain. A "time-out" was performed. With the patient prone, the lower back was prepped with Betadine. 1% Lidocaine was used for local anesthesia. Lumbar puncture was performed at the L2-L3 level using a left sub laminar approach, a 3.5 inch x 20 gauge needle with return of clear, colorless CSF with an opening pressure of 22.5 cm water. 16 ml of CSF were obtained for laboratory studies. The needle was withdrawn, direct pressure was held, and hemostasis was noted. The patient tolerated the procedure well and there were no apparent complications. Appropriate post procedural orders were placed on the chart. The patient was returned to the inpatient floor in stable condition for continued treatment. IMPRESSION: Fluoroscopic guided lumbar puncture at L2-L3. Opening pressure of 22.5 cm of water. 16 mL of CSF obtained and sent to the lab for analysis. Electronically Signed   By: Odessa Fleming M.D.   On: 07/13/2016 11:52   Medications: Infusions:   Scheduled Medications: . acetaZOLAMIDE  500 mg  Oral BID  . allopurinol  100 mg Oral BID  . aspirin  81 mg Oral Daily  . cefTRIAXone (ROCEPHIN)  IV  1 g Intravenous Q24H  . chlorhexidine  15 mL Mouth Rinse BID  .  cholecalciferol  1,000 Units Oral Daily  . clonazePAM  0.5 mg Oral QHS  . cycloSPORINE modified  125 mg Oral BID  . famotidine (PEPCID) IV  20 mg Intravenous Q24H  . flavoxATE  100 mg Oral TID  . furosemide  40 mg Intravenous Q12H  . gemfibrozil  600 mg Oral Daily  . levETIRAcetam  500 mg Oral BID  . magnesium oxide  800 mg Oral BID  . mouth rinse  15 mL Mouth Rinse q12n4p  . metoprolol  50 mg Oral BID  . mycophenolate  500 mg Oral BID  . omega-3 acid ethyl esters  1 g Oral BID  . pravastatin  40 mg Oral QHS  . predniSONE  2.5 mg Oral Q breakfast  . sertraline  100 mg Oral BID  . sodium chloride flush  3 mL Intravenous Q12H    have reviewed scheduled and prn medications.  Physical Exam: General: sleepy- but does report that she feels better- no headache and vision is improved  Heart: RRR-  Lungs: poor effort Abdomen: obese, soft Extremities: minimal edema    07/14/2016,8:30 AM  LOS: 3 days

## 2016-07-14 NOTE — Progress Notes (Addendum)
Subjective: I discussed with her primary neurologist, she has a decreased acuity, but does not have any restriction her visual fields which is unusual for pseudotumor related visual changes.  She feels much better today.  Exam: Vitals:   07/14/16 0700 07/14/16 1149  BP: 96/66 126/86  Pulse: 89 96  Resp: 19 14  Temp: 98 F (36.7 C) 98.1 F (36.7 C)   Gen: In bed, NAD Resp: non-labored breathing, no acute distress Abd: soft, nt  Neuro: MS: awake, alert, oriented ZO:XWRUECN:PERRL, EOMI Motor: MAEW  Sensory: intact to LT  Pertinent Labs: OP 22   Impression: 31 yo F with elevated ICPs and papilledema. 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 I have recommended further workup for this, but her renal dysfunction is Impaired and she is already s/p two kidney transplants.   This would be unifying diagnosis, however, without restriction  In visual fields and with an ICP that is not very elevated I'm less concerned about an urgent acute remedy to the situation.  Recommendations: 1) continue diamox 2) if she continues to have symptoms of SVC syndrome, consider MRV(without contast) 3) with symptomatic improvement and no further evaluation or management plan for her SVC syndrome, I'm not sure that neurology has much to continue to offer her from the inpatient side. She should follow up as an outpatient with her primary neurologist Dr. Lucia GaskinsAhern. 4) neurology will sign off, please call further questions or concerns.  Ritta SlotMcNeill Kirkpatrick, MD [facility] [phone]  If 7pm- 7am, please page neurology on call as listed in AMION.

## 2016-07-14 NOTE — Progress Notes (Signed)
CBG on 1/5 at 2140 does not belong to this patient. It belongs to Donna Shannon.

## 2016-07-14 NOTE — Progress Notes (Signed)
[facility] PROGRESS NOTE  Crista Nuon ZOX:[id number] DOB: Aug 16, 1985 DOA: 07/11/2016   PCP: No PCP Per Patient     Assessment/Plan: Principal Problem:   Intractable chronic migraine without aura and with status migrainosus Active Problems:   Renal transplant recipient   Depression   Essential hypertension   Intracranial hypertension   HA (headache)   Chronic renal disease, stage III   Chronic diastolic heart failure (HCC)   OSA (obstructive sleep apnea)   Headache   Intractable headache   Chronic intractable headache   Acute on chronic diastolic heart failure (HCC)    31 y.o. female with medical history significant for obesity, hypertension, chronic migraine, idiopathic intracranial hypertension, depression, anxiety, neurogenic bladder, and chronic kidney disease stage III involving her transplant kidney, now presenting to the emergency department at the direction of her neurologist for evaluation of intractable headaches with blurred vision and nausea/vomiting.Neurologist was also consulted by the ED physician and has evaluated the patient in the emergency department. A medical admission is been advised for further evaluation and management of intractable headache with history of idiopathic intracranial hypertension.   Assessment and plan 1. Intractable headache  - Pt presents with her typical headache, but has been progressively worsening over the past month  - Head CT reveals normal brain and no focal neurologic deficits are appreciated on exam  - She has hx of idiopathic intracranial hypertension and reports temporary relief of these same sxs in October 2017 with LP  Neurology following-status post LP, opening pressure not very high , opening pressure 22   apparently has a diagnosis of pseudotumor cerebri . Had some post LP headache  During her prior admission, her headache was also addressed and diagnostic lumbar puncture was performed with reported opening  pressure of 27, decreasing to 13 after removal of 20 cc CSF. Currently on Diamox 250 mg BID at that time Also concerned about SVC syndrome-not a candidate for venography  given worsening renal function Patient has declined Port-A-Cath removal-neurology continues to encourage this is a viable treatment option Venous Doppler of bilateral upper extremities negative   2. Acute on chronic diastolic CHF  - Pt appears to have excess volume on admission  - TTE (04/28/16) with EF 60-65%, grade 2 diastolic dysfunction, mild LVH, and mild TR  - She is managed with Lasix 40 mg IV, metoprolol, and Diamox  Diuretics being titrated by nephrology,   Creatinine 2.84>2.76  3. CKD stage III; s/p renal transplantation - SCr is 2.76  today ; baseline appears to be ~2  - She is on her second related living donor transplanted kidney from related living donor, this one placed in late 1990's  - Continue anti-rejection medications  - Nephrology managing diuretics Increase in Creatinine  could be due to bactrim versus UTI versus just clinical scenario  4. Hypertension  - BP at goal on admission  - Continue current management with Lopressor and Lasix as tolerated   5. OSA - Continue CPAP qHS Now requiring BiPAP support   6. Anxiety, insomnia , acute toxic encephalopathy  initially wasOn multiple sedating medication Phenergan, Klonopin, Ambien, Dilaudid Dose of Klonopin has been reduced Received 2 doses of narcan and one dose of flumazenil 07/12/16 around 11 AM Transferred to stepdown on BiPAP due to elevated CO2, pH of 7.25 Remained on BiPAP overnight   7. 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 Venous doppler to assess for evidence of UE DVT-negative  Invasive contrast venography is the most conclusive diagnostic tool ,  will discuss options with IR in am  CT chest abdomen pelvis to rule out mediastinal/retroperitoneal fibrosisComment negative   8. Hypercapnic/hypoxic respiratory failure requiring BiPAP  support Patient's low PH could be secondary to Diamox, discussed with nephrology Patient needs to be treated primarily based on respiratory symptoms Do not suspect pneumonia  DVT prophylaxsis heparin  Code Status:  Full code    Family Communication: Discussed in detail with the patient, all imaging results, lab results explained to the patient   Disposition Plan:   Continue stepdown      Consultants:  Nephrology  Neurology    Procedures:  None  Antibiotics: Anti-infectives    Start     Dose/Rate Route Frequency Ordered Stop   07/12/16 1400  cefTRIAXone (ROCEPHIN) 1 g in dextrose 5 % 50 mL IVPB    Comments:  Using sensitivities from OP spec- E coli   1 g 100 mL/hr over 30 Minutes Intravenous Every 24 hours 07/12/16 1358           HPI/Subjective: Feeling better , no HA, NO N   Objective: Vitals:   07/13/16 2318 07/14/16 0334 07/14/16 0500 07/14/16 0700  BP: 110/79 100/76  96/66  Pulse: 85 83  89  Resp: 14 13  19   Temp: 98.2 F (36.8 C) 98.1 F (36.7 C)  98 F (36.7 C)  TempSrc: Oral Oral  Oral  SpO2: 95% 96%  94%  Weight:   90 kg (198 lb 6.6 oz)   Height:       No intake or output data in the 24 hours ending 07/14/16 0818  Exam:  Examination:  General exam: Appears calm and comfortable  Respiratory system: Clear to auscultation. Respiratory effort normal. Cardiovascular system: S1 & S2 heard, RRR. No JVD, murmurs, rubs, gallops or clicks. No pedal edema. Gastrointestinal system: Abdomen is nondistended, soft and nontender. No organomegaly or masses felt. Normal bowel sounds heard. Central nervous system: Alert and oriented. No focal neurological deficits. Extremities: Symmetric 5 x 5 power. Skin: No rashes, lesions or ulcers Psychiatry: Judgement and insight appear normal. Mood & affect appropriate.     Data Reviewed: I have personally reviewed following labs and imaging studies  Micro Results Recent Results (from the past 240 hour(s))   MRSA PCR Screening     Status: None   Collection Time: 07/12/16 12:05 PM  Result Value Ref Range Status   MRSA by PCR NEGATIVE NEGATIVE Final    Comment:        The GeneXpert MRSA Assay (FDA approved for NASAL specimens only), is one component of a comprehensive MRSA colonization surveillance program. It is not intended to diagnose MRSA infection nor to guide or monitor treatment for MRSA infections.   Anaerobic culture     Status: None (Preliminary result)   Collection Time: 07/13/16  9:27 AM  Result Value Ref Range Status   Specimen Description CSF  Final   Special Requests NONE  Final   Culture PENDING  Incomplete   Report Status PENDING  Incomplete  CSF culture     Status: None (Preliminary result)   Collection Time: 07/13/16  9:27 AM  Result Value Ref Range Status   Specimen Description CSF  Final   Special Requests NONE  Final   Gram Stain   Final    WBC PRESENT, PREDOMINANTLY MONONUCLEAR NO ORGANISMS SEEN CYTOSPIN SMEAR    Culture NO GROWTH 1 DAY  Final   Report Status PENDING  Incomplete    Radiology Reports Ct Abdomen  Pelvis Wo Contrast  Result Date: 07/13/2016 CLINICAL DATA:  Mediastinal fibrosis. History of renal transplant, right oophorectomy, retroperitoneal fibrosis, hernia repair and Port-A-Cath placement. Neurogenic bladder with frequent urinary tract infections. EXAM: CT CHEST, ABDOMEN AND PELVIS WITHOUT CONTRAST TECHNIQUE: Multidetector CT imaging of the chest, abdomen and pelvis was performed following the standard protocol without IV contrast. COMPARISON:  CXR from 07/11/2016 fell the left CT abdomen 04/28/2016 FINDINGS: CT CHEST FINDINGS Cardiovascular: Top normal size cardiac chambers without pericardial effusion. No thoracic aortic aneurysm. Main pulmonary artery caliber is top-normal at approximately 2.9 cm. Port catheter noted in the right atrium with motion artifacts from the tip projecting into the right ventricle. Mediastinum/Nodes: No mediastinal  adenopathy. No thyromegaly or discrete mass. The trachea and mainstem bronchi are patent. No esophageal abnormality by CT. Small hiatal hernia. Lungs/Pleura: Bibasilar dependent atelectasis. Trace pleural effusions. Mild lower lobe bronchiectasis bilaterally. No pneumonic consolidations. No dominant mass or pneumothorax. Musculoskeletal: Nonacute CT ABDOMEN PELVIS FINDINGS Hepatobiliary: No focal liver abnormality is seen. No gallstones, gallbladder wall thickening, or biliary dilatation. Probable minimal sludge along the dependent aspect of the gallbladder. Pancreas: Unremarkable. No pancreatic ductal dilatation or surrounding inflammatory changes. Spleen: Normal in size without focal abnormality. Adrenals/Urinary Tract: No acute abnormality of the adrenal glands. Status post right nephrectomy with transplanted left kidney. No obstruction of the transplanted kidney or nephrolithiasis. Neural stimulator noted overlying the left gluteal muscle with leads projecting into the right hemipelvis. Stomach/Bowel: Contracted stomach. Normal small bowel rotation. No bowel obstruction or acute inflammation. Mild submucosal fat deposition along the right colon. Vascular/Lymphatic: No significant vascular findings are present. No enlarged abdominal or pelvic lymph nodes. No aortic aneurysm. Reproductive: The uterus is unremarkable. The patient is status post right oophorectomy. No left adnexal mass. Other: Postoperative scarring in the right lower quadrant. Retroperitoneal surgical clips as well as right lower quadrant surgical clips presumably from prior nephrectomy. Subcutaneous left lower quadrant fatty induration likely related to injection of subcutaneous medication. Musculoskeletal: Nonacute IMPRESSION: 1. Trace pleural effusions. 2. Port catheter tip noted into the right atrium. Cardiac pulsations may occasionally place tip across the tricuspid into right ventricle. 3. Status post right nephrectomy with transplanted left  kidney in the left lower quadrant. No obstructive uropathy. Neurostimulator device for neurogenic bladder noted. 4. Probable mild layering sludge along the dependent wall of the gallbladder. Electronically Signed   By: Tollie Eth M.D.   On: 07/13/2016 22:42   Ct Head Wo Contrast  Result Date: 07/11/2016 CLINICAL DATA:  Headache for 3 months. EXAM: CT HEAD WITHOUT CONTRAST TECHNIQUE: Contiguous axial images were obtained from the base of the skull through the vertex without intravenous contrast. COMPARISON:  04/29/2016 FINDINGS: Brain: There is no intracranial hemorrhage, mass or evidence of acute infarction. There is no extra-axial fluid collection. Gray matter and white matter appear normal. Cerebral volume is normal for age. Brainstem and posterior fossa are unremarkable. The CSF spaces appear normal. Vascular: No hyperdense vessel or unexpected calcification. Skull: Normal. Negative for fracture or focal lesion. Sinuses/Orbits: No acute finding. Other: None. IMPRESSION: Normal brain Electronically Signed   By: Ellery Plunk M.D.   On: 07/11/2016 21:06   Ct Chest Wo Contrast  Result Date: 07/13/2016 CLINICAL DATA:  Mediastinal fibrosis. History of renal transplant, right oophorectomy, retroperitoneal fibrosis, hernia repair and Port-A-Cath placement. Neurogenic bladder with frequent urinary tract infections. EXAM: CT CHEST, ABDOMEN AND PELVIS WITHOUT CONTRAST TECHNIQUE: Multidetector CT imaging of the chest, abdomen and pelvis was performed following the standard protocol without  IV contrast. COMPARISON:  CXR from 07/11/2016 fell the left CT abdomen 04/28/2016 FINDINGS: CT CHEST FINDINGS Cardiovascular: Top normal size cardiac chambers without pericardial effusion. No thoracic aortic aneurysm. Main pulmonary artery caliber is top-normal at approximately 2.9 cm. Port catheter noted in the right atrium with motion artifacts from the tip projecting into the right ventricle. Mediastinum/Nodes: No  mediastinal adenopathy. No thyromegaly or discrete mass. The trachea and mainstem bronchi are patent. No esophageal abnormality by CT. Small hiatal hernia. Lungs/Pleura: Bibasilar dependent atelectasis. Trace pleural effusions. Mild lower lobe bronchiectasis bilaterally. No pneumonic consolidations. No dominant mass or pneumothorax. Musculoskeletal: Nonacute CT ABDOMEN PELVIS FINDINGS Hepatobiliary: No focal liver abnormality is seen. No gallstones, gallbladder wall thickening, or biliary dilatation. Probable minimal sludge along the dependent aspect of the gallbladder. Pancreas: Unremarkable. No pancreatic ductal dilatation or surrounding inflammatory changes. Spleen: Normal in size without focal abnormality. Adrenals/Urinary Tract: No acute abnormality of the adrenal glands. Status post right nephrectomy with transplanted left kidney. No obstruction of the transplanted kidney or nephrolithiasis. Neural stimulator noted overlying the left gluteal muscle with leads projecting into the right hemipelvis. Stomach/Bowel: Contracted stomach. Normal small bowel rotation. No bowel obstruction or acute inflammation. Mild submucosal fat deposition along the right colon. Vascular/Lymphatic: No significant vascular findings are present. No enlarged abdominal or pelvic lymph nodes. No aortic aneurysm. Reproductive: The uterus is unremarkable. The patient is status post right oophorectomy. No left adnexal mass. Other: Postoperative scarring in the right lower quadrant. Retroperitoneal surgical clips as well as right lower quadrant surgical clips presumably from prior nephrectomy. Subcutaneous left lower quadrant fatty induration likely related to injection of subcutaneous medication. Musculoskeletal: Nonacute IMPRESSION: 1. Trace pleural effusions. 2. Port catheter tip noted into the right atrium. Cardiac pulsations may occasionally place tip across the tricuspid into right ventricle. 3. Status post right nephrectomy with  transplanted left kidney in the left lower quadrant. No obstructive uropathy. Neurostimulator device for neurogenic bladder noted. 4. Probable mild layering sludge along the dependent wall of the gallbladder. Electronically Signed   By: Tollie Eth M.D.   On: 07/13/2016 22:42   Mr Brain Wo Contrast  Result Date: 07/06/2016 CLINICAL DATA:  Severe head pressure and blurred vision. Patient has neuro stimulator. Limited SAR study. EXAM: MRI HEAD WITHOUT CONTRAST TECHNIQUE: Multiplanar, multiecho pulse sequences of the brain and surrounding structures were obtained without intravenous contrast. COMPARISON:  Head CT 04/29/2016 FINDINGS: Brain: The brain has normal appearance without evidence of malformation, atrophy, old or acute small or large vessel infarction, hemorrhage, hydrocephalus or extra-axial collection. No pituitary abnormality. Vascular: Major vessels at the base of the brain show flow. Skull and upper cervical spine: Normal Sinuses/Orbits: Clear/ normal. Other: None significant. IMPRESSION: Normal brain MRI. No cause of the presenting symptoms is identified. Electronically Signed   By: Paulina Fusi M.D.   On: 07/06/2016 19:24   Dg Chest Port 1 View  Result Date: 07/11/2016 CLINICAL DATA:  Edema and migraine headache.  Renal failure. EXAM: PORTABLE CHEST 1 VIEW COMPARISON:  Two-view chest x-ray 04/28/2016 FINDINGS: A right IJ double-lumen Port-A-Cath extends into the right atrium. Heart is enlarged. Mild edema is present. No definite lesions are present. Mild bibasilar airspace disease likely reflects atelectasis. IMPRESSION: 1. Cardiomegaly with mild interstitial edema. 2. No focal airspace disease. 3. Stable right IJ Port-A-Cath. Electronically Signed   By: Marin Roberts M.D.   On: 07/11/2016 20:15   Mr Mrv Head Wo Cm  Result Date: 07/06/2016 CLINICAL DATA:  Severe head pressure and blurred  vision. Patient has neuro stimulator. Limited SAR study EXAM: MR VENOGRAM the HEAD WITHOUT  CONTRAST TECHNIQUE: Angiographic images of the intracranial venous structures were obtained using MRV technique without intravenous contrast. COMPARISON:  Head study same day FINDINGS: Superior sagittal sinus is patent. Transverse and sigmoid sinuses are patent. Deep venous system and straight sinus are patent. No thrombosed cortical veins identified. IMPRESSION: Normal intracranial MR venography. Electronically Signed   By: Paulina Fusi M.D.   On: 07/06/2016 19:25   Mr Birdie Hopes WJ Contrast  Result Date: 07/06/2016 CLINICAL DATA:  Severe head pressure and blurred vision. Patient has neuro stimulator. Limited SAR study EXAM: MRI OF THE ORBITS WITHOUT CONTRAST TECHNIQUE: Multiplanar, multisequence MR imaging of the orbits was performed. No intravenous contrast was administered. COMPARISON:  MRI and MRV same day FINDINGS: Orbits: Both globes appear normal. Optic nerves and nerve sheaths appear normal. Extra-ocular muscles are normal. Intraorbital fat is normal. Lacrimal glands are normal. Orbital apices are normal. Cavernous sinuses are normal. Visualized sinuses: Clear and normal Soft tissues: No significant finding Limited intracranial: Negative as on the full study. IMPRESSION: Normal orbital exam. Electronically Signed   By: Paulina Fusi M.D.   On: 07/06/2016 19:27   Dg Fluoro Guide Lumbar Puncture  Result Date: 07/13/2016 CLINICAL DATA:  31 year old female with headache and history of idiopathic intracranial hypertension (pseudotumor cerebri). Diagnostic and therapeutic lumbar puncture under fluoroscopic guidance requested. Initial encounter. EXAM: DIAGNOSTIC LUMBAR PUNCTURE UNDER FLUOROSCOPIC GUIDANCE FLUOROSCOPY TIME:  Fluoroscopy Time:  0 minutes 18 seconds Radiation Exposure Index (if provided by the fluoroscopic device): Number of Acquired Spot Images: 0 PROCEDURE: Informed consent was obtained from the patient prior to the procedure, including potential complications of headache, allergy, and pain. A  "time-out" was performed. With the patient prone, the lower back was prepped with Betadine. 1% Lidocaine was used for local anesthesia. Lumbar puncture was performed at the L2-L3 level using a left sub laminar approach, a 3.5 inch x 20 gauge needle with return of clear, colorless CSF with an opening pressure of 22.5 cm water. 16 ml of CSF were obtained for laboratory studies. The needle was withdrawn, direct pressure was held, and hemostasis was noted. The patient tolerated the procedure well and there were no apparent complications. Appropriate post procedural orders were placed on the chart. The patient was returned to the inpatient floor in stable condition for continued treatment. IMPRESSION: Fluoroscopic guided lumbar puncture at L2-L3. Opening pressure of 22.5 cm of water. 16 mL of CSF obtained and sent to the lab for analysis. Electronically Signed   By: Odessa Fleming M.D.   On: 07/13/2016 11:52     CBC  Recent Labs Lab 07/11/16 1846 07/11/16 1913 07/13/16 0553  WBC 6.3  --  5.5  HGB 11.5* 11.6* 11.8*  HCT 36.2 34.0* 37.7  PLT 196  --  189  MCV 95.3  --  95.2  MCH 30.3  --  29.8  MCHC 31.8  --  31.3  RDW 14.1  --  14.0  LYMPHSABS 2.5  --   --   MONOABS 0.4  --   --   EOSABS 0.1  --   --   BASOSABS 0.0  --   --     Chemistries   Recent Labs Lab 07/11/16 1846 07/11/16 1913 07/12/16 0704 07/13/16 0553  NA 139 140 140 142  K 3.4* 3.5 4.5 3.9  CL 101 104 102 103  CO2 26  --  28 27  GLUCOSE 117* 120* 111*  108*  BUN 39* 41* 36* 41*  CREATININE 2.28* 2.40* 2.27* 2.84*  CALCIUM 9.5  --  9.4 9.4  MG  --   --  2.6*  --   AST 24  --   --   --   ALT 16  --   --   --   ALKPHOS 36*  --   --   --   BILITOT 1.0  --   --   --    ------------------------------------------------------------------------------------------------------------------ estimated creatinine clearance is 29.7 mL/min (by C-G formula based on SCr of 2.84 mg/dL  (H)). ------------------------------------------------------------------------------------------------------------------ No results for input(s): HGBA1C in the last 72 hours. ------------------------------------------------------------------------------------------------------------------ No results for input(s): CHOL, HDL, LDLCALC, TRIG, CHOLHDL, LDLDIRECT in the last 72 hours. ------------------------------------------------------------------------------------------------------------------ No results for input(s): TSH, T4TOTAL, T3FREE, THYROIDAB in the last 72 hours.  Invalid input(s): FREET3 ------------------------------------------------------------------------------------------------------------------ No results for input(s): VITAMINB12, FOLATE, FERRITIN, TIBC, IRON, RETICCTPCT in the last 72 hours.  Coagulation profile No results for input(s): INR, PROTIME in the last 168 hours.  No results for input(s): DDIMER in the last 72 hours.  Cardiac Enzymes No results for input(s): CKMB, TROPONINI, MYOGLOBIN in the last 168 hours.  Invalid input(s): CK ------------------------------------------------------------------------------------------------------------------ Invalid input(s): POCBNP   CBG:  Recent Labs Lab 07/12/16 0928 07/13/16 2140  GLUCAP 102* 313*       Studies: Ct Abdomen Pelvis Wo Contrast  Result Date: 07/13/2016 CLINICAL DATA:  Mediastinal fibrosis. History of renal transplant, right oophorectomy, retroperitoneal fibrosis, hernia repair and Port-A-Cath placement. Neurogenic bladder with frequent urinary tract infections. EXAM: CT CHEST, ABDOMEN AND PELVIS WITHOUT CONTRAST TECHNIQUE: Multidetector CT imaging of the chest, abdomen and pelvis was performed following the standard protocol without IV contrast. COMPARISON:  CXR from 07/11/2016 fell the left CT abdomen 04/28/2016 FINDINGS: CT CHEST FINDINGS Cardiovascular: Top normal size cardiac chambers without  pericardial effusion. No thoracic aortic aneurysm. Main pulmonary artery caliber is top-normal at approximately 2.9 cm. Port catheter noted in the right atrium with motion artifacts from the tip projecting into the right ventricle. Mediastinum/Nodes: No mediastinal adenopathy. No thyromegaly or discrete mass. The trachea and mainstem bronchi are patent. No esophageal abnormality by CT. Small hiatal hernia. Lungs/Pleura: Bibasilar dependent atelectasis. Trace pleural effusions. Mild lower lobe bronchiectasis bilaterally. No pneumonic consolidations. No dominant mass or pneumothorax. Musculoskeletal: Nonacute CT ABDOMEN PELVIS FINDINGS Hepatobiliary: No focal liver abnormality is seen. No gallstones, gallbladder wall thickening, or biliary dilatation. Probable minimal sludge along the dependent aspect of the gallbladder. Pancreas: Unremarkable. No pancreatic ductal dilatation or surrounding inflammatory changes. Spleen: Normal in size without focal abnormality. Adrenals/Urinary Tract: No acute abnormality of the adrenal glands. Status post right nephrectomy with transplanted left kidney. No obstruction of the transplanted kidney or nephrolithiasis. Neural stimulator noted overlying the left gluteal muscle with leads projecting into the right hemipelvis. Stomach/Bowel: Contracted stomach. Normal small bowel rotation. No bowel obstruction or acute inflammation. Mild submucosal fat deposition along the right colon. Vascular/Lymphatic: No significant vascular findings are present. No enlarged abdominal or pelvic lymph nodes. No aortic aneurysm. Reproductive: The uterus is unremarkable. The patient is status post right oophorectomy. No left adnexal mass. Other: Postoperative scarring in the right lower quadrant. Retroperitoneal surgical clips as well as right lower quadrant surgical clips presumably from prior nephrectomy. Subcutaneous left lower quadrant fatty induration likely related to injection of subcutaneous  medication. Musculoskeletal: Nonacute IMPRESSION: 1. Trace pleural effusions. 2. Port catheter tip noted into the right atrium. Cardiac pulsations may occasionally place tip across the tricuspid into right  ventricle. 3. Status post right nephrectomy with transplanted left kidney in the left lower quadrant. No obstructive uropathy. Neurostimulator device for neurogenic bladder noted. 4. Probable mild layering sludge along the dependent wall of the gallbladder. Electronically Signed   By: Tollie Eth M.D.   On: 07/13/2016 22:42   Ct Chest Wo Contrast  Result Date: 07/13/2016 CLINICAL DATA:  Mediastinal fibrosis. History of renal transplant, right oophorectomy, retroperitoneal fibrosis, hernia repair and Port-A-Cath placement. Neurogenic bladder with frequent urinary tract infections. EXAM: CT CHEST, ABDOMEN AND PELVIS WITHOUT CONTRAST TECHNIQUE: Multidetector CT imaging of the chest, abdomen and pelvis was performed following the standard protocol without IV contrast. COMPARISON:  CXR from 07/11/2016 fell the left CT abdomen 04/28/2016 FINDINGS: CT CHEST FINDINGS Cardiovascular: Top normal size cardiac chambers without pericardial effusion. No thoracic aortic aneurysm. Main pulmonary artery caliber is top-normal at approximately 2.9 cm. Port catheter noted in the right atrium with motion artifacts from the tip projecting into the right ventricle. Mediastinum/Nodes: No mediastinal adenopathy. No thyromegaly or discrete mass. The trachea and mainstem bronchi are patent. No esophageal abnormality by CT. Small hiatal hernia. Lungs/Pleura: Bibasilar dependent atelectasis. Trace pleural effusions. Mild lower lobe bronchiectasis bilaterally. No pneumonic consolidations. No dominant mass or pneumothorax. Musculoskeletal: Nonacute CT ABDOMEN PELVIS FINDINGS Hepatobiliary: No focal liver abnormality is seen. No gallstones, gallbladder wall thickening, or biliary dilatation. Probable minimal sludge along the dependent aspect  of the gallbladder. Pancreas: Unremarkable. No pancreatic ductal dilatation or surrounding inflammatory changes. Spleen: Normal in size without focal abnormality. Adrenals/Urinary Tract: No acute abnormality of the adrenal glands. Status post right nephrectomy with transplanted left kidney. No obstruction of the transplanted kidney or nephrolithiasis. Neural stimulator noted overlying the left gluteal muscle with leads projecting into the right hemipelvis. Stomach/Bowel: Contracted stomach. Normal small bowel rotation. No bowel obstruction or acute inflammation. Mild submucosal fat deposition along the right colon. Vascular/Lymphatic: No significant vascular findings are present. No enlarged abdominal or pelvic lymph nodes. No aortic aneurysm. Reproductive: The uterus is unremarkable. The patient is status post right oophorectomy. No left adnexal mass. Other: Postoperative scarring in the right lower quadrant. Retroperitoneal surgical clips as well as right lower quadrant surgical clips presumably from prior nephrectomy. Subcutaneous left lower quadrant fatty induration likely related to injection of subcutaneous medication. Musculoskeletal: Nonacute IMPRESSION: 1. Trace pleural effusions. 2. Port catheter tip noted into the right atrium. Cardiac pulsations may occasionally place tip across the tricuspid into right ventricle. 3. Status post right nephrectomy with transplanted left kidney in the left lower quadrant. No obstructive uropathy. Neurostimulator device for neurogenic bladder noted. 4. Probable mild layering sludge along the dependent wall of the gallbladder. Electronically Signed   By: Tollie Eth M.D.   On: 07/13/2016 22:42   Dg Fluoro Guide Lumbar Puncture  Result Date: 07/13/2016 CLINICAL DATA:  31 year old female with headache and history of idiopathic intracranial hypertension (pseudotumor cerebri). Diagnostic and therapeutic lumbar puncture under fluoroscopic guidance requested. Initial encounter.  EXAM: DIAGNOSTIC LUMBAR PUNCTURE UNDER FLUOROSCOPIC GUIDANCE FLUOROSCOPY TIME:  Fluoroscopy Time:  0 minutes 18 seconds Radiation Exposure Index (if provided by the fluoroscopic device): Number of Acquired Spot Images: 0 PROCEDURE: Informed consent was obtained from the patient prior to the procedure, including potential complications of headache, allergy, and pain. A "time-out" was performed. With the patient prone, the lower back was prepped with Betadine. 1% Lidocaine was used for local anesthesia. Lumbar puncture was performed at the L2-L3 level using a left sub laminar approach, a 3.5 inch x 20 gauge  needle with return of clear, colorless CSF with an opening pressure of 22.5 cm water. 16 ml of CSF were obtained for laboratory studies. The needle was withdrawn, direct pressure was held, and hemostasis was noted. The patient tolerated the procedure well and there were no apparent complications. Appropriate post procedural orders were placed on the chart. The patient was returned to the inpatient floor in stable condition for continued treatment. IMPRESSION: Fluoroscopic guided lumbar puncture at L2-L3. Opening pressure of 22.5 cm of water. 16 mL of CSF obtained and sent to the lab for analysis. Electronically Signed   By: Odessa Fleming M.D.   On: 07/13/2016 11:52      No results found for: HGBA1C Lab Results  Component Value Date   CREATININE 2.84 (H) 07/13/2016       Scheduled Meds: . acetaZOLAMIDE  500 mg Oral BID  . allopurinol  100 mg Oral BID  . aspirin  81 mg Oral Daily  . cefTRIAXone (ROCEPHIN)  IV  1 g Intravenous Q24H  . chlorhexidine  15 mL Mouth Rinse BID  . cholecalciferol  1,000 Units Oral Daily  . clonazePAM  0.5 mg Oral QHS  . cycloSPORINE modified  125 mg Oral BID  . famotidine (PEPCID) IV  20 mg Intravenous Q24H  . flavoxATE  100 mg Oral TID  . furosemide  40 mg Intravenous Q12H  . gemfibrozil  600 mg Oral Daily  . levETIRAcetam  500 mg Oral BID  . magnesium oxide  800 mg  Oral BID  . mouth rinse  15 mL Mouth Rinse q12n4p  . metoprolol  50 mg Oral BID  . mycophenolate  500 mg Oral BID  . omega-3 acid ethyl esters  1 g Oral BID  . pravastatin  40 mg Oral QHS  . predniSONE  2.5 mg Oral Q breakfast  . sertraline  100 mg Oral BID  . sodium chloride flush  3 mL Intravenous Q12H   Continuous Infusions:   LOS: 3 days    Time spent: >30 MINS    [facility]  [facility] Pager [phone]. If 7PM-7AM, please contact night-coverage at [email or web address], password [email or web address] 07/14/2016, 8:18 AM  LOS: 3 days

## 2016-07-15 LAB — COMPREHENSIVE METABOLIC PANEL
ALT: 11 U/L — AB (ref 14–54)
ANION GAP: 13 (ref 5–15)
AST: 18 U/L (ref 15–41)
Albumin: 4.1 g/dL (ref 3.5–5.0)
Alkaline Phosphatase: 35 U/L — ABNORMAL LOW (ref 38–126)
BILIRUBIN TOTAL: 0.7 mg/dL (ref 0.3–1.2)
BUN: 46 mg/dL — ABNORMAL HIGH (ref 6–20)
CO2: 27 mmol/L (ref 22–32)
Calcium: 9.5 mg/dL (ref 8.9–10.3)
Chloride: 102 mmol/L (ref 101–111)
Creatinine, Ser: 2.59 mg/dL — ABNORMAL HIGH (ref 0.44–1.00)
GFR calc Af Amer: 27 mL/min — ABNORMAL LOW (ref >60)
GFR calc non Af Amer: 23 mL/min — ABNORMAL LOW (ref >60)
Glucose, Bld: 107 mg/dL — ABNORMAL HIGH (ref 65–99)
Potassium: 3.6 mmol/L (ref 3.5–5.1)
Sodium: 142 mmol/L (ref 135–145)
TOTAL PROTEIN: 7.3 g/dL (ref 6.5–8.1)

## 2016-07-15 LAB — CBC
HCT: 35.9 % — ABNORMAL LOW (ref 36.0–46.0)
HEMOGLOBIN: 11.7 g/dL — AB (ref 12.0–15.0)
MCH: 29.8 pg (ref 26.0–34.0)
MCHC: 32.6 g/dL (ref 30.0–36.0)
MCV: 91.6 fL (ref 78.0–100.0)
Platelets: 203 10*3/uL (ref 150–400)
RBC: 3.92 MIL/uL (ref 3.87–5.11)
RDW: 13.7 % (ref 11.5–15.5)
WBC: 5.2 10*3/uL (ref 4.0–10.5)

## 2016-07-15 LAB — BLOOD GAS, ARTERIAL
ACID-BASE EXCESS: 1.5 mmol/L (ref 0.0–2.0)
Bicarbonate: 27 mmol/L (ref 20.0–28.0)
DRAWN BY: 105521
FIO2: 21
O2 Saturation: 95.9 %
Patient temperature: 98.6
pCO2 arterial: 54.2 mmHg — ABNORMAL HIGH (ref 32.0–48.0)
pH, Arterial: 7.318 — ABNORMAL LOW (ref 7.350–7.450)
pO2, Arterial: 82.8 mmHg — ABNORMAL LOW (ref 83.0–108.0)

## 2016-07-15 MED ORDER — FAMOTIDINE 20 MG PO TABS: 20.0000 mg | ORAL_TABLET | Freq: Every day | ORAL | Status: DC

## 2016-07-15 MED ORDER — CYCLOSPORINE MODIFIED (NEORAL) 25 MG PO CAPS: 125.0000 mg | ORAL_CAPSULE | Freq: Two times a day (BID) | ORAL | 1 refills | Status: AC

## 2016-07-15 MED ORDER — DEXTROSE 5 % IV SOLN: 1.0000 g | INTRAVENOUS | 0 refills | Status: AC

## 2016-07-15 MED ORDER — ACETAZOLAMIDE 250 MG PO TABS: 500.0000 mg | ORAL_TABLET | Freq: Two times a day (BID) | ORAL | 2 refills | Status: DC

## 2016-07-15 MED ORDER — FAMOTIDINE 20 MG PO TABS: 20.0000 mg | ORAL_TABLET | Freq: Every day | ORAL | 2 refills | Status: DC

## 2016-07-15 MED ORDER — FUROSEMIDE 40 MG PO TABS: 40.0000 mg | ORAL_TABLET | Freq: Two times a day (BID) | ORAL | 1 refills | Status: DC

## 2016-07-15 MED ORDER — MYCOPHENOLATE MOFETIL 250 MG PO CAPS: 500.0000 mg | ORAL_CAPSULE | Freq: Two times a day (BID) | ORAL | 2 refills | Status: AC

## 2016-07-15 MED ORDER — ONDANSETRON HCL 8 MG PO TABS: 4.0000 mg | ORAL_TABLET | Freq: Three times a day (TID) | ORAL | 0 refills | Status: DC | PRN

## 2016-07-15 NOTE — Evaluation (Signed)
Physical Therapy Evaluation Patient Details Name: Donna Shannon MRN: [id number] DOB: 09/22/1985 Today's Date: 07/15/2016   History of Present Illness  31 y.o. female with medical history significant for obesity, hypertension, chronic migraine, idiopathic intracranial hypertension, depression, anxiety, neurogenic bladder, and chronic kidney disease stage III involving her transplant kidney, admitted 07/11/16 with intractable headaches with blurred vision and nausea/vomiting.  Patient s/p LP 07/13/16.  Clinical Impression  Patient is functioning at supervision level for all mobility and gait.  Patient will have 24 hour support available at d/c.  No further PT needs identified at this time.  PT will sign off.   If balance does not return to baseline, may consider OP PT at later time.    Follow Up Recommendations No PT follow up;Supervision for mobility/OOB    Equipment Recommendations  None recommended by PT    Recommendations for Other Services       Precautions / Restrictions Precautions Precautions: None Precaution Comments: Vision improved today per patient Restrictions Weight Bearing Restrictions: No      Mobility  Bed Mobility Overal bed mobility: Modified Independent             General bed mobility comments: Increased time  Transfers Overall transfer level: Needs assistance Equipment used: None Transfers: Sit to/from Stand Sit to Stand: Supervision         General transfer comment: Supervision for safety only.  Ambulation/Gait Ambulation/Gait assistance: Supervision Ambulation Distance (Feet): 200 Feet Assistive device: None Gait Pattern/deviations: Step-through pattern;Decreased stride length Gait velocity: decreased Gait velocity interpretation: Below normal speed for age/gender General Gait Details: Patient with slow, guarded gait pattern with decreased trunk and cervical rotation.  Patient minimizes head turns even when talking during gait.  No loss of  balance during gait.  Stairs            Wheelchair Mobility    Modified Rankin (Stroke Patients Only)       Balance Overall balance assessment: Needs assistance         Standing balance support: No upper extremity supported Standing balance-Leahy Scale: Good               High level balance activites: Direction changes;Turns;Head turns High Level Balance Comments: No loss of balance, but performs very slowly.             Pertinent Vitals/Pain Pain Assessment: 0-10 Pain Score: 3  Pain Location: back Pain Descriptors / Indicators: Aching Pain Intervention(s): Monitored during session;Repositioned    Home Living Family/patient expects to be discharged to:: Private residence Living Arrangements: Parent Available Help at Discharge: Family;Available 24 hours/day Type of Home: House Home Access: Stairs to enter Entrance Stairs-Rails: Doctor, general practiceight;Left Entrance Stairs-Number of Steps: 4 Home Layout: One level Home Equipment: Walker - 2 wheels;Cane - single point      Prior Function Level of Independence: Independent         Comments: Has not been driving for 3-4 weeks due to vision changes per patient.     Hand Dominance        Extremity/Trunk Assessment   Upper Extremity Assessment Upper Extremity Assessment: Overall WFL for tasks assessed    Lower Extremity Assessment Lower Extremity Assessment: Overall WFL for tasks assessed       Communication   Communication: No difficulties  Cognition Arousal/Alertness: Awake/alert Behavior During Therapy: Flat affect Overall Cognitive Status: Within Functional Limits for tasks assessed                 General Comments: Slow  to respond at times.    General Comments      Exercises     Assessment/Plan    PT Assessment Patent does not need any further PT services  PT Problem List            PT Treatment Interventions      PT Goals (Current goals can be found in the Care Plan section)   Acute Rehab PT Goals PT Goal Formulation: All assessment and education complete, DC therapy    Frequency     Barriers to discharge        Co-evaluation               End of Session Equipment Utilized During Treatment: Gait belt Activity Tolerance: Patient tolerated treatment well Patient left: in bed;with call bell/phone within reach;with family/visitor present           Time: 4540-9811 PT Time Calculation (min) (ACUTE ONLY): 13 min   Charges:   PT Evaluation $PT Eval Moderate Complexity: 1 Procedure     PT G Codes:        Vena Austria 07/27/16, 3:12 PM Durenda Hurt. Renaldo Fiddler, [facility] Acute Rehab Services Pager [phone]

## 2016-07-15 NOTE — Progress Notes (Signed)
Patient is ready for discharge.  IV team de accessed Port a Cath.  Discharge instructions gone over with patient and patient's father.  Both stated that they understood the discharge instructions.   Jaclyn ShaggyStanfield, Teoman Giraud Everhart 4:59 PM 07/15/2016

## 2016-07-15 NOTE — Progress Notes (Addendum)
Subjective:  1500 UOP recorded- creatinine stable to improved.  Still feels better and wants to go home  Objective Vital signs in last 24 hours: Vitals:   07/14/16 1907 07/15/16 0043 07/15/16 0313 07/15/16 0700  BP: 107/77 (!) 127/97 115/84 110/72  Pulse: 98 83 88 89  Resp: 15 12 12 14   Temp: 98.4 F (36.9 C) 98.9 F (37.2 C) 97.8 F (36.6 C) 98.1 F (36.7 C)  TempSrc: Oral Oral Oral Oral  SpO2: 91% 99% 97% 95%  Weight:   89.7 kg (197 lb 12 oz)   Height:       Weight change: -0.3 kg (-10.6 oz)  Intake/Output Summary (Last 24 hours) at 07/15/16 0840 Last data filed at 07/15/16 0800  Gross per 24 hour  Intake           915.67 ml  Output             1490 ml  Net          -574.33 ml    Assessment/Plan: 31 year old white female status post her second kidney transplant in 1998 on cyclosporine, mycophenolate and prednisone who is had an issue of late with migraines and possible pseudotumor cerebri 1.Renal- status post second renal transplant in 1998. Baseline creatinine was around 2. She recently had an Escherichia coli urinary tract infection and was placed on Bactrim. Creatinine was 2.8 which could be due to bactrim versus UTI versus just clinical scenario- is 2.6 today.  making good urine, would continue lasix 40 BID. Objectively does not appear she has diuresed that effectively but some 2. Neuro  - this seems to be her biggest issue right now.  Dangela changed neurologically and it was getting worse PTA. She's always been on a lot of these comfort and sedating medications that I have tried to decrease but the patient has  not been willing. Has improved much during hosp !  Only concrete thing done was LP ? Now concern over SVC syndrome causing this whole clinical picture but dopplers and scans not showing (although not sure if they would) Since is better may be able to table removal of portacath but concerned she will have recurrence  3. Volume- as above are not sure she's total body  volume overloaded. I actually wonder about SVC syndrome given that most of her fluid seems to be in her head and neck. She has a Port-A-Cath in place that she is dependant on so unfortunately I think that would be difficult to remedy- place back on OP lasix dosing  4. Anemia  - is not a significant issue at this time 5. UTI- she had as outpatient it was Escherichia coli sensitive to ceftriaxone -  Treat her with that for now- s/p 4 days- can continue at her OP infusion center 6. Status post renal transplant- continue her regimen of cyclosporine 125 twice a day, mycophenolate 500 twice a day, prednisone 2.5 mg daily- creatinine heading in right direction 7. She is much clinically improved so think she can be discharged.  Worried that she will have recurrence and need occasional LP's vs shunt for this issue ?  Just make sure she gets her rocephin today prior to discharge.  Since I am leaving service, will sign off but follow at distance and arrange follow up with me and abx for her UTI    Jori Frerichs A    Labs: Basic Metabolic Panel:  Recent Labs Lab 07/13/16 0553 07/14/16 0720 07/15/16 0302  NA 142 139 142  K 3.9 3.9 3.6  CL 103 100* 102  CO2 27 25 27   GLUCOSE 108* 96 107*  BUN 41* 47* 46*  CREATININE 2.84* 2.76* 2.59*  CALCIUM 9.4 9.6 9.5   Liver Function Tests:  Recent Labs Lab 07/11/16 1846 07/14/16 0720 07/15/16 0302  AST 24 23 18   ALT 16 13* 11*  ALKPHOS 36* 33* 35*  BILITOT 1.0 0.8 0.7  PROT 7.1 7.2 7.3  ALBUMIN 4.0 4.1 4.1   No results for input(s): LIPASE, AMYLASE in the last 168 hours. No results for input(s): AMMONIA in the last 168 hours. CBC:  Recent Labs Lab 07/11/16 1846 07/11/16 1913 07/13/16 0553 07/15/16 0302  WBC 6.3  --  5.5 5.2  NEUTROABS 3.2  --   --   --   HGB 11.5* 11.6* 11.8* 11.7*  HCT 36.2 34.0* 37.7 35.9*  MCV 95.3  --  95.2 91.6  PLT 196  --  189 203   Cardiac Enzymes: No results for input(s): CKTOTAL, CKMB, CKMBINDEX,  TROPONINI in the last 168 hours. CBG:  Recent Labs Lab 07/12/16 0928 07/13/16 2140  GLUCAP 102* 313*    Iron Studies: No results for input(s): IRON, TIBC, TRANSFERRIN, FERRITIN in the last 72 hours. Studies/Results: Ct Abdomen Pelvis Wo Contrast  Result Date: 07/13/2016 CLINICAL DATA:  Mediastinal fibrosis. History of renal transplant, right oophorectomy, retroperitoneal fibrosis, hernia repair and Port-A-Cath placement. Neurogenic bladder with frequent urinary tract infections. EXAM: CT CHEST, ABDOMEN AND PELVIS WITHOUT CONTRAST TECHNIQUE: Multidetector CT imaging of the chest, abdomen and pelvis was performed following the standard protocol without IV contrast. COMPARISON:  CXR from 07/11/2016 fell the left CT abdomen 04/28/2016 FINDINGS: CT CHEST FINDINGS Cardiovascular: Top normal size cardiac chambers without pericardial effusion. No thoracic aortic aneurysm. Main pulmonary artery caliber is top-normal at approximately 2.9 cm. Port catheter noted in the right atrium with motion artifacts from the tip projecting into the right ventricle. Mediastinum/Nodes: No mediastinal adenopathy. No thyromegaly or discrete mass. The trachea and mainstem bronchi are patent. No esophageal abnormality by CT. Small hiatal hernia. Lungs/Pleura: Bibasilar dependent atelectasis. Trace pleural effusions. Mild lower lobe bronchiectasis bilaterally. No pneumonic consolidations. No dominant mass or pneumothorax. Musculoskeletal: Nonacute CT ABDOMEN PELVIS FINDINGS Hepatobiliary: No focal liver abnormality is seen. No gallstones, gallbladder wall thickening, or biliary dilatation. Probable minimal sludge along the dependent aspect of the gallbladder. Pancreas: Unremarkable. No pancreatic ductal dilatation or surrounding inflammatory changes. Spleen: Normal in size without focal abnormality. Adrenals/Urinary Tract: No acute abnormality of the adrenal glands. Status post right nephrectomy with transplanted left kidney. No  obstruction of the transplanted kidney or nephrolithiasis. Neural stimulator noted overlying the left gluteal muscle with leads projecting into the right hemipelvis. Stomach/Bowel: Contracted stomach. Normal small bowel rotation. No bowel obstruction or acute inflammation. Mild submucosal fat deposition along the right colon. Vascular/Lymphatic: No significant vascular findings are present. No enlarged abdominal or pelvic lymph nodes. No aortic aneurysm. Reproductive: The uterus is unremarkable. The patient is status post right oophorectomy. No left adnexal mass. Other: Postoperative scarring in the right lower quadrant. Retroperitoneal surgical clips as well as right lower quadrant surgical clips presumably from prior nephrectomy. Subcutaneous left lower quadrant fatty induration likely related to injection of subcutaneous medication. Musculoskeletal: Nonacute IMPRESSION: 1. Trace pleural effusions. 2. Port catheter tip noted into the right atrium. Cardiac pulsations may occasionally place tip across the tricuspid into right ventricle. 3. Status post right nephrectomy with transplanted left kidney in the left lower quadrant. No obstructive uropathy. Neurostimulator device  for neurogenic bladder noted. 4. Probable mild layering sludge along the dependent wall of the gallbladder. Electronically Signed   By: Tollie Ethavid  Kwon M.D.   On: 07/13/2016 22:42   Ct Chest Wo Contrast  Result Date: 07/13/2016 CLINICAL DATA:  Mediastinal fibrosis. History of renal transplant, right oophorectomy, retroperitoneal fibrosis, hernia repair and Port-A-Cath placement. Neurogenic bladder with frequent urinary tract infections. EXAM: CT CHEST, ABDOMEN AND PELVIS WITHOUT CONTRAST TECHNIQUE: Multidetector CT imaging of the chest, abdomen and pelvis was performed following the standard protocol without IV contrast. COMPARISON:  CXR from 07/11/2016 fell the left CT abdomen 04/28/2016 FINDINGS: CT CHEST FINDINGS Cardiovascular: Top normal size  cardiac chambers without pericardial effusion. No thoracic aortic aneurysm. Main pulmonary artery caliber is top-normal at approximately 2.9 cm. Port catheter noted in the right atrium with motion artifacts from the tip projecting into the right ventricle. Mediastinum/Nodes: No mediastinal adenopathy. No thyromegaly or discrete mass. The trachea and mainstem bronchi are patent. No esophageal abnormality by CT. Small hiatal hernia. Lungs/Pleura: Bibasilar dependent atelectasis. Trace pleural effusions. Mild lower lobe bronchiectasis bilaterally. No pneumonic consolidations. No dominant mass or pneumothorax. Musculoskeletal: Nonacute CT ABDOMEN PELVIS FINDINGS Hepatobiliary: No focal liver abnormality is seen. No gallstones, gallbladder wall thickening, or biliary dilatation. Probable minimal sludge along the dependent aspect of the gallbladder. Pancreas: Unremarkable. No pancreatic ductal dilatation or surrounding inflammatory changes. Spleen: Normal in size without focal abnormality. Adrenals/Urinary Tract: No acute abnormality of the adrenal glands. Status post right nephrectomy with transplanted left kidney. No obstruction of the transplanted kidney or nephrolithiasis. Neural stimulator noted overlying the left gluteal muscle with leads projecting into the right hemipelvis. Stomach/Bowel: Contracted stomach. Normal small bowel rotation. No bowel obstruction or acute inflammation. Mild submucosal fat deposition along the right colon. Vascular/Lymphatic: No significant vascular findings are present. No enlarged abdominal or pelvic lymph nodes. No aortic aneurysm. Reproductive: The uterus is unremarkable. The patient is status post right oophorectomy. No left adnexal mass. Other: Postoperative scarring in the right lower quadrant. Retroperitoneal surgical clips as well as right lower quadrant surgical clips presumably from prior nephrectomy. Subcutaneous left lower quadrant fatty induration likely related to  injection of subcutaneous medication. Musculoskeletal: Nonacute IMPRESSION: 1. Trace pleural effusions. 2. Port catheter tip noted into the right atrium. Cardiac pulsations may occasionally place tip across the tricuspid into right ventricle. 3. Status post right nephrectomy with transplanted left kidney in the left lower quadrant. No obstructive uropathy. Neurostimulator device for neurogenic bladder noted. 4. Probable mild layering sludge along the dependent wall of the gallbladder. Electronically Signed   By: Tollie Ethavid  Kwon M.D.   On: 07/13/2016 22:42   Dg Fluoro Guide Lumbar Puncture  Result Date: 07/13/2016 CLINICAL DATA:  31 year old female with headache and history of idiopathic intracranial hypertension (pseudotumor cerebri). Diagnostic and therapeutic lumbar puncture under fluoroscopic guidance requested. Initial encounter. EXAM: DIAGNOSTIC LUMBAR PUNCTURE UNDER FLUOROSCOPIC GUIDANCE FLUOROSCOPY TIME:  Fluoroscopy Time:  0 minutes 18 seconds Radiation Exposure Index (if provided by the fluoroscopic device): Number of Acquired Spot Images: 0 PROCEDURE: Informed consent was obtained from the patient prior to the procedure, including potential complications of headache, allergy, and pain. A "time-out" was performed. With the patient prone, the lower back was prepped with Betadine. 1% Lidocaine was used for local anesthesia. Lumbar puncture was performed at the L2-L3 level using a left sub laminar approach, a 3.5 inch x 20 gauge needle with return of clear, colorless CSF with an opening pressure of 22.5 cm water. 16 ml of CSF were  obtained for laboratory studies. The needle was withdrawn, direct pressure was held, and hemostasis was noted. The patient tolerated the procedure well and there were no apparent complications. Appropriate post procedural orders were placed on the chart. The patient was returned to the inpatient floor in stable condition for continued treatment. IMPRESSION: Fluoroscopic guided lumbar  puncture at L2-L3. Opening pressure of 22.5 cm of water. 16 mL of CSF obtained and sent to the lab for analysis. Electronically Signed   By: Odessa Fleming M.D.   On: 07/13/2016 11:52   Medications: Infusions:   Scheduled Medications: . acetaZOLAMIDE  500 mg Oral BID  . allopurinol  100 mg Oral BID  . aspirin  81 mg Oral Daily  . cefTRIAXone (ROCEPHIN)  IV  1 g Intravenous Q24H  . chlorhexidine  15 mL Mouth Rinse BID  . cholecalciferol  1,000 Units Oral Daily  . clonazePAM  0.5 mg Oral QHS  . cycloSPORINE modified  125 mg Oral BID  . famotidine (PEPCID) IV  20 mg Intravenous Q24H  . flavoxATE  100 mg Oral TID  . furosemide  40 mg Oral BID  . gemfibrozil  600 mg Oral Daily  . levETIRAcetam  500 mg Oral BID  . magnesium oxide  800 mg Oral BID  . mouth rinse  15 mL Mouth Rinse q12n4p  . metoprolol  50 mg Oral BID  . mycophenolate  500 mg Oral BID  . omega-3 acid ethyl esters  1 g Oral BID  . pravastatin  40 mg Oral QHS  . predniSONE  2.5 mg Oral Q breakfast  . sertraline  100 mg Oral BID  . sodium chloride flush  3 mL Intravenous Q12H    have reviewed scheduled and prn medications.  Physical Exam: General: sleepy- but does report that she feels better- no headache and vision is improved - seems perkier  Heart: RRR-  Lungs: poor effort Abdomen: obese, soft Extremities: minimal edema    07/15/2016,8:40 AM  LOS: 4 days

## 2016-07-15 NOTE — Care Management Note (Signed)
Case Management Note  Patient Details  Name: Donna Shannon MRN: [id number] Date of Birth: 07/12/1985  Subjective/Objective:                  Renal transplant recipient/Intractable chronic migraine without aura and with status migrainosus Action/Plan: Discharge planning Expected Discharge Date:  07/16/16               Expected Discharge Plan:  Home w Home Health Services  In-House Referral:     Discharge planning Services  CM Consult  Post Acute Care Choice:  Home Health, Durable Medical Equipment Choice offered to:  Patient  DME Arranged:  IV pump/equipment, Bipap DME Agency:     HH Arranged:  RN HH Agency:     Status of Service:  In process, will continue to follow  If discussed at Long Length of Stay Meetings, dates discussed:    Additional Comments: CM received call from RN stating pt is being discharged and will need home IV ABX and BiPaP.  As it was - already past noon on a Sunday; - [place]; -pt's Infusion team she used in the past [facility] closed and  rep, Donna Shannon [phone] had voicemail to call during normal business hours M-F (unless emergency-which this is not), -pt unsure of her home O2 provider to arrange for BiPaP; This CM called Abrol, MD and stated these arrangements will have to be accomplished tomorrow during normal business hours.  This CM called and spoke with pt, who though unhappy, states understanding; and notified RN. Weekday CM will arrange 07/16/16. Yves DillJeffries, Saory Carriero Christine, RN 07/15/2016, 4:53 PM

## 2016-07-15 NOTE — Progress Notes (Signed)
Bipap setting 14 I time and 5 e time.

## 2016-07-15 NOTE — Progress Notes (Addendum)
[facility] PROGRESS NOTE  Donna Shannon XBD:[id number] DOB: Jul 14, 1985 DOA: 07/11/2016   PCP: No PCP Per Patient     Assessment/Plan: Principal Problem:   Intractable chronic migraine without aura and with status migrainosus Active Problems:   Renal transplant recipient   Depression   Essential hypertension   Intracranial hypertension   HA (headache)   Chronic renal disease, stage III   Chronic diastolic heart failure (HCC)   OSA (obstructive sleep apnea)   Headache   Intractable headache   Chronic intractable headache   Acute on chronic diastolic heart failure ([place])    31 y.o. female with medical history significant for obesity, hypertension, chronic migraine, idiopathic intracranial hypertension, depression, anxiety, neurogenic bladder, and chronic kidney disease stage III involving her transplant kidney, now presenting to the emergency department at the direction of her neurologist for evaluation of intractable headaches with blurred vision and nausea/vomiting.Neurologist was also consulted by the ED physician and has evaluated the patient in the emergency department. A medical admission is been advised for further evaluation and management of intractable headache with history of idiopathic intracranial hypertension.   Assessment and plan 1. Intractable headache  - Pt presents with her typical headache, but has been progressively worsening over the past month  - Head CT reveals normal brain and no focal neurologic deficits are appreciated on exam  - She has hx of idiopathic intracranial hypertension and reports temporary relief of these same sxs in October 2017 with LP  Neurology following-status post LP, opening pressure not very high , opening pressure 22   apparently has a diagnosis of pseudotumor cerebri . Had some post LP headache  During her prior admission, her headache was also addressed and diagnostic lumbar puncture was performed with reported opening  pressure of 27, decreasing to 13 after removal of 20 cc CSF. Currently on Diamox 250 mg BID at that time Also concerned about SVC syndrome-not a candidate for invasive venography  given worsening renal function However could consider MRV(without contast) as outpatient -as neurology continues to be concerned about SVC syndrome Patient has declined Port-A-Cath removal-neurology continues to encourage this as a viable treatment option Venous Doppler of bilateral upper extremities negative   2. Acute on chronic diastolic CHF  - Pt appears to have excess volume on admission  - TTE (04/28/16) with EF 83-41%, grade 2 diastolic dysfunction, mild LVH, and mild TR  - She is managed with Lasix 40 mg IV, metoprolol, and Diamox  Diuretics being titrated by nephrology,       3. CKD stage III; s/p renal transplantation - SCr is 2.76  today ; baseline appears to be ~2  - She is on her second related living donor transplanted kidney from related living donor, this one placed in late 1990's  - Continue anti-rejection medications  - Nephrology managing diuretics Increase in Creatinine  could be due to bactrim versus UTI versus just clinical scenario Creatinine 2.84>2.76>2.59   4. Hypertension  - BP at goal on admission  - Continue current management with Lopressor and Lasix as tolerated   5. OSA - Continue CPAP qHS Now requiring BiPAP support   6. Anxiety, insomnia , acute toxic encephalopathy  initially wasOn multiple sedating medication Phenergan, Klonopin, Ambien, Dilaudid Dose of Klonopin has been reduced Received 2 doses of narcan and one dose of flumazenil 07/12/16 around 11 AM Transferred to stepdown on BiPAP due to elevated CO2, pH of 7.25  Now improving, can transfer her back to  telemetry  7. 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 Venous doppler to assess for evidence of UE DVT-negative  Invasive contrast venography is the most conclusive diagnostic tool , will discuss options with IR in am  CT  chest abdomen pelvis to rule out mediastinal/retroperitoneal fibrosisComment negative   8. Hypercapnic/hypoxic respiratory failure requiring BiPAP support Patient's low PH could be secondary to Diamox, discussed with nephrology Patient needs to be treated primarily based on respiratory symptoms Do not suspect pneumonia  9. UTI-nephrology to make recommendations about further antibiotics and will make arrangements   DVT prophylaxsis heparin  Code Status:  Full code    Family Communication: Discussed in detail with the patient, all imaging results, lab results explained to the patient   Disposition Plan:    need to arrange for Bipap at home       Consultants:  Nephrology  Neurology    Procedures:  None  Antibiotics: Anti-infectives    Start     Dose/Rate Route Frequency Ordered Stop   07/12/16 1400  cefTRIAXone (ROCEPHIN) 1 g in dextrose 5 % 50 mL IVPB    Comments:  Using sensitivities from OP spec- E coli   1 g 100 mL/hr over 30 Minutes Intravenous Every 24 hours 07/12/16 1358           HPI/Subjective: Feeling better , no HA, NO N , met with father by the bedside   Objective: Vitals:   07/14/16 1907 07/15/16 0043 07/15/16 0313 07/15/16 0700  BP: 107/77 (!) 127/97 115/84 110/72  Pulse: 98 83 88 89  Resp: '15 12 12 14  ' Temp: 98.4 F (36.9 C) 98.9 F (37.2 C) 97.8 F (36.6 C) 98.1 F (36.7 C)  TempSrc: Oral Oral Oral Oral  SpO2: 91% 99% 97% 95%  Weight:   89.7 kg (197 lb 12 oz)   Height:        Intake/Output Summary (Last 24 hours) at 07/15/16 0842 Last data filed at 07/15/16 0800  Gross per 24 hour  Intake           915.67 ml  Output             1490 ml  Net          -574.33 ml    Exam:  Examination:  General exam: Appears calm and comfortable  Respiratory system: Clear to auscultation. Respiratory effort normal. Cardiovascular system: S1 & S2 heard, RRR. No JVD, murmurs, rubs, gallops or clicks. No pedal edema. Gastrointestinal system:  Abdomen is nondistended, soft and nontender. No organomegaly or masses felt. Normal bowel sounds heard. Central nervous system: Alert and oriented. No focal neurological deficits. Extremities: Symmetric 5 x 5 power. Skin: No rashes, lesions or ulcers Psychiatry: Judgement and insight appear normal. Mood & affect appropriate.     Data Reviewed: I have personally reviewed following labs and imaging studies  Micro Results Recent Results (from the past 240 hour(s))  MRSA PCR Screening     Status: None   Collection Time: 07/12/16 12:05 PM  Result Value Ref Range Status   MRSA by PCR NEGATIVE NEGATIVE Final    Comment:        The GeneXpert MRSA Assay (FDA approved for NASAL specimens only), is one component of a comprehensive MRSA colonization surveillance program. It is not intended to diagnose MRSA infection nor to guide or monitor treatment for MRSA infections.   Anaerobic culture     Status: None (Preliminary result)   Collection Time: 07/13/16  9:27 AM  Result Value Ref  Range Status   Specimen Description CSF  Final   Special Requests NONE  Final   Culture PENDING  Incomplete   Report Status PENDING  Incomplete  CSF culture     Status: None (Preliminary result)   Collection Time: 07/13/16  9:27 AM  Result Value Ref Range Status   Specimen Description CSF  Final   Special Requests NONE  Final   Gram Stain   Final    WBC PRESENT, PREDOMINANTLY MONONUCLEAR NO ORGANISMS SEEN CYTOSPIN SMEAR    Culture NO GROWTH 1 DAY  Final   Report Status PENDING  Incomplete    Radiology Reports Ct Abdomen Pelvis Wo Contrast  Result Date: 07/13/2016 CLINICAL DATA:  Mediastinal fibrosis. History of renal transplant, right oophorectomy, retroperitoneal fibrosis, hernia repair and Port-A-Cath placement. Neurogenic bladder with frequent urinary tract infections. EXAM: CT CHEST, ABDOMEN AND PELVIS WITHOUT CONTRAST TECHNIQUE: Multidetector CT imaging of the chest, abdomen and pelvis was  performed following the standard protocol without IV contrast. COMPARISON:  CXR from 07/11/2016 fell the left CT abdomen 04/28/2016 FINDINGS: CT CHEST FINDINGS Cardiovascular: Top normal size cardiac chambers without pericardial effusion. No thoracic aortic aneurysm. Main pulmonary artery caliber is top-normal at approximately 2.9 cm. Port catheter noted in the right atrium with motion artifacts from the tip projecting into the right ventricle. Mediastinum/Nodes: No mediastinal adenopathy. No thyromegaly or discrete mass. The trachea and mainstem bronchi are patent. No esophageal abnormality by CT. Small hiatal hernia. Lungs/Pleura: Bibasilar dependent atelectasis. Trace pleural effusions. Mild lower lobe bronchiectasis bilaterally. No pneumonic consolidations. No dominant mass or pneumothorax. Musculoskeletal: Nonacute CT ABDOMEN PELVIS FINDINGS Hepatobiliary: No focal liver abnormality is seen. No gallstones, gallbladder wall thickening, or biliary dilatation. Probable minimal sludge along the dependent aspect of the gallbladder. Pancreas: Unremarkable. No pancreatic ductal dilatation or surrounding inflammatory changes. Spleen: Normal in size without focal abnormality. Adrenals/Urinary Tract: No acute abnormality of the adrenal glands. Status post right nephrectomy with transplanted left kidney. No obstruction of the transplanted kidney or nephrolithiasis. Neural stimulator noted overlying the left gluteal muscle with leads projecting into the right hemipelvis. Stomach/Bowel: Contracted stomach. Normal small bowel rotation. No bowel obstruction or acute inflammation. Mild submucosal fat deposition along the right colon. Vascular/Lymphatic: No significant vascular findings are present. No enlarged abdominal or pelvic lymph nodes. No aortic aneurysm. Reproductive: The uterus is unremarkable. The patient is status post right oophorectomy. No left adnexal mass. Other: Postoperative scarring in the right lower  quadrant. Retroperitoneal surgical clips as well as right lower quadrant surgical clips presumably from prior nephrectomy. Subcutaneous left lower quadrant fatty induration likely related to injection of subcutaneous medication. Musculoskeletal: Nonacute IMPRESSION: 1. Trace pleural effusions. 2. Port catheter tip noted into the right atrium. Cardiac pulsations may occasionally place tip across the tricuspid into right ventricle. 3. Status post right nephrectomy with transplanted left kidney in the left lower quadrant. No obstructive uropathy. Neurostimulator device for neurogenic bladder noted. 4. Probable mild layering sludge along the dependent wall of the gallbladder. Electronically Signed   By: Ashley Royalty M.D.   On: 07/13/2016 22:42   Ct Head Wo Contrast  Result Date: 07/11/2016 CLINICAL DATA:  Headache for 3 months. EXAM: CT HEAD WITHOUT CONTRAST TECHNIQUE: Contiguous axial images were obtained from the base of the skull through the vertex without intravenous contrast. COMPARISON:  04/29/2016 FINDINGS: Brain: There is no intracranial hemorrhage, mass or evidence of acute infarction. There is no extra-axial fluid collection. Gray matter and white matter appear normal. Cerebral volume is normal  for age. Brainstem and posterior fossa are unremarkable. The CSF spaces appear normal. Vascular: No hyperdense vessel or unexpected calcification. Skull: Normal. Negative for fracture or focal lesion. Sinuses/Orbits: No acute finding. Other: None. IMPRESSION: Normal brain Electronically Signed   By: Andreas Newport M.D.   On: 07/11/2016 21:06   Ct Chest Wo Contrast  Result Date: 07/13/2016 CLINICAL DATA:  Mediastinal fibrosis. History of renal transplant, right oophorectomy, retroperitoneal fibrosis, hernia repair and Port-A-Cath placement. Neurogenic bladder with frequent urinary tract infections. EXAM: CT CHEST, ABDOMEN AND PELVIS WITHOUT CONTRAST TECHNIQUE: Multidetector CT imaging of the chest, abdomen and  pelvis was performed following the standard protocol without IV contrast. COMPARISON:  CXR from 07/11/2016 fell the left CT abdomen 04/28/2016 FINDINGS: CT CHEST FINDINGS Cardiovascular: Top normal size cardiac chambers without pericardial effusion. No thoracic aortic aneurysm. Main pulmonary artery caliber is top-normal at approximately 2.9 cm. Port catheter noted in the right atrium with motion artifacts from the tip projecting into the right ventricle. Mediastinum/Nodes: No mediastinal adenopathy. No thyromegaly or discrete mass. The trachea and mainstem bronchi are patent. No esophageal abnormality by CT. Small hiatal hernia. Lungs/Pleura: Bibasilar dependent atelectasis. Trace pleural effusions. Mild lower lobe bronchiectasis bilaterally. No pneumonic consolidations. No dominant mass or pneumothorax. Musculoskeletal: Nonacute CT ABDOMEN PELVIS FINDINGS Hepatobiliary: No focal liver abnormality is seen. No gallstones, gallbladder wall thickening, or biliary dilatation. Probable minimal sludge along the dependent aspect of the gallbladder. Pancreas: Unremarkable. No pancreatic ductal dilatation or surrounding inflammatory changes. Spleen: Normal in size without focal abnormality. Adrenals/Urinary Tract: No acute abnormality of the adrenal glands. Status post right nephrectomy with transplanted left kidney. No obstruction of the transplanted kidney or nephrolithiasis. Neural stimulator noted overlying the left gluteal muscle with leads projecting into the right hemipelvis. Stomach/Bowel: Contracted stomach. Normal small bowel rotation. No bowel obstruction or acute inflammation. Mild submucosal fat deposition along the right colon. Vascular/Lymphatic: No significant vascular findings are present. No enlarged abdominal or pelvic lymph nodes. No aortic aneurysm. Reproductive: The uterus is unremarkable. The patient is status post right oophorectomy. No left adnexal mass. Other: Postoperative scarring in the right  lower quadrant. Retroperitoneal surgical clips as well as right lower quadrant surgical clips presumably from prior nephrectomy. Subcutaneous left lower quadrant fatty induration likely related to injection of subcutaneous medication. Musculoskeletal: Nonacute IMPRESSION: 1. Trace pleural effusions. 2. Port catheter tip noted into the right atrium. Cardiac pulsations may occasionally place tip across the tricuspid into right ventricle. 3. Status post right nephrectomy with transplanted left kidney in the left lower quadrant. No obstructive uropathy. Neurostimulator device for neurogenic bladder noted. 4. Probable mild layering sludge along the dependent wall of the gallbladder. Electronically Signed   By: Ashley Royalty M.D.   On: 07/13/2016 22:42   Mr Brain Wo Contrast  Result Date: 07/06/2016 CLINICAL DATA:  Severe head pressure and blurred vision. Patient has neuro stimulator. Limited SAR study. EXAM: MRI HEAD WITHOUT CONTRAST TECHNIQUE: Multiplanar, multiecho pulse sequences of the brain and surrounding structures were obtained without intravenous contrast. COMPARISON:  Head CT 04/29/2016 FINDINGS: Brain: The brain has normal appearance without evidence of malformation, atrophy, old or acute small or large vessel infarction, hemorrhage, hydrocephalus or extra-axial collection. No pituitary abnormality. Vascular: Major vessels at the base of the brain show flow. Skull and upper cervical spine: Normal Sinuses/Orbits: Clear/ normal. Other: None significant. IMPRESSION: Normal brain MRI. No cause of the presenting symptoms is identified. Electronically Signed   By: Nelson Chimes M.D.   On: 07/06/2016 19:24  Dg Chest Port 1 View  Result Date: 07/11/2016 CLINICAL DATA:  Edema and migraine headache.  Renal failure. EXAM: PORTABLE CHEST 1 VIEW COMPARISON:  Two-view chest x-ray 04/28/2016 FINDINGS: A right IJ double-lumen Port-A-Cath extends into the right atrium. Heart is enlarged. Mild edema is present. No  definite lesions are present. Mild bibasilar airspace disease likely reflects atelectasis. IMPRESSION: 1. Cardiomegaly with mild interstitial edema. 2. No focal airspace disease. 3. Stable right IJ Port-A-Cath. Electronically Signed   By: San Morelle M.D.   On: 07/11/2016 20:15   Mr Mrv Head Wo Cm  Result Date: 07/06/2016 CLINICAL DATA:  Severe head pressure and blurred vision. Patient has neuro stimulator. Limited SAR study EXAM: MR VENOGRAM the HEAD WITHOUT CONTRAST TECHNIQUE: Angiographic images of the intracranial venous structures were obtained using MRV technique without intravenous contrast. COMPARISON:  Head study same day FINDINGS: Superior sagittal sinus is patent. Transverse and sigmoid sinuses are patent. Deep venous system and straight sinus are patent. No thrombosed cortical veins identified. IMPRESSION: Normal intracranial MR venography. Electronically Signed   By: Nelson Chimes M.D.   On: 07/06/2016 19:25   Mr Darnelle Catalan HM Contrast  Result Date: 07/06/2016 CLINICAL DATA:  Severe head pressure and blurred vision. Patient has neuro stimulator. Limited SAR study EXAM: MRI OF THE ORBITS WITHOUT CONTRAST TECHNIQUE: Multiplanar, multisequence MR imaging of the orbits was performed. No intravenous contrast was administered. COMPARISON:  MRI and MRV same day FINDINGS: Orbits: Both globes appear normal. Optic nerves and nerve sheaths appear normal. Extra-ocular muscles are normal. Intraorbital fat is normal. Lacrimal glands are normal. Orbital apices are normal. Cavernous sinuses are normal. Visualized sinuses: Clear and normal Soft tissues: No significant finding Limited intracranial: Negative as on the full study. IMPRESSION: Normal orbital exam. Electronically Signed   By: Nelson Chimes M.D.   On: 07/06/2016 19:27   Dg Fluoro Guide Lumbar Puncture  Result Date: 07/13/2016 CLINICAL DATA:  31 year old female with headache and history of idiopathic intracranial hypertension (pseudotumor  cerebri). Diagnostic and therapeutic lumbar puncture under fluoroscopic guidance requested. Initial encounter. EXAM: DIAGNOSTIC LUMBAR PUNCTURE UNDER FLUOROSCOPIC GUIDANCE FLUOROSCOPY TIME:  Fluoroscopy Time:  0 minutes 18 seconds Radiation Exposure Index (if provided by the fluoroscopic device): Number of Acquired Spot Images: 0 PROCEDURE: Informed consent was obtained from the patient prior to the procedure, including potential complications of headache, allergy, and pain. A "time-out" was performed. With the patient prone, the lower back was prepped with Betadine. 1% Lidocaine was used for local anesthesia. Lumbar puncture was performed at the L2-L3 level using a left sub laminar approach, a 3.5 inch x 20 gauge needle with return of clear, colorless CSF with an opening pressure of 22.5 cm water. 16 ml of CSF were obtained for laboratory studies. The needle was withdrawn, direct pressure was held, and hemostasis was noted. The patient tolerated the procedure well and there were no apparent complications. Appropriate post procedural orders were placed on the chart. The patient was returned to the inpatient floor in stable condition for continued treatment. IMPRESSION: Fluoroscopic guided lumbar puncture at L2-L3. Opening pressure of 22.5 cm of water. 16 mL of CSF obtained and sent to the lab for analysis. Electronically Signed   By: Genevie Ann M.D.   On: 07/13/2016 11:52     CBC  Recent Labs Lab 07/11/16 1846 07/11/16 1913 07/13/16 0553 07/15/16 0302  WBC 6.3  --  5.5 5.2  HGB 11.5* 11.6* 11.8* 11.7*  HCT 36.2 34.0* 37.7 35.9*  PLT 196  --  189 203  MCV 95.3  --  95.2 91.6  MCH 30.3  --  29.8 29.8  MCHC 31.8  --  31.3 32.6  RDW 14.1  --  14.0 13.7  LYMPHSABS 2.5  --   --   --   MONOABS 0.4  --   --   --   EOSABS 0.1  --   --   --   BASOSABS 0.0  --   --   --     Chemistries   Recent Labs Lab 07/11/16 1846 07/11/16 1913 07/12/16 0704 07/13/16 0553 07/14/16 0720 07/15/16 0302  NA 139  140 140 142 139 142  K 3.4* 3.5 4.5 3.9 3.9 3.6  CL 101 104 102 103 100* 102  CO2 26  --  '28 27 25 27  ' GLUCOSE 117* 120* 111* 108* 96 107*  BUN 39* 41* 36* 41* 47* 46*  CREATININE 2.28* 2.40* 2.27* 2.84* 2.76* 2.59*  CALCIUM 9.5  --  9.4 9.4 9.6 9.5  MG  --   --  2.6*  --   --   --   AST 24  --   --   --  23 18  ALT 16  --   --   --  13* 11*  ALKPHOS 36*  --   --   --  33* 35*  BILITOT 1.0  --   --   --  0.8 0.7   ------------------------------------------------------------------------------------------------------------------ estimated creatinine clearance is 32.4 mL/min (by C-G formula based on SCr of 2.59 mg/dL (H)). ------------------------------------------------------------------------------------------------------------------ No results for input(s): HGBA1C in the last 72 hours. ------------------------------------------------------------------------------------------------------------------ No results for input(s): CHOL, HDL, LDLCALC, TRIG, CHOLHDL, LDLDIRECT in the last 72 hours. ------------------------------------------------------------------------------------------------------------------ No results for input(s): TSH, T4TOTAL, T3FREE, THYROIDAB in the last 72 hours.  Invalid input(s): FREET3 ------------------------------------------------------------------------------------------------------------------ No results for input(s): VITAMINB12, FOLATE, FERRITIN, TIBC, IRON, RETICCTPCT in the last 72 hours.  Coagulation profile No results for input(s): INR, PROTIME in the last 168 hours.  No results for input(s): DDIMER in the last 72 hours.  Cardiac Enzymes No results for input(s): CKMB, TROPONINI, MYOGLOBIN in the last 168 hours.  Invalid input(s): CK ------------------------------------------------------------------------------------------------------------------ Invalid input(s): POCBNP   CBG:  Recent Labs Lab 07/12/16 0928 07/13/16 2140  GLUCAP 102* 313*        Studies: Ct Abdomen Pelvis Wo Contrast  Result Date: 07/13/2016 CLINICAL DATA:  Mediastinal fibrosis. History of renal transplant, right oophorectomy, retroperitoneal fibrosis, hernia repair and Port-A-Cath placement. Neurogenic bladder with frequent urinary tract infections. EXAM: CT CHEST, ABDOMEN AND PELVIS WITHOUT CONTRAST TECHNIQUE: Multidetector CT imaging of the chest, abdomen and pelvis was performed following the standard protocol without IV contrast. COMPARISON:  CXR from 07/11/2016 fell the left CT abdomen 04/28/2016 FINDINGS: CT CHEST FINDINGS Cardiovascular: Top normal size cardiac chambers without pericardial effusion. No thoracic aortic aneurysm. Main pulmonary artery caliber is top-normal at approximately 2.9 cm. Port catheter noted in the right atrium with motion artifacts from the tip projecting into the right ventricle. Mediastinum/Nodes: No mediastinal adenopathy. No thyromegaly or discrete mass. The trachea and mainstem bronchi are patent. No esophageal abnormality by CT. Small hiatal hernia. Lungs/Pleura: Bibasilar dependent atelectasis. Trace pleural effusions. Mild lower lobe bronchiectasis bilaterally. No pneumonic consolidations. No dominant mass or pneumothorax. Musculoskeletal: Nonacute CT ABDOMEN PELVIS FINDINGS Hepatobiliary: No focal liver abnormality is seen. No gallstones, gallbladder wall thickening, or biliary dilatation. Probable minimal sludge along the dependent aspect of the gallbladder. Pancreas: Unremarkable. No pancreatic ductal dilatation or surrounding inflammatory changes. Spleen:  Normal in size without focal abnormality. Adrenals/Urinary Tract: No acute abnormality of the adrenal glands. Status post right nephrectomy with transplanted left kidney. No obstruction of the transplanted kidney or nephrolithiasis. Neural stimulator noted overlying the left gluteal muscle with leads projecting into the right hemipelvis. Stomach/Bowel: Contracted stomach. Normal  small bowel rotation. No bowel obstruction or acute inflammation. Mild submucosal fat deposition along the right colon. Vascular/Lymphatic: No significant vascular findings are present. No enlarged abdominal or pelvic lymph nodes. No aortic aneurysm. Reproductive: The uterus is unremarkable. The patient is status post right oophorectomy. No left adnexal mass. Other: Postoperative scarring in the right lower quadrant. Retroperitoneal surgical clips as well as right lower quadrant surgical clips presumably from prior nephrectomy. Subcutaneous left lower quadrant fatty induration likely related to injection of subcutaneous medication. Musculoskeletal: Nonacute IMPRESSION: 1. Trace pleural effusions. 2. Port catheter tip noted into the right atrium. Cardiac pulsations may occasionally place tip across the tricuspid into right ventricle. 3. Status post right nephrectomy with transplanted left kidney in the left lower quadrant. No obstructive uropathy. Neurostimulator device for neurogenic bladder noted. 4. Probable mild layering sludge along the dependent wall of the gallbladder. Electronically Signed   By: Ashley Royalty M.D.   On: 07/13/2016 22:42   Ct Chest Wo Contrast  Result Date: 07/13/2016 CLINICAL DATA:  Mediastinal fibrosis. History of renal transplant, right oophorectomy, retroperitoneal fibrosis, hernia repair and Port-A-Cath placement. Neurogenic bladder with frequent urinary tract infections. EXAM: CT CHEST, ABDOMEN AND PELVIS WITHOUT CONTRAST TECHNIQUE: Multidetector CT imaging of the chest, abdomen and pelvis was performed following the standard protocol without IV contrast. COMPARISON:  CXR from 07/11/2016 fell the left CT abdomen 04/28/2016 FINDINGS: CT CHEST FINDINGS Cardiovascular: Top normal size cardiac chambers without pericardial effusion. No thoracic aortic aneurysm. Main pulmonary artery caliber is top-normal at approximately 2.9 cm. Port catheter noted in the right atrium with motion artifacts  from the tip projecting into the right ventricle. Mediastinum/Nodes: No mediastinal adenopathy. No thyromegaly or discrete mass. The trachea and mainstem bronchi are patent. No esophageal abnormality by CT. Small hiatal hernia. Lungs/Pleura: Bibasilar dependent atelectasis. Trace pleural effusions. Mild lower lobe bronchiectasis bilaterally. No pneumonic consolidations. No dominant mass or pneumothorax. Musculoskeletal: Nonacute CT ABDOMEN PELVIS FINDINGS Hepatobiliary: No focal liver abnormality is seen. No gallstones, gallbladder wall thickening, or biliary dilatation. Probable minimal sludge along the dependent aspect of the gallbladder. Pancreas: Unremarkable. No pancreatic ductal dilatation or surrounding inflammatory changes. Spleen: Normal in size without focal abnormality. Adrenals/Urinary Tract: No acute abnormality of the adrenal glands. Status post right nephrectomy with transplanted left kidney. No obstruction of the transplanted kidney or nephrolithiasis. Neural stimulator noted overlying the left gluteal muscle with leads projecting into the right hemipelvis. Stomach/Bowel: Contracted stomach. Normal small bowel rotation. No bowel obstruction or acute inflammation. Mild submucosal fat deposition along the right colon. Vascular/Lymphatic: No significant vascular findings are present. No enlarged abdominal or pelvic lymph nodes. No aortic aneurysm. Reproductive: The uterus is unremarkable. The patient is status post right oophorectomy. No left adnexal mass. Other: Postoperative scarring in the right lower quadrant. Retroperitoneal surgical clips as well as right lower quadrant surgical clips presumably from prior nephrectomy. Subcutaneous left lower quadrant fatty induration likely related to injection of subcutaneous medication. Musculoskeletal: Nonacute IMPRESSION: 1. Trace pleural effusions. 2. Port catheter tip noted into the right atrium. Cardiac pulsations may occasionally place tip across the  tricuspid into right ventricle. 3. Status post right nephrectomy with transplanted left kidney in the left lower quadrant. No obstructive  uropathy. Neurostimulator device for neurogenic bladder noted. 4. Probable mild layering sludge along the dependent wall of the gallbladder. Electronically Signed   By: Ashley Royalty M.D.   On: 07/13/2016 22:42   Dg Fluoro Guide Lumbar Puncture  Result Date: 07/13/2016 CLINICAL DATA:  31 year old female with headache and history of idiopathic intracranial hypertension (pseudotumor cerebri). Diagnostic and therapeutic lumbar puncture under fluoroscopic guidance requested. Initial encounter. EXAM: DIAGNOSTIC LUMBAR PUNCTURE UNDER FLUOROSCOPIC GUIDANCE FLUOROSCOPY TIME:  Fluoroscopy Time:  0 minutes 18 seconds Radiation Exposure Index (if provided by the fluoroscopic device): Number of Acquired Spot Images: 0 PROCEDURE: Informed consent was obtained from the patient prior to the procedure, including potential complications of headache, allergy, and pain. A "time-out" was performed. With the patient prone, the lower back was prepped with Betadine. 1% Lidocaine was used for local anesthesia. Lumbar puncture was performed at the L2-L3 level using a left sub laminar approach, a 3.5 inch x 20 gauge needle with return of clear, colorless CSF with an opening pressure of 22.5 cm water. 16 ml of CSF were obtained for laboratory studies. The needle was withdrawn, direct pressure was held, and hemostasis was noted. The patient tolerated the procedure well and there were no apparent complications. Appropriate post procedural orders were placed on the chart. The patient was returned to the inpatient floor in stable condition for continued treatment. IMPRESSION: Fluoroscopic guided lumbar puncture at L2-L3. Opening pressure of 22.5 cm of water. 16 mL of CSF obtained and sent to the lab for analysis. Electronically Signed   By: Genevie Ann M.D.   On: 07/13/2016 11:52      No results found for:  HGBA1C Lab Results  Component Value Date   CREATININE 2.59 (H) 07/15/2016       Scheduled Meds: . acetaZOLAMIDE  500 mg Oral BID  . allopurinol  100 mg Oral BID  . aspirin  81 mg Oral Daily  . cefTRIAXone (ROCEPHIN)  IV  1 g Intravenous Q24H  . chlorhexidine  15 mL Mouth Rinse BID  . cholecalciferol  1,000 Units Oral Daily  . clonazePAM  0.5 mg Oral QHS  . cycloSPORINE modified  125 mg Oral BID  . famotidine (PEPCID) IV  20 mg Intravenous Q24H  . flavoxATE  100 mg Oral TID  . furosemide  40 mg Oral BID  . gemfibrozil  600 mg Oral Daily  . levETIRAcetam  500 mg Oral BID  . magnesium oxide  800 mg Oral BID  . mouth rinse  15 mL Mouth Rinse q12n4p  . metoprolol  50 mg Oral BID  . mycophenolate  500 mg Oral BID  . omega-3 acid ethyl esters  1 g Oral BID  . pravastatin  40 mg Oral QHS  . predniSONE  2.5 mg Oral Q breakfast  . sertraline  100 mg Oral BID  . sodium chloride flush  3 mL Intravenous Q12H   Continuous Infusions:   LOS: 4 days    Time spent: >30 MINS    [facility]  [facility] Pager [phone]. If 7PM-7AM, please contact night-coverage at [email or web address], password [facility] 07/15/2016, 8:42 AM  LOS: 4 days

## 2016-07-15 NOTE — Discharge Summary (Signed)
Physician Discharge Summary  Donna Shannon MRN: [id number] DOB/AGE: Dec 18, 1985 31 y.o.  PCP: No PCP Per Patient   Admit date: 07/11/2016 Discharge date: 07/15/2016  Discharge Diagnoses:    Principal Problem:   Intractable chronic migraine without aura and with status migrainosus Active Problems:   Renal transplant recipient   Depression   Essential hypertension   Intracranial hypertension   HA (headache)   Chronic renal disease, stage III   Chronic diastolic heart failure (HCC)   OSA (obstructive sleep apnea)   Headache   Intractable headache   Chronic intractable headache   Acute on chronic diastolic heart failure ([place])    Follow-up recommendations Follow-up with PCP in 3-5 days , including all  additional recommended appointments as below Follow-up CBC, CMP in 3-5 days Recommend outpatient MRV to rule out SVC syndrome Arrangements for home BiPAP has been made      Current Discharge Medication List    START taking these medications   Details  cefTRIAXone 1 g in dextrose 5 % 50 mL Inject 1 g into the vein daily. Qty: 5 ampule, Refills: 0    mycophenolate (CELLCEPT) 250 MG capsule Take 2 capsules (500 mg total) by mouth 2 (two) times daily. Qty: 120 capsule, Refills: 2    ondansetron (ZOFRAN) 8 MG tablet Take 0.5 tablets (4 mg total) by mouth every 8 (eight) hours as needed for nausea or vomiting. Qty: 20 tablet, Refills: 0      CONTINUE these medications which have CHANGED   Details  acetaZOLAMIDE (DIAMOX) 250 MG tablet Take 2 tablets (500 mg total) by mouth 2 (two) times daily. Qty: 120 tablet, Refills: 2    cycloSPORINE modified (NEORAL) 25 MG capsule Take 5 capsules (125 mg total) by mouth 2 (two) times daily. Qty: 300 capsule, Refills: 1    furosemide (LASIX) 40 MG tablet Take 1 tablet (40 mg total) by mouth 2 (two) times daily. Qty: 30 tablet, Refills: 1      CONTINUE these medications which have NOT CHANGED   Details  albuterol (PROVENTIL  HFA;VENTOLIN HFA) 108 (90 Base) MCG/ACT inhaler Inhale 2 puffs into the lungs every 6 (six) hours as needed for wheezing or shortness of breath.    allopurinol (ZYLOPRIM) 100 MG tablet Take 100 mg by mouth 2 (two) times daily.    aspirin 81 MG chewable tablet Chew 81 mg by mouth daily.    butalbital-acetaminophen-caffeine (FIORICET, ESGIC) 50-325-40 MG tablet Take 1 tablet by mouth 2 (two) times daily as needed for headache.    Cholecalciferol (VITAMIN D-3) 1000 units CAPS Take 1 capsule by mouth daily.    clonazePAM (KLONOPIN) 0.5 MG tablet Take 0.5 mg by mouth See admin instructions. Take 2 tablets in the morning, then take one in the afternoon, then take 2 tablets at night per patient    fenofibrate 160 MG tablet Take 160 mg by mouth daily.    flavoxATE (URISPAS) 100 MG tablet Take 100 mg by mouth 3 (three) times daily. Take every day per patient    gabapentin (NEURONTIN) 300 MG capsule Take 300 mg by mouth 2 (two) times daily.     gemfibrozil (LOPID) 600 MG tablet Take 600 mg by mouth daily.     levETIRAcetam (KEPPRA) 500 MG tablet Take 500 mg by mouth 2 (two) times daily.    magnesium oxide (MAG-OX) 400 MG tablet Take 800 mg by mouth 2 (two) times daily.    methylphenidate (RITALIN) 10 MG tablet Take 10 mg by mouth 2 (two)  times daily.    metoprolol (LOPRESSOR) 50 MG tablet Take 1 tablet (50 mg total) by mouth 2 (two) times daily. Qty: 60 tablet, Refills: 1    omega-3 acid ethyl esters (LOVAZA) 1 g capsule Take 1 g by mouth 3 (three) times daily.    oxybutynin (DITROPAN) 5 MG tablet Take 1 tablet (5 mg total) by mouth every 8 (eight) hours as needed for bladder spasms. Qty: 20 tablet, Refills: 0    pravastatin (PRAVACHOL) 40 MG tablet Take 40 mg by mouth at bedtime.    predniSONE (DELTASONE) 2.5 MG tablet Take 2.5 mg by mouth daily with breakfast.    ranitidine (ZANTAC) 150 MG tablet Take 150 mg by mouth every evening.    rizatriptan (MAXALT) 10 MG tablet Take 1 tablet  (10 mg total) by mouth daily as needed for migraine. May repeat in 2 hours if needed Qty: 10 tablet, Refills: 6    sertraline (ZOLOFT) 100 MG tablet Take 100 mg by mouth 2 (two) times daily.    traMADol (ULTRAM) 50 MG tablet Take 50 mg by mouth every 6 (six) hours as needed for moderate pain.      STOP taking these medications     prochlorperazine (COMPAZINE) 10 MG tablet      promethazine (PHENERGAN) 25 MG suppository      zolpidem (AMBIEN) 10 MG tablet          Discharge Condition: stable  Discharge Instructions Get Medicines reviewed and adjusted: Please take all your medications with you for your next visit with your Primary MD  Please request your Primary MD to go over all hospital tests and procedure/radiological results at the follow up, please ask your Primary MD to get all Hospital records sent to his/her office.  If you experience worsening of your admission symptoms, develop shortness of breath, life threatening emergency, suicidal or homicidal thoughts you must seek medical attention immediately by calling 911 or calling your MD immediately if symptoms less severe.  You must read complete instructions/literature along with all the possible adverse reactions/side effects for all the Medicines you take and that have been prescribed to you. Take any new Medicines after you have completely understood and accpet all the possible adverse reactions/side effects.   Do not drive when taking Pain medications.   Do not take more than prescribed Pain, Sleep and Anxiety Medications  Special Instructions: If you have smoked or chewed Tobacco in the last 2 yrs please stop smoking, stop any regular Alcohol and or any Recreational drug use.  Wear Seat belts while driving.  Please note  You were cared for by a hospitalist during your hospital stay. Once you are discharged, your primary care physician will handle any further medical issues. Please note that NO REFILLS for any  discharge medications will be authorized once you are discharged, as it is imperative that you return to your primary care physician (or establish a relationship with a primary care physician if you do not have one) for your aftercare needs so that they can reassess your need for medications and monitor your lab values.  Discharge Instructions    Diet - low sodium heart healthy    Complete by:  As directed    Increase activity slowly    Complete by:  As directed        Allergies  Allergen Reactions  . Sulfa Antibiotics Itching      Disposition: 01-Home with [place]  Consultants:  Nephrology  Neurology  Significant Diagnostic Studies:  Ct Abdomen Pelvis Wo Contrast  Result Date: 07/13/2016 CLINICAL DATA:  Mediastinal fibrosis. History of renal transplant, right oophorectomy, retroperitoneal fibrosis, hernia repair and Port-A-Cath placement. Neurogenic bladder with frequent urinary tract infections. EXAM: CT CHEST, ABDOMEN AND PELVIS WITHOUT CONTRAST TECHNIQUE: Multidetector CT imaging of the chest, abdomen and pelvis was performed following the standard protocol without IV contrast. COMPARISON:  CXR from 07/11/2016 fell the left CT abdomen 04/28/2016 FINDINGS: CT CHEST FINDINGS Cardiovascular: Top normal size cardiac chambers without pericardial effusion. No thoracic aortic aneurysm. Main pulmonary artery caliber is top-normal at approximately 2.9 cm. Port catheter noted in the right atrium with motion artifacts from the tip projecting into the right ventricle. Mediastinum/Nodes: No mediastinal adenopathy. No thyromegaly or discrete mass. The trachea and mainstem bronchi are patent. No esophageal abnormality by CT. Small hiatal hernia. Lungs/Pleura: Bibasilar dependent atelectasis. Trace pleural effusions. Mild lower lobe bronchiectasis bilaterally. No pneumonic consolidations. No dominant mass or pneumothorax. Musculoskeletal: Nonacute CT ABDOMEN PELVIS FINDINGS Hepatobiliary: No focal  liver abnormality is seen. No gallstones, gallbladder wall thickening, or biliary dilatation. Probable minimal sludge along the dependent aspect of the gallbladder. Pancreas: Unremarkable. No pancreatic ductal dilatation or surrounding inflammatory changes. Spleen: Normal in size without focal abnormality. Adrenals/Urinary Tract: No acute abnormality of the adrenal glands. Status post right nephrectomy with transplanted left kidney. No obstruction of the transplanted kidney or nephrolithiasis. Neural stimulator noted overlying the left gluteal muscle with leads projecting into the right hemipelvis. Stomach/Bowel: Contracted stomach. Normal small bowel rotation. No bowel obstruction or acute inflammation. Mild submucosal fat deposition along the right colon. Vascular/Lymphatic: No significant vascular findings are present. No enlarged abdominal or pelvic lymph nodes. No aortic aneurysm. Reproductive: The uterus is unremarkable. The patient is status post right oophorectomy. No left adnexal mass. Other: Postoperative scarring in the right lower quadrant. Retroperitoneal surgical clips as well as right lower quadrant surgical clips presumably from prior nephrectomy. Subcutaneous left lower quadrant fatty induration likely related to injection of subcutaneous medication. Musculoskeletal: Nonacute IMPRESSION: 1. Trace pleural effusions. 2. Port catheter tip noted into the right atrium. Cardiac pulsations may occasionally place tip across the tricuspid into right ventricle. 3. Status post right nephrectomy with transplanted left kidney in the left lower quadrant. No obstructive uropathy. Neurostimulator device for neurogenic bladder noted. 4. Probable mild layering sludge along the dependent wall of the gallbladder. Electronically Signed   By: Ashley Royalty M.D.   On: 07/13/2016 22:42   Ct Head Wo Contrast  Result Date: 07/11/2016 CLINICAL DATA:  Headache for 3 months. EXAM: CT HEAD WITHOUT CONTRAST TECHNIQUE: Contiguous  axial images were obtained from the base of the skull through the vertex without intravenous contrast. COMPARISON:  04/29/2016 FINDINGS: Brain: There is no intracranial hemorrhage, mass or evidence of acute infarction. There is no extra-axial fluid collection. Gray matter and white matter appear normal. Cerebral volume is normal for age. Brainstem and posterior fossa are unremarkable. The CSF spaces appear normal. Vascular: No hyperdense vessel or unexpected calcification. Skull: Normal. Negative for fracture or focal lesion. Sinuses/Orbits: No acute finding. Other: None. IMPRESSION: Normal brain Electronically Signed   By: Andreas Newport M.D.   On: 07/11/2016 21:06   Ct Chest Wo Contrast  Result Date: 07/13/2016 CLINICAL DATA:  Mediastinal fibrosis. History of renal transplant, right oophorectomy, retroperitoneal fibrosis, hernia repair and Port-A-Cath placement. Neurogenic bladder with frequent urinary tract infections. EXAM: CT CHEST, ABDOMEN AND PELVIS WITHOUT CONTRAST TECHNIQUE: Multidetector CT imaging of the chest, abdomen and pelvis was  performed following the standard protocol without IV contrast. COMPARISON:  CXR from 07/11/2016 fell the left CT abdomen 04/28/2016 FINDINGS: CT CHEST FINDINGS Cardiovascular: Top normal size cardiac chambers without pericardial effusion. No thoracic aortic aneurysm. Main pulmonary artery caliber is top-normal at approximately 2.9 cm. Port catheter noted in the right atrium with motion artifacts from the tip projecting into the right ventricle. Mediastinum/Nodes: No mediastinal adenopathy. No thyromegaly or discrete mass. The trachea and mainstem bronchi are patent. No esophageal abnormality by CT. Small hiatal hernia. Lungs/Pleura: Bibasilar dependent atelectasis. Trace pleural effusions. Mild lower lobe bronchiectasis bilaterally. No pneumonic consolidations. No dominant mass or pneumothorax. Musculoskeletal: Nonacute CT ABDOMEN PELVIS FINDINGS Hepatobiliary: No focal  liver abnormality is seen. No gallstones, gallbladder wall thickening, or biliary dilatation. Probable minimal sludge along the dependent aspect of the gallbladder. Pancreas: Unremarkable. No pancreatic ductal dilatation or surrounding inflammatory changes. Spleen: Normal in size without focal abnormality. Adrenals/Urinary Tract: No acute abnormality of the adrenal glands. Status post right nephrectomy with transplanted left kidney. No obstruction of the transplanted kidney or nephrolithiasis. Neural stimulator noted overlying the left gluteal muscle with leads projecting into the right hemipelvis. Stomach/Bowel: Contracted stomach. Normal small bowel rotation. No bowel obstruction or acute inflammation. Mild submucosal fat deposition along the right colon. Vascular/Lymphatic: No significant vascular findings are present. No enlarged abdominal or pelvic lymph nodes. No aortic aneurysm. Reproductive: The uterus is unremarkable. The patient is status post right oophorectomy. No left adnexal mass. Other: Postoperative scarring in the right lower quadrant. Retroperitoneal surgical clips as well as right lower quadrant surgical clips presumably from prior nephrectomy. Subcutaneous left lower quadrant fatty induration likely related to injection of subcutaneous medication. Musculoskeletal: Nonacute IMPRESSION: 1. Trace pleural effusions. 2. Port catheter tip noted into the right atrium. Cardiac pulsations may occasionally place tip across the tricuspid into right ventricle. 3. Status post right nephrectomy with transplanted left kidney in the left lower quadrant. No obstructive uropathy. Neurostimulator device for neurogenic bladder noted. 4. Probable mild layering sludge along the dependent wall of the gallbladder. Electronically Signed   By: Ashley Royalty M.D.   On: 07/13/2016 22:42   Mr Brain Wo Contrast  Result Date: 07/06/2016 CLINICAL DATA:  Severe head pressure and blurred vision. Patient has neuro stimulator.  Limited SAR study. EXAM: MRI HEAD WITHOUT CONTRAST TECHNIQUE: Multiplanar, multiecho pulse sequences of the brain and surrounding structures were obtained without intravenous contrast. COMPARISON:  Head CT 04/29/2016 FINDINGS: Brain: The brain has normal appearance without evidence of malformation, atrophy, old or acute small or large vessel infarction, hemorrhage, hydrocephalus or extra-axial collection. No pituitary abnormality. Vascular: Major vessels at the base of the brain show flow. Skull and upper cervical spine: Normal Sinuses/Orbits: Clear/ normal. Other: None significant. IMPRESSION: Normal brain MRI. No cause of the presenting symptoms is identified. Electronically Signed   By: Nelson Chimes M.D.   On: 07/06/2016 19:24   Dg Chest Port 1 View  Result Date: 07/11/2016 CLINICAL DATA:  Edema and migraine headache.  Renal failure. EXAM: PORTABLE CHEST 1 VIEW COMPARISON:  Two-view chest x-ray 04/28/2016 FINDINGS: A right IJ double-lumen Port-A-Cath extends into the right atrium. Heart is enlarged. Mild edema is present. No definite lesions are present. Mild bibasilar airspace disease likely reflects atelectasis. IMPRESSION: 1. Cardiomegaly with mild interstitial edema. 2. No focal airspace disease. 3. Stable right IJ Port-A-Cath. Electronically Signed   By: San Morelle M.D.   On: 07/11/2016 20:15   Mr Mrv Head Wo Cm  Result Date: 07/06/2016 CLINICAL DATA:  Severe head pressure and blurred vision. Patient has neuro stimulator. Limited SAR study EXAM: MR VENOGRAM the HEAD WITHOUT CONTRAST TECHNIQUE: Angiographic images of the intracranial venous structures were obtained using MRV technique without intravenous contrast. COMPARISON:  Head study same day FINDINGS: Superior sagittal sinus is patent. Transverse and sigmoid sinuses are patent. Deep venous system and straight sinus are patent. No thrombosed cortical veins identified. IMPRESSION: Normal intracranial MR venography. Electronically Signed    By: Nelson Chimes M.D.   On: 07/06/2016 19:25   Mr Darnelle Catalan OV Contrast  Result Date: 07/06/2016 CLINICAL DATA:  Severe head pressure and blurred vision. Patient has neuro stimulator. Limited SAR study EXAM: MRI OF THE ORBITS WITHOUT CONTRAST TECHNIQUE: Multiplanar, multisequence MR imaging of the orbits was performed. No intravenous contrast was administered. COMPARISON:  MRI and MRV same day FINDINGS: Orbits: Both globes appear normal. Optic nerves and nerve sheaths appear normal. Extra-ocular muscles are normal. Intraorbital fat is normal. Lacrimal glands are normal. Orbital apices are normal. Cavernous sinuses are normal. Visualized sinuses: Clear and normal Soft tissues: No significant finding Limited intracranial: Negative as on the full study. IMPRESSION: Normal orbital exam. Electronically Signed   By: Nelson Chimes M.D.   On: 07/06/2016 19:27   Dg Fluoro Guide Lumbar Puncture  Result Date: 07/13/2016 CLINICAL DATA:  31 year old female with headache and history of idiopathic intracranial hypertension (pseudotumor cerebri). Diagnostic and therapeutic lumbar puncture under fluoroscopic guidance requested. Initial encounter. EXAM: DIAGNOSTIC LUMBAR PUNCTURE UNDER FLUOROSCOPIC GUIDANCE FLUOROSCOPY TIME:  Fluoroscopy Time:  0 minutes 18 seconds Radiation Exposure Index (if provided by the fluoroscopic device): Number of Acquired Spot Images: 0 PROCEDURE: Informed consent was obtained from the patient prior to the procedure, including potential complications of headache, allergy, and pain. A "time-out" was performed. With the patient prone, the lower back was prepped with Betadine. 1% Lidocaine was used for local anesthesia. Lumbar puncture was performed at the L2-L3 level using a left sub laminar approach, a 3.5 inch x 20 gauge needle with return of clear, colorless CSF with an opening pressure of 22.5 cm water. 16 ml of CSF were obtained for laboratory studies. The needle was withdrawn, direct pressure was  held, and hemostasis was noted. The patient tolerated the procedure well and there were no apparent complications. Appropriate post procedural orders were placed on the chart. The patient was returned to the inpatient floor in stable condition for continued treatment. IMPRESSION: Fluoroscopic guided lumbar puncture at L2-L3. Opening pressure of 22.5 cm of water. 16 mL of CSF obtained and sent to the lab for analysis. Electronically Signed   By: Genevie Ann M.D.   On: 07/13/2016 11:52    echocardiogram     Filed Weights   07/13/16 0500 07/14/16 0500 07/15/16 0313  Weight: 90.3 kg (199 lb 1.2 oz) 90 kg (198 lb 6.6 oz) 89.7 kg (197 lb 12 oz)     Microbiology: Recent Results (from the past 240 hour(s))  MRSA PCR Screening     Status: None   Collection Time: 07/12/16 12:05 PM  Result Value Ref Range Status   MRSA by PCR NEGATIVE NEGATIVE Final    Comment:        The GeneXpert MRSA Assay (FDA approved for NASAL specimens only), is one component of a comprehensive MRSA colonization surveillance program. It is not intended to diagnose MRSA infection nor to guide or monitor treatment for MRSA infections.   Anaerobic culture     Status: None (Preliminary result)   Collection Time: 07/13/16  9:27 AM  Result Value Ref Range Status   Specimen Description CSF  Final   Special Requests NONE  Final   Culture   Final    NO ANAEROBES ISOLATED; CULTURE IN PROGRESS FOR 5 DAYS   Report Status PENDING  Incomplete  CSF culture     Status: None (Preliminary result)   Collection Time: 07/13/16  9:27 AM  Result Value Ref Range Status   Specimen Description CSF  Final   Special Requests NONE  Final   Gram Stain   Final    WBC PRESENT, PREDOMINANTLY MONONUCLEAR NO ORGANISMS SEEN CYTOSPIN SMEAR    Culture NO GROWTH 2 DAYS  Final   Report Status PENDING  Incomplete       Blood Culture    Component Value Date/Time   SDES CSF 07/13/2016 0927   SDES CSF 07/13/2016 0927   SPECREQUEST NONE  07/13/2016 0927   SPECREQUEST NONE 07/13/2016 0927   CULT  07/13/2016 0927    NO ANAEROBES ISOLATED; CULTURE IN PROGRESS FOR 5 DAYS   CULT NO GROWTH 2 DAYS 07/13/2016 0927   REPTSTATUS PENDING 07/13/2016 0927   REPTSTATUS PENDING 07/13/2016 1950      Labs: Results for orders placed or performed during the hospital encounter of 07/11/16 (from the past 48 hour(s))  Glucose, capillary     Status: Abnormal   Collection Time: 07/13/16  9:40 PM  Result Value Ref Range   Glucose-Capillary 313 (H) 65 - 99 mg/dL   Comment 1 Notify RN   Comprehensive metabolic panel     Status: Abnormal   Collection Time: 07/14/16  7:20 AM  Result Value Ref Range   Sodium 139 135 - 145 mmol/L   Potassium 3.9 3.5 - 5.1 mmol/L   Chloride 100 (L) 101 - 111 mmol/L   CO2 25 22 - 32 mmol/L   Glucose, Bld 96 65 - 99 mg/dL   BUN 47 (H) 6 - 20 mg/dL   Creatinine, Ser 2.76 (H) 0.44 - 1.00 mg/dL   Calcium 9.6 8.9 - 10.3 mg/dL   Total Protein 7.2 6.5 - 8.1 g/dL   Albumin 4.1 3.5 - 5.0 g/dL   AST 23 15 - 41 U/L   ALT 13 (L) 14 - 54 U/L   Alkaline Phosphatase 33 (L) 38 - 126 U/L   Total Bilirubin 0.8 0.3 - 1.2 mg/dL   GFR calc non Af Amer 22 (L) >60 mL/min   GFR calc Af Amer 25 (L) >60 mL/min    Comment: (NOTE) The eGFR has been calculated using the CKD EPI equation. This calculation has not been validated in all clinical situations. eGFR's persistently <60 mL/min signify possible Chronic Kidney Disease.    Anion gap 14 5 - 15  Comprehensive metabolic panel     Status: Abnormal   Collection Time: 07/15/16  3:02 AM  Result Value Ref Range   Sodium 142 135 - 145 mmol/L   Potassium 3.6 3.5 - 5.1 mmol/L   Chloride 102 101 - 111 mmol/L   CO2 27 22 - 32 mmol/L   Glucose, Bld 107 (H) 65 - 99 mg/dL   BUN 46 (H) 6 - 20 mg/dL   Creatinine, Ser 2.59 (H) 0.44 - 1.00 mg/dL   Calcium 9.5 8.9 - 10.3 mg/dL   Total Protein 7.3 6.5 - 8.1 g/dL   Albumin 4.1 3.5 - 5.0 g/dL   AST 18 15 - 41 U/L   ALT 11 (L) 14 - 54 U/L    Alkaline  Phosphatase 35 (L) 38 - 126 U/L   Total Bilirubin 0.7 0.3 - 1.2 mg/dL   GFR calc non Af Amer 23 (L) >60 mL/min   GFR calc Af Amer 27 (L) >60 mL/min    Comment: (NOTE) The eGFR has been calculated using the CKD EPI equation. This calculation has not been validated in all clinical situations. eGFR's persistently <60 mL/min signify possible Chronic Kidney Disease.    Anion gap 13 5 - 15  CBC     Status: Abnormal   Collection Time: 07/15/16  3:02 AM  Result Value Ref Range   WBC 5.2 4.0 - 10.5 K/uL   RBC 3.92 3.87 - 5.11 MIL/uL   Hemoglobin 11.7 (L) 12.0 - 15.0 g/dL   HCT 35.9 (L) 36.0 - 46.0 %   MCV 91.6 78.0 - 100.0 fL   MCH 29.8 26.0 - 34.0 pg   MCHC 32.6 30.0 - 36.0 g/dL   RDW 13.7 11.5 - 15.5 %   Platelets 203 150 - 400 K/uL  Blood gas, arterial     Status: Abnormal   Collection Time: 07/15/16 12:47 PM  Result Value Ref Range   FIO2 21.00    pH, Arterial 7.318 (L) 7.350 - 7.450   pCO2 arterial 54.2 (H) 32.0 - 48.0 mmHg   pO2, Arterial 82.8 (L) 83.0 - 108.0 mmHg   Bicarbonate 27.0 20.0 - 28.0 mmol/L   Acid-Base Excess 1.5 0.0 - 2.0 mmol/L   O2 Saturation 95.9 %   Patient temperature 98.6    Collection site LEFT RADIAL    Drawn by [id number]    Sample type ARTERIAL DRAW    Allens test (pass/fail) PASS PASS     Lipid Panel  No results found for: CHOL, TRIG, HDL, CHOLHDL, VLDL, LDLCALC, LDLDIRECT   No results found for: HGBA1C   Lab Results  Component Value Date   CREATININE 2.59 (H) 07/15/2016     HPI   31 y.o.femalewith medical history significant for obesity, hypertension, chronic migraine, idiopathic intracranial hypertension, depression, anxiety, neurogenic bladder, and chronic kidney disease stage III involving her transplant kidney, now presenting to the emergency department at the direction of her neurologist for evaluation of intractable headaches with blurred vision and nausea/vomiting.Neurologist was also consulted by the ED physician and has  evaluated the patient in the emergency department. A medical admission is been advised for further evaluation and management of intractable headache with history of idiopathic intracranial hypertension.   HOSPITAL COURSE:   1. Intractable headache  - Pt presents with her typical headache, but has been progressively worsening over the past month  - Head CT reveals normal brain and no focal neurologic deficits are appreciated on exam  - She has hx of idiopathic intracranial hypertension and reports temporary relief of these same sxs in October 2017 with LP  Neurology following-status post LP, opening pressure not very high , opening pressure 22   apparently has a diagnosis of pseudotumor cerebri . Had some post LP headache During her prior admission, her headache was also addressed and diagnostic lumbar puncture was performed with reported opening pressure of 27, decreasing to 13 after removal of 20 cc CSF. Currently on Diamox 250 mg BID at that time Neurology  concerned about SVC syndrome-not a candidate for invasive venography  given worsening renal function However could consider MRV(without contast) as outpatient -as neurology continues to be concerned about SVC syndrome Patient has declined Port-A-Cath removal-neurology continues to encourage this as a viable treatment option Venous Doppler of  bilateral upper extremities negative   2. Acute on chronic diastolic CHF  - Pt appears to have excess volume on admission  - TTE (04/28/16) with EF 93-81%, grade 2 diastolic dysfunction, mild LVH, and mild TR  - She is managed with Lasix 40 mg IV, metoprolol, and Diamox  Diuretics being titrated by nephrology,       3. CKD stage III; s/p renal transplantation - SCr is 2.76  today ; baseline appears to be ~2  - She is on her second related living donor transplanted kidney from related living donor, this one placed in late 1990's  - Continue anti-rejection medications  - Nephrology managing  diuretics Increase in Creatinine  could be due to bactrimversus UTI versus just clinical scenario Creatinine 2.84>2.76>2.59   4. Hypertension  - BP at goal on admission  - Continue current management with Lopressor and Lasix as tolerated   5. OSA - Continue CPAP qHS Now requiring BiPAP support , ph 7.25, pco2 68  6. Anxiety, insomnia , acute toxic encephalopathy  initially wasOn multiple sedating medication Phenergan, Klonopin, Ambien, Dilaudid Dose of Klonopin has been reduced Received 2 doses of narcan and one dose of flumazenil 07/12/16 around 11 AM Transferred to stepdown on BiPAP due to elevated CO2, pH of 7.25  Now improving, can transfer her back to telemetry  7. 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 Venous doppler to assess for evidence of UE DVT-negative  Invasive contrast venography is the most conclusive diagnostic tool , will discuss options with IR in am  CT chest abdomen pelvis to rule out mediastinal/retroperitoneal fibrosisComment negative   8. Hypercapnic/hypoxic respiratory failure requiring BiPAP support Patient's low PH could be secondary to Diamox, discussed with nephrology Patient needs to be treated primarily based on respiratory symptoms Do not suspect pneumonia  9. UTI-nephrology to make recommendations about further antibiotics and will make arrangements    Discharge Exam: *  Blood pressure 113/80, pulse 91, temperature 98.4 F (36.9 C), temperature source Oral, resp. rate 15, height '5\' 2"'  (1.575 m), weight 89.7 kg (197 lb 12 oz), last menstrual period 06/20/2016, SpO2 96 %.      Follow-up Information    Melvenia Beam, MD. Call.   Specialty:  Neurology Why:  To make appointment, hospital follow-up Contact information: [place] [address] [phone]        Primary care provider. Schedule an appointment as soon as possible for a visit in 2 day(s).   Why:  Hospital follow-up           Signed: Reyne Dumas 07/15/2016, 12:57 PM        Time spent >45 mins

## 2016-07-16 LAB — CSF CULTURE W GRAM STAIN: Culture: NO GROWTH

## 2016-07-16 LAB — VDRL, CSF: SYPHILIS VDRL QUANT CSF: NONREACTIVE

## 2016-07-16 LAB — CSF CULTURE

## 2016-07-16 NOTE — Progress Notes (Signed)
This NCM received a call from patient , stating she was discharged on Sunday and she needs to get her bipap and her iv abx set up.  This NCM spoke with Dr. Susie CassetteAbrol regarding this information.  Per Dr. Susie CassetteAbrol the order is in for the bipap and Dr. Kathrene BongoGoldsborough is setting up patient's iv abx.  Patient states she will get her bip[facility] [place] [facility]  And they state patient will need to have a sleep study set up.  NCM faxed sleep study form over to get it scheduled and patient is scheduled for sleep study on 08/18/15 at 8 pm , they will be mailing information to patient as well. NCM spoke with patient to give her this information.  Also the [facility] insurance person states that  If patient wants to do a ABN for the bipap she can but they can not gurantee that her insurance will pay for it, or she can wait to do the sleep study.  [facility] will call the patient today to explain this to her.

## 2016-07-17 ENCOUNTER — Encounter (HOSPITAL_BASED_OUTPATIENT_CLINIC_OR_DEPARTMENT_OTHER): Payer: Self-pay

## 2016-07-17 DIAGNOSIS — G4733 Obstructive sleep apnea (adult) (pediatric): Secondary | ICD-10-CM

## 2016-07-17 DIAGNOSIS — R0683 Snoring: Secondary | ICD-10-CM

## 2016-07-18 LAB — ANAEROBIC CULTURE

## 2016-08-06 ENCOUNTER — Ambulatory Visit (INDEPENDENT_AMBULATORY_CARE_PROVIDER_SITE_OTHER): Payer: Medicare Other | Admitting: Neurology

## 2016-08-06 ENCOUNTER — Telehealth: Payer: Self-pay | Admitting: Neurology

## 2016-08-06 ENCOUNTER — Encounter: Payer: Self-pay | Admitting: Neurology

## 2016-08-06 VITALS — BP 125/91 | HR 80 | Ht 62.0 in | Wt 198.2 lb

## 2016-08-06 DIAGNOSIS — G932 Benign intracranial hypertension: Secondary | ICD-10-CM

## 2016-08-06 DIAGNOSIS — R0689 Other abnormalities of breathing: Secondary | ICD-10-CM | POA: Diagnosis not present

## 2016-08-06 DIAGNOSIS — G4730 Sleep apnea, unspecified: Secondary | ICD-10-CM

## 2016-08-06 DIAGNOSIS — E669 Obesity, unspecified: Secondary | ICD-10-CM | POA: Diagnosis not present

## 2016-08-06 NOTE — Progress Notes (Signed)
[facility]    Provider:  Dr Lucia GaskinsAhern Referring Provider: No ref. provider found Primary Care Physician:  No PCP Per Patient  [facility]    Provider:  Dr Lucia GaskinsAhern Referring Provider: Annie SableGoldsborough, [name], MD Primary Care Physician:  No PCP Per Patient  CC:  Papilledema, elevated intracranial pressure  Interval history 08/06/2016: Donna Shannon is a 31 y.o. female here as a referral from Dr. Kathrene BongoGoldsborough for papilledema and increased intracranial pressure with intractable headaches. She has a PMHx of retroperitoneal fibrosis and renal transplants, obesity, chronic kidney disease, neurogenic bladder, hypertension, depression, hyperlipidemia, migraines, on home oxygen therapy, and chronic back pain who was admitted 04/24/2016 with concern for volume overload and a 40 pound weight gain in 2 months after starting Lyrica.  LP opening pressure was 29 and the headache was relieved after LP and she was placed on acetazolamide. She was admitted again earlier this month with worsening headache and hypercapnea. Repeat LP showed decreased opening pressure likely due to acetazolamide treatment and MRI/MV were normal. 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 She was medically managed while inpatient and started on Bipap at night and her headaches are again improved.   She feels better she just has a lot of nausea. Her headache is improved, she gets dull ones every now and then. She is using bipap at night. [facility] is [facility] sleep [facility] February 9th. She is following up with christopher groat. Her vision is better, no blurry vision. She started a walking regimen with one of her friends. She can;t walk fast without getting winded. She has gained a lot of weight. Discussed the [facility] and will place a referral for her. In the hospital her CO2 was 60 and she was treated with  bipap and she feels better.    HPI:  Donna Shannon is a 31 y.o. female here as a referral from Dr. Kathrene BongoGoldsborough for papilledema and increased intracranial pressure with intractable headaches. She has a PMHx of retroperitoneal fibrosis and renal transplants, obesity, chronic kidney disease, neurogenic bladder, hypertension, depression, hyperlipidemia, migraines, on home oxygen therapy, and chronic back pain who was admitted 04/24/2016 with concern for volume overload and a 40 pound weight gain in 2 months after starting Lyrica. She reported dyspnea on exertion, pneumonia, increased use of inhaler on admission. She is on CellCept, prednisone and cyclosporine. She has had worsening migraines. She was given Depakote and Imitrex and CPAP while inpatient. Patient reported that headache started several days prior to admission with severe nausea and vomiting. The headaches were different than her normal migraines. She felt pressure all around the head, blurry vision, worse when laying flat, lots of pressure and throbbing, she was also hearing swishing and a heartbeat in her ear, lots of nausea and vomiting and was be a 10 out of 10 in pain. Lumbar puncture was performed in the hospital with an opening pressure which was elevated at 29.  Patient's headache improved after lumbar puncture and she was started on Diamox. CT scan of the head was performed and patient but MRI imaging was not. She was discharged with instructions for follow-up outpatient MRI, MRV as well as sleep evaluation and ophthalmologic follow-up for papilledema. Patient was discharged at the end of October and returns today. She was unable to have any of this work[place]. She continues to have daily headaches. She is using her father's CPAP machine  which I highly discouraged as this can cause harm to her lungs if the CPAP is not calibrated properly. She reports that she cannot sleep without his CPAP at this point and she wakes with  morning severe headaches. She has pulsating, throbbing headache, both sides of the head, pressure. She had a sleep test 4 years ago and diagnosed with OSA but has not followed up with sleep doctor and wearing her father's cpap at this time. Headaches are every day. Wearing the cpap helps, advised not to use other people's cpaps because can cause lung damage. Headaches are daily and can be a 7/10 in pain, she has blurry vision and he vision is changing. They can last all day. Her parents are here and provide more information and patient does. At next visit I will advise that patient's comes in alone for the first 30 minutes.  Reviewed notes, labs and imaging from outside physicians, which showed:   LP 04/29/2016: The patient was prepped and draped, and using sterile technique a 20 gauge quinke spinal needle was inserted in the L3-L4 space. The opening pressure was 29 cm H2O. Approximately 20cc of CSF were obtained and sent for analysis. Closing pressure was 13 cm H2O   CT head 04/29/2016: Personally reviewed images and agree with the following  FINDINGS: Brain: No intracranial hemorrhage or CT evidence of large acute infarct.  Mild atrophy without hydrocephalus.  No intracranial mass lesion noted on this unenhanced exam.  Vascular: No hyperdense vessel to indicate acute infarct or dural sinus thrombosis. If this is of high clinical concern MR imaging could be performed.  Skull: No skull base abnormality.  Sinuses/Orbits: No acute orbital abnormality. Mild exophthalmos. Visualized sinuses and mastoid air cells are clear.  Other: Negative  IMPRESSION: No intracranial hemorrhage or CT evidence of large acute infarct.  No hyperdense vessel to suggest dural sinus thrombosis. If this is of high clinical concern, MR imaging may then be considered.  Mild atrophy.  Mild exophthalmos. Review of Systems: Patient complains of symptoms per HPI as well as the following  symptoms: no CP. [name] Pertinent negatives per HPI. All others negative.    Social History   Social History  . Marital status: Single    Spouse name: N/A  . Number of children: N/A  . Years of education: N/A   Occupational History  . school nurse    Social History Main Topics  . Smoking status: Former Smoker    Packs/day: 0.50    Years: 4.00    Types: Cigarettes    Quit date: 07/09/2014  . Smokeless tobacco: Never Used  . Alcohol use Yes     Comment: 04/24/2016 "might have 2 drinks/month, if that"  . Drug use: No  . Sexual activity: No   Other Topics Concern  . Not on file   Social History Narrative   Lives mother and father   Caffeine use: 1 cup soda/day       Family History  Problem Relation Age of Onset  . CAD Father     s/p CABG    Past Medical History:  Diagnosis Date  . Anxiety   . Arthritis    "left knee" (04/24/2016)  . Chronic lower back pain   . Depression   . Essential hypertension   . Factor V Leiden (HCC)    [name] 04/24/2016  . Frequent UTI    [name] 04/24/2016  . Gout   . High cholesterol   . Migraine    "weekly" (04/24/2016)  .  Neurogenic bladder    augmented bladder, I/O self caths  . On home oxygen therapy    "2L; every night" (04/24/2016)  . Ovarian cyst dx'd 04/22/2016   left  . Pneumonia 03/2016   [name] 04/24/2016  . Renal transplant failure and rejection    age 39-11  . 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 Self-catheterizes urinary bladder    "~ 12 X/day" (04/24/2016)  . Stroke [facility]) 1991   denies residual on 04/24/2016    Past Surgical History:  Procedure Laterality Date  . ABDOMINAL HERNIA REPAIR  "several; when I was little"  . allograph biopsy  2013   [name] 04/24/2016  . HERNIA REPAIR    . KIDNEY TRANSPLANT  1996; 1998   father was donor; mother was donor/notes 04/24/2016  . PACEMAKER INSERTION  "?early 2000s"   for bladder function  . PARTIAL NEPHRECTOMY   1990s X 5   "removing disease"  . PORTACATH PLACEMENT Right 11/2015  . RIGHT OOPHORECTOMY Right 2001   ovarian torsion    Current Outpatient Prescriptions  Medication Sig Dispense Refill  . acetaZOLAMIDE (DIAMOX) 250 MG tablet Take 2 tablets (500 mg total) by mouth 2 (two) times daily. 120 tablet 2  . albuterol (PROVENTIL HFA;VENTOLIN HFA) 108 (90 Base) MCG/ACT inhaler Inhale 2 puffs into the lungs every 6 (six) hours as needed for wheezing or shortness of breath.    . allopurinol (ZYLOPRIM) 100 MG tablet Take 100 mg by mouth 2 (two) times daily.    [name] aspirin 81 MG chewable tablet Chew 81 mg by mouth daily.    . butalbital-acetaminophen-caffeine (FIORICET, ESGIC) 50-325-40 MG tablet Take 1 tablet by mouth 2 (two) times daily as needed for headache.    . Cholecalciferol (VITAMIN D-3) 1000 units CAPS Take 1 capsule by mouth daily.    . clonazePAM (KLONOPIN) 0.5 MG tablet Take 0.5 mg by mouth See admin instructions. Take 2 tablets in the morning, then take one in the afternoon, then take 2 tablets at night per patient    . cycloSPORINE modified (NEORAL) 25 MG capsule Take 5 capsules (125 mg total) by mouth 2 (two) times daily. 300 capsule 1  . famotidine (PEPCID) 20 MG tablet Take 1 tablet (20 mg total) by mouth daily. 30 tablet 2  . fenofibrate 160 MG tablet Take 160 mg by mouth daily.    . flavoxATE (URISPAS) 100 MG tablet Take 100 mg by mouth 3 (three) times daily. Take every day per patient    . furosemide (LASIX) 40 MG tablet Take 1 tablet (40 mg total) by mouth 2 (two) times daily. 30 tablet 1  . gabapentin (NEURONTIN) 300 MG capsule Take 300 mg by mouth 2 (two) times daily.     [name] gemfibrozil (LOPID) 600 MG tablet Take 600 mg by mouth daily.     [name] levETIRAcetam (KEPPRA) 500 MG tablet Take 500 mg by mouth 2 (two) times daily.    . magnesium oxide (MAG-OX) 400 MG tablet Take 800 mg by mouth 2 (two) times daily.    . methylphenidate (RITALIN) 10 MG tablet Take 10 mg by mouth 2 (two) times  daily.    . metoprolol (LOPRESSOR) 50 MG tablet Take 1 tablet (50 mg total) by mouth 2 (two) times daily. 60 tablet 1  . mycophenolate (CELLCEPT) 250 MG capsule Take 2 capsules (500 mg total) by mouth 2 (two) times daily. 120 capsule 2  . omega-3 acid  ethyl esters (LOVAZA) 1 g capsule Take 1 g by mouth 3 (three) times daily.    . ondansetron (ZOFRAN) 8 MG tablet Take 0.5 tablets (4 mg total) by mouth every 8 (eight) hours as needed for nausea or vomiting. 20 tablet 0  . pravastatin (PRAVACHOL) 40 MG tablet Take 40 mg by mouth at bedtime.    . predniSONE (DELTASONE) 2.5 MG tablet Take 2.5 mg by mouth daily with breakfast.    . ranitidine (ZANTAC) 150 MG tablet Take 150 mg by mouth every evening.    . rizatriptan (MAXALT) 10 MG tablet Take 1 tablet (10 mg total) by mouth daily as needed for migraine. May repeat in 2 hours if needed 10 tablet 6  . sertraline (ZOLOFT) 100 MG tablet Take 100 mg by mouth 2 (two) times daily.    . traMADol (ULTRAM) 50 MG tablet Take 50 mg by mouth 2 (two) times daily.      No current facility-administered medications for this visit.     Allergies as of 08/06/2016 - Review Complete 08/06/2016  Allergen Reaction Noted  . Sulfa antibiotics Itching 06/25/2016    Vitals: BP (!) 125/91 (BP Location: Right Arm, Patient Position: Sitting, Cuff Size: Large)   Pulse 80   Ht 5\' 2"  (1.575 m)   Wt 198 lb 3.2 oz (89.9 kg)   BMI 36.25 kg/m  Last Weight:  Wt Readings from Last 1 Encounters:  08/06/16 198 lb 3.2 oz (89.9 kg)   Last Height:   Ht Readings from Last 1 Encounters:  08/06/16 5\' 2"  (1.575 m)       Physical exam: Exam: Gen: NAD, conversant, well nourised, obese, well groomed                     CV: RRR, no MRG. No Carotid Bruits. No peripheral edema, warm, nontender Eyes: Conjunctivae clear without exudates or hemorrhage  Neuro: Detailed Neurologic Exam  Speech:    Speech is normal; fluent and spontaneous with normal comprehension.  Cognition:     The patient is oriented to person, place, and time;     recent and remote memory intact;     language fluent;     normal attention, concentration,     fund of knowledge Cranial Nerves:    The pupils are equal, round, and reactive to light. Fundoscopic exam normal. Visual fields are full to finger confrontation. Extraocular movements are intact. Trigeminal sensation is intact and the muscles of mastication are normal. The face is symmetric. The palate elevates in the midline. Hearing intact. Voice is normal. Shoulder shrug is normal. The tongue has normal motion without fasciculations.   Coordination:    Normal finger to nose and heel to shin. Normal rapid alternating movements.   Gait:    Heel-toe and tandem gait are normal.   Motor Observation:    No asymmetry, no atrophy, and no involuntary movements noted. Tone:    Normal muscle tone.    Posture:    Posture is normal. normal erect    Strength:    Strength is V/V in the upper and lower limbs.      Sensation: intact to LT     Reflex Exam:  DTR's:    Deep tendon reflexes in the upper and lower extremities are normal bilaterally.   Toes:    The toes are downgoing bilaterally.   Clonus:    Clonus is absent.      Assessment/Plan:    Donna Shannon is a  31 y.o. female here as a referral from Dr. Kathrene Bongo with intractable headaches. She has a PMHx of retroperitoneal fibrosis and renal transplants, obesity, chronic kidney disease, neurogenic bladder, hypertension, depression, hyperlipidemia, migraines, on home oxygen therapy and bipap, and chronic back pain who was admitted 04/24/2016 with concern for volume overload and a 40 pound weight gain in 2 months after starting Lyrica.   - Patient with vision loss, intractable headache, increased opening pressure on LP was 29 continue acetazolamide - Obesity contributing to her headache disorder and possible to her hypercapnia (OSA vs Obesity Hypoventilation syndrome or  other lung disease). She has a sleep study scheduled. Continue Bipap. [facility] Dr[facility] Dalbert Garnet as weight loss is critical for this patient.  - Ophthalmology: Had vision changes, decreased peripheral changes and has a follow up scheduled with Dr. Dione Booze. - Continue Acetazolamide   Discussed: To prevent or relieve headaches, try the following:  Cool Compress. Lie down and place a cool compress on your head.   Avoid headache triggers. If certain foods or odors seem to have triggered your migraines in the past, avoid them. A headache diary might help you identify triggers.   Include physical activity in your daily routine. Try a daily walk or other moderate aerobic exercise.   Manage stress. Find healthy ways to cope with the stressors, such as delegating tasks on your to-do list.   Practice relaxation techniques. Try deep breathing, yoga, massage and visualization.   Eat regularly. Eating regularly scheduled meals and maintaining a healthy diet might help prevent headaches. Also, drink plenty of fluids.   Follow a regular sleep schedule. Sleep deprivation might contribute to headaches  Consider biofeedback. With this mind-body technique, you learn to control certain bodily functions - such as muscle tension, heart rate and blood pressure - to prevent headaches or reduce headache pain.    Proceed to emergency room if you experience new or worsening symptoms or symptoms do not resolve, if you have new neurologic symptoms or if headache is severe, or for any concerning symptom.   A total of 45 minutes was spent in with this patient. Over half this time was spent on counseling patient on the IIH, sleep apnea, hypercapnia, obesity diagnosis and different therapeutic options available.

## 2016-08-06 NOTE — Telephone Encounter (Signed)
Dana, I have placed a referral to the [facility] for this patient. I spoke to Savannah today an dinformed her a referral will be coming, please call and ensure Savannah has seen referral thanks [phone] 

## 2016-08-06 NOTE — Patient Instructions (Signed)
Remember to drink plenty of fluid, eat healthy meals and do not skip any meals. Try to eat protein with a every meal and eat a healthy snack such as fruit or nuts in between meals. Try to keep a regular sleep-wake schedule and try to exercise daily, particularly in the form of walking, 20-30 minutes a day, if you can.   As far as your medications are concerned, I would like to suggest: Continue acetazolamide  I would like to see you back in 3 months, sooner if we need to. Please call us with any interim questions, concerns, problems, updates or refill requests.   Our phone number is [phone]. We also have an after hours call service for urgent matters and there is a physician on-call for urgent questions. For any emergencies you know to call 911 or go to the nearest emergency room

## 2016-08-07 NOTE — Telephone Encounter (Signed)
Noted Donna SanesSavannah has.

## 2016-08-14 ENCOUNTER — Encounter (INDEPENDENT_AMBULATORY_CARE_PROVIDER_SITE_OTHER): Payer: Medicare Other | Admitting: Family Medicine

## 2016-08-15 ENCOUNTER — Ambulatory Visit (INDEPENDENT_AMBULATORY_CARE_PROVIDER_SITE_OTHER): Payer: Medicare Other | Admitting: Neurology

## 2016-08-15 DIAGNOSIS — N135 Crossing vessel and stricture of ureter without hydronephrosis: Secondary | ICD-10-CM

## 2016-08-15 DIAGNOSIS — G43711 Chronic migraine without aura, intractable, with status migrainosus: Secondary | ICD-10-CM

## 2016-08-15 DIAGNOSIS — R0902 Hypoxemia: Secondary | ICD-10-CM

## 2016-08-15 DIAGNOSIS — G44221 Chronic tension-type headache, intractable: Secondary | ICD-10-CM

## 2016-08-15 DIAGNOSIS — G4733 Obstructive sleep apnea (adult) (pediatric): Secondary | ICD-10-CM

## 2016-08-17 ENCOUNTER — Encounter (HOSPITAL_BASED_OUTPATIENT_CLINIC_OR_DEPARTMENT_OTHER): Payer: Medicare Other

## 2016-08-22 ENCOUNTER — Encounter (INDEPENDENT_AMBULATORY_CARE_PROVIDER_SITE_OTHER): Payer: Self-pay | Admitting: Family Medicine

## 2016-08-22 ENCOUNTER — Telehealth (INDEPENDENT_AMBULATORY_CARE_PROVIDER_SITE_OTHER): Payer: Self-pay | Admitting: Family Medicine

## 2016-08-22 ENCOUNTER — Ambulatory Visit (INDEPENDENT_AMBULATORY_CARE_PROVIDER_SITE_OTHER): Payer: Medicare Other | Admitting: Family Medicine

## 2016-08-22 VITALS — BP 110/76 | HR 85 | Temp 99.1°F | Resp 20 | Ht 62.0 in | Wt 193.0 lb

## 2016-08-22 DIAGNOSIS — E669 Obesity, unspecified: Secondary | ICD-10-CM

## 2016-08-22 DIAGNOSIS — Z1389 Encounter for screening for other disorder: Secondary | ICD-10-CM | POA: Diagnosis not present

## 2016-08-22 DIAGNOSIS — Z1331 Encounter for screening for depression: Secondary | ICD-10-CM

## 2016-08-22 DIAGNOSIS — E559 Vitamin D deficiency, unspecified: Secondary | ICD-10-CM

## 2016-08-22 DIAGNOSIS — Z9189 Other specified personal risk factors, not elsewhere classified: Secondary | ICD-10-CM

## 2016-08-22 DIAGNOSIS — Z6835 Body mass index (BMI) 35.0-35.9, adult: Secondary | ICD-10-CM

## 2016-08-22 DIAGNOSIS — Z0289 Encounter for other administrative examinations: Secondary | ICD-10-CM

## 2016-08-22 DIAGNOSIS — R0602 Shortness of breath: Secondary | ICD-10-CM | POA: Diagnosis not present

## 2016-08-22 DIAGNOSIS — Z8249 Family history of ischemic heart disease and other diseases of the circulatory system: Secondary | ICD-10-CM | POA: Diagnosis not present

## 2016-08-22 DIAGNOSIS — R5383 Other fatigue: Secondary | ICD-10-CM | POA: Diagnosis not present

## 2016-08-22 DIAGNOSIS — R739 Hyperglycemia, unspecified: Secondary | ICD-10-CM | POA: Diagnosis not present

## 2016-08-22 NOTE — Telephone Encounter (Signed)
Pt returned April's call. Requesting a call back. Thank you

## 2016-08-22 NOTE — Progress Notes (Signed)
Office: [phone]  /  Fax: [phone]   HPI:   Chief Complaint: OBESITY  Donna Shannon (MR# [id number]) is a 31 y.o. female who presents on 08/22/2016 for obesity evaluation and treatment. Current BMI is Body mass index is 35.3 kg/m.Donna Shannon Donna Shannon has struggled with obesity for years and has been unsuccessful in either losing weight or maintaining long term weight loss. Donna Shannon attended our information session and states she is currently in the action stage of change and ready to dedicate time achieving and maintaining a healthier weight.  Donna Shannon has multiple health issues, many revolving around her renal disease and status post renal transplants (x2). Donna Shannon has undergone 50+ surgeries and has significant depression. She lives with her parents, who likely will not eat healthy with her. Donna Shannon states her family eats meals together she thinks her family will eat healthier with  her her desired weight is 140 she has been heavy most of  her life she started gaining weight when she turned 28 her heaviest weight ever was 199 lbs. she has significant food cravings issues  she snacks frequently in the evenings she skips meals frequently she is frequently drinking liquids with calories she frequently makes poor food choices she frequently eats larger portions than normal  she has binge eating behaviors she struggles with emotional eating    Fatigue Donna Shannon feels her energy is lower than it should be. This has worsened with weight gain and has not worsened recently. [name] admits to daytime somnolence and  denies waking up still tired. Patient is at risk for obstructive sleep apnea. Patent has a history of symptoms of daytime fatigue. Patient generally gets 9 hours of sleep per night, and states they generally have generally restful sleep. Snoring is not present. Apneic episodes are present. Epworth Sleepiness Score is 7  Dyspnea on exertion Donna Shannon notes increasing shortness  of breath with exercising and seems to be worsening over time with weight gain. She notes getting out of breath sooner with activity than she used to. This has not gotten worse recently. [name] denies orthopnea.  Vitamin D deficiency Donna Shannon has a diagnosis of vitamin D deficiency. She is currently taking vit D and denies nausea, vomiting or muscle weakness.  Hyperglycemia Donna Shannon had fasting glucose of 107 and admits polyphagia  Positive Family History of Coronary Artery Disease Donna Shannon has a positive family history of coronary artery disease in father. She denies chest pain and admits shortness of breath with multiple health issues.  At risk for cardiovascular disease Donna Shannon is at a higher than average risk for cardiovascular disease due to obesity. She currently denies any chest pain.  Depression Screen Donna Shannon's Food and Mood (modified PHQ-9) score was  Depression screen PHQ 2/9 08/22/2016  Decreased Interest 2  Down, Depressed, Hopeless 1  PHQ - 2 Score 3  Altered sleeping 1  Tired, decreased energy 2  Change in appetite 1  Feeling bad or failure about yourself  0  Trouble concentrating 1  Moving slowly or fidgety/restless 1  Suicidal thoughts 0  PHQ-9 Score 9    ALLERGIES: Allergies  Allergen Reactions   Sulfa Antibiotics Itching    MEDICATIONS: Current Outpatient Prescriptions on File Prior to Visit  Medication Sig Dispense Refill   aspirin 81 MG chewable tablet Chew 81 mg by mouth daily.     butalbital-acetaminophen-caffeine (FIORICET, ESGIC) 50-325-40 MG tablet Take 1 tablet by mouth 2 (two) times daily as needed for headache.     Cholecalciferol (VITAMIN D-3) 1000 units CAPS Take 1  capsule by mouth daily.     clonazePAM (KLONOPIN) 0.5 MG tablet Take 0.5 mg by mouth See admin instructions. Take 2 tablets in the morning, then take one in the afternoon, then take 2 tablets at night per patient     fenofibrate 160 MG tablet Take 160 mg by mouth  daily.     flavoxATE (URISPAS) 100 MG tablet Take 100 mg by mouth 3 (three) times daily. Take every day per patient     gabapentin (NEURONTIN) 300 MG capsule Take 300 mg by mouth 2 (two) times daily.      magnesium oxide (MAG-OX) 400 MG tablet Take 800 mg by mouth 2 (two) times daily.     methylphenidate (RITALIN) 10 MG tablet Take 10 mg by mouth 2 (two) times daily.     metoprolol (LOPRESSOR) 50 MG tablet Take 1 tablet (50 mg total) by mouth 2 (two) times daily. 60 tablet 1   omega-3 acid ethyl esters (LOVAZA) 1 g capsule Take 1 g by mouth 3 (three) times daily.     ondansetron (ZOFRAN) 8 MG tablet Take 0.5 tablets (4 mg total) by mouth every 8 (eight) hours as needed for nausea or vomiting. 20 tablet 0   pravastatin (PRAVACHOL) 40 MG tablet Take 40 mg by mouth at bedtime.     predniSONE (DELTASONE) 2.5 MG tablet Take 2.5 mg by mouth daily with breakfast.     rizatriptan (MAXALT) 10 MG tablet Take 1 tablet (10 mg total) by mouth daily as needed for migraine. May repeat in 2 hours if needed 10 tablet 6   sertraline (ZOLOFT) 100 MG tablet Take 100 mg by mouth 2 (two) times daily.     traMADol (ULTRAM) 50 MG tablet Take 50 mg by mouth 2 (two) times daily.      acetaZOLAMIDE (DIAMOX) 250 MG tablet Take 2 tablets (500 mg total) by mouth 2 (two) times daily. 120 tablet 2   furosemide (LASIX) 40 MG tablet Take 1 tablet (40 mg total) by mouth 2 (two) times daily. 30 tablet 1   No current facility-administered medications on file prior to visit.     PAST MEDICAL HISTORY: Past Medical History:  Diagnosis Date   Anemia    Anxiety    Arthritis    "left knee" (04/24/2016)   Back pain    Chronic lower back pain    Constipation    Depression    Edema    Essential hypertension    Factor V Leiden (HCC)    Donna Shannon 04/24/2016   Frequent UTI    /notes 04/24/2016   GERD (gastroesophageal reflux disease)    Gout    High cholesterol    HTN (hypertension)    Kidney  disease    Migraine    "weekly" (04/24/2016)   Neurogenic bladder    augmented bladder, I/O self caths   On home oxygen therapy    "2L; every night" (04/24/2016)   Osteoporosis    Ovarian cyst dx'd 04/22/2016   left   Palpitations    Pneumonia 03/2016   Donna Shannon 04/24/2016   Renal transplant failure and rejection    age 27-11   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   Self-catheterizes urinary bladder    "~ 12 X/day" (04/24/2016)   SOB (shortness of breath)    Stroke (HCC) 1991   denies residual on 04/24/2016   Vitamin D deficiency     PAST SURGICAL HISTORY: Past  Surgical History:  Procedure Laterality Date   ABDOMINAL HERNIA REPAIR  "several; when I was little"   allograph biopsy  2013   /notes 04/24/2016   HERNIA REPAIR     KIDNEY TRANSPLANT  1996; 1998   father was donor; mother was donor/notes 04/24/2016   PACEMAKER INSERTION  "?early 2000s"   for bladder function   PARTIAL NEPHRECTOMY  1990s X 5   "removing disease"   PORTACATH PLACEMENT Right 11/2015   RIGHT OOPHORECTOMY Right 2001   ovarian torsion    SOCIAL HISTORY: Social History  Substance Use Topics   Smoking status: Former Smoker    Packs/day: 0.50    Years: 4.00    Types: Cigarettes    Quit date: 07/09/2014   Smokeless tobacco: Never Used   Alcohol use Yes     Comment: 04/24/2016 "might have 2 drinks/month, if that"    FAMILY HISTORY: Family History  Problem Relation Age of Onset   Hypertension Mother    CAD Father     s/p CABG   Hyperlipidemia Father    Heart disease Father    Thyroid disease Father     ROS: Review of Systems  Constitutional: Positive for malaise/fatigue.  HENT:       Decreased hearing Nasal Discharge  Respiratory: Positive for shortness of breath.   Cardiovascular: Negative for chest pain and orthopnea.       Shortness of Breath with Activity  Gastrointestinal: Positive for nausea.  Negative for vomiting.  Genitourinary: Positive for frequency.  Musculoskeletal: Positive for back pain.       Negative Muscle Weakness  Neurological: Positive for tremors and headaches.  Endo/Heme/Allergies:       Polyphagia  Psychiatric/Behavioral: Positive for depression. The patient is nervous/anxious.        Stress    PHYSICAL EXAM: Blood pressure 110/76, pulse 85, temperature 99.1 F (37.3 C), temperature source Oral, resp. rate 20, height 5\' 2"  (1.575 m), weight 193 lb (87.5 kg), last menstrual period 05/21/2016, SpO2 97 %. Body mass index is 35.3 kg/m. Physical Exam  Constitutional: She is oriented to person, place, and time.  HENT:  M[place]aces  Cardiovascular: Normal rate.   Pulmonary/Chest: Effort normal.  Musculoskeletal: Normal range of motion. She exhibits edema (1+ edema bilateral lower extremities).  Neurological: She is oriented to person, place, and time.  Skin: Skin is warm and dry.  Vitals reviewed.   RECENT LABS AND TESTS: BMET    Component Value Date/Time   NA 142 07/15/2016 0302   NA 140 06/21/2016 1623   K 3.6 07/15/2016 0302   CL 102 07/15/2016 0302   CO2 27 07/15/2016 0302   GLUCOSE 107 (H) 07/15/2016 0302   BUN 46 (H) 07/15/2016 0302   BUN 36 (H) 06/21/2016 1623   CREATININE 2.59 (H) 07/15/2016 0302   CALCIUM 9.5 07/15/2016 0302   GFRNONAA 23 (L) 07/15/2016 0302   GFRAA 27 (L) 07/15/2016 0302   No results found for: HGBA1C No results found for: INSULIN CBC    Component Value Date/Time   WBC 5.2 07/15/2016 0302   RBC 3.92 07/15/2016 0302   HGB 11.7 (L) 07/15/2016 0302   HCT 35.9 (L) 07/15/2016 0302   HCT 34.4 06/21/2016 1623   PLT 203 07/15/2016 0302   PLT 228 06/21/2016 1623   MCV 91.6 07/15/2016 0302   MCV 89 06/21/2016 1623   MCH 29.8 07/15/2016 0302   MCHC 32.6 07/15/2016 0302   RDW 13.7 07/15/2016 0302   RDW 15.0  06/21/2016 1623   LYMPHSABS 2.5 07/11/2016 1846   MONOABS 0.4 07/11/2016 1846   EOSABS 0.1 07/11/2016 1846    BASOSABS 0.0 07/11/2016 1846   Iron/TIBC/Ferritin/ %Sat No results found for: IRON, TIBC, FERRITIN, IRONPCTSAT Lipid Panel  No results found for: CHOL, TRIG, HDL, CHOLHDL, VLDL, LDLCALC, LDLDIRECT Hepatic Function Panel     Component Value Date/Time   PROT 7.3 07/15/2016 0302   PROT 7.1 06/21/2016 1623   ALBUMIN 4.1 07/15/2016 0302   ALBUMIN 4.5 06/21/2016 1623   AST 18 07/15/2016 0302   ALT 11 (L) 07/15/2016 0302   ALKPHOS 35 (L) 07/15/2016 0302   BILITOT 0.7 07/15/2016 0302   BILITOT 0.3 06/21/2016 1623      Component Value Date/Time   TSH 3.890 04/25/2016 0157    ECG  shows NSR with a rate of 83 BPM INDIRECT CALORIMETER done today shows a VO2 of 267 and a REE of 1857.    ASSESSMENT AND PLAN: Depression screening  Other fatigue - Plan: EKG 12-Lead, CBC With Differential, Comprehensive metabolic panel, Insulin, random, T3, TSH, T4, free  Shortness of breath on exertion  Vitamin D deficiency  Hyperglycemia - Plan: Hemoglobin A1c  Family history of coronary artery disease  At risk for heart disease - Plan: Lipid Panel With LDL/HDL Ratio  Class 2 obesity without serious comorbidity with body mass index (BMI) of 35.0 to 35.9 in adult, unspecified obesity type  PLAN:  Fatigue Chezney was informed that her fatigue may be related to obesity, depression or many other causes. Labs will be ordered, and in the meanwhile Kyley has agreed to work on diet, exercise and weight loss to help with fatigue. Proper sleep hygiene was discussed including the need for 7-8 hours of quality sleep each night. A sleep study was not ordered based on symptoms and Epworth score.  Dyspnea on exertion Xian's shortness of breath appears to be obesity related and exercise induced. She has agreed to work on weight loss and gradually increase exercise to treat her exercise induced shortness of breath. If Harumi follows our instructions and loses weight without improvement of her  shortness of breath, we will plan to refer to pulmonology. We will monitor this condition regularly. Kasidy agrees to this plan.  Vitamin D Deficiency Mossie was informed that low vitamin D levels contributes to fatigue and are associated with obesity, breast, and colon cancer. She agrees to continue to take prescription Vit D @50 ,000 IU every week and will follow up for routine testing of vitamin D, at least 2-3 times per year. She was informed of the risk of over-replacement of vitamin D and agrees to not increase her dose unless he discusses this with [place] first.  Hyperglycemia We will re-check labs and Nasha has agreed to work on diet, exercise and weight loss to help with hyperglycemia and will follow up with our clinic in 2 weeks.  Positive Family History of Coronary Artery Disease Idy was given cardiac prevention counseling today. We will re-check labs and Laveah agrees to work on diet and exercise and will follow up in 2 weeks.  Cardiovascular risk counselling Tymeshia was given extended (at least 15 minutes) coronary artery disease prevention counseling today. She is 31 y.o. female and has risk factors for heart disease including obesity. We discussed intensive lifestyle modifications today with an emphasis on specific weight loss instructions and strategies. Pt was also informed of the importance of increasing exercise and decreasing saturated fats to help prevent heart disease.  Depression Screen  Anniah had a positive depression screening. Depression is commonly associated with obesity and often results in emotional eating behaviors. We will monitor this closely and work on CBT to help improve the non-hunger eating patterns. Referral to Psychology may be required if no improvement is seen as she continues in our clinic.  Obesity Misk is currently in the action stage of change and her goal is to continue with weight loss efforts She has agreed to follow the  Category 2 plan +100 calories Marybell has been instructed to work up to a goal of 150 minutes of combined cardio and strengthening exercise per week for weight loss and overall health benefits. We discussed the following Behavioral Modification Stratagies today: increasing lean protein intake, decreasing simple carbohydrates, not skipping meals and dealing with family or coworker sabotage  Toniann has agreed to follow up with our clinic in 2 weeks. She was informed of the importance of frequent follow up visits to maximize her success with intensive lifestyle modifications for her multiple health conditions. She was informed we would discuss her lab results at her next visit unless there is a critical issue that needs to be addressed sooner. Kayliegh agreed to keep her next visit at the agreed upon time to discuss these results.  I, [place] Crane, am acting as scribe for Quillian Quince, MD  I have reviewed the above documentation for accuracy and completeness, and I agree with the above. -Quillian Quince, MD

## 2016-08-22 NOTE — Telephone Encounter (Signed)
Spoke with the patient and was given the name of her cardiologist in [place]. Will reach out to them for any recent blood work that was performed for our records.

## 2016-08-23 ENCOUNTER — Encounter (INDEPENDENT_AMBULATORY_CARE_PROVIDER_SITE_OTHER): Payer: Self-pay

## 2016-08-23 LAB — COMPREHENSIVE METABOLIC PANEL
ALT: 24 IU/L (ref 0–32)
AST: 37 IU/L (ref 0–40)
Albumin/Globulin Ratio: 1.7 (ref 1.2–2.2)
Albumin: 4.5 g/dL (ref 3.5–5.5)
Alkaline Phosphatase: 41 IU/L (ref 39–117)
BUN/Creatinine Ratio: 16 (ref 9–23)
BUN: 39 mg/dL — AB (ref 6–20)
Bilirubin Total: 0.5 mg/dL (ref 0.0–1.2)
CO2: 23 mmol/L (ref 18–29)
Calcium: 9.5 mg/dL (ref 8.7–10.2)
Chloride: 102 mmol/L (ref 96–106)
Creatinine, Ser: 2.37 mg/dL — ABNORMAL HIGH (ref 0.57–1.00)
GFR calc non Af Amer: 26 mL/min/{1.73_m2} — ABNORMAL LOW (ref >59)
GFR, EST AFRICAN AMERICAN: 30 mL/min/{1.73_m2} — AB (ref >59)
GLUCOSE: 102 mg/dL — AB (ref 65–99)
Globulin, Total: 2.7 g/dL (ref 1.5–4.5)
Potassium: 3.9 mmol/L (ref 3.5–5.2)
Sodium: 142 mmol/L (ref 134–144)
TOTAL PROTEIN: 7.2 g/dL (ref 6.0–8.5)

## 2016-08-23 LAB — CBC WITH DIFFERENTIAL
BASOS ABS: 0 10*3/uL (ref 0.0–0.2)
Basos: 0 %
EOS (ABSOLUTE): 0.1 10*3/uL (ref 0.0–0.4)
EOS: 2 %
HEMOGLOBIN: 10.7 g/dL — AB (ref 11.1–15.9)
Hematocrit: 33 % — ABNORMAL LOW (ref 34.0–46.6)
IMMATURE GRANS (ABS): 0 10*3/uL (ref 0.0–0.1)
IMMATURE GRANULOCYTES: 0 %
LYMPHS ABS: 1.9 10*3/uL (ref 0.7–3.1)
Lymphs: 34 %
MCH: 29.7 pg (ref 26.6–33.0)
MCHC: 32.4 g/dL (ref 31.5–35.7)
MCV: 92 fL (ref 79–97)
MONOCYTES: 10 %
Monocytes Absolute: 0.6 10*3/uL (ref 0.1–0.9)
NEUTROS ABS: 3.1 10*3/uL (ref 1.4–7.0)
Neutrophils: 54 %
RBC: 3.6 x10E6/uL — ABNORMAL LOW (ref 3.77–5.28)
RDW: 14.5 % (ref 12.3–15.4)
WBC: 5.8 10*3/uL (ref 3.4–10.8)

## 2016-08-23 LAB — HEMOGLOBIN A1C
Est. average glucose Bld gHb Est-mCnc: 100 mg/dL
Hgb A1c MFr Bld: 5.1 % (ref 4.8–5.6)

## 2016-08-23 LAB — T3: T3, Total: 125 ng/dL (ref 71–180)

## 2016-08-23 LAB — TSH: TSH: 0.019 u[IU]/mL — AB (ref 0.450–4.500)

## 2016-08-23 LAB — T4, FREE: Free T4: 1.79 ng/dL — ABNORMAL HIGH (ref 0.82–1.77)

## 2016-08-23 LAB — INSULIN, RANDOM: INSULIN: 42.5 u[IU]/mL — ABNORMAL HIGH (ref 2.6–24.9)

## 2016-08-24 ENCOUNTER — Telehealth: Payer: Self-pay | Admitting: Neurology

## 2016-08-24 DIAGNOSIS — G4733 Obstructive sleep apnea (adult) (pediatric): Secondary | ICD-10-CM

## 2016-08-24 DIAGNOSIS — Z9981 Dependence on supplemental oxygen: Secondary | ICD-10-CM

## 2016-08-24 DIAGNOSIS — R0902 Hypoxemia: Secondary | ICD-10-CM

## 2016-08-24 NOTE — Procedures (Signed)
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 She had sleep study many years ago but was not put on CPAP at the time, presumed that she did not have apnea. She was later placed on a CPAP during a hospitalization and noted her chronic migraines were relieved.  She has a very extensive medical history and current medical issues: she got her first renal transplant 20 years ago but rejected it after a year and received a second transplant (living donor) organ 19 years ago. This kidney has done very well. Her current creatinine is lower than 2. She has retro- peritoneal fibrosis, morbid obesity, fatigue and excessive sleepiness, snoring, has a neurogenic bladder, hypertension, depression, hyperlipidemia, and is on home oxygen therapy. She has chronic back pain. Caffeine user.   The patient endorsed the Epworth Sleepiness Scale at 11 points   The patient's weight 205 pounds with a height of 62 (inches), resulting in a BMI of 37.7 kg/m2. The patient's neck circumference measured 19 inches.  CURRENT MEDICATIONS: Diamox, Proventil, Zyloprim, Aspirin, Fioricet, Klonopin, Neoral, Urispas, Lasix, Neurontin, Lopiramate, Viscera, Keppra, Ritalin, Lopressor, Lovaza, Pravachol, Phenergan, Zantac, Maxalt, Zoloft, Forteo, Ultram, Ambien.  ON OXYGEN AT NIGHT     PROCEDURE:  This is a multichannel digital polysomnogram utilizing the Somnostar 11.2 system.  Electrodes and sensors were applied and monitored per AASM Specifications.   EEG, EOG, Chin and Limb EMG, were sampled at 200 Hz.  ECG, Snore and Nasal Pressure, Thermal Airflow, Respiratory Effort, CPAP Flow  and Pressure, Oximetry was sampled at 50 Hz. Digital video and audio were recorded.      BASELINE STUDY WITHOUT CPAP RESULTS:  Lights Out was at 21:10 and Lights On at 05:11.  Total recording time (TRT) was 253, with a total sleep time (TST) of 222.5 minutes.   The patient's sleep latency was 7.5 minutes. The sleep efficiency was 87.9 %.    SLEEP ARCHITECTURE: WASO (Wake after sleep onset) was 10.5 minutes, Stage N1 was 20.5 minutes, Stage N2 was 108 minutes, Stage N3 was 94 minutes and Stage R (REM sleep) was 0 minutes.  The percentages were Stage N1 9.2%, Stage N2 48.5%, Stage N3 42.2% and Stage R (REM sleep) 0%.   RESPIRATORY ANALYSIS:  There were 181 respiratory events:  2 obstructive apneas, 0 central apneas and 179 hypopneas with 0 respiratory event related arousals (RERAs).  The total APNEA/HYPOPNEA INDEX (AHI) was 48.8 /hour and the total RESPIRATORY DISTURBANCE INDEX was 48.8 /hour, all events in NREM. The patients spent all sleep time in the supine position.   OXYGEN SATURATION & C02:  The wake baseline 02 saturation was 94%, with the lowest being 80%. Time spent below 89% saturation equaled 168 minutes.  PERIODIC LIMB MOVEMENTS:   The patient had a total of 0 Periodic Limb Movements.  The arousals were noted as: 21 were spontaneous, 0 were associated with PLMs, and 18 were associated with respiratory events. Audio and video analysis did not show any abnormal or unusual movements, behaviors, but loud snoring and groaning was noted EKG was in keeping with normal sinus rhythm (NSR)  TITRATION STUDY WITH  CPAP RESULTS:   CPAP was initiated at 5 cmH20 with heated humidity per AASM split night standards and pressure was advanced to 7 cmH20 because of hypopneas, apneas and desaturations.  At a PAP pressure of 7 cmH20, there was a reduction of the AHI to 0.6 /hour.   Total recording time (TRT) was 228.5 minutes, with a total sleep time (TST) of 219.5 minutes. The patient's sleep latency was  2.5 minutes. REM latency was 124 minutes.  The sleep efficiency was 96.1 %.    SLEEP ARCHITECTURE: Wake after sleep was 5 minutes, Stage N1 7 minutes, Stage N2 142.5 minutes, Stage N3 46.5 minutes and Stage R (REM sleep) 23.5 minutes. The percentages were: Stage N1 3.2%, Stage N2 64.9%, Stage N3 21.2% and Stage R (REM sleep) 10.7%. The sleep architecture was notable for REM sleep rebound.  The arousals were noted as: 13 were spontaneous, 2 were associated with PLMs, 0 were associated with respiratory events.  RESPIRATORY ANALYSIS:  There was 1 respiratory event: 0 apneas and 1 hypopnea with 0 respiratory event related arousals (RERAs).     The total APNEA/HYPOPNEA INDEX (AHI) was 0.3 /hour and the total RESPIRATORY DISTURBANCE INDEX was 0.3 /hour.  1 event occurred in REM sleep and 0 events in NREM. The REM AHI was 2.6 /hour versus a non-REM AHI of 0.0 /hour. REM sleep was achieved on a pressure of 7 cm water.  The patient spent 100% of total sleep time in the supine position. The supine AHI was 0.3 /hour, versus a non-supine AHI of 0.0/hour.  OXYGEN SATURATION & C02:  The wake baseline 02 saturation was 93%, with the lowest being 83%. Time spent below 89% saturation equaled 7 minutes.  PERIODIC LIMB MOVEMENTS:    The patient had a total of 15 Periodic Limb Movements. The Periodic Limb Movement (PLM) index was 4.1 /hour and the PLM Arousal index was 0.5 /hour.      POLYSOMNOGRAPHY IMPRESSION :   1. Severe Obstructive Sleep Apnea(OSA) and Hypoxemia,   2. Loud, Primary Snoring 3. Sleep Related Groaning (Catathrenia) 4. Repetitive Intrusions of Sleep, Nocturia times one. 5. Patient responded to CPAP at 7 cm water with almost complete resolution of apnea and hypoxemia, no additional oxygen was used.     RECOMMENDATIONS:  1. CPAP to be initiated at 8 cm water pressure, with heated humidity and used with an AirFit P 10 nasal pillow in small size. Additional oxygen was not needed.  2. Note that  patients with congestive heart failure (CHF), significant lung disease such as chronic obstructive pulmonary disease (COPD), patients expected to have nocturnal arterial oxyhemoglobin desaturation due to conditions other than OSA (e.g. obesity hypoventilation syndrome), patients who do not snore (either naturally or as a result of palate surgery), and patients who have central sleep apnea syndromes are not currently candidates for APAP titration or treatment.   3. Compliance to PAP therapy should be emphasized as a minimum user time of 4 hours each night. .  Compliance, AHI and air leak information to be downloaded for objective assessment at 30 days, 180 days and annually thereafter.   4. There were frequent periodic limb movements of sleep (PLMS) noted, but these were not associated with sleep disruption. PLMs persisted during REM sleep. 5. A follow up appointment will be scheduled in the [facility] Clinic at [facility].      I certify that I have reviewed the entire raw data recording prior to the issuance of this report in accordance  with the Standards of Accreditation of the American Academy of Sleep Medicine (AASM)    Melvyn Novas, M.D.  08-24-2016  Diplomat of the American Board of Psychiatry and Neurology  Diplomat of the American Board of Sleep Medicine Medical Director of [facility] at [facility], an AASM accredited facility  Dr. Lucia Gaskins, Dr. Kathrene Bongo.

## 2016-08-24 NOTE — Telephone Encounter (Signed)
Donna Shannon , please call and inform patient about results.  POLYSOMNOGRAPHY IMPRESSION :   1. Severe Obstructive Sleep Apnea(OSA) and Hypoxemia,   2. Loud, Primary Snoring 3. Sleep Related Groaning (Catathrenia) 4. Repetitive Intrusions of Sleep, Nocturia times one. 5. Patient responded to CPAP at 7 cm water with almost complete resolution of apnea and hypoxemia, no additional oxygen was used.     RECOMMENDATIONS:  1. CPAP to be initiated at 8 cm water pressure, with heated humidity and used with an AirFit P 10 nasal pillow in small size. Additional oxygen was not needed.  2. Note that patients with congestive heart failure (CHF), significant lung disease such as chronic obstructive pulmonary disease (COPD), patients expected to have nocturnal arterial oxyhemoglobin desaturation due to conditions other than OSA (e.g. obesity hypoventilation syndrome), patients who do not snore (either naturally or as a result of palate surgery), and patients who have central sleep apnea syndromes are not currently candidates for APAP titration or treatment.   3. Compliance to PAP therapy should be emphasized as a minimum user time of 4 hours each night. .  Compliance, AHI and air leak information to be downloaded for objective assessment at 30 days, 180 days and annually thereafter.   4. There were frequent periodic limb movements of sleep (PLMS) noted, but these were not associated with sleep disruption. PLMs persisted during REM sleep. 5. A follow up appointment will be scheduled in the Sleep Clinic at [facility].      I certify that I have reviewed the entire raw data recording prior to the issuance of this report in accordance with the Standards of Accreditation of the American Academy of Sleep Medicine (AASM)    Melvyn Novasarmen Anyjah Roundtree, M.D.  08-24-2016  Diplomat of the American Board of Psychiatry and Neurology  Diplomat of the American Board of Sleep Medicine Medical Director of [facility]  at [facility], an AASM accredited facility  Dr. Lucia GaskinsAhern, Dr. Kathrene BongoGoldsborough.

## 2016-08-28 NOTE — Telephone Encounter (Signed)
-----   Message from Melvyn Novasarmen Dohmeier, MD sent at 08/24/2016  9:40 AM EST ----- POLYSOMNOGRAPHY IMPRESSION :   1. Severe Obstructive Sleep Apnea(OSA) and Hypoxemia,   2. Loud, Primary Snoring 3. Sleep Related Groaning (Catathrenia) 4. Repetitive Intrusions of Sleep, Nocturia times one. 5. Patient responded to CPAP at 7 cm water with almost complete resolution of apnea and hypoxemia, no additional oxygen was used.     RECOMMENDATIONS:  1. CPAP to be initiated at 8 cm water pressure, with heated humidity and used with an AirFit P 10 nasal pillow in small size. Additional oxygen was not needed.  2. Note that patients with congestive heart failure (CHF), significant lung disease such as chronic obstructive pulmonary disease (COPD), patients expected to have nocturnal arterial oxyhemoglobin desaturation due to conditions other than OSA (e.g. obesity hypoventilation syndrome), patients who do not snore (either naturally or as a result of palate surgery), and patients who have central sleep apnea syndromes are not currently candidates for APAP titration or treatment.   3. Compliance to PAP therapy should be emphasized as a minimum user time of 4 hours each night. .  Compliance, AHI and air leak information to be downloaded for objective assessment at 30 days, 180 days and annually thereafter.   4. There were frequent periodic limb movements of sleep (PLMS) noted, but these were not associated with sleep disruption. PLMs persisted during REM sleep. 5. A follow up appointment will be [facility].      I certify that I have reviewed the entire raw data recording prior to the issuance of this report in accordance with the Standards of Accreditation of the American Academy of Sleep Medicine (AASM)    Melvyn Novasarmen Dohmeier, M.D.  08-24-2016  Diplomat of the American Board of Psychiatry and Neurology  Diplomat of the American Board of Sleep Medicine Medical  Director of [facility] at [facility], an AASM accredited facility  Dr. Lucia GaskinsAhern, Dr. Kathrene BongoGoldsborough.

## 2016-08-28 NOTE — Telephone Encounter (Signed)
Patient is returning your call and can be reached at [phone] and ask for the nurse.

## 2016-08-28 NOTE — Telephone Encounter (Signed)
LM for patient to call back for sleep study results.  

## 2016-08-28 NOTE — Telephone Encounter (Signed)
I called patient and she is aware of results and recommendations. She currently has BiPAP machine, I advised her that I will send new orders to her current DME, [facility]. Report sent to PCP.

## 2016-09-10 ENCOUNTER — Ambulatory Visit (INDEPENDENT_AMBULATORY_CARE_PROVIDER_SITE_OTHER): Payer: Medicare Other | Admitting: Family Medicine

## 2016-09-10 VITALS — BP 101/68 | HR 86 | Temp 99.2°F | Ht 62.0 in | Wt 180.0 lb

## 2016-09-10 DIAGNOSIS — E161 Other hypoglycemia: Secondary | ICD-10-CM | POA: Insufficient documentation

## 2016-09-10 DIAGNOSIS — E559 Vitamin D deficiency, unspecified: Secondary | ICD-10-CM | POA: Diagnosis not present

## 2016-09-10 DIAGNOSIS — N183 Chronic kidney disease, stage 3 unspecified: Secondary | ICD-10-CM

## 2016-09-10 DIAGNOSIS — Z9189 Other specified personal risk factors, not elsewhere classified: Secondary | ICD-10-CM | POA: Diagnosis not present

## 2016-09-10 DIAGNOSIS — E669 Obesity, unspecified: Secondary | ICD-10-CM | POA: Diagnosis not present

## 2016-09-10 MED ORDER — VITAMIN D-3 25 MCG (1000 UT) PO CAPS: 1.0000 | ORAL_CAPSULE | Freq: Every day | ORAL | 0 refills | Status: DC

## 2016-09-11 NOTE — Progress Notes (Signed)
Office: [phone]  /  Fax: [phone]   HPI:   Chief Complaint: OBESITY Donna Shannon is here to discuss her progress with her obesity treatment plan. She is following her eating plan approximately 95 % of the time and states she is exercising 0 minutes 0 times per week. Donna Shannon has done well with wight loss, she liked most of the food, she felt dinner volume was too much. She felt her hunger was controlled but still struggled with cravings for chips.  Her weight is 180 lb (81.6 kg) today and has had a weight loss of 13 pounds over a period of 2 to 3 weeks since her last visit. She has lost 13 lbs since starting treatment with us.  Vitamin D deficiency Donna Shannon has a diagnosis of vitamin D deficiency. She was put on vitamin D 1,000 IU daily and is doing well. She admits fatigue and denies nausea, vomiting or muscle weakness. Vitamin D was ordered  but not done due to insurance issues.  Stage III End Stage Renal Disease Donna Shannon is status post kidney transplant and is on immunosuppression which is affecting her weight. She is being very controlled about her diet to lose weight safely and improve her renal function. She has been eating around 1200 to 1400 calories per day with about 65 grams of protein, decreased sodium and decreased simple carbohydrates. She is doing well.  Hyperinsulinemia Donna Shannon has an elevated fasting glucose, normal Hgb A1c but elevated fasting insulin level. She notes polyphagia. She is on steroids chronically for her renal transplant which is likely affecting her insulin sensitivity.   Wt Readings from Last 500 Encounters:  09/10/16 180 lb (81.6 kg)  08/22/16 193 lb (87.5 kg)  08/06/16 198 lb 3.2 oz (89.9 kg)  07/15/16 197 lb 12 oz (89.7 kg)  06/25/16 204 lb (92.5 kg)  06/21/16 210 lb 12.8 oz (95.6 kg)  04/30/16 216 lb 14.9 oz (98.4 kg)     ALLERGIES: Allergies  Allergen Reactions  . Sulfa Antibiotics Itching    MEDICATIONS: Current Outpatient  Prescriptions on File Prior to Visit  Medication Sig Dispense Refill  . aspirin 81 MG chewable tablet Chew 81 mg by mouth daily.    . butalbital-acetaminophen-caffeine (FIORICET, ESGIC) 50-325-40 MG tablet Take 1 tablet by mouth 2 (two) times daily as needed for headache.    . clonazePAM (KLONOPIN) 0.5 MG tablet Take 0.5 mg by mouth See admin instructions. Take 2 tablets in the morning, then take one in the afternoon, then take 2 tablets at night per patient    . cycloSPORINE (SANDIMMUNE) 100 MG capsule Take 100 mg by mouth 2 (two) times daily.    . cycloSPORINE (SANDIMMUNE) 25 MG capsule Take 25 mg by mouth every morning.    . fenofibrate 160 MG tablet Take 160 mg by mouth daily.    . flavoxATE (URISPAS) 100 MG tablet Take 100 mg by mouth 3 (three) times daily. Take every day per patient    . gabapentin (NEURONTIN) 300 MG capsule Take 300 mg by mouth 2 (two) times daily.     . magnesium oxide (MAG-OX) 400 MG tablet Take 800 mg by mouth 2 (two) times daily.    . methylphenidate (RITALIN) 10 MG tablet Take 10 mg by mouth 2 (two) times daily.    . metoprolol (LOPRESSOR) 50 MG tablet Take 1 tablet (50 mg total) by mouth 2 (two) times daily. 60 tablet 1  . mirabegron ER (MYRBETRIQ) 50 MG TB24 tablet Take 50 mg by mouth  at bedtime.    . mycophenolate (CELLCEPT) 500 MG tablet Take 500 mg by mouth 2 (two) times daily.    [name] omega-3 acid ethyl esters (LOVAZA) 1 g capsule Take 1 g by mouth 3 (three) times daily.    . ondansetron (ZOFRAN) 8 MG tablet Take 0.5 tablets (4 mg total) by mouth every 8 (eight) hours as needed for nausea or vomiting. 20 tablet 0  . pravastatin (PRAVACHOL) 40 MG tablet Take 40 mg by mouth at bedtime.    . predniSONE (DELTASONE) 2.5 MG tablet Take 2.5 mg by mouth daily with breakfast.    . rizatriptan (MAXALT) 10 MG tablet Take 1 tablet (10 mg total) by mouth daily as needed for migraine. May repeat in 2 hours if needed 10 tablet 6  . sertraline (ZOLOFT) 100 MG tablet Take 100 mg by  mouth 2 (two) times daily.    . traMADol (ULTRAM) 50 MG tablet Take 50 mg by mouth 2 (two) times daily.     [name] acetaZOLAMIDE (DIAMOX) 250 MG tablet Take 2 tablets (500 mg total) by mouth 2 (two) times daily. 120 tablet 2  . furosemide (LASIX) 40 MG tablet Take 1 tablet (40 mg total) by mouth 2 (two) times daily. 30 tablet 1   No current facility-administered medications on file prior to visit.     PAST MEDICAL HISTORY: Past Medical History:  Diagnosis Date  . Anemia   . Anxiety   . Arthritis    "left knee" (04/24/2016)  . Back pain   . Chronic lower back pain   . Constipation   . Depression   . Edema   . Essential hypertension   . Factor V Leiden (HCC)    [name] 04/24/2016  . Frequent UTI    [name] 04/24/2016  . GERD (gastroesophageal reflux disease)   . Gout   . High cholesterol   . HTN (hypertension)   . Kidney disease   . Migraine    "weekly" (04/24/2016)  . Neurogenic bladder    augmented bladder, I/O self caths  . On home oxygen therapy    "2L; every night" (04/24/2016)  . Osteoporosis   . Ovarian cyst dx'd 04/22/2016   left  . Palpitations   . Pneumonia 03/2016   [name] 04/24/2016  . Renal transplant failure and rejection    age 71-11  . 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 Self-catheterizes urinary bladder    "~ 12 X/day" (04/24/2016)  . SOB (shortness of breath)   . Stroke [employer]) 1991   denies residual on 04/24/2016  . Vitamin D deficiency     PAST SURGICAL HISTORY: Past Surgical History:  Procedure Laterality Date  . ABDOMINAL HERNIA REPAIR  "several; when I was little"  . allograph biopsy  2013   [name] 04/24/2016  . HERNIA REPAIR    . KIDNEY TRANSPLANT  1996; 1998   father was donor; mother was donor/notes 04/24/2016  . PACEMAKER INSERTION  "?early 2000s"   for bladder function  . PARTIAL NEPHRECTOMY  1990s X 5   "removing disease"  . PORTACATH PLACEMENT Right 11/2015  . RIGHT  OOPHORECTOMY Right 2001   ovarian torsion    SOCIAL HISTORY: Social History  Substance Use Topics  . Smoking status: Former Smoker    Packs/day: 0.50    Years: 4.00    Types: Cigarettes    Quit date: 07/09/2014  . Smokeless tobacco: Never Used  . Alcohol  use Yes     Comment: 04/24/2016 "might have 2 drinks/month, if that"    FAMILY HISTORY: Family History  Problem Relation Age of Onset  . Hypertension Mother   . CAD Father     s/p CABG  . Hyperlipidemia Father   . Heart disease Father   . Thyroid disease Father     ROS: Review of Systems  Constitutional: Positive for malaise/fatigue and weight loss.  Gastrointestinal: Negative for nausea and vomiting.  Musculoskeletal:       Negative muscle weakness  Endo/Heme/Allergies:       Polyphagia    PHYSICAL EXAM: Blood pressure 101/68, pulse 86, temperature 99.2 F (37.3 C), temperature source Oral, height 5\' 2"  (1.575 m), weight 180 lb (81.6 kg), last menstrual period 09/03/2016, SpO2 97 %. Body mass index is 32.92 kg/m. Physical Exam  Constitutional: She is oriented to person, place, and time. She appears well-developed and well-nourished.  Cardiovascular: Normal rate.   Pulmonary/Chest: Effort normal.  Musculoskeletal: Normal range of motion.  Neurological: She is oriented to person, place, and time.  Skin: Skin is warm and dry.  Psychiatric: She has a normal mood and affect. Her behavior is normal.  Vitals reviewed.   RECENT LABS AND TESTS: BMET    Component Value Date/Time   NA 142 08/22/2016 1440   K 3.9 08/22/2016 1440   CL 102 08/22/2016 1440   CO2 23 08/22/2016 1440   GLUCOSE 102 (H) 08/22/2016 1440   GLUCOSE 107 (H) 07/15/2016 0302   BUN 39 (H) 08/22/2016 1440   CREATININE 2.37 (H) 08/22/2016 1440   CALCIUM 9.5 08/22/2016 1440   GFRNONAA 26 (L) 08/22/2016 1440   GFRAA 30 (L) 08/22/2016 1440   Lab Results  Component Value Date   HGBA1C 5.1 08/22/2016   Lab Results  Component Value Date    INSULIN 42.5 (H) 08/22/2016   CBC    Component Value Date/Time   WBC 5.8 08/22/2016 1440   WBC 5.2 07/15/2016 0302   RBC 3.60 (L) 08/22/2016 1440   RBC 3.92 07/15/2016 0302   HGB 11.7 (L) 07/15/2016 0302   HCT 33.0 (L) 08/22/2016 1440   PLT 203 07/15/2016 0302   PLT 228 06/21/2016 1623   MCV 92 08/22/2016 1440   MCH 29.7 08/22/2016 1440   MCH 29.8 07/15/2016 0302   MCHC 32.4 08/22/2016 1440   MCHC 32.6 07/15/2016 0302   RDW 14.5 08/22/2016 1440   LYMPHSABS 1.9 08/22/2016 1440   MONOABS 0.4 07/11/2016 1846   EOSABS 0.1 08/22/2016 1440   BASOSABS 0.0 08/22/2016 1440   Iron/TIBC/Ferritin/ %Sat No results found for: IRON, TIBC, FERRITIN, IRONPCTSAT Lipid Panel  No results found for: CHOL, TRIG, HDL, CHOLHDL, VLDL, LDLCALC, LDLDIRECT Hepatic Function Panel     Component Value Date/Time   PROT 7.2 08/22/2016 1440   ALBUMIN 4.5 08/22/2016 1440   AST 37 08/22/2016 1440   ALT 24 08/22/2016 1440   ALKPHOS 41 08/22/2016 1440   BILITOT 0.5 08/22/2016 1440      Component Value Date/Time   TSH 0.019 (L) 08/22/2016 1440   TSH 3.890 04/25/2016 0157    ASSESSMENT AND PLAN: Vitamin D deficiency - Plan: Cholecalciferol (VITAMIN D-3) 1000 units CAPS  Hyperinsulinemia  Chronic renal disease, stage III  At risk for diabetes mellitus  Obesity (BMI 30.0-34.9)  PLAN:  Vitamin D Deficiency [place] was informed that low vitamin D levels contributes to fatigue and are associated with obesity, breast, and colon cancer. She agrees to continue to take  OTC Vit D @1 ,000 IU every day and we will re-check labs in 3 months and will follow up for routine testing of vitamin D, at least 2-3 times per year. She was informed of the risk of over-replacement of vitamin D and agrees to not increase her dose unless he discusses this with [place] first.  Stage III End Stage Renal Disease We discussed diet in depth to help improve her current health and decreased risk of future complications. Darshay  will continue with diet and exercise and weight loss to help with her renal function and we will continue to follow with lab results.  Hyperinsulinemia We discussed this lab result in depth and how we will decrease simple carbohydrates to decrease chance of developing diabetes. We also discussed how her renal function could be making her retain her insulin levels for longer than average. Tammey will continue her lower but steady carbohydrate diet with moderately lean protein and moderate fat intake. We will re-check labs in 3 months and She agreed to follow up in our office in 2 weeks.  Obesity Arhianna is currently in the action stage of change. As such, her goal is to continue with weight loss efforts She has agreed to keep a food journal with 300 to 500 calories and 20 to 30 grams of protein at supper daily and follow the Category 2 plan Wallace has been instructed to work up to a goal of 150 minutes of combined cardio and strengthening exercise per week or 10 to 15 minutes of cardio daily for weight loss and overall health benefits. We discussed the following Behavioral Modification Stratagies today: decreasing simple carbohydrates , increasing lower sugar fruits, increasing fiber rich foods and work on meal planning and easy cooking plans  Amita has agreed to follow up with our clinic in 2 weeks. She was informed of the importance of frequent follow up visits to maximize her success with intensive lifestyle modifications for her multiple health conditions.  I, [place] Crane, am acting as scribe for Quillian Quince, MD  I have reviewed the above documentation for accuracy and completeness, and I agree with the above. -Quillian Quince, MD

## 2016-09-24 ENCOUNTER — Encounter: Payer: Self-pay | Admitting: Neurology

## 2016-09-24 ENCOUNTER — Ambulatory Visit (INDEPENDENT_AMBULATORY_CARE_PROVIDER_SITE_OTHER): Payer: Medicare Other | Admitting: Neurology

## 2016-09-24 ENCOUNTER — Ambulatory Visit (INDEPENDENT_AMBULATORY_CARE_PROVIDER_SITE_OTHER): Payer: Medicare Other | Admitting: Family Medicine

## 2016-09-24 VITALS — BP 126/91 | HR 80 | Resp 20 | Ht 62.0 in | Wt 183.0 lb

## 2016-09-24 DIAGNOSIS — G4734 Idiopathic sleep related nonobstructive alveolar hypoventilation: Secondary | ICD-10-CM | POA: Diagnosis not present

## 2016-09-24 DIAGNOSIS — N135 Crossing vessel and stricture of ureter without hydronephrosis: Secondary | ICD-10-CM | POA: Diagnosis not present

## 2016-09-24 DIAGNOSIS — G4733 Obstructive sleep apnea (adult) (pediatric): Secondary | ICD-10-CM

## 2016-09-24 DIAGNOSIS — G932 Benign intracranial hypertension: Secondary | ICD-10-CM

## 2016-09-24 DIAGNOSIS — K682 Retroperitoneal fibrosis: Secondary | ICD-10-CM

## 2016-09-24 NOTE — Progress Notes (Signed)
SLEEP MEDICINE CLINIC   Provider:  Melvyn Novasarmen  Landers Prajapati, M D  Referring Provider: Dr Tresa EndoKelly Goldsborough/ Dr Naomie DeanAntonia Ahern, MD   Primary Care Physician:  No PCP Per Patient  Chief Complaint  Patient presents with  . Follow-up    pt did not bring her cpap, unable to download    HPI:  Donna EvenerStephanie Shannon is a 31 y.o. female , seen here as a  revisit  from Dr. Lucia GaskinsAhern, to follow u on recent sleep study.    Chief complaint according to patient : Lots of Headaches, Papilledema, chronic migraines.    06-2016 last note with Dr. Vickey Hugerohmeier, I have pleasure of seeing Donna Shannon today, in referral from my partner Dr. Lucia GaskinsAhern. She has a very extensive past medical history and current medical history of 2 renal parents found so she got her first transplant 20 years ago lost it after a year and a second transplant living donor organ 19 years ago. This kidney has done very well. Her current creatinine is lower than 2. She has however retro- peritoneal fibrosis. As a history of neurogenic bladder, hypertension, depression, hyperlipidemia, and is on home oxygen therapy. She has chronic back pain. She was admitted to the hospital with volume overload on 04/24/2016 after starting Lyrica which caused a 40 pound weight gain mostly in fluid-water. At the time she developed dyspnea on exertion. She remains on CellCept, prednisone and cyclosporine but she has worsening migraines she was given Depakote and Imitrex, but in the hospital she was placed on CPAP and noted a resolution of her migrainous headaches. She was unable to arrange for sleep study in her home town of [place]. She has been using her father CPAP machine. She has a pulsating, throbbing headache a pressure that affects both sides of the head and is sometimes unbearable. Dr. Lucia GaskinsAhern had reviewed her CT of the head, results of an LP performed on 04/29/2016, I reviewed her medication list, the patient supposedly suffered a stroke in 1991. She is on oxygen therapy  every night at 2 L nasal cannula.  Sleep habits are as follows: Goes to bed at 9 PM, she takes Ambien which allows her to go to sleep promptly -after she takes her nocturnal dose of Diamox she takes Diamox twice a day, she takes Lasix in the morning, She still has nocturia 3-4 times each night. She usually sleeps with the TV running in the background, she also uses a ceiling fan. She falls asleep on her side, she sleeps usually on her right, she will hug pillow has one pillow under her head and neck support. 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 Sleep medical history and family sleep history:  Brother committed suicide. 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 She was with her parents. Social history:  Single, lives with parents, her father is a Audiological scientistfirechief.  She smoked for 7 years but quit many years ago, she does not drink alcohol, she drinks caffeine in form of soda, Anheuser-BuschMountain Dew, suite ice tea, no coffee.  Interval history from 09/24/2016 Donna Shannon is here today to follow-up on her recent sleep study which was done by split-night protocol between her last visit with me and the time of the sleep study she was hospitalized in January 2018, had a significant exacerbation of headaches with vision complaints. She underwent an LP which confirmed again  high opening pressure. During the hospitalization she was witnessed to have apneas and significant oxygen desaturation, and her hospital physicians placed her on BiPAP. She believes it was set at 7 cm water pressure. It was also observed that the patient had hypercapnia, over 60 torr.  She also was diagnosed with astigmatism and received new glass by Dr. Dione Booze. The spinal tap improved her headaches. I would like to recapture the results of her sleep study which was performed on 08/15/2016, the patient was diagnosed with an AHI of 48.8 which places her in  the severe apnea category 168 minutes of sleep time were spent with oxygen desaturation the lowest oxygen saturation was 80%. She did not have periodic limb movements, there was no REM sleep and only supine sleep was recorded CPAP was titrated to a final pressure of 7 cm water with alleviation of sleep apnea. The patient should have brought her CPAP, but forgot. Cannot get compliance data -  I will order a nasal pillow P10 to allow her non supine sleep, and a airsense machine.    Review of Systems: Out of a complete 14 system review, the patient complains of only the following symptoms, and all other reviewed systems are negative.   Epworth score 4 from 11 , Fatigue severity score  23 from 38 , depression score 5-15   Social History   Social History  . Marital status: Single    Spouse name: N/A  . Number of children: N/A  . Years of education: N/A   Occupational History  . LPN -school nurse    Social History Main Topics  . Smoking status: Former Smoker    Packs/day: 0.50    Years: 4.00    Types: Cigarettes    Quit date: 07/09/2014  . Smokeless tobacco: Never Used  . Alcohol use Yes     Comment: 04/24/2016 "might have 2 drinks/month, if that"  . Drug use: No  . Sexual activity: No   Other Topics Concern  . Not on file   Social History Narrative   Lives mother and father   Caffeine use: 1 cup soda/day       Family History  Problem Relation Age of Onset  . Hypertension Mother   . CAD Father     s/p CABG  . Hyperlipidemia Father   . Heart disease Father   . Thyroid disease Father     Past Medical History:  Diagnosis Date  . Anemia   . Anxiety   . Arthritis    "left knee" (04/24/2016)  . Back pain   . Chronic lower back pain   . Constipation   . Depression   . Edema   . Essential hypertension   . Factor V Leiden (HCC)    [name] 04/24/2016  . Frequent UTI    [name] 04/24/2016  . GERD (gastroesophageal reflux disease)   . Gout   . High cholesterol   .  HTN (hypertension)   . Kidney disease   . Migraine    "weekly" (04/24/2016)  . Neurogenic bladder    augmented bladder, I/O self caths  . On home oxygen therapy    "2L; every night" (04/24/2016)  . Osteoporosis   . Ovarian cyst dx'd 04/22/2016   left  . Palpitations   . Pneumonia 03/2016   [name] 04/24/2016  . Renal transplant failure and rejection    age 40-11  . 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  Self-catheterizes urinary bladder    "~ 12 X/day" (04/24/2016)  . SOB (shortness of breath)   . Stroke [facility]) 1991   denies residual on 04/24/2016  . Vitamin D deficiency     Past Surgical History:  Procedure Laterality Date  . ABDOMINAL HERNIA REPAIR  "several; when I was little"  . allograph biopsy  2013   [name] 04/24/2016  . HERNIA REPAIR    . KIDNEY TRANSPLANT  1996; 1998   father was donor; mother was donor/notes 04/24/2016  . PACEMAKER INSERTION  "?early 2000s"   for bladder function  . PARTIAL NEPHRECTOMY  1990s X 5   "removing disease"  . PORTACATH PLACEMENT Right 11/2015  . RIGHT OOPHORECTOMY Right 2001   ovarian torsion    Current Outpatient Prescriptions  Medication Sig Dispense Refill  . aspirin 81 MG chewable tablet Chew 81 mg by mouth daily.    . butalbital-acetaminophen-caffeine (FIORICET, ESGIC) 50-325-40 MG tablet Take 1 tablet by mouth 2 (two) times daily as needed for headache.    . Cholecalciferol (VITAMIN D-3) 1000 units CAPS Take 1 capsule (1,000 Units total) by mouth daily. 30 capsule 0  . ciprofloxacin (CIPRO) 500 MG tablet Take 500 mg by mouth 2 (two) times daily.    . clonazePAM (KLONOPIN) 0.5 MG tablet Take 0.5 mg by mouth See admin instructions. Take 2 tablets in the morning, then take one in the afternoon, then take 2 tablets at night per patient    . cycloSPORINE (SANDIMMUNE) 100 MG capsule Take 100 mg by mouth 2 (two) times daily.    . cycloSPORINE (SANDIMMUNE) 25 MG capsule Take 25 mg  by mouth every morning.    . fenofibrate 160 MG tablet Take 160 mg by mouth daily.    . flavoxATE (URISPAS) 100 MG tablet Take 100 mg by mouth 3 (three) times daily. Take every day per patient    . gabapentin (NEURONTIN) 300 MG capsule Take 300 mg by mouth 2 (two) times daily.     . magnesium oxide (MAG-OX) 400 MG tablet Take 800 mg by mouth 2 (two) times daily.    . methylphenidate (RITALIN) 10 MG tablet Take 10 mg by mouth 2 (two) times daily.    . metoprolol (LOPRESSOR) 50 MG tablet Take 1 tablet (50 mg total) by mouth 2 (two) times daily. 60 tablet 1  . mirabegron ER (MYRBETRIQ) 50 MG TB24 tablet Take 50 mg by mouth at bedtime.    . mycophenolate (CELLCEPT) 500 MG tablet Take 500 mg by mouth 2 (two) times daily.    [name] omega-3 acid ethyl esters (LOVAZA) 1 g capsule Take 1 g by mouth 3 (three) times daily.    . ondansetron (ZOFRAN) 8 MG tablet Take 0.5 tablets (4 mg total) by mouth every 8 (eight) hours as needed for nausea or vomiting. 20 tablet 0  . pravastatin (PRAVACHOL) 40 MG tablet Take 40 mg by mouth at bedtime.    . predniSONE (DELTASONE) 2.5 MG tablet Take 2.5 mg by mouth daily with breakfast.    . rizatriptan (MAXALT) 10 MG tablet Take 1 tablet (10 mg total) by mouth daily as needed for migraine. May repeat in 2 hours if needed 10 tablet 6  . sertraline (ZOLOFT) 100 MG tablet Take 100 mg by mouth 2 (two) times daily.    . traMADol (ULTRAM) 50 MG tablet Take 50 mg by mouth 2 (two) times daily.     [name] acetaZOLAMIDE (DIAMOX) 250 MG tablet Take 2 tablets (500 mg total) by  mouth 2 (two) times daily. 120 tablet 2  . furosemide (LASIX) 40 MG tablet Take 1 tablet (40 mg total) by mouth 2 (two) times daily. 30 tablet 1   No current facility-administered medications for this visit.     Allergies as of 09/24/2016 - Review Complete 09/24/2016  Allergen Reaction Noted  . Sulfa antibiotics Itching 06/25/2016    Vitals: BP (!) 126/91   Pulse 80   Resp 20   Ht 5\' 2"  (1.575 m)   Wt 183 lb  (83 kg)   LMP 09/03/2016   BMI 33.47 kg/m  Last Weight:  Wt Readings from Last 1 Encounters:  09/24/16 183 lb (83 kg)   ZOX:WRUE mass index is 33.47 kg/m.     Last Height:   Ht Readings from Last 1 Encounters:  09/24/16 5\' 2"  (1.575 m)    Physical exam:  General: The patient is awake, alert and appears not in acute distress. The patient is well groomed. Head: Normocephalic, atraumatic. Neck is supple. Mallampati 4  neck circumference:19 Nasal airflow patent ,  Retrognathia is not seen.  Cardiovascular:  Regular rate and rhythm , without  murmurs or carotid bruit, and without distended neck veins. Respiratory: Lungs are clear to auscultation. Skin:  Without evidence of edema, or rash Trunk: BMI is 37 *. The patient's posture is stooped   Neurologic exam : The patient is awake and alert, oriented to place and time.   Memory subjective  described as intact.    Attention span & concentration ability appears normal.  Speech is fluent,  without  dysarthria, dysphonia or aphasia.  Mood and affect are appropriate.  Cranial nerves:  Donna Shannon reports no loss of taste or smell sensation. Pupils are equal and briskly reactive to light. Extraocular movements  in vertical and horizontal planes intact and without nystagmus. Visual fields by finger perimetry are intact. Hearing to finger rub intact.  Facial sensation intact to fine touch. Facial motor strength is symmetric and tongue and uvula move midline. Shoulder shrug was symmetrical.   Assessment:  After physical and neurologic examination, review of laboratory studies,  Personal review of imaging studies, reports of other /same  Imaging studies ,  Results of polysomnography/ neurophysiology testing and pre-existing records as far as provided in visit., my assessment is ;  Algie Cales is a 31 y.o. female here as a referral from Dr. Lucia Gaskins with intractable headaches. She has a PMHx of retroperitoneal fibrosis and renal transplants,  obesity, chronic kidney disease, neurogenic bladder, hypertension, depression, hyperlipidemia, migraines, recently started on home oxygen therapy and bipap after hospitalization for 40 pound fluid weight gain.She was admitted 04/24/2016 with concern for volume overload and a 40 pound weight gain in 2 months after starting Lyrica.   -Obesity contributing to her headache disorder and to her hypercapnia (OSA vs Obesity Hypoventilation syndrome ).  -Continue CPAP or keep BIPAP - but she needs a different machine reset .   - Patient with vision loss, intractable headache, increased opening pressure on LP was 29 continue acetazolamide .  Dr Trevor Mace [facility] Dr[facility] Dalbert Garnet has already shown effect. 15 pounds weight loss.  - Ophthalmology: glasses prescribed by Dr. Dione Booze. Better vision.  - Continue Acetazolamide per Dr Lucia Gaskins    The patient was advised of the nature of the diagnosed sleep disorder, the treatment options and risks for general a health and wellness arising from not treating the condition.  I spent more than 25 minutes of  face to face time with the patient.  Greater than 50% of time was spent in counseling and coordination of care. We have discussed the diagnosis and differential and I answered the patient's questions.   Proceed to emergency room if you experience new or worsening symptoms or symptoms do not resolve, if you have new neurologic symptoms or if headache is severe, or for any concerning symptom.  Over half this time was spent on counseling patient on the IIH, sleep apnea, hypercapnia, obesity diagnosis and different therapeutic options available.   1) Donna Shannon has developed severe obstructive sleep apnea, and hypoxemia with hypercapnia.  Donna Shannon has a condition called obesity hypoventilation but I do think that also a metabolic acidosis-alkalosis in context with her other disorders in diseases contributes. The weight gain that she  suddenly suffered from has been attributed to Lyrica. She was able to reduce her fluid overload at the time of the discharge from hospital has been and less headache and experiences no longer shortness of breath. Her daytime sleepiness is also improved. She was furnished a BiPAP machine after her hospitalization but I would like for the patient will sleep study took place a month after her hospitalization to go on the prescribed treatment as per split-night polysomnography result. I will order a CPAP machine between 5 and 12 cm water pressure with 3 cm EPR, and a nasal pillow called air-fit P 10 in small size. I would like to add that additional oxygen was not needed and that during her sleep study CPAP alleviated the hypoxemia component!  2) Donna Shannon has noted abdominal discomfort and a new hardened area under the right rib cage that appears to have the size of about 4 inches diameter it is also right above her first failed transplanted kidney. I wonder if she could have her hernia. I will refer her for an abdominal ultrasound over 2 [place]. The report should be shared with Dr. Kathrene Bongo and her primary care doctor.   Rv with me in 6 month- bring the PAP machine !     Porfirio Mylar Yuridia Couts MD  09/24/2016

## 2016-09-24 NOTE — Addendum Note (Signed)
Addended by: Geronimo RunningINKINS, Gretel Cantu A on: 09/24/2016 01:56 PM   Modules accepted: Orders

## 2016-09-25 ENCOUNTER — Telehealth: Payer: Self-pay | Admitting: Neurology

## 2016-09-25 NOTE — Telephone Encounter (Signed)
Donna Shannon returned my call and claim they did not receive the prescription for cpap from February or yesterday. I advised them I would resend it, and confirmed that we do have the correct fax for them. I have resent the referral.

## 2016-09-25 NOTE — Telephone Encounter (Signed)
I do not understand this message. I called Tennille to discuss but she is with a pt. I asked her to call me back.   I faxed over the cpap order yesterday. She needs to detail exactly what she needs for us.

## 2016-09-25 NOTE — Telephone Encounter (Signed)
Tennille called stating that the recommendation of the new settings of the CPAP machine were to be sent to her but she has not received. I saw the note stating  pt did not bring her cpap and that Dr Vickey Hugerohmeier was unable to download. She has also provided her fax# if any other info could be provided to her from appointment on yesterday fax#[phone]

## 2016-09-27 ENCOUNTER — Ambulatory Visit (INDEPENDENT_AMBULATORY_CARE_PROVIDER_SITE_OTHER): Payer: Medicare Other | Admitting: Family Medicine

## 2016-09-27 ENCOUNTER — Encounter (INDEPENDENT_AMBULATORY_CARE_PROVIDER_SITE_OTHER): Payer: Self-pay | Admitting: Family Medicine

## 2016-09-27 VITALS — BP 137/94 | HR 64 | Temp 98.0°F | Resp 18 | Ht 62.0 in | Wt 176.0 lb

## 2016-09-27 DIAGNOSIS — F418 Other specified anxiety disorders: Secondary | ICD-10-CM

## 2016-09-27 DIAGNOSIS — E669 Obesity, unspecified: Secondary | ICD-10-CM

## 2016-09-27 DIAGNOSIS — Z6832 Body mass index (BMI) 32.0-32.9, adult: Secondary | ICD-10-CM | POA: Diagnosis not present

## 2016-09-27 NOTE — Progress Notes (Signed)
Office: [phone]  /  Fax: [phone]   HPI:   Chief Complaint: OBESITY Donna Shannon is here to discuss her progress with her obesity treatment plan. She is following her eating plan approximately 90 % of the time and states she is exercising 30 minutes 3 times per week. Donna Shannon continues to do well with weight loss. She has struggled with social eating with friends. She notes family sabotage has gotten significantly worse. She is bothered by her family asking her daily if she has lost weight and feels guilty if the scale didn't go down that day. Her weight is 176 lb (79.8 kg) today and has had a weight loss of 4 pounds over a period of 2 weeks since her last visit. She has lost 17 lbs since starting treatment with [place].  Depression with emotional eating behaviors and Anxiety Donna Shannon is struggling with emotional eating and using food for comfort to the extent that it is negatively impacting her health. She often snacks when she is not hungry. Donna Shannon sometimes feels she is out of control and then feels guilty that she made poor food choices. Donna Shannon's mood is depressed, she is struggling more with feeling like a failure with weight loss, even though she is doing very well. She takes her Klonopin 2 times per day and feels this is necessary. She has been working on behavior modification techniques to help reduce her emotional eating and has been somewhat successful. She denies insomnia and She shows no sign of suicidal or homicidal ideations.  Depression screen Donna Shannon 2/9 08/22/2016  Decreased Interest 2  Down, Depressed, Hopeless 1  PHQ - 2 Score 3  Altered sleeping 1  Tired, decreased energy 2  Change in appetite 1  Feeling bad or failure about yourself  0  Trouble concentrating 1  Moving slowly or fidgety/restless 1  Suicidal thoughts 0  PHQ-9 Score 9     Wt Readings from Last 500 Encounters:  09/27/16 176 lb (79.8 kg)  09/24/16 183 lb (83 kg)  09/10/16 180 lb (81.6 kg)    08/22/16 193 lb (87.5 kg)  08/06/16 198 lb 3.2 oz (89.9 kg)  07/15/16 197 lb 12 oz (89.7 kg)  06/25/16 204 lb (92.5 kg)  06/21/16 210 lb 12.8 oz (95.6 kg)  04/30/16 216 lb 14.9 oz (98.4 kg)     ALLERGIES: Allergies  Allergen Reactions  . Sulfa Antibiotics Itching    MEDICATIONS: Current Outpatient Prescriptions on File Prior to Visit  Medication Sig Dispense Refill  . acetaZOLAMIDE (DIAMOX) 250 MG tablet Take 2 tablets (500 mg total) by mouth 2 (two) times daily. 120 tablet 2  . aspirin 81 MG chewable tablet Chew 81 mg by mouth daily.    . butalbital-acetaminophen-caffeine (FIORICET, ESGIC) 50-325-40 MG tablet Take 1 tablet by mouth 2 (two) times daily as needed for headache.    . Cholecalciferol (VITAMIN D-3) 1000 units CAPS Take 1 capsule (1,000 Units total) by mouth daily. 30 capsule 0  . ciprofloxacin (CIPRO) 500 MG tablet Take 500 mg by mouth 2 (two) times daily.    . clonazePAM (KLONOPIN) 0.5 MG tablet Take 0.5 mg by mouth See admin instructions. Take 2 tablets in the morning, then take one in the afternoon, then take 2 tablets at night per patient    . cycloSPORINE (SANDIMMUNE) 100 MG capsule Take 100 mg by mouth 2 (two) times daily.    . cycloSPORINE (SANDIMMUNE) 25 MG capsule Take 25 mg by mouth every morning.    . fenofibrate 160  MG tablet Take 160 mg by mouth daily.    . flavoxATE (URISPAS) 100 MG tablet Take 100 mg by mouth 3 (three) times daily. Take every day per patient    . furosemide (LASIX) 40 MG tablet Take 1 tablet (40 mg total) by mouth 2 (two) times daily. (Patient taking differently: Take 20 mg by mouth daily. ) 30 tablet 1  . gabapentin (NEURONTIN) 300 MG capsule Take 300 mg by mouth daily.     . magnesium oxide (MAG-OX) 400 MG tablet Take 400 mg by mouth daily.     . methylphenidate (RITALIN) 10 MG tablet Take 10 mg by mouth 2 (two) times daily.    . metoprolol (LOPRESSOR) 50 MG tablet Take 1 tablet (50 mg total) by mouth 2 (two) times daily. (Patient taking  differently: Take 50 mg by mouth daily. ) 60 tablet 1  . mirabegron ER (MYRBETRIQ) 50 MG TB24 tablet Take 50 mg by mouth at bedtime.    . mycophenolate (CELLCEPT) 500 MG tablet Take 500 mg by mouth 2 (two) times daily.    [name] omega-3 acid ethyl esters (LOVAZA) 1 g capsule Take 1 g by mouth 3 (three) times daily.    . ondansetron (ZOFRAN) 8 MG tablet Take 0.5 tablets (4 mg total) by mouth every 8 (eight) hours as needed for nausea or vomiting. 20 tablet 0  . pravastatin (PRAVACHOL) 40 MG tablet Take 40 mg by mouth at bedtime.    . predniSONE (DELTASONE) 2.5 MG tablet Take 2.5 mg by mouth daily with breakfast.    . rizatriptan (MAXALT) 10 MG tablet Take 1 tablet (10 mg total) by mouth daily as needed for migraine. May repeat in 2 hours if needed 10 tablet 6  . sertraline (ZOLOFT) 100 MG tablet Take 100 mg by mouth 2 (two) times daily.    . traMADol (ULTRAM) 50 MG tablet Take 50 mg by mouth 2 (two) times daily.      No current facility-administered medications on file prior to visit.     PAST MEDICAL HISTORY: Past Medical History:  Diagnosis Date  . Anemia   . Anxiety   . Arthritis    "left knee" (04/24/2016)  . Back pain   . Chronic lower back pain   . Constipation   . Depression   . Edema   . Essential hypertension   . Factor V Leiden (HCC)    Donna Shannon 04/24/2016  . Frequent UTI    Donna Shannon 04/24/2016  . GERD (gastroesophageal reflux disease)   . Gout   . High cholesterol   . HTN (hypertension)   . Kidney disease   . Migraine    "weekly" (04/24/2016)  . Neurogenic bladder    augmented bladder, I/O self caths  . On home oxygen therapy    "2L; every night" (04/24/2016)  . Osteoporosis   . Ovarian cyst dx'd 04/22/2016   left  . Palpitations   . Pneumonia 03/2016   Donna Shannon 04/24/2016  . Renal transplant failure and rejection    age 13-11  . 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 Self-catheterizes urinary bladder    "~ 12  X/day" (04/24/2016)  . SOB (shortness of breath)   . Stroke [facility]) 1991   denies residual on 04/24/2016  . Vitamin D deficiency     PAST SURGICAL HISTORY: Past Surgical History:  Procedure Laterality Date  . ABDOMINAL HERNIA REPAIR  "several; when I was little"  .  allograph biopsy  2013   Donna Shannon 04/24/2016  . HERNIA REPAIR    . KIDNEY TRANSPLANT  1996; 1998   father was donor; mother was donor/notes 04/24/2016  . PACEMAKER INSERTION  "?early 2000s"   for bladder function  . PARTIAL NEPHRECTOMY  1990s X 5   "removing disease"  . PORTACATH PLACEMENT Right 11/2015  . RIGHT OOPHORECTOMY Right 2001   ovarian torsion    SOCIAL HISTORY: Social History  Substance Use Topics  . Smoking status: Former Smoker    Packs/day: 0.50    Years: 4.00    Types: Cigarettes    Quit date: 07/09/2014  . Smokeless tobacco: Never Used  . Alcohol use Yes     Comment: 04/24/2016 "might have 2 drinks/month, if that"    FAMILY HISTORY: Family History  Problem Relation Age of Onset  . Hypertension Mother   . CAD Father     s/p CABG  . Hyperlipidemia Father   . Heart disease Father   . Thyroid disease Father     ROS: Review of Systems  Constitutional: Positive for weight loss.  Psychiatric/Behavioral: Positive for depression. Negative for suicidal ideas. The patient does not have insomnia.     PHYSICAL EXAM: Blood pressure (!) 137/94, pulse 64, temperature 98 F (36.7 C), temperature source Oral, resp. rate 18, height 5\' 2"  (1.575 m), weight 176 lb (79.8 kg), last menstrual period 09/27/2016, SpO2 96 %. Body mass index is 32.19 kg/m. Physical Exam  Constitutional: She is oriented to person, place, and time. She appears well-developed and well-nourished.  Cardiovascular: Normal rate.   Pulmonary/Chest: Effort normal.  Neurological: She is oriented to person, place, and time.  Skin: Skin is warm and dry.  Vitals reviewed.   RECENT LABS AND TESTS: BMET    Component Value Date/Time     NA 142 08/22/2016 1440   K 3.9 08/22/2016 1440   CL 102 08/22/2016 1440   CO2 23 08/22/2016 1440   GLUCOSE 102 (H) 08/22/2016 1440   GLUCOSE 107 (H) 07/15/2016 0302   BUN 39 (H) 08/22/2016 1440   CREATININE 2.37 (H) 08/22/2016 1440   CALCIUM 9.5 08/22/2016 1440   GFRNONAA 26 (L) 08/22/2016 1440   GFRAA 30 (L) 08/22/2016 1440   Lab Results  Component Value Date   HGBA1C 5.1 08/22/2016   Lab Results  Component Value Date   INSULIN 42.5 (H) 08/22/2016   CBC    Component Value Date/Time   WBC 5.8 08/22/2016 1440   WBC 5.2 07/15/2016 0302   RBC 3.60 (L) 08/22/2016 1440   RBC 3.92 07/15/2016 0302   HGB 11.7 (L) 07/15/2016 0302   HCT 33.0 (L) 08/22/2016 1440   PLT 203 07/15/2016 0302   PLT 228 06/21/2016 1623   MCV 92 08/22/2016 1440   MCH 29.7 08/22/2016 1440   MCH 29.8 07/15/2016 0302   MCHC 32.4 08/22/2016 1440   MCHC 32.6 07/15/2016 0302   RDW 14.5 08/22/2016 1440   LYMPHSABS 1.9 08/22/2016 1440   MONOABS 0.4 07/11/2016 1846   EOSABS 0.1 08/22/2016 1440   BASOSABS 0.0 08/22/2016 1440   Iron/TIBC/Ferritin/ %Sat No results found for: IRON, TIBC, FERRITIN, IRONPCTSAT Lipid Panel  No results found for: CHOL, TRIG, HDL, CHOLHDL, VLDL, LDLCALC, LDLDIRECT Hepatic Function Panel     Component Value Date/Time   PROT 7.2 08/22/2016 1440   ALBUMIN 4.5 08/22/2016 1440   AST 37 08/22/2016 1440   ALT 24 08/22/2016 1440   ALKPHOS 41 08/22/2016 1440   BILITOT 0.5  08/22/2016 1440      Component Value Date/Time   TSH 0.019 (L) 08/22/2016 1440   TSH 3.890 04/25/2016 0157    ASSESSMENT AND PLAN: Depression with anxiety  Class 1 obesity without serious comorbidity with body mass index (BMI) of 32.0 to 32.9 in adult, unspecified obesity type  PLAN:  Depression with Emotional Eating Behaviors and Anxiety We discussed behavior modification techniques today to help Judeth CornfieldStephanie deal with her emotional eating and depression. She has agreed to continue to take her medications  as directed and we discussed cognitive behavioral therapy to help her work on strategies to avoid emotional eating as well as how to deal with her family sabotage and she agreed to follow up as directed.  We spent > than 50% of the 15 minute visit on the counseling as documented in the note.  Obesity Judeth CornfieldStephanie is currently in the action stage of change. As such, her goal is to continue with weight loss efforts She has agreed to keep a food journal with 300 to 500 calories and 20 to 30 grams of protein at lunch daily and follow the Category 2 plan Judeth CornfieldStephanie has been instructed to work up to a goal of 150 minutes of combined cardio and strengthening exercise per week for weight loss and overall health benefits. We discussed the following Behavioral Modification Stratagies today: increasing lean protein intake, decreasing simple carbohydrates , increasing lower sugar fruits and dealing with family sabotage.  Judeth CornfieldStephanie is restricted from weighing herself at home, in-between appointments and she is to inform her family of this so they stop asking about her weight daily.  Judeth CornfieldStephanie has agreed to follow up with our clinic in 2 weeks. She was informed of the importance of frequent follow up visits to maximize her success with intensive lifestyle modifications for her multiple health conditions.  I, [place] CraneJoanne Murray, am acting as scribe for Quillian Quincearen Beasley, MD  I have reviewed the above documentation for accuracy and completeness, and I agree with the above. -Quillian Quincearen Beasley, MD

## 2016-10-02 ENCOUNTER — Ambulatory Visit
Admission: RE | Admit: 2016-10-02 | Discharge: 2016-10-02 | Disposition: A | Discharge status: Home / Self Care | Payer: Medicare Other | Source: Ambulatory Visit | Attending: Neurology | Admitting: Neurology

## 2016-10-02 ENCOUNTER — Other Ambulatory Visit: Payer: Self-pay | Admitting: Neurology

## 2016-10-02 DIAGNOSIS — G4733 Obstructive sleep apnea (adult) (pediatric): Secondary | ICD-10-CM

## 2016-10-02 DIAGNOSIS — K682 Retroperitoneal fibrosis: Secondary | ICD-10-CM

## 2016-10-02 DIAGNOSIS — N135 Crossing vessel and stricture of ureter without hydronephrosis: Secondary | ICD-10-CM

## 2016-10-02 DIAGNOSIS — G932 Benign intracranial hypertension: Secondary | ICD-10-CM

## 2016-10-02 DIAGNOSIS — G4734 Idiopathic sleep related nonobstructive alveolar hypoventilation: Secondary | ICD-10-CM

## 2016-10-03 ENCOUNTER — Telehealth: Payer: Self-pay | Admitting: Neurology

## 2016-10-03 NOTE — Telephone Encounter (Signed)
-----   Message from Melvyn Novasarmen Dohmeier, MD sent at 10/02/2016  3:26 PM EDT ----- Normal abdominal [place]

## 2016-10-03 NOTE — Telephone Encounter (Signed)
Pt called request imaging results. Pt said she is anxious

## 2016-10-03 NOTE — Telephone Encounter (Signed)
I called Donna Shannon. I advised her that her US abdominal was normal. Donna Shannon verbalized understanding of results. Donna Shannon had no questions at this time but was encouraged to call back if questions arise.

## 2016-10-04 ENCOUNTER — Telehealth: Payer: Self-pay

## 2016-10-04 NOTE — Telephone Encounter (Signed)
Received faxed OV notes. Dr. Callie FieldingGod[place] deferred to Dr. Lucia GaskinsAhern for management of pseudotumor cerebri. States, "She did improve drastically w/ LP in the hospital and symptoms seem to be still OK at this time. 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 Not sure if OSA is r/t this phenomenon. But clearly as her OSA has been treated she has improved. No need for anything additional at this time. Plan: She seems to need a lot of meds for comfort. She was not willing to decrease the Klonopin. She was willing to decrease the Neurontin and Ultram. We will continue to try to decrease these meds slowly." Given to Dr. Lucia GaskinsAhern for review.

## 2016-10-11 ENCOUNTER — Ambulatory Visit (INDEPENDENT_AMBULATORY_CARE_PROVIDER_SITE_OTHER): Payer: Medicare Other | Admitting: Family Medicine

## 2016-10-11 VITALS — BP 125/80 | HR 90 | Temp 98.5°F | Ht 62.0 in | Wt 173.0 lb

## 2016-10-11 DIAGNOSIS — G4739 Other sleep apnea: Secondary | ICD-10-CM

## 2016-10-11 DIAGNOSIS — E669 Obesity, unspecified: Secondary | ICD-10-CM | POA: Diagnosis not present

## 2016-10-11 DIAGNOSIS — Z6831 Body mass index (BMI) 31.0-31.9, adult: Secondary | ICD-10-CM | POA: Diagnosis not present

## 2016-10-11 DIAGNOSIS — E559 Vitamin D deficiency, unspecified: Secondary | ICD-10-CM

## 2016-10-11 MED ORDER — VITAMIN D-3 25 MCG (1000 UT) PO CAPS: 1.0000 | ORAL_CAPSULE | Freq: Every day | ORAL | 0 refills | Status: DC

## 2016-10-11 NOTE — Progress Notes (Signed)
Office: [phone]  /  Fax: [phone]   HPI:   Chief Complaint: OBESITY Donna Shannon is here to discuss her progress with her obesity treatment plan. She is following her eating plan approximately 100 % of the time and states she is walking 1 mile  3 times per week. Donna Shannon continues to do well with weight loss. She still struggles with eating out but is starting to get more family support. Her weight is 173 lb (78.5 kg) today and has had a weight loss of 3 pounds over a period of 2 weeks since her last visit. She has lost 20 lbs since starting treatment with [place].  Vitamin D deficiency Donna Shannon has a diagnosis of vitamin D deficiency. She is currently stable on vit D, not yet at goal and denies nausea, vomiting or muscle weakness.  Sleep Apnea Donna Shannon changed CPAP masks and is doing better on a nasal mask and notes sleeping well. Her fatigue has improved.  Wt Readings from Last 500 Encounters:  10/11/16 173 lb (78.5 kg)  09/27/16 176 lb (79.8 kg)  09/24/16 183 lb (83 kg)  09/10/16 180 lb (81.6 kg)  08/22/16 193 lb (87.5 kg)  08/06/16 198 lb 3.2 oz (89.9 kg)  07/15/16 197 lb 12 oz (89.7 kg)  06/25/16 204 lb (92.5 kg)  06/21/16 210 lb 12.8 oz (95.6 kg)  04/30/16 216 lb 14.9 oz (98.4 kg)     ALLERGIES: Allergies  Allergen Reactions   Sulfa Antibiotics Itching    MEDICATIONS: Current Outpatient Prescriptions on File Prior to Visit  Medication Sig Dispense Refill   aspirin 81 MG chewable tablet Chew 81 mg by mouth daily.     butalbital-acetaminophen-caffeine (FIORICET, ESGIC) 50-325-40 MG tablet Take 1 tablet by mouth 2 (two) times daily as needed for headache.     Cholecalciferol (VITAMIN D-3) 1000 units CAPS Take 1 capsule (1,000 Units total) by mouth daily. 30 capsule 0   ciprofloxacin (CIPRO) 500 MG tablet Take 500 mg by mouth 2 (two) times daily.     clonazePAM (KLONOPIN) 0.5 MG tablet Take 0.5 mg by mouth See admin instructions. Take 2 tablets in the  morning, then take one in the afternoon, then take 2 tablets at night per patient     cycloSPORINE (SANDIMMUNE) 100 MG capsule Take 100 mg by mouth 2 (two) times daily.     cycloSPORINE (SANDIMMUNE) 25 MG capsule Take 25 mg by mouth every morning.     fenofibrate 160 MG tablet Take 160 mg by mouth daily.     flavoxATE (URISPAS) 100 MG tablet Take 100 mg by mouth 3 (three) times daily. Take every day per patient     gabapentin (NEURONTIN) 300 MG capsule Take 300 mg by mouth daily.      magnesium oxide (MAG-OX) 400 MG tablet Take 400 mg by mouth daily.      methylphenidate (RITALIN) 10 MG tablet Take 10 mg by mouth 2 (two) times daily.     metoprolol (LOPRESSOR) 50 MG tablet Take 1 tablet (50 mg total) by mouth 2 (two) times daily. (Patient taking differently: Take 50 mg by mouth daily. ) 60 tablet 1   mirabegron ER (MYRBETRIQ) 50 MG TB24 tablet Take 50 mg by mouth at bedtime.     mycophenolate (CELLCEPT) 500 MG tablet Take 500 mg by mouth 2 (two) times daily.     omega-3 acid ethyl esters (LOVAZA) 1 g capsule Take 1 g by mouth 3 (three) times daily.     ondansetron (ZOFRAN) 8  MG tablet Take 0.5 tablets (4 mg total) by mouth every 8 (eight) hours as needed for nausea or vomiting. 20 tablet 0   pravastatin (PRAVACHOL) 40 MG tablet Take 40 mg by mouth at bedtime.     predniSONE (DELTASONE) 2.5 MG tablet Take 2.5 mg by mouth daily with breakfast.     rizatriptan (MAXALT) 10 MG tablet Take 1 tablet (10 mg total) by mouth daily as needed for migraine. May repeat in 2 hours if needed 10 tablet 6   sertraline (ZOLOFT) 100 MG tablet Take 100 mg by mouth 2 (two) times daily.     traMADol (ULTRAM) 50 MG tablet Take 50 mg by mouth 2 (two) times daily.      acetaZOLAMIDE (DIAMOX) 250 MG tablet Take 2 tablets (500 mg total) by mouth 2 (two) times daily. 120 tablet 2   furosemide (LASIX) 40 MG tablet Take 1 tablet (40 mg total) by mouth 2 (two) times daily. (Patient taking differently: Take 20  mg by mouth daily. ) 30 tablet 1   No current facility-administered medications on file prior to visit.     PAST MEDICAL HISTORY: Past Medical History:  Diagnosis Date   Anemia    Anxiety    Arthritis    "left knee" (04/24/2016)   Back pain    Chronic lower back pain    Constipation    Depression    Edema    Essential hypertension    Factor V Leiden (HCC)    [name] 04/24/2016   Frequent UTI    /notes 04/24/2016   GERD (gastroesophageal reflux disease)    Gout    High cholesterol    HTN (hypertension)    Kidney disease    Migraine    "weekly" (04/24/2016)   Neurogenic bladder    augmented bladder, I/O self caths   On home oxygen therapy    "2L; every night" (04/24/2016)   Osteoporosis    Ovarian cyst dx'd 04/22/2016   left   Palpitations    Pneumonia 03/2016   [name] 04/24/2016   Renal transplant failure and rejection    age 94-11   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   Self-catheterizes urinary bladder    "~ 12 X/day" (04/24/2016)   SOB (shortness of breath)    Stroke (HCC) 1991   denies residual on 04/24/2016   Vitamin D deficiency     PAST SURGICAL HISTORY: Past Surgical History:  Procedure Laterality Date   ABDOMINAL HERNIA REPAIR  "several; when I was little"   allograph biopsy  2013   /notes 04/24/2016   HERNIA REPAIR     KIDNEY TRANSPLANT  1996; 1998   father was donor; mother was donor/notes 04/24/2016   PACEMAKER INSERTION  "?early 2000s"   for bladder function   PARTIAL NEPHRECTOMY  1990s X 5   "removing disease"   PORTACATH PLACEMENT Right 11/2015   RIGHT OOPHORECTOMY Right 2001   ovarian torsion    SOCIAL HISTORY: Social History  Substance Use Topics   Smoking status: Former Smoker    Packs/day: 0.50    Years: 4.00    Types: Cigarettes    Quit date: 07/09/2014   Smokeless tobacco: Never Used   Alcohol use Yes     Comment: 04/24/2016  "might have 2 drinks/month, if that"    FAMILY HISTORY: Family History  Problem Relation Age of Onset   Hypertension Mother    CAD Father  s/p CABG   Hyperlipidemia Father    Heart disease Father    Thyroid disease Father     ROS: Review of Systems  Constitutional: Positive for malaise/fatigue and weight loss.  Gastrointestinal: Negative for nausea and vomiting.  Musculoskeletal:       Negative muscle weakness    PHYSICAL EXAM: Blood pressure 125/80, pulse 90, temperature 98.5 F (36.9 C), temperature source Oral, height  (1.575 m), weight 173 lb (78.5 kg), last menstrual period 09/27/2016, SpO2 97 %. Body mass index is 31.64 kg/m. Physical Exam  Constitutional: She is oriented to person, place, and time. She appears well-developed and well-nourished.  Cardiovascular: Normal rate.   Pulmonary/Chest: Effort normal.  Musculoskeletal: Normal range of motion.  Neurological: She is oriented to person, place, and time.  Skin: Skin is warm and dry.  Psychiatric: She has a normal mood and affect. Her behavior is normal.  Vitals reviewed.   RECENT LABS AND TESTS: BMET    Component Value Date/Time   NA 142 08/22/2016 1440   K 3.9 08/22/2016 1440   CL 102 08/22/2016 1440   CO2 23 08/22/2016 1440   GLUCOSE 102 (H) 08/22/2016 1440   GLUCOSE 107 (H) 07/15/2016 0302   BUN 39 (H) 08/22/2016 1440   CREATININE 2.37 (H) 08/22/2016 1440   CALCIUM 9.5 08/22/2016 1440   GFRNONAA 26 (L) 08/22/2016 1440   GFRAA 30 (L) 08/22/2016 1440   Lab Results  Component Value Date   HGBA1C 5.1 08/22/2016   Lab Results  Component Value Date   INSULIN 42.5 (H) 08/22/2016   CBC    Component Value Date/Time   WBC 5.8 08/22/2016 1440   WBC 5.2 07/15/2016 0302   RBC 3.60 (L) 08/22/2016 1440   RBC 3.92 07/15/2016 0302   HGB 11.7 (L) 07/15/2016 0302   HCT 33.0 (L) 08/22/2016 1440   PLT 203 07/15/2016 0302   PLT 228 06/21/2016 1623   MCV 92 08/22/2016 1440   MCH 29.7  08/22/2016 1440   MCH 29.8 07/15/2016 0302   MCHC 32.4 08/22/2016 1440   MCHC 32.6 07/15/2016 0302   RDW 14.5 08/22/2016 1440   LYMPHSABS 1.9 08/22/2016 1440   MONOABS 0.4 07/11/2016 1846   EOSABS 0.1 08/22/2016 1440   BASOSABS 0.0 08/22/2016 1440   Iron/TIBC/Ferritin/ %Sat No results found for: IRON, TIBC, FERRITIN, IRONPCTSAT Lipid Panel  No results found for: CHOL, TRIG, HDL, CHOLHDL, VLDL, LDLCALC, LDLDIRECT Hepatic Function Panel     Component Value Date/Time   PROT 7.2 08/22/2016 1440   ALBUMIN 4.5 08/22/2016 1440   AST 37 08/22/2016 1440   ALT 24 08/22/2016 1440   ALKPHOS 41 08/22/2016 1440   BILITOT 0.5 08/22/2016 1440      Component Value Date/Time   TSH 0.019 (L) 08/22/2016 1440   TSH 3.890 04/25/2016 0157    ASSESSMENT AND PLAN: Other sleep apnea  Vitamin D deficiency - Plan: Cholecalciferol (VITAMIN D-3) 1000 units CAPS  Class 1 obesity without serious comorbidity with body mass index (BMI) of 31.0 to 31.9 in adult, unspecified obesity type  PLAN:  Vitamin D Deficiency Donna Shannon was informed that low vitamin D levels contributes to fatigue and are associated with obesity, breast, and colon cancer. She agrees to continue to take prescription Vit D ,000 IU every week, we will refill for 1 month and will follow up for routine testing of vitamin D, at least 2-3 times per year. She was informed of the risk of over-replacement of vitamin D and agrees  to not increase her dose unless he discusses this with [place] first. We will re-check labs in 6 weeks and Donna Shannon agreed to follow up with our clinic in 2 weeks.  Sleep Apnea Donna Shannon agrees to continue with CPAP, we discussed ways to help her tolerate it longer at night.  Obesity Donna Shannon is currently in the action stage of change. As such, her goal is to continue with weight loss efforts She has agreed to follow the Category 2 plan Donna Shannon has been instructed to work up to a goal of 150 minutes of combined  cardio and strengthening exercise per week for weight loss and overall health benefits. We discussed the following Behavioral Modification Stratagies today: decreasing simple carbohydrates , increasing vegetables, decreasing sodium intake, decrease eating out and dealing with family or coworker sabotage  Donna Shannon has agreed to follow up with our clinic in 2 weeks. She was informed of the importance of frequent follow up visits to maximize her success with intensive lifestyle modifications for her multiple health conditions.  I, [place] Crane, am acting as scribe for Quillian Quince, MD  I have reviewed the above documentation for accuracy and completeness, and I agree with the above. -Quillian Quince, MD

## 2016-10-18 ENCOUNTER — Telehealth: Payer: Self-pay

## 2016-10-18 NOTE — Telephone Encounter (Signed)
That's fine, she has been on it without any problems. Please refill. Thanks!

## 2016-10-18 NOTE — Telephone Encounter (Signed)
Received faxed request for Diamox 90 day refill. Pt has listed allergy to sulfa and last refill was sent in by Dr. Susie Cassette. Pt has f/u scheduled w/ Dr. Lucia Gaskins on 11/06/16.

## 2016-10-22 MED ORDER — ACETAZOLAMIDE 250 MG PO TABS: 500.0000 mg | ORAL_TABLET | Freq: Two times a day (BID) | ORAL | 3 refills | Status: DC

## 2016-10-22 NOTE — Addendum Note (Signed)
Addended by: Donnelly Angelica on: 10/22/2016 09:38 AM   Modules accepted: Orders

## 2016-10-22 NOTE — Telephone Encounter (Signed)
Diamox 90 day refills e-scribed per faxed request from pharmacy.

## 2016-10-24 ENCOUNTER — Ambulatory Visit (INDEPENDENT_AMBULATORY_CARE_PROVIDER_SITE_OTHER): Payer: Medicare Other | Admitting: Family Medicine

## 2016-10-24 VITALS — BP 123/83 | HR 83 | Temp 98.4°F | Ht 62.0 in | Wt 172.0 lb

## 2016-10-24 DIAGNOSIS — F418 Other specified anxiety disorders: Secondary | ICD-10-CM | POA: Diagnosis not present

## 2016-10-24 DIAGNOSIS — E669 Obesity, unspecified: Secondary | ICD-10-CM | POA: Diagnosis not present

## 2016-10-24 DIAGNOSIS — Z6831 Body mass index (BMI) 31.0-31.9, adult: Secondary | ICD-10-CM

## 2016-10-24 MED ORDER — ESCITALOPRAM OXALATE 10 MG PO TABS: 10.0000 mg | ORAL_TABLET | Freq: Every day | ORAL | 0 refills | Status: DC

## 2016-10-24 NOTE — Progress Notes (Signed)
Office: [phone]  /  Fax: [phone]   HPI:   Chief Complaint: OBESITY Donna Shannon is here to discuss her progress with her obesity treatment plan. She is following her eating plan approximately 70 % of the time and states she is exercising 20 minutes 2 times per week. Donna Shannon is on category 2 plan with lunch journaling but is getting bored with her dinner and would like more options. She is discouraged she hasn't lost more weight than she has. Her weight is 172 lb (78 kg) today and has had a weight loss of 1 pound over a period of 2 weeks since her last visit. She has lost 21 lbs since starting treatment with [place].  Depression with anxiety Donna Shannon notes increased anxiety, is on Zoloft and Clonidine but often has panic attacks and worries about dying on a daily basis. She shows no signs of suicidal or homicidal ideations.  Wt Readings from Last 500 Encounters:  10/24/16 172 lb (78 kg)  10/11/16 173 lb (78.5 kg)  09/27/16 176 lb (79.8 kg)  09/24/16 183 lb (83 kg)  09/10/16 180 lb (81.6 kg)  08/22/16 193 lb (87.5 kg)  08/06/16 198 lb 3.2 oz (89.9 kg)  07/15/16 197 lb 12 oz (89.7 kg)  06/25/16 204 lb (92.5 kg)  06/21/16 210 lb 12.8 oz (95.6 kg)  04/30/16 216 lb 14.9 oz (98.4 kg)     ALLERGIES: Allergies  Allergen Reactions  . Sulfa Antibiotics Itching    MEDICATIONS: Current Outpatient Prescriptions on File Prior to Visit  Medication Sig Dispense Refill  . acetaZOLAMIDE (DIAMOX) 250 MG tablet Take 2 tablets (500 mg total) by mouth 2 (two) times daily. 360 tablet 3  . aspirin 81 MG chewable tablet Chew 81 mg by mouth daily.    . butalbital-acetaminophen-caffeine (FIORICET, ESGIC) 50-325-40 MG tablet Take 1 tablet by mouth 2 (two) times daily as needed for headache.    . Cholecalciferol (VITAMIN D-3) 1000 units CAPS Take 1 capsule (1,000 Units total) by mouth daily. 30 capsule 0  . ciprofloxacin (CIPRO) 500 MG tablet Take 500 mg by mouth 2 (two) times daily.    .  clonazePAM (KLONOPIN) 0.5 MG tablet Take 0.5 mg by mouth See admin instructions. Take 2 tablets in the morning, then take one in the afternoon, then take 2 tablets at night per patient    . cycloSPORINE (SANDIMMUNE) 100 MG capsule Take 100 mg by mouth 2 (two) times daily.    . cycloSPORINE (SANDIMMUNE) 25 MG capsule Take 25 mg by mouth every morning.    . fenofibrate 160 MG tablet Take 160 mg by mouth daily.    . flavoxATE (URISPAS) 100 MG tablet Take 100 mg by mouth 3 (three) times daily. Take every day per patient    . furosemide (LASIX) 40 MG tablet Take 1 tablet (40 mg total) by mouth 2 (two) times daily. (Patient taking differently: Take 20 mg by mouth daily. ) 30 tablet 1  . gabapentin (NEURONTIN) 300 MG capsule Take 300 mg by mouth daily.     . magnesium oxide (MAG-OX) 400 MG tablet Take 400 mg by mouth daily.     . methylphenidate (RITALIN) 10 MG tablet Take 10 mg by mouth 2 (two) times daily.    . metoprolol (LOPRESSOR) 50 MG tablet Take 1 tablet (50 mg total) by mouth 2 (two) times daily. (Patient taking differently: Take 50 mg by mouth daily. ) 60 tablet 1  . mirabegron ER (MYRBETRIQ) 50 MG TB24 tablet Take 50  mg by mouth at bedtime.    . mycophenolate (CELLCEPT) 500 MG tablet Take 500 mg by mouth 2 (two) times daily.    [name] omega-3 acid ethyl esters (LOVAZA) 1 g capsule Take 1 g by mouth 3 (three) times daily.    . ondansetron (ZOFRAN) 8 MG tablet Take 0.5 tablets (4 mg total) by mouth every 8 (eight) hours as needed for nausea or vomiting. 20 tablet 0  . pravastatin (PRAVACHOL) 40 MG tablet Take 40 mg by mouth at bedtime.    . predniSONE (DELTASONE) 2.5 MG tablet Take 2.5 mg by mouth daily with breakfast.    . rizatriptan (MAXALT) 10 MG tablet Take 1 tablet (10 mg total) by mouth daily as needed for migraine. May repeat in 2 hours if needed 10 tablet 6  . sertraline (ZOLOFT) 100 MG tablet Take 100 mg by mouth 2 (two) times daily.    . traMADol (ULTRAM) 50 MG tablet Take 50 mg by mouth 2  (two) times daily.      No current facility-administered medications on file prior to visit.     PAST MEDICAL HISTORY: Past Medical History:  Diagnosis Date  . Anemia   . Anxiety   . Arthritis    "left knee" (04/24/2016)  . Back pain   . Chronic lower back pain   . Constipation   . Depression   . Edema   . Essential hypertension   . Factor V Leiden (HCC)    Donna Shannon 04/24/2016  . Frequent UTI    Donna Shannon 04/24/2016  . GERD (gastroesophageal reflux disease)   . Gout   . High cholesterol   . HTN (hypertension)   . Kidney disease   . Migraine    "weekly" (04/24/2016)  . Neurogenic bladder    augmented bladder, I/O self caths  . On home oxygen therapy    "2L; every night" (04/24/2016)  . Osteoporosis   . Ovarian cyst dx'd 04/22/2016   left  . Palpitations   . Pneumonia 03/2016   Donna Shannon 04/24/2016  . Renal transplant failure and rejection    age 73-11  . 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 Self-catheterizes urinary bladder    "~ 12 X/day" (04/24/2016)  . SOB (shortness of breath)   . Stroke Donna Shannon) 1991   denies residual on 04/24/2016  . Vitamin D deficiency     PAST SURGICAL HISTORY: Past Surgical History:  Procedure Laterality Date  . ABDOMINAL HERNIA REPAIR  "several; when I was little"  . allograph biopsy  2013   Donna Shannon 04/24/2016  . HERNIA REPAIR    . KIDNEY TRANSPLANT  1996; 1998   father was donor; mother was donor/notes 04/24/2016  . PACEMAKER INSERTION  "?early 2000s"   for bladder function  . PARTIAL NEPHRECTOMY  1990s X 5   "removing disease"  . PORTACATH PLACEMENT Right 11/2015  . RIGHT OOPHORECTOMY Right 2001   ovarian torsion    SOCIAL HISTORY: Social History  Substance Use Topics  . Smoking status: Former Smoker    Packs/day: 0.50    Years: 4.00    Types: Cigarettes    Quit date: 07/09/2014  . Smokeless tobacco: Never Used  . Alcohol use Yes     Comment: 04/24/2016 "might have 2  drinks/month, if that"    FAMILY HISTORY: Family History  Problem Relation Age of Onset  . Hypertension Mother   . CAD Father     s/p  CABG  . Hyperlipidemia Father   . Heart disease Father   . Thyroid disease Father     ROS: Review of Systems  Constitutional: Positive for weight loss.  Psychiatric/Behavioral: Positive for depression. Negative for suicidal ideas.       Anxiety    PHYSICAL EXAM: Blood pressure 123/83, pulse 83, temperature 98.4 F (36.9 C), temperature source Oral, height  (1.575 m), weight 172 lb (78 kg), last menstrual period 09/27/2016, SpO2 97 %. Body mass index is 31.46 kg/m. Physical Exam  Constitutional: She is oriented to person, place, and time. She appears well-developed and well-nourished.  Cardiovascular: Normal rate.   Pulmonary/Chest: Effort normal.  Musculoskeletal: Normal range of motion.  Neurological: She is oriented to person, place, and time.  Skin: Skin is warm and dry.  Vitals reviewed.   RECENT LABS AND TESTS: BMET    Component Value Date/Time   NA 142 08/22/2016 1440   K 3.9 08/22/2016 1440   CL 102 08/22/2016 1440   CO2 23 08/22/2016 1440   GLUCOSE 102 (H) 08/22/2016 1440   GLUCOSE 107 (H) 07/15/2016 0302   BUN 39 (H) 08/22/2016 1440   CREATININE 2.37 (H) 08/22/2016 1440   CALCIUM 9.5 08/22/2016 1440   GFRNONAA 26 (L) 08/22/2016 1440   GFRAA 30 (L) 08/22/2016 1440   Lab Results  Component Value Date   HGBA1C 5.1 08/22/2016   Lab Results  Component Value Date   INSULIN 42.5 (H) 08/22/2016   CBC    Component Value Date/Time   WBC 5.8 08/22/2016 1440   WBC 5.2 07/15/2016 0302   RBC 3.60 (L) 08/22/2016 1440   RBC 3.92 07/15/2016 0302   HGB 11.7 (L) 07/15/2016 0302   HCT 33.0 (L) 08/22/2016 1440   PLT 203 07/15/2016 0302   PLT 228 06/21/2016 1623   MCV 92 08/22/2016 1440   MCH 29.7 08/22/2016 1440   MCH 29.8 07/15/2016 0302   MCHC 32.4 08/22/2016 1440   MCHC 32.6 07/15/2016 0302   RDW 14.5 08/22/2016  1440   LYMPHSABS 1.9 08/22/2016 1440   MONOABS 0.4 07/11/2016 1846   EOSABS 0.1 08/22/2016 1440   BASOSABS 0.0 08/22/2016 1440   Iron/TIBC/Ferritin/ %Sat No results found for: IRON, TIBC, FERRITIN, IRONPCTSAT Lipid Panel  No results found for: CHOL, TRIG, HDL, CHOLHDL, VLDL, LDLCALC, LDLDIRECT Hepatic Function Panel     Component Value Date/Time   PROT 7.2 08/22/2016 1440   ALBUMIN 4.5 08/22/2016 1440   AST 37 08/22/2016 1440   ALT 24 08/22/2016 1440   ALKPHOS 41 08/22/2016 1440   BILITOT 0.5 08/22/2016 1440      Component Value Date/Time   TSH 0.019 (L) 08/22/2016 1440   TSH 3.890 04/25/2016 0157    ASSESSMENT AND PLAN: Depression with anxiety - Plan: escitalopram (LEXAPRO) 10 MG tablet  Class 1 obesity without serious comorbidity with body mass index (BMI) of 31.0 to 31.9 in adult, unspecified obesity type  PLAN:  Depression with anxiety Donna Shannon agrees to continue Zoloft and to start to take Lexapro 10 mg every night #30 with no refills and will follow up with our clinic in 2 weeks.  We spent > than 50% of the 30 minute visit on the counseling as documented in the note.   Obesity Donna Shannon is currently in the action stage of change. As such, her goal is to continue with weight loss efforts She has agreed to keep a food journal with 400 to 550 calories and 35+ grams of  protein at  supper daily and follow the Category 2 plan Donna Shannon has been instructed to work up to a goal of 150 minutes of combined cardio and strengthening exercise per week for weight loss and overall health benefits. We discussed the following Behavioral Modification Stratagies today: increasing lean protein intake, decreasing simple carbohydrates  and work on meal planning and easy cooking plans  Donna Shannon has agreed to follow up with our clinic in 2 weeks. She was informed of the importance of frequent follow up visits to maximize her success with intensive lifestyle modifications for her  multiple health conditions.  I, Nevada Crane, am acting as scribe for Quillian Quince, MD  I have reviewed the above documentation for accuracy and completeness, and I agree with the above. -Quillian Quince, MD

## 2016-11-05 ENCOUNTER — Ambulatory Visit: Payer: Medicare Other | Admitting: Neurology

## 2016-11-06 ENCOUNTER — Ambulatory Visit: Payer: Medicare Other | Admitting: Neurology

## 2016-11-08 ENCOUNTER — Ambulatory Visit (INDEPENDENT_AMBULATORY_CARE_PROVIDER_SITE_OTHER): Payer: Medicare Other | Admitting: Family Medicine

## 2016-11-14 ENCOUNTER — Ambulatory Visit (INDEPENDENT_AMBULATORY_CARE_PROVIDER_SITE_OTHER): Payer: Medicare Other | Admitting: Family Medicine

## 2016-11-14 VITALS — BP 121/85 | HR 90 | Temp 98.7°F | Ht 62.0 in | Wt 163.0 lb

## 2016-11-14 DIAGNOSIS — E559 Vitamin D deficiency, unspecified: Secondary | ICD-10-CM | POA: Diagnosis not present

## 2016-11-14 DIAGNOSIS — Z6830 Body mass index (BMI) 30.0-30.9, adult: Secondary | ICD-10-CM | POA: Diagnosis not present

## 2016-11-14 DIAGNOSIS — E669 Obesity, unspecified: Secondary | ICD-10-CM | POA: Diagnosis not present

## 2016-11-14 DIAGNOSIS — F3289 Other specified depressive episodes: Secondary | ICD-10-CM

## 2016-11-14 MED ORDER — ESCITALOPRAM OXALATE 10 MG PO TABS: 10.0000 mg | ORAL_TABLET | Freq: Every day | ORAL | 0 refills | Status: DC

## 2016-11-14 MED ORDER — VITAMIN D-3 25 MCG (1000 UT) PO CAPS: 1.0000 | ORAL_CAPSULE | Freq: Every day | ORAL | 0 refills | Status: DC

## 2016-11-15 NOTE — Progress Notes (Signed)
Office: [phone]  /  Fax: [phone]   HPI:   Chief Complaint: OBESITY Donna Shannon is here to discuss her progress with her obesity treatment plan. She is on the keep a food journal plan and is following her eating plan approximately 85 % of the time. She states she is walking 30 minutes 2 times per week. Donna Shannon is doing well with weight loss. She is journaling and working on increasing protein. She states she is eating all her calories. She increased walking for exercise. Her weight is 163 lb (73.9 kg) today and has had a weight loss of 9 pounds over a period of 3 weeks since her last visit. She has lost 30 lbs since starting treatment with [place].  Vitamin D deficiency Lynasia has a diagnosis of vitamin D deficiency. She is currently stable on vit D and denies nausea, vomiting or muscle weakness.  Depression with emotional eating behaviors Donna Shannon is struggling with emotional eating and using food for comfort to the extent that it is negatively impacting her health. She often snacks when she is not hungry. Donna Shannon sometimes feels she is out of control and then feels guilty that she made poor food choices. She feels she is doing better with Lexapro. She is still stressed with her health issues. Donna Shannon has been working on behavior modification techniques to help reduce her emotional eating and has been somewhat successful. She shows no sign of suicidal or homicidal ideations.  Depression screen [facility] 2/9 08/22/2016  Decreased Interest 2  Down, Depressed, Hopeless 1  PHQ - 2 Score 3  Altered sleeping 1  Tired, decreased energy 2  Change in appetite 1  Feeling bad or failure about yourself  0  Trouble concentrating 1  Moving slowly or fidgety/restless 1  Suicidal thoughts 0  PHQ-9 Score 9      Wt Readings from Last 500 Encounters:  11/14/16 163 lb (73.9 kg)  10/24/16 172 lb (78 kg)  10/11/16 173 lb (78.5 kg)  09/27/16 176 lb (79.8 kg)  09/24/16 183 lb (83 kg)    09/10/16 180 lb (81.6 kg)  08/22/16 193 lb (87.5 kg)  08/06/16 198 lb 3.2 oz (89.9 kg)  07/15/16 197 lb 12 oz (89.7 kg)  06/25/16 204 lb (92.5 kg)  06/21/16 210 lb 12.8 oz (95.6 kg)  04/30/16 216 lb 14.9 oz (98.4 kg)     ALLERGIES: Allergies  Allergen Reactions  . Sulfa Antibiotics Itching    MEDICATIONS: Current Outpatient Prescriptions on File Prior to Visit  Medication Sig Dispense Refill  . acetaZOLAMIDE (DIAMOX) 250 MG tablet Take 2 tablets (500 mg total) by mouth 2 (two) times daily. 360 tablet 3  . aspirin 81 MG chewable tablet Chew 81 mg by mouth daily.    . butalbital-acetaminophen-caffeine (FIORICET, ESGIC) 50-325-40 MG tablet Take 1 tablet by mouth 2 (two) times daily as needed for headache.    . ciprofloxacin (CIPRO) 500 MG tablet Take 500 mg by mouth 2 (two) times daily.    . clonazePAM (KLONOPIN) 0.5 MG tablet Take 0.5 mg by mouth See admin instructions. Take 2 tablets in the morning, then take one in the afternoon, then take 2 tablets at night per patient    . cycloSPORINE (SANDIMMUNE) 100 MG capsule Take 100 mg by mouth 2 (two) times daily.    . cycloSPORINE (SANDIMMUNE) 25 MG capsule Take 25 mg by mouth every morning.    . fenofibrate 160 MG tablet Take 160 mg by mouth daily.    . flavoxATE (  URISPAS) 100 MG tablet Take 100 mg by mouth 3 (three) times daily. Take every day per patient    . gabapentin (NEURONTIN) 300 MG capsule Take 300 mg by mouth daily.     . magnesium oxide (MAG-OX) 400 MG tablet Take 400 mg by mouth daily.     . methylphenidate (RITALIN) 10 MG tablet Take 10 mg by mouth 2 (two) times daily.    . metoprolol (LOPRESSOR) 50 MG tablet Take 1 tablet (50 mg total) by mouth 2 (two) times daily. (Patient taking differently: Take 50 mg by mouth daily. ) 60 tablet 1  . mirabegron ER (MYRBETRIQ) 50 MG TB24 tablet Take 50 mg by mouth at bedtime.    . mycophenolate (CELLCEPT) 500 MG tablet Take 500 mg by mouth 2 (two) times daily.    [name]. omega-3 acid ethyl  esters (LOVAZA) 1 g capsule Take 1 g by mouth 3 (three) times daily.    . ondansetron (ZOFRAN) 8 MG tablet Take 0.5 tablets (4 mg total) by mouth every 8 (eight) hours as needed for nausea or vomiting. 20 tablet 0  . pravastatin (PRAVACHOL) 40 MG tablet Take 40 mg by mouth at bedtime.    . predniSONE (DELTASONE) 2.5 MG tablet Take 2.5 mg by mouth daily with breakfast.    . rizatriptan (MAXALT) 10 MG tablet Take 1 tablet (10 mg total) by mouth daily as needed for migraine. May repeat in 2 hours if needed 10 tablet 6  . sertraline (ZOLOFT) 100 MG tablet Take 100 mg by mouth 2 (two) times daily.    . traMADol (ULTRAM) 50 MG tablet Take 50 mg by mouth 2 (two) times daily.     . furosemide (LASIX) 40 MG tablet Take 1 tablet (40 mg total) by mouth 2 (two) times daily. (Patient taking differently: Take 20 mg by mouth daily. ) 30 tablet 1   No current facility-administered medications on file prior to visit.     PAST MEDICAL HISTORY: Past Medical History:  Diagnosis Date  . Anemia   . Anxiety   . Arthritis    "left knee" (04/24/2016)  . Back pain   . Chronic lower back pain   . Constipation   . Depression   . Edema   . Essential hypertension   . Factor V Leiden (HCC)    [name]/notes 04/24/2016  . Frequent UTI    [name]/notes 04/24/2016  . GERD (gastroesophageal reflux disease)   . Gout   . High cholesterol   . HTN (hypertension)   . Kidney disease   . Migraine    "weekly" (04/24/2016)  . Neurogenic bladder    augmented bladder, I/O self caths  . On home oxygen therapy    "2L; every night" (04/24/2016)  . Osteoporosis   . Ovarian cyst dx'd 04/22/2016   left  . Palpitations   . Pneumonia 03/2016   [name]/notes 04/24/2016  . Renal transplant failure and rejection    age 33-11  . 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 Self-catheterizes urinary bladder    "~ 12 X/day" (04/24/2016)  . SOB (shortness of breath)   . Stroke [facility](HCC) 1991   denies  residual on 04/24/2016  . Vitamin D deficiency     PAST SURGICAL HISTORY: Past Surgical History:  Procedure Laterality Date  . ABDOMINAL HERNIA REPAIR  "several; when I was little"  . allograph biopsy  2013   [name]/notes 04/24/2016  . HERNIA REPAIR    .  KIDNEY TRANSPLANT  1996; 1998   father was donor; mother was donor/notes 04/24/2016  . PACEMAKER INSERTION  "?early 2000s"   for bladder function  . PARTIAL NEPHRECTOMY  1990s X 5   "removing disease"  . PORTACATH PLACEMENT Right 11/2015  . RIGHT OOPHORECTOMY Right 2001   ovarian torsion    SOCIAL HISTORY: Social History  Substance Use Topics  . Smoking status: Former Smoker    Packs/day: 0.50    Years: 4.00    Types: Cigarettes    Quit date: 07/09/2014  . Smokeless tobacco: Never Used  . Alcohol use Yes     Comment: 04/24/2016 "might have 2 drinks/month, if that"    FAMILY HISTORY: Family History  Problem Relation Age of Onset  . Hypertension Mother   . CAD Father        s/p CABG  . Hyperlipidemia Father   . Heart disease Father   . Thyroid disease Father     ROS: Review of Systems  Constitutional: Positive for weight loss.  Gastrointestinal: Negative for nausea and vomiting.  Musculoskeletal:       Negative muscle weakness  Psychiatric/Behavioral: Positive for depression. Negative for suicidal ideas.       Stress    PHYSICAL EXAM: Blood pressure 121/85, pulse 90, temperature 98.7 F (37.1 C), temperature source Oral, height 5\' 2"  (1.575 m), weight 163 lb (73.9 kg), SpO2 96 %. Body mass index is 29.81 kg/m. Physical Exam  Constitutional: She is oriented to person, place, and time. She appears well-developed and well-nourished.  Cardiovascular: Normal rate.   Pulmonary/Chest: Effort normal.  Musculoskeletal: Normal range of motion.  Neurological: She is oriented to person, place, and time.  Skin: Skin is warm and dry.  Vitals reviewed.   RECENT LABS AND TESTS: BMET    Component Value Date/Time   NA  142 08/22/2016 1440   K 3.9 08/22/2016 1440   CL 102 08/22/2016 1440   CO2 23 08/22/2016 1440   GLUCOSE 102 (H) 08/22/2016 1440   GLUCOSE 107 (H) 07/15/2016 0302   BUN 39 (H) 08/22/2016 1440   CREATININE 2.37 (H) 08/22/2016 1440   CALCIUM 9.5 08/22/2016 1440   GFRNONAA 26 (L) 08/22/2016 1440   GFRAA 30 (L) 08/22/2016 1440   Lab Results  Component Value Date   HGBA1C 5.1 08/22/2016   Lab Results  Component Value Date   INSULIN 42.5 (H) 08/22/2016   CBC    Component Value Date/Time   WBC 5.8 08/22/2016 1440   WBC 5.2 07/15/2016 0302   RBC 3.60 (L) 08/22/2016 1440   RBC 3.92 07/15/2016 0302   HGB 11.7 (L) 07/15/2016 0302   HCT 33.0 (L) 08/22/2016 1440   PLT 203 07/15/2016 0302   PLT 228 06/21/2016 1623   MCV 92 08/22/2016 1440   MCH 29.7 08/22/2016 1440   MCH 29.8 07/15/2016 0302   MCHC 32.4 08/22/2016 1440   MCHC 32.6 07/15/2016 0302   RDW 14.5 08/22/2016 1440   LYMPHSABS 1.9 08/22/2016 1440   MONOABS 0.4 07/11/2016 1846   EOSABS 0.1 08/22/2016 1440   BASOSABS 0.0 08/22/2016 1440   Iron/TIBC/Ferritin/ %Sat No results found for: IRON, TIBC, FERRITIN, IRONPCTSAT Lipid Panel  No results found for: CHOL, TRIG, HDL, CHOLHDL, VLDL, LDLCALC, LDLDIRECT Hepatic Function Panel     Component Value Date/Time   PROT 7.2 08/22/2016 1440   ALBUMIN 4.5 08/22/2016 1440   AST 37 08/22/2016 1440   ALT 24 08/22/2016 1440   ALKPHOS 41 08/22/2016 1440  BILITOT 0.5 08/22/2016 1440      Component Value Date/Time   TSH 0.019 (L) 08/22/2016 1440   TSH 3.890 04/25/2016 0157    ASSESSMENT AND PLAN: Other depression - Plan: escitalopram (LEXAPRO) 10 MG tablet  Vitamin D deficiency - Plan: Cholecalciferol (VITAMIN D-3) 1000 units CAPS  Class 1 obesity without serious comorbidity with body mass index (BMI) of 30.0 to 30.9 in adult, unspecified obesity type  PLAN:  Vitamin D Deficiency Lachele was informed that low vitamin D levels contributes to fatigue and are associated  with obesity, breast, and colon cancer. She agrees to continue to take prescription Vit D @50 ,000 IU every week, we will refill for 1 month and will follow up for routine testing of vitamin D, at least 2-3 times per year. She was informed of the risk of over-replacement of vitamin D and agrees to not increase her dose unless he discusses this with [place] first. Sparkles agrees to follow up with our clinic in 2 to 3 weeks.  Depression with Emotional Eating Behaviors We discussed behavior modification techniques today to help Colinda deal with her emotional eating and depression. She has agreed to continue to take Lexapro10 mg qd 330 with no refills and she agreed to follow up as directed.  Obesity Dee is currently in the action stage of change. As such, her goal is to continue with weight loss efforts She has agreed to keep a food journal with 450 to 550 calories and 35+ grams of protein daily Gerardo has been instructed to work up to a goal of 150 minutes of combined cardio and strengthening exercise per week for weight loss and overall health benefits. We discussed the following Behavioral Modification Stratagies today: increasing lean protein intake and decreasing simple carbohydrates   Alease has agreed to follow up with our clinic in 2 to 3 weeks. She was informed of the importance of frequent follow up visits to maximize her success with intensive lifestyle modifications for her multiple health conditions.  I, Nevada Crane, am acting as scribe for Quillian Quince, MD  I have reviewed the above documentation for accuracy and completeness, and I agree with the above. -Quillian Quince, MD

## 2016-11-28 ENCOUNTER — Encounter (INDEPENDENT_AMBULATORY_CARE_PROVIDER_SITE_OTHER): Payer: Self-pay

## 2016-11-28 ENCOUNTER — Ambulatory Visit (INDEPENDENT_AMBULATORY_CARE_PROVIDER_SITE_OTHER): Payer: Medicare Other | Admitting: Family Medicine

## 2016-11-29 ENCOUNTER — Ambulatory Visit (INDEPENDENT_AMBULATORY_CARE_PROVIDER_SITE_OTHER): Payer: Medicare Other | Admitting: Family Medicine

## 2016-11-29 VITALS — BP 116/81 | HR 85 | Temp 98.3°F | Ht 62.0 in | Wt 165.0 lb

## 2016-11-29 DIAGNOSIS — E669 Obesity, unspecified: Secondary | ICD-10-CM | POA: Diagnosis not present

## 2016-11-29 DIAGNOSIS — F3289 Other specified depressive episodes: Secondary | ICD-10-CM

## 2016-11-29 DIAGNOSIS — E66811 Obesity, class 1: Secondary | ICD-10-CM | POA: Insufficient documentation

## 2016-11-29 DIAGNOSIS — Z6830 Body mass index (BMI) 30.0-30.9, adult: Secondary | ICD-10-CM

## 2016-11-29 MED ORDER — ESCITALOPRAM OXALATE 10 MG PO TABS: 10.0000 mg | ORAL_TABLET | Freq: Every day | ORAL | 0 refills | Status: DC

## 2016-11-29 NOTE — Progress Notes (Signed)
Office: [phone]  /  Fax: [phone]   HPI:   Chief Complaint: OBESITY Donna Shannon is here to discuss her progress with her obesity treatment plan. She is on the  keep a food journal and is following her eating plan approximately 90 % of the time. She states she is walking 2 to 3 times per week. Donna Shannon was on vacation and increased eating out. She is up 2 pounds but this is fluid, not fat gain. Donna Shannon is ready to get back on track. Her weight is 165 lb (74.8 kg) today and has had a weight gain of 2 pounds over a period of 2 weeks since her last visit. She has lost 28 lbs since starting treatment with [place].  Depression with emotional eating behaviors Donna Shannon is tearful today because she slightly gained weight. She feels her Lexapro has helped her feel in control overall. Donna Shannon struggles with emotional eating and using food for comfort to the extent that it is negatively impacting her health. She often snacks when she is not hungry. Donna Shannon sometimes feels she is out of control and then feels guilty that she made poor food choices. She has been working on behavior modification techniques to help reduce her emotional eating and has been somewhat successful. She shows no sign of suicidal or homicidal ideations.  Depression screen PHQ 2/9 08/22/2016  Decreased Interest 2  Down, Depressed, Hopeless 1  PHQ - 2 Score 3  Altered sleeping 1  Tired, decreased energy 2  Change in appetite 1  Feeling bad or failure about yourself  0  Trouble concentrating 1  Moving slowly or fidgety/restless 1  Suicidal thoughts 0  PHQ-9 Score 9      ALLERGIES: Allergies  Allergen Reactions  . Sulfa Antibiotics Itching    MEDICATIONS: Current Outpatient Prescriptions on File Prior to Visit  Medication Sig Dispense Refill  . aspirin 81 MG chewable tablet Chew 81 mg by mouth daily.    . Cholecalciferol (VITAMIN D-3) 1000 units CAPS Take 1 capsule (1,000 Units total) by mouth daily. 30  capsule 0  . ciprofloxacin (CIPRO) 500 MG tablet Take 500 mg by mouth 2 (two) times daily.    . clonazePAM (KLONOPIN) 0.5 MG tablet Take 0.5 mg by mouth See admin instructions. Take 2 tablets in the morning, then take one in the afternoon, then take 2 tablets at night per patient    . cycloSPORINE (SANDIMMUNE) 100 MG capsule Take 100 mg by mouth 2 (two) times daily.    . cycloSPORINE (SANDIMMUNE) 25 MG capsule Take 25 mg by mouth every morning.    . escitalopram (LEXAPRO) 10 MG tablet Take 1 tablet (10 mg total) by mouth daily. 30 tablet 0  . fenofibrate 160 MG tablet Take 160 mg by mouth daily.    . flavoxATE (URISPAS) 100 MG tablet Take 100 mg by mouth 3 (three) times daily. Take every day per patient    . gabapentin (NEURONTIN) 300 MG capsule Take 300 mg by mouth daily.     . magnesium oxide (MAG-OX) 400 MG tablet Take 400 mg by mouth daily.     . methylphenidate (RITALIN) 10 MG tablet Take 10 mg by mouth 2 (two) times daily.    . metoprolol (LOPRESSOR) 50 MG tablet Take 1 tablet (50 mg total) by mouth 2 (two) times daily. (Patient taking differently: Take 50 mg by mouth daily. ) 60 tablet 1  . mirabegron ER (MYRBETRIQ) 50 MG TB24 tablet Take 50 mg by mouth at bedtime.    [name]  mycophenolate (CELLCEPT) 500 MG tablet Take 500 mg by mouth 2 (two) times daily.    [name]. omega-3 acid ethyl esters (LOVAZA) 1 g capsule Take 1 g by mouth 3 (three) times daily.    . ondansetron (ZOFRAN) 8 MG tablet Take 0.5 tablets (4 mg total) by mouth every 8 (eight) hours as needed for nausea or vomiting. 20 tablet 0  . pravastatin (PRAVACHOL) 40 MG tablet Take 40 mg by mouth at bedtime.    . predniSONE (DELTASONE) 2.5 MG tablet Take 2.5 mg by mouth daily with breakfast.    . rizatriptan (MAXALT) 10 MG tablet Take 1 tablet (10 mg total) by mouth daily as needed for migraine. May repeat in 2 hours if needed 10 tablet 6  . sertraline (ZOLOFT) 100 MG tablet Take 100 mg by mouth 2 (two) times daily.    . traMADol (ULTRAM) 50 MG  tablet Take 50 mg by mouth 2 (two) times daily.     [name]. acetaZOLAMIDE (DIAMOX) 250 MG tablet Take 2 tablets (500 mg total) by mouth 2 (two) times daily. (Patient taking differently: Take 500 mg by mouth daily. ) 360 tablet 3  . furosemide (LASIX) 40 MG tablet Take 1 tablet (40 mg total) by mouth 2 (two) times daily. (Patient taking differently: Take 20 mg by mouth daily. ) 30 tablet 1   No current facility-administered medications on file prior to visit.     PAST MEDICAL HISTORY: Past Medical History:  Diagnosis Date  . Anemia   . Anxiety   . Arthritis    "left knee" (04/24/2016)  . Back pain   . Chronic lower back pain   . Constipation   . Depression   . Edema   . Essential hypertension   . Factor V Leiden (HCC)    Donna Shannon 04/24/2016  . Frequent UTI    Donna Shannon 04/24/2016  . GERD (gastroesophageal reflux disease)   . Gout   . High cholesterol   . HTN (hypertension)   . Kidney disease   . Migraine    "weekly" (04/24/2016)  . Neurogenic bladder    augmented bladder, I/O self caths  . On home oxygen therapy    "2L; every night" (04/24/2016)  . Osteoporosis   . Ovarian cyst dx'd 04/22/2016   left  . Palpitations   . Pneumonia 03/2016   Donna Shannon 04/24/2016  . Renal transplant failure and rejection    age 33-11  . 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 Self-catheterizes urinary bladder    "~ 12 X/day" (04/24/2016)  . SOB (shortness of breath)   . Stroke [facility]) 1991   denies residual on 04/24/2016  . Vitamin D deficiency     PAST SURGICAL HISTORY: Past Surgical History:  Procedure Laterality Date  . ABDOMINAL HERNIA REPAIR  "several; when I was little"  . allograph biopsy  2013   Donna Shannon 04/24/2016  . HERNIA REPAIR    . KIDNEY TRANSPLANT  1996; 1998   father was donor; mother was donor/notes 04/24/2016  . PACEMAKER INSERTION  "?early 2000s"   for bladder function  . PARTIAL NEPHRECTOMY  1990s X 5   "removing  disease"  . PORTACATH PLACEMENT Right 11/2015  . RIGHT OOPHORECTOMY Right 2001   ovarian torsion    SOCIAL HISTORY: Social History  Substance Use Topics  . Smoking status: Former Smoker    Packs/day: 0.50    Years: 4.00    Types: Cigarettes  Quit date: 07/09/2014  . Smokeless tobacco: Never Used  . Alcohol use Yes     Comment: 04/24/2016 "might have 2 drinks/month, if that"    FAMILY HISTORY: Family History  Problem Relation Age of Onset  . Hypertension Mother   . CAD Father        s/p CABG  . Hyperlipidemia Father   . Heart disease Father   . Thyroid disease Father     ROS: Review of Systems  Constitutional: Negative for weight loss.  Psychiatric/Behavioral: Positive for depression. Negative for suicidal ideas.    PHYSICAL EXAM: Blood pressure 116/81, pulse 85, temperature 98.3 F (36.8 C), temperature source Oral, height 5\' 2"  (1.575 m), weight 165 lb (74.8 kg), SpO2 99 %. Body mass index is 30.18 kg/m. Physical Exam  Constitutional: She is oriented to person, place, and time. She appears well-developed and well-nourished.  Cardiovascular: Normal rate.   Pulmonary/Chest: Effort normal.  Musculoskeletal: Normal range of motion.  Neurological: She is oriented to person, place, and time.  Skin: Skin is warm and dry.  Vitals reviewed.   RECENT LABS AND TESTS: BMET    Component Value Date/Time   NA 142 08/22/2016 1440   K 3.9 08/22/2016 1440   CL 102 08/22/2016 1440   CO2 23 08/22/2016 1440   GLUCOSE 102 (H) 08/22/2016 1440   GLUCOSE 107 (H) 07/15/2016 0302   BUN 39 (H) 08/22/2016 1440   CREATININE 2.37 (H) 08/22/2016 1440   CALCIUM 9.5 08/22/2016 1440   GFRNONAA 26 (L) 08/22/2016 1440   GFRAA 30 (L) 08/22/2016 1440   Lab Results  Component Value Date   HGBA1C 5.1 08/22/2016   Lab Results  Component Value Date   INSULIN 42.5 (H) 08/22/2016   CBC    Component Value Date/Time   WBC 5.8 08/22/2016 1440   WBC 5.2 07/15/2016 0302   RBC 3.60 (L)  08/22/2016 1440   RBC 3.92 07/15/2016 0302   HGB 11.7 (L) 07/15/2016 0302   HCT 33.0 (L) 08/22/2016 1440   PLT 203 07/15/2016 0302   PLT 228 06/21/2016 1623   MCV 92 08/22/2016 1440   MCH 29.7 08/22/2016 1440   MCH 29.8 07/15/2016 0302   MCHC 32.4 08/22/2016 1440   MCHC 32.6 07/15/2016 0302   RDW 14.5 08/22/2016 1440   LYMPHSABS 1.9 08/22/2016 1440   MONOABS 0.4 07/11/2016 1846   EOSABS 0.1 08/22/2016 1440   BASOSABS 0.0 08/22/2016 1440   Iron/TIBC/Ferritin/ %Sat No results found for: IRON, TIBC, FERRITIN, IRONPCTSAT Lipid Panel  No results found for: CHOL, TRIG, HDL, CHOLHDL, VLDL, LDLCALC, LDLDIRECT Hepatic Function Panel     Component Value Date/Time   PROT 7.2 08/22/2016 1440   ALBUMIN 4.5 08/22/2016 1440   AST 37 08/22/2016 1440   ALT 24 08/22/2016 1440   ALKPHOS 41 08/22/2016 1440   BILITOT 0.5 08/22/2016 1440      Component Value Date/Time   TSH 0.019 (L) 08/22/2016 1440   TSH 3.890 04/25/2016 0157    ASSESSMENT AND PLAN: Other depression - Plan: escitalopram (LEXAPRO) 10 MG tablet  Class 1 obesity with serious comorbidity and body mass index (BMI) of 30.0 to 30.9 in adult, unspecified obesity type  PLAN:  Depression with Emotional Eating Behaviors We discussed behavior modification techniques today to help Donna Shannon deal with her emotional eating and depression. She has agreed to continue to take Lexapro 10 mg qd, we will refill for 1 month and she agreed to follow up as directed.  Obesity Donna Shannon is  currently in the action stage of change. As such, her goal is to continue with weight loss efforts She has agreed to keep a food journal with 350 to 500 calories and 30+ grams of protein  and follow the Category 1 plan Donna Shannon has been instructed to work up to a goal of 150 minutes of combined cardio and strengthening exercise per week for weight loss and overall health benefits. We discussed the following Behavioral Modification Strategies today:  increasing lean protein intake, decrease eating out and emotional eating strategies  Donna Shannon has agreed to follow up with our clinic in 2 weeks. She was informed of the importance of frequent follow up visits to maximize her success with intensive lifestyle modifications for her multiple health conditions.  I, Donna Shannon, am acting as scribe for Donna Quince, MD  I have reviewed the above documentation for accuracy and completeness, and I agree with the above. -Donna Quince, MD   OBESITY BEHAVIORAL INTERVENTION VISIT  Today's visit was # 7 out of 22.  Starting weight: 193 lbs Starting date: 08/22/16 Todays weight : 165 Total lbs lost to date: 28 lbs (Patients must lose 7 lbs in the first 6 months to continue with counseling)   ASK: We discussed the diagnosis of obesity with Donna Shannon today and Donna Shannon agreed to give [place] permission to discuss obesity behavioral modification therapy today.  ASSESS: Donna Shannon has the diagnosis of obesity and her BMI today is 30.2 Donna Shannon is in the action stage of change   ADVISE: Donna Shannon was educated on the multiple health risks of obesity as well as the benefit of weight loss to improve her health. She was advised of the need for long term treatment and the importance of lifestyle modifications.  AGREE: Multiple dietary modification options and treatment options were discussed and  Donna Shannon agreed to keep a food journal with 350 to 500 calories and 30+ grams of protein at supper and follow the Category 1 plan We discussed the following Behavioral Modification Strategies today: increasing lean protein intake, decrease eating out and emotional eating strategies

## 2016-12-06 DIAGNOSIS — N971 Female infertility of tubal origin: Secondary | ICD-10-CM | POA: Insufficient documentation

## 2016-12-13 ENCOUNTER — Ambulatory Visit (INDEPENDENT_AMBULATORY_CARE_PROVIDER_SITE_OTHER): Payer: Medicare Other | Admitting: Family Medicine

## 2016-12-13 VITALS — BP 103/71 | HR 90 | Temp 98.2°F | Ht 62.0 in | Wt 162.0 lb

## 2016-12-13 DIAGNOSIS — F3289 Other specified depressive episodes: Secondary | ICD-10-CM

## 2016-12-13 DIAGNOSIS — E559 Vitamin D deficiency, unspecified: Secondary | ICD-10-CM | POA: Diagnosis not present

## 2016-12-13 DIAGNOSIS — E669 Obesity, unspecified: Secondary | ICD-10-CM

## 2016-12-13 DIAGNOSIS — Z6830 Body mass index (BMI) 30.0-30.9, adult: Secondary | ICD-10-CM

## 2016-12-13 MED ORDER — ESCITALOPRAM OXALATE 20 MG PO TABS
20.0000 mg | ORAL_TABLET | Freq: Every day | ORAL | 0 refills | Status: DC
Start: 2016-12-13 — End: 2017-01-10

## 2016-12-13 MED ORDER — VITAMIN D-3 25 MCG (1000 UT) PO CAPS: 1.0000 | ORAL_CAPSULE | Freq: Every day | ORAL | 0 refills | Status: DC

## 2016-12-17 NOTE — Progress Notes (Addendum)
Office: [phone]  /  Fax: [phone]   HPI:   Chief Complaint: OBESITY Donna Shannon is here to discuss her progress with her obesity treatment plan. She is on the  keep a food journal with 350 to 500 calories and 30+ grams of protein at supper and follow the Category 1 plan and is following her eating plan approximately 85 % of the time. She states she is walking 30 minutes 2 times per week. Donna Shannon continues to do well with weight loss. She is working on increasing protein and dealing with family sabotage. She has decreased emotional eating and is happy about her weight loss. Energy is better. Her weight is 162 lb (73.5 kg) today and has had a weight loss of 3 pounds over a period of 2 weeks since her last visit. She has lost 31 lbs since starting treatment with [place].  Vitamin D deficiency Donna Shannon has a diagnosis of vitamin D deficiency. She is currently stable on vit D, not yet at goal and denies nausea, vomiting or muscle weakness.  Depression with emotional eating behaviors Donna Shannon feels she is doing better on lexapro but doesn't feel she is getting benefit from zoloft anymore. Donna Shannon struggles with emotional eating and using food for comfort to the extent that it is negatively impacting her health. She often snacks when she is not hungry. Donna Shannon sometimes feels she is out of control and then feels guilty that she made poor food choices. She has been working on behavior modification techniques to help reduce her emotional eating and has been somewhat successful. She shows no sign of suicidal or homicidal ideations.  Depression screen PHQ 2/9 08/22/2016  Decreased Interest 2  Down, Depressed, Hopeless 1  PHQ - 2 Score 3  Altered sleeping 1  Tired, decreased energy 2  Change in appetite 1  Feeling bad or failure about yourself  0  Trouble concentrating 1  Moving slowly or fidgety/restless 1  Suicidal thoughts 0  PHQ-9 Score 9     ALLERGIES: Allergies  Allergen  Reactions  . Sulfa Antibiotics Itching    MEDICATIONS: Current Outpatient Prescriptions on File Prior to Visit  Medication Sig Dispense Refill  . aspirin 81 MG chewable tablet Chew 81 mg by mouth daily.    . ciprofloxacin (CIPRO) 500 MG tablet Take 500 mg by mouth 2 (two) times daily.    . clonazePAM (KLONOPIN) 0.5 MG tablet Take 0.5 mg by mouth See admin instructions. Take 2 tablets in the morning, then take one in the afternoon, then take 2 tablets at night per patient    . cycloSPORINE (SANDIMMUNE) 100 MG capsule Take 100 mg by mouth 2 (two) times daily.    . cycloSPORINE (SANDIMMUNE) 25 MG capsule Take 25 mg by mouth every morning.    . fenofibrate 160 MG tablet Take 160 mg by mouth daily.    . flavoxATE (URISPAS) 100 MG tablet Take 100 mg by mouth 3 (three) times daily. Take every day per patient    . gabapentin (NEURONTIN) 300 MG capsule Take 300 mg by mouth daily.     . magnesium oxide (MAG-OX) 400 MG tablet Take 400 mg by mouth daily.     . methylphenidate (RITALIN) 10 MG tablet Take 10 mg by mouth 2 (two) times daily.    . metoprolol (LOPRESSOR) 50 MG tablet Take 1 tablet (50 mg total) by mouth 2 (two) times daily. (Patient taking differently: Take 50 mg by mouth daily. ) 60 tablet 1  . mirabegron ER (MYRBETRIQ)  50 MG TB24 tablet Take 50 mg by mouth at bedtime.    . mycophenolate (CELLCEPT) 500 MG tablet Take 500 mg by mouth 2 (two) times daily.    [name] omega-3 acid ethyl esters (LOVAZA) 1 g capsule Take 1 g by mouth 3 (three) times daily.    . ondansetron (ZOFRAN) 8 MG tablet Take 0.5 tablets (4 mg total) by mouth every 8 (eight) hours as needed for nausea or vomiting. 20 tablet 0  . pravastatin (PRAVACHOL) 40 MG tablet Take 40 mg by mouth at bedtime.    . predniSONE (DELTASONE) 2.5 MG tablet Take 2.5 mg by mouth daily with breakfast.    . rizatriptan (MAXALT) 10 MG tablet Take 1 tablet (10 mg total) by mouth daily as needed for migraine. May repeat in 2 hours if needed 10 tablet 6  .  sertraline (ZOLOFT) 100 MG tablet Take 100 mg by mouth 2 (two) times daily. 1/2 pill in the AM, one pill in the PM    . traMADol (ULTRAM) 50 MG tablet Take 50 mg by mouth 2 (two) times daily.     [name] acetaZOLAMIDE (DIAMOX) 250 MG tablet Take 2 tablets (500 mg total) by mouth 2 (two) times daily. (Patient taking differently: Take 500 mg by mouth daily. ) 360 tablet 3  . furosemide (LASIX) 40 MG tablet Take 1 tablet (40 mg total) by mouth 2 (two) times daily. (Patient taking differently: Take 20 mg by mouth daily. ) 30 tablet 1   No current facility-administered medications on file prior to visit.     PAST MEDICAL HISTORY: Past Medical History:  Diagnosis Date  . Anemia   . Anxiety   . Arthritis    "left knee" (04/24/2016)  . Back pain   . Chronic lower back pain   . Constipation   . Depression   . Edema   . Essential hypertension   . Factor V Leiden (HCC)    Donna Shannon 04/24/2016  . Frequent UTI    Donna Shannon 04/24/2016  . GERD (gastroesophageal reflux disease)   . Gout   . High cholesterol   . HTN (hypertension)   . Kidney disease   . Migraine    "weekly" (04/24/2016)  . Neurogenic bladder    augmented bladder, I/O self caths  . On home oxygen therapy    "2L; every night" (04/24/2016)  . Osteoporosis   . Ovarian cyst dx'd 04/22/2016   left  . Palpitations   . Pneumonia 03/2016   Donna Shannon 04/24/2016  . Renal transplant failure and rejection    age 89-11  . 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 Self-catheterizes urinary bladder    "~ 12 X/day" (04/24/2016)  . SOB (shortness of breath)   . Stroke [facility]) 1991   denies residual on 04/24/2016  . Vitamin D deficiency     PAST SURGICAL HISTORY: Past Surgical History:  Procedure Laterality Date  . ABDOMINAL HERNIA REPAIR  "several; when I was little"  . allograph biopsy  2013   Donna Shannon 04/24/2016  . HERNIA REPAIR    . KIDNEY TRANSPLANT  1996; 1998   father was donor; mother  was donor/notes 04/24/2016  . PACEMAKER INSERTION  "?early 2000s"   for bladder function  . PARTIAL NEPHRECTOMY  1990s X 5   "removing disease"  . PORTACATH PLACEMENT Right 11/2015  . RIGHT OOPHORECTOMY Right 2001   ovarian torsion    SOCIAL HISTORY: Social History  Substance Use Topics  . Smoking status: Former Smoker    Packs/day: 0.50    Years: 4.00    Types: Cigarettes    Quit date: 07/09/2014  . Smokeless tobacco: Never Used  . Alcohol use Yes     Comment: 04/24/2016 "might have 2 drinks/month, if that"    FAMILY HISTORY: Family History  Problem Relation Age of Onset  . Hypertension Mother   . CAD Father        s/p CABG  . Hyperlipidemia Father   . Heart disease Father   . Thyroid disease Father     ROS: Review of Systems  Constitutional: Positive for weight loss.  Gastrointestinal: Negative for nausea and vomiting.  Musculoskeletal:       Negative muscle weakness  Psychiatric/Behavioral: Positive for depression. Negative for suicidal ideas.    PHYSICAL EXAM: Blood pressure 103/71, pulse 90, temperature 98.2 F (36.8 C), temperature source Oral, height 5\' 2"  (1.575 m), weight 162 lb (73.5 kg), SpO2 99 %. Body mass index is 29.63 kg/m. Physical Exam  Constitutional: She is oriented to person, place, and time. She appears well-developed and well-nourished.  Cardiovascular: Normal rate.   Pulmonary/Chest: Effort normal.  Musculoskeletal: Normal range of motion.  Neurological: She is oriented to person, place, and time.  Skin: Skin is warm and dry.  Psychiatric: She has a normal mood and affect. Her behavior is normal.  Vitals reviewed.   RECENT LABS AND TESTS: BMET    Component Value Date/Time   NA 142 08/22/2016 1440   K 3.9 08/22/2016 1440   CL 102 08/22/2016 1440   CO2 23 08/22/2016 1440   GLUCOSE 102 (H) 08/22/2016 1440   GLUCOSE 107 (H) 07/15/2016 0302   BUN 39 (H) 08/22/2016 1440   CREATININE 2.37 (H) 08/22/2016 1440   CALCIUM 9.5  08/22/2016 1440   GFRNONAA 26 (L) 08/22/2016 1440   GFRAA 30 (L) 08/22/2016 1440   Lab Results  Component Value Date   HGBA1C 5.1 08/22/2016   Lab Results  Component Value Date   INSULIN 42.5 (H) 08/22/2016   CBC    Component Value Date/Time   WBC 5.8 08/22/2016 1440   WBC 5.2 07/15/2016 0302   RBC 3.60 (L) 08/22/2016 1440   RBC 3.92 07/15/2016 0302   HGB 10.7 (L) 08/22/2016 1440   HCT 33.0 (L) 08/22/2016 1440   PLT 203 07/15/2016 0302   PLT 228 06/21/2016 1623   MCV 92 08/22/2016 1440   MCH 29.7 08/22/2016 1440   MCH 29.8 07/15/2016 0302   MCHC 32.4 08/22/2016 1440   MCHC 32.6 07/15/2016 0302   RDW 14.5 08/22/2016 1440   LYMPHSABS 1.9 08/22/2016 1440   MONOABS 0.4 07/11/2016 1846   EOSABS 0.1 08/22/2016 1440   BASOSABS 0.0 08/22/2016 1440   Iron/TIBC/Ferritin/ %Sat No results found for: IRON, TIBC, FERRITIN, IRONPCTSAT Lipid Panel  No results found for: CHOL, TRIG, HDL, CHOLHDL, VLDL, LDLCALC, LDLDIRECT Hepatic Function Panel     Component Value Date/Time   PROT 7.2 08/22/2016 1440   ALBUMIN 4.5 08/22/2016 1440   AST 37 08/22/2016 1440   ALT 24 08/22/2016 1440   ALKPHOS 41 08/22/2016 1440   BILITOT 0.5 08/22/2016 1440      Component Value Date/Time   TSH 0.019 (L) 08/22/2016 1440   TSH 3.890 04/25/2016 0157    ASSESSMENT AND PLAN: Vitamin D deficiency - Plan: Cholecalciferol (VITAMIN D-3) 1000 units CAPS  Other depression - Plan: escitalopram (LEXAPRO) 20 MG tablet  Class 1 obesity  without serious comorbidity with body mass index (BMI) of 30.0 to 30.9 in adult, unspecified obesity type - Starting BMI greater then 30  PLAN:  Vitamin D Deficiency Donna Shannon was informed that low vitamin D levels contributes to fatigue and are associated with obesity, breast, and colon cancer. She agrees to continue to take prescription Vit D @1 ,000 IU qd #30 with no refills and will follow up for routine testing of vitamin D, at least 2-3 times per year. She was informed  of the risk of over-replacement of vitamin D and agrees to not increase her dose unless he discusses this with [place] first. Donna Shannon agrees to follow up with our clinic in 2 weeks.  Depression with Emotional Eating Behaviors We discussed behavior modification techniques today to help Donna Shannon deal with her emotional eating and depression. Donna Shannon was warned of signs and symptoms of seratonin syndrome. She agrees to decrease zoloft to 1/2 pill every morning and 1 pill every night and increase lexapro 20 mg qd #30 with no refills and she agreed to follow up as directed.  Obesity Donna Shannon is currently in the action stage of change. As such, her goal is to continue with weight loss efforts She has agreed to keep a food journal with 350 to 500 calories and 30 grams of protein at supper and follow the Category 1 plan Donna Shannon has been instructed to work up to a goal of 150 minutes of combined cardio and strengthening exercise per week for weight loss and overall health benefits. We discussed the following Behavioral Modification Strategies today: meal planning & cooking strategies, increasing lean protein intake and emotional eating strategies  Donna Shannon has agreed to follow up with our clinic in 2 weeks. She was informed of the importance of frequent follow up visits to maximize her success with intensive lifestyle modifications for her multiple health conditions.  I, [place] Crane, am acting as scribe for Quillian Quince, MD  I have reviewed the above documentation for accuracy and completeness, and I agree with the above. -Quillian Quince, MD  OBESITY BEHAVIORAL INTERVENTION VISIT  Today's visit was # 8 out of 22.  Starting weight: 193 lbs Starting date: 08/22/16 Today's weight : 162 lbs Today's date: 12/13/16 Total lbs lost to date: 4 (Patients must lose 7 lbs in the first 6 months to continue with counseling)   ASK: We discussed the diagnosis of obesity with Donna Shannon today and  Donna Shannon agreed to give [place] permission to discuss obesity behavioral modification therapy today.  ASSESS: Donna Shannon has the diagnosis of obesity and her BMI today is 29.7 Donna Shannon is in the action stage of change   ADVISE: Donna Shannon was educated on the multiple health risks of obesity as well as the benefit of weight loss to improve her health. She was advised of the need for long term treatment and the importance of lifestyle modifications.  AGREE: Multiple dietary modification options and treatment options were discussed and  Donna Shannon agreed to keep a food journal with 350 to 500 calories and 30 grams of protein at supper and follow the Category 1 plan We discussed the following Behavioral Modification Strategies today: meal planning & cooking strategies, increasing lean protein intake and emotional eating strategies

## 2017-01-01 ENCOUNTER — Ambulatory Visit (INDEPENDENT_AMBULATORY_CARE_PROVIDER_SITE_OTHER): Payer: Medicare Other | Admitting: Family Medicine

## 2017-01-01 VITALS — BP 97/60 | HR 75 | Temp 98.7°F | Ht 62.0 in | Wt 159.0 lb

## 2017-01-01 DIAGNOSIS — F418 Other specified anxiety disorders: Secondary | ICD-10-CM

## 2017-01-01 DIAGNOSIS — E669 Obesity, unspecified: Secondary | ICD-10-CM

## 2017-01-01 DIAGNOSIS — Z6830 Body mass index (BMI) 30.0-30.9, adult: Secondary | ICD-10-CM

## 2017-01-01 NOTE — Progress Notes (Signed)
Office: [phone]  /  Fax: [phone]   HPI:   Chief Complaint: OBESITY Donna Shannon is here to discuss her progress with her obesity treatment plan. She is on the  keep a food journal with 350 to 500 calories and 30 grams of protein at supper daily and follow the Category 1 plan and is following her eating plan approximately 90 % of the time. She states she is exercising 0 minutes 0 times per week. Donna Shannon continues to do very well with weight loss. She is journaling most days and is very motivated to lose weight. Her weight is 159 lb (72.1 kg) today and has had a weight loss of 3 pounds over a period of 2 to 3 weeks since her last visit. She has lost 34 lbs since starting treatment with [place].  Depression with Anxiety Donna Shannon is on klonopin and zoloft. Her mood seems to have improved as she is working on taking better care of her health, which gives her some control of her chronic serious health issues. She shows no sign of suicidal or homicidal ideations and she is not tearful in office today. Donna Shannon struggles with emotional eating and using food for comfort to the extent that it is negatively impacting her health. She often snacks when she is not hungry. Donna Shannon sometimes feels she is out of control and then feels guilty that she made poor food choices. She has been working on behavior modification techniques to help reduce her emotional eating and has been somewhat successful.   Depression screen PHQ 2/9 08/22/2016  Decreased Interest 2  Down, Depressed, Hopeless 1  PHQ - 2 Score 3  Altered sleeping 1  Tired, decreased energy 2  Change in appetite 1  Feeling bad or failure about yourself  0  Trouble concentrating 1  Moving slowly or fidgety/restless 1  Suicidal thoughts 0  PHQ-9 Score 9     ALLERGIES: Allergies  Allergen Reactions   Sulfa Antibiotics Itching    MEDICATIONS: Current Outpatient Prescriptions on File Prior to Visit  Medication Sig Dispense Refill    acetaminophen-codeine (TYLENOL #4) 300-60 MG tablet Take 1 tablet by mouth every 6 (six) hours as needed for pain.     acetaZOLAMIDE (DIAMOX) 250 MG tablet Take 2 tablets (500 mg total) by mouth 2 (two) times daily. (Patient taking differently: Take 500 mg by mouth daily. ) 360 tablet 3   aspirin 81 MG chewable tablet Chew 81 mg by mouth daily.     Cholecalciferol (VITAMIN D-3) 1000 units CAPS Take 1 capsule (1,000 Units total) by mouth daily. 30 capsule 0   ciprofloxacin (CIPRO) 500 MG tablet Take 500 mg by mouth 2 (two) times daily.     clonazePAM (KLONOPIN) 0.5 MG tablet Take 0.5 mg by mouth See admin instructions. Take 2 tablets in the morning, then take one in the afternoon, then take 2 tablets at night per patient     cycloSPORINE (SANDIMMUNE) 100 MG capsule Take 100 mg by mouth 2 (two) times daily.     cycloSPORINE (SANDIMMUNE) 25 MG capsule Take 25 mg by mouth every morning.     escitalopram (LEXAPRO) 20 MG tablet Take 1 tablet (20 mg total) by mouth daily. 30 tablet 0   fenofibrate 160 MG tablet Take 160 mg by mouth daily.     flavoxATE (URISPAS) 100 MG tablet Take 100 mg by mouth 3 (three) times daily. Take every day per patient     furosemide (LASIX) 40 MG tablet Take 1 tablet (40  mg total) by mouth 2 (two) times daily. (Patient taking differently: Take 20 mg by mouth daily. ) 30 tablet 1   gabapentin (NEURONTIN) 300 MG capsule Take 300 mg by mouth daily.      magnesium oxide (MAG-OX) 400 MG tablet Take 400 mg by mouth daily.      methylphenidate (RITALIN) 10 MG tablet Take 10 mg by mouth 2 (two) times daily.     metoprolol (LOPRESSOR) 50 MG tablet Take 1 tablet (50 mg total) by mouth 2 (two) times daily. (Patient taking differently: Take 50 mg by mouth daily. ) 60 tablet 1   mirabegron ER (MYRBETRIQ) 50 MG TB24 tablet Take 50 mg by mouth at bedtime.     mycophenolate (CELLCEPT) 500 MG tablet Take 500 mg by mouth 2 (two) times daily.     omega-3 acid ethyl esters  (LOVAZA) 1 g capsule Take 1 g by mouth 3 (three) times daily.     ondansetron (ZOFRAN) 8 MG tablet Take 0.5 tablets (4 mg total) by mouth every 8 (eight) hours as needed for nausea or vomiting. 20 tablet 0   pravastatin (PRAVACHOL) 40 MG tablet Take 40 mg by mouth at bedtime.     predniSONE (DELTASONE) 2.5 MG tablet Take 2.5 mg by mouth daily with breakfast.     rizatriptan (MAXALT) 10 MG tablet Take 1 tablet (10 mg total) by mouth daily as needed for migraine. May repeat in 2 hours if needed 10 tablet 6   sertraline (ZOLOFT) 100 MG tablet Take 100 mg by mouth 2 (two) times daily. 1/2 pill in the AM, one pill in the PM     traMADol (ULTRAM) 50 MG tablet Take 50 mg by mouth 2 (two) times daily.      No current facility-administered medications on file prior to visit.     PAST MEDICAL HISTORY: Past Medical History:  Diagnosis Date   Anemia    Anxiety    Arthritis    "left knee" (04/24/2016)   Back pain    Chronic lower back pain    Constipation    Depression    Edema    Essential hypertension    Factor V Leiden (HCC)    [name] 04/24/2016   Frequent UTI    /notes 04/24/2016   GERD (gastroesophageal reflux disease)    Gout    High cholesterol    HTN (hypertension)    Kidney disease    Migraine    "weekly" (04/24/2016)   Neurogenic bladder    augmented bladder, I/O self caths   On home oxygen therapy    "2L; every night" (04/24/2016)   Osteoporosis    Ovarian cyst dx'd 04/22/2016   left   Palpitations    Pneumonia 03/2016   [name] 04/24/2016   Renal transplant failure and rejection    age 20-11   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   Self-catheterizes urinary bladder    "~ 12 X/day" (04/24/2016)   SOB (shortness of breath)    Stroke (HCC) 1991   denies residual on 04/24/2016   Vitamin D deficiency     PAST SURGICAL HISTORY: Past Surgical History:  Procedure Laterality Date    ABDOMINAL HERNIA REPAIR  "several; when I was little"   allograph biopsy  2013   /notes 04/24/2016   HERNIA REPAIR     KIDNEY TRANSPLANT  1996; 1998   father was donor; mother was donor/notes 04/24/2016   PACEMAKER  INSERTION  "?early 2000s"   for bladder function   PARTIAL NEPHRECTOMY  1990s X 5   "removing disease"   PORTACATH PLACEMENT Right 11/2015   RIGHT OOPHORECTOMY Right 2001   ovarian torsion    SOCIAL HISTORY: Social History  Substance Use Topics   Smoking status: Former Smoker    Packs/day: 0.50    Years: 4.00    Types: Cigarettes    Quit date: 07/09/2014   Smokeless tobacco: Never Used   Alcohol use Yes     Comment: 04/24/2016 "might have 2 drinks/month, if that"    FAMILY HISTORY: Family History  Problem Relation Age of Onset   Hypertension Mother    CAD Father        s/p CABG   Hyperlipidemia Father    Heart disease Father    Thyroid disease Father     ROS: Review of Systems  Constitutional: Positive for weight loss.  Psychiatric/Behavioral: Positive for depression. Negative for suicidal ideas.    PHYSICAL EXAM: Blood pressure 97/60, pulse 75, temperature 98.7 F (37.1 C), temperature source Oral, height 5\' 2"  (1.575 m), weight 159 lb (72.1 kg), SpO2 97 %. Body mass index is 29.08 kg/m. Physical Exam  Constitutional: She is oriented to person, place, and time. She appears well-developed and well-nourished.  Cardiovascular: Normal rate.   Pulmonary/Chest: Effort normal.  Musculoskeletal: Normal range of motion.  Neurological: She is oriented to person, place, and time.  Skin: Skin is warm and dry.  Vitals reviewed.   RECENT LABS AND TESTS: BMET    Component Value Date/Time   NA 142 08/22/2016 1440   K 3.9 08/22/2016 1440   CL 102 08/22/2016 1440   CO2 23 08/22/2016 1440   GLUCOSE 102 (H) 08/22/2016 1440   GLUCOSE 107 (H) 07/15/2016 0302   BUN 39 (H) 08/22/2016 1440   CREATININE 2.37 (H) 08/22/2016 1440   CALCIUM 9.5  08/22/2016 1440   GFRNONAA 26 (L) 08/22/2016 1440   GFRAA 30 (L) 08/22/2016 1440   Lab Results  Component Value Date   HGBA1C 5.1 08/22/2016   Lab Results  Component Value Date   INSULIN 42.5 (H) 08/22/2016   CBC    Component Value Date/Time   WBC 5.8 08/22/2016 1440   WBC 5.2 07/15/2016 0302   RBC 3.60 (L) 08/22/2016 1440   RBC 3.92 07/15/2016 0302   HGB 10.7 (L) 08/22/2016 1440   HCT 33.0 (L) 08/22/2016 1440   PLT 203 07/15/2016 0302   PLT 228 06/21/2016 1623   MCV 92 08/22/2016 1440   MCH 29.7 08/22/2016 1440   MCH 29.8 07/15/2016 0302   MCHC 32.4 08/22/2016 1440   MCHC 32.6 07/15/2016 0302   RDW 14.5 08/22/2016 1440   LYMPHSABS 1.9 08/22/2016 1440   MONOABS 0.4 07/11/2016 1846   EOSABS 0.1 08/22/2016 1440   BASOSABS 0.0 08/22/2016 1440   Iron/TIBC/Ferritin/ %Sat No results found for: IRON, TIBC, FERRITIN, IRONPCTSAT Lipid Panel  No results found for: CHOL, TRIG, HDL, CHOLHDL, VLDL, LDLCALC, LDLDIRECT Hepatic Function Panel     Component Value Date/Time   PROT 7.2 08/22/2016 1440   ALBUMIN 4.5 08/22/2016 1440   AST 37 08/22/2016 1440   ALT 24 08/22/2016 1440   ALKPHOS 41 08/22/2016 1440   BILITOT 0.5 08/22/2016 1440      Component Value Date/Time   TSH 0.019 (L) 08/22/2016 1440   TSH 3.890 04/25/2016 0157    ASSESSMENT AND PLAN: Depression with anxiety  Class 1 obesity without serious comorbidity with  body mass index (BMI) of 30.0 to 30.9 in adult, unspecified obesity type  PLAN:  Depression with Emotional Eating Behaviors We discussed behavior modification techniques today to help Rutha deal with her emotional eating and depression. She agrees to cognitive behavioral therapy to help identify and avoid emotional eating. She has agreed to continue medications as prescribed and will follow up as directed to evaluate mood.  We spent > than 50% of the 15 minute visit on the counseling as documented in the note.  Obesity Jonetta is currently in  the action stage of change. As such, her goal is to continue with weight loss efforts She has agreed to keep a food journal with 1200 calories and 70+ grams of protein  Klohe has been instructed to work up to a goal of 150 minutes of combined cardio and strengthening exercise per week or start walking 10 to 15 minutes per day for weight loss and overall health benefits. We discussed the following Behavioral Modification Strategies today: increasing lean protein intake  And increase H2O intake Geana is at risk of losing too much weight. She will continue to watch for signs of exercise restriction of food and obsessive behavior.  Lylie has agreed to follow up with our clinic in 2 to 3 weeks. She was informed of the importance of frequent follow up visits to maximize her success with intensive lifestyle modifications for her multiple health conditions.  I, [place] Crane, am acting as transcriptionist for Quillian Quince, MD  I have reviewed the above documentation for accuracy and completeness, and I agree with the above. -Quillian Quince, MD  OBESITY BEHAVIORAL INTERVENTION VISIT  Today's visit was # 9 out of 22.  Starting weight: 193 lbs Starting date: 08/22/16 Today's weight : 159 lbs Today's date: 01/01/2017 Total lbs lost to date: 54 (Patients must lose 7 lbs in the first 6 months to continue with counseling)   ASK: We discussed the diagnosis of obesity with Felipa Evener today and Admire agreed to give [place] permission to discuss obesity behavioral modification therapy today.  ASSESS: Donna Shannon has the diagnosis of obesity and her BMI today is 29.1 Donna Shannon is in the action stage of change   ADVISE: Donna Shannon was educated on the multiple health risks of obesity as well as the benefit of weight loss to improve her health. She was advised of the need for long term treatment and the importance of lifestyle modifications.  AGREE: Multiple dietary modification options and  treatment options were discussed and  Donna Shannon agreed to keep a food journal with 1200 calories and 70+ grams of protein  daily We discussed the following Behavioral Modification Strategies today: increasing lean protein intake and H2O intake

## 2017-01-03 ENCOUNTER — Telehealth: Payer: Self-pay

## 2017-01-03 NOTE — Telephone Encounter (Signed)
Received OV notes from Dr. Kathrene BongoGoldsborough stating, "She seems to be on a lot of meds for comfort. She was not willing to decrease the Klonopin, She ws willing to decrease the Neurontin and Ultram, we will continue to try to decrease these meds slowly. Follow-up visit in 3 months." Sent to med records for scanning, copy to Dr. Lucia GaskinsAhern for review.

## 2017-01-10 ENCOUNTER — Other Ambulatory Visit (INDEPENDENT_AMBULATORY_CARE_PROVIDER_SITE_OTHER): Payer: Self-pay

## 2017-01-10 DIAGNOSIS — F3289 Other specified depressive episodes: Secondary | ICD-10-CM

## 2017-01-10 MED ORDER — ESCITALOPRAM OXALATE 20 MG PO TABS: 20.0000 mg | ORAL_TABLET | Freq: Every day | ORAL | 0 refills | Status: DC

## 2017-01-22 ENCOUNTER — Ambulatory Visit (INDEPENDENT_AMBULATORY_CARE_PROVIDER_SITE_OTHER): Payer: Medicare Other | Admitting: Family Medicine

## 2017-01-22 VITALS — BP 103/69 | HR 92 | Temp 98.2°F | Ht 62.0 in | Wt 161.0 lb

## 2017-01-22 DIAGNOSIS — Z6830 Body mass index (BMI) 30.0-30.9, adult: Secondary | ICD-10-CM

## 2017-01-22 DIAGNOSIS — E669 Obesity, unspecified: Secondary | ICD-10-CM | POA: Diagnosis not present

## 2017-01-22 DIAGNOSIS — K219 Gastro-esophageal reflux disease without esophagitis: Secondary | ICD-10-CM | POA: Insufficient documentation

## 2017-01-22 DIAGNOSIS — F988 Other specified behavioral and emotional disorders with onset usually occurring in childhood and adolescence: Secondary | ICD-10-CM | POA: Insufficient documentation

## 2017-01-22 DIAGNOSIS — F3289 Other specified depressive episodes: Secondary | ICD-10-CM

## 2017-01-22 DIAGNOSIS — E559 Vitamin D deficiency, unspecified: Secondary | ICD-10-CM

## 2017-01-22 MED ORDER — ESCITALOPRAM OXALATE 20 MG PO TABS: 20.0000 mg | ORAL_TABLET | Freq: Every day | ORAL | 0 refills | Status: DC

## 2017-01-22 MED ORDER — VITAMIN D-3 25 MCG (1000 UT) PO CAPS: 1.0000 | ORAL_CAPSULE | Freq: Every day | ORAL | 0 refills | Status: DC

## 2017-01-22 NOTE — Progress Notes (Signed)
Office: [phone]  /  Fax: [phone]   HPI:   Chief Complaint: OBESITY Donna Shannon is here to discuss her progress with her obesity treatment plan. She is on the  keep a food journal with 1200 calories and 70+ grams of protein  and is following her eating plan approximately 70 % of the time. She states she is walking for 20 minutes 5 times per week. Donna Shannon was on vacation and had trouble following the plan. She is motivated to get back on track. Her weight is 161 lb (73 kg) today and has had a weight gain of 2 pounds over a period of 3 weeks since her last visit. She has lost 32 lbs since starting treatment with Donna Shannon.  Vitamin D deficiency Donna Shannon has a diagnosis of vitamin D deficiency. She is currently taking vit D and denies nausea, vomiting or muscle weakness.  Depression with Anxiety Donna Shannon mood is stable. She is struggles with emotional eating and using food for comfort to the extent that it is negatively impacting her health. She often snacks when she is not hungry. Donna Shannon sometimes feels she is out of control and then feels guilty that she made poor food choices. She has been working on behavior modification techniques to help reduce her emotional eating and has been somewhat successful. She shows no sign of suicidal or homicidal ideations.  Depression screen PHQ 2/9 08/22/2016  Decreased Interest 2  Down, Depressed, Hopeless 1  PHQ - 2 Score 3  Altered sleeping 1  Tired, decreased energy 2  Change in appetite 1  Feeling bad or failure about yourself  0  Trouble concentrating 1  Moving slowly or fidgety/restless 1  Suicidal thoughts 0  PHQ-9 Score 9      ALLERGIES: Allergies  Allergen Reactions   Sulfa Antibiotics Itching    MEDICATIONS: Current Outpatient Prescriptions on File Prior to Visit  Medication Sig Dispense Refill   acetaminophen-codeine (TYLENOL #4) 300-60 MG tablet Take 1 tablet by mouth every 6 (six) hours as needed for pain.      aspirin 81 MG chewable tablet Chew 81 mg by mouth daily.     ciprofloxacin (CIPRO) 500 MG tablet Take 500 mg by mouth 2 (two) times daily.     clonazePAM (KLONOPIN) 0.5 MG tablet Take 0.5 mg by mouth See admin instructions. Take 2 tablets in the morning, then take one in the afternoon, then take 2 tablets at night per patient     cycloSPORINE (SANDIMMUNE) 100 MG capsule Take 100 mg by mouth 2 (two) times daily.     cycloSPORINE (SANDIMMUNE) 25 MG capsule Take 25 mg by mouth every morning.     escitalopram (LEXAPRO) 20 MG tablet Take 1 tablet (20 mg total) by mouth daily. 30 tablet 0   fenofibrate 160 MG tablet Take 160 mg by mouth daily.     flavoxATE (URISPAS) 100 MG tablet Take 100 mg by mouth 3 (three) times daily. Take every day per patient     gabapentin (NEURONTIN) 300 MG capsule Take 300 mg by mouth daily.      magnesium oxide (MAG-OX) 400 MG tablet Take 400 mg by mouth daily.      methylphenidate (RITALIN) 10 MG tablet Take 10 mg by mouth 2 (two) times daily.     metoprolol (LOPRESSOR) 50 MG tablet Take 1 tablet (50 mg total) by mouth 2 (two) times daily. (Patient taking differently: Take 50 mg by mouth daily. ) 60 tablet 1   mirabegron ER (MYRBETRIQ) 50  MG TB24 tablet Take 50 mg by mouth at bedtime.     mycophenolate (CELLCEPT) 500 MG tablet Take 500 mg by mouth 2 (two) times daily.     omega-3 acid ethyl esters (LOVAZA) 1 g capsule Take 1 g by mouth 3 (three) times daily.     ondansetron (ZOFRAN) 8 MG tablet Take 0.5 tablets (4 mg total) by mouth every 8 (eight) hours as needed for nausea or vomiting. 20 tablet 0   pravastatin (PRAVACHOL) 40 MG tablet Take 40 mg by mouth at bedtime.     predniSONE (DELTASONE) 2.5 MG tablet Take 2.5 mg by mouth daily with breakfast.     rizatriptan (MAXALT) 10 MG tablet Take 1 tablet (10 mg total) by mouth daily as needed for migraine. May repeat in 2 hours if needed 10 tablet 6   sertraline (ZOLOFT) 100 MG tablet Take 100 mg by mouth 2  (two) times daily. 1/2 pill in the AM, one pill in the PM     traMADol (ULTRAM) 50 MG tablet Take 50 mg by mouth 2 (two) times daily.      acetaZOLAMIDE (DIAMOX) 250 MG tablet Take 2 tablets (500 mg total) by mouth 2 (two) times daily. (Patient taking differently: Take 500 mg by mouth daily. ) 360 tablet 3   furosemide (LASIX) 40 MG tablet Take 1 tablet (40 mg total) by mouth 2 (two) times daily. (Patient taking differently: Take 20 mg by mouth daily. ) 30 tablet 1   No current facility-administered medications on file prior to visit.     PAST MEDICAL HISTORY: Past Medical History:  Diagnosis Date   Anemia    Anxiety    Arthritis    "left knee" (04/24/2016)   Back pain    Chronic lower back pain    Constipation    Depression    Edema    Essential hypertension    Factor V Leiden (HCC)    Donna Shannon 04/24/2016   Frequent UTI    /notes 04/24/2016   GERD (gastroesophageal reflux disease)    Gout    High cholesterol    HTN (hypertension)    Kidney disease    Migraine    "weekly" (04/24/2016)   Neurogenic bladder    augmented bladder, I/O self caths   On home oxygen therapy    "2L; every night" (04/24/2016)   Osteoporosis    Ovarian cyst dx'd 04/22/2016   left   Palpitations    Pneumonia 03/2016   Donna Shannon 04/24/2016   Renal transplant failure and rejection    age 70-9   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   Self-catheterizes urinary bladder    "~ 12 X/day" (04/24/2016)   SOB (shortness of breath)    Stroke (HCC) 1991   denies residual on 04/24/2016   Vitamin D deficiency     PAST SURGICAL HISTORY: Past Surgical History:  Procedure Laterality Date   ABDOMINAL HERNIA REPAIR  "several; when I was little"   allograph biopsy  2013   /notes 04/24/2016   HERNIA REPAIR     KIDNEY TRANSPLANT  1996; 1998   father was donor; mother was donor/notes 04/24/2016   PACEMAKER INSERTION   "?early 2000s"   for bladder function   PARTIAL NEPHRECTOMY  1990s X 5   "removing disease"   PORTACATH PLACEMENT Right 11/2015   RIGHT OOPHORECTOMY Right 2001   ovarian torsion    SOCIAL HISTORY: Social History  Substance  Use Topics   Smoking status: Former Smoker    Packs/day: 0.50    Years: 4.00    Types: Cigarettes    Quit date: 07/09/2014   Smokeless tobacco: Never Used   Alcohol use Yes     Comment: 04/24/2016 "might have 2 drinks/month, if that"    FAMILY HISTORY: Family History  Problem Relation Age of Onset   Hypertension Mother    CAD Father        s/p CABG   Hyperlipidemia Father    Heart disease Father    Thyroid disease Father     ROS: Review of Systems  Constitutional: Negative for weight loss.  Gastrointestinal: Negative for nausea and vomiting.  Musculoskeletal:       Negative muscle weakness  Psychiatric/Behavioral: Positive for depression. Negative for suicidal ideas. The patient is nervous/anxious (anxiety).     PHYSICAL EXAM: Blood pressure 103/69, pulse 92, temperature 98.2 F (36.8 C), temperature source Oral, height 5\' 2"  (1.575 m), weight 161 lb (73 kg), SpO2 98 %. Body mass index is 29.45 kg/m. Physical Exam  Constitutional: She is oriented to person, place, and time. She appears well-developed and well-nourished.  Cardiovascular: Normal rate.   Pulmonary/Chest: Effort normal.  Abdominal: Soft.  Musculoskeletal: Normal range of motion.  Neurological: She is oriented to person, place, and time.  Skin: Skin is warm and dry.  Psychiatric: She has a normal mood and affect. Her behavior is normal.  Vitals reviewed.   RECENT LABS AND TESTS: BMET    Component Value Date/Time   NA 142 08/22/2016 1440   K 3.9 08/22/2016 1440   CL 102 08/22/2016 1440   CO2 23 08/22/2016 1440   GLUCOSE 102 (H) 08/22/2016 1440   GLUCOSE 107 (H) 07/15/2016 0302   BUN 39 (H) 08/22/2016 1440   CREATININE 2.37 (H) 08/22/2016 1440   CALCIUM 9.5  08/22/2016 1440   GFRNONAA 26 (L) 08/22/2016 1440   GFRAA 30 (L) 08/22/2016 1440   Lab Results  Component Value Date   HGBA1C 5.1 08/22/2016   Lab Results  Component Value Date   INSULIN 42.5 (H) 08/22/2016   CBC    Component Value Date/Time   WBC 5.8 08/22/2016 1440   WBC 5.2 07/15/2016 0302   RBC 3.60 (L) 08/22/2016 1440   RBC 3.92 07/15/2016 0302   HGB 10.7 (L) 08/22/2016 1440   HCT 33.0 (L) 08/22/2016 1440   PLT 203 07/15/2016 0302   PLT 228 06/21/2016 1623   MCV 92 08/22/2016 1440   MCH 29.7 08/22/2016 1440   MCH 29.8 07/15/2016 0302   MCHC 32.4 08/22/2016 1440   MCHC 32.6 07/15/2016 0302   RDW 14.5 08/22/2016 1440   LYMPHSABS 1.9 08/22/2016 1440   MONOABS 0.4 07/11/2016 1846   EOSABS 0.1 08/22/2016 1440   BASOSABS 0.0 08/22/2016 1440   Iron/TIBC/Ferritin/ %Sat No results found for: IRON, TIBC, FERRITIN, IRONPCTSAT Lipid Panel  No results found for: CHOL, TRIG, HDL, CHOLHDL, VLDL, LDLCALC, LDLDIRECT Hepatic Function Panel     Component Value Date/Time   PROT 7.2 08/22/2016 1440   ALBUMIN 4.5 08/22/2016 1440   AST 37 08/22/2016 1440   ALT 24 08/22/2016 1440   ALKPHOS 41 08/22/2016 1440   BILITOT 0.5 08/22/2016 1440      Component Value Date/Time   TSH 0.019 (L) 08/22/2016 1440   TSH 3.890 04/25/2016 0157    ASSESSMENT AND PLAN: Vitamin D deficiency - Plan: Cholecalciferol (VITAMIN D-3) 1000 units CAPS  Other depression - Plan: escitalopram (  LEXAPRO) 20 MG tablet  Class 1 obesity with serious comorbidity and body mass index (BMI) of 30.0 to 30.9 in adult, unspecified obesity type - Pt started program with BMI over 30.    PLAN:  Vitamin D Deficiency Donna Shannon was informed that low vitamin D levels contributes to fatigue and are associated with obesity, breast, and colon cancer. She agrees to continue to take prescription Vit D @50 ,000 IU every week and will follow up for routine testing of vitamin D, at least 2-3 times per year. She was informed of  the risk of over-replacement of vitamin D and agrees to not increase her dose unless he discusses this with [place] first.  Depression with Anxiety We discussed behavior modification techniques today to help Donna Shannon deal with her depression and anxiety. She has agreed to continue to take Lexapro 20 mg qd #30 with no refills and will follow up as directed.  Obesity Donna Shannon is currently in the action stage of change. As such, her goal is to continue with weight loss efforts She has agreed to keep a food journal with 1200 calories and 70+ grams of protein  Donna Shannon has been instructed to work up to a goal of 150 minutes of combined cardio and strengthening exercise per week for weight loss and overall health benefits. We discussed the following Behavioral Modification Strategies today: increasing lean protein intake and meal planning & cooking strategies  Donna Shannon has agreed to follow up with our clinic in 3 weeks. She was informed of the importance of frequent follow up visits to maximize her success with intensive lifestyle modifications for her multiple health conditions.  I, Nevada Crane, am acting as transcriptionist for Donna Quince, MD  I have reviewed the above documentation for accuracy and completeness, and I agree with the above. -Donna Quince, MD    OBESITY BEHAVIORAL INTERVENTION VISIT  Today's visit was # 10 out of 22.  Starting weight: 193 lbs Starting date: 08/22/16 Today's weight : 161 lbs  Today's date: 01/22/2017 Total lbs lost to date: 50 (Patients must lose 7 lbs in the first 6 months to continue with counseling)   ASK: We discussed the diagnosis of obesity with Felipa Evener today and Nicolet agreed to give [place] permission to discuss obesity behavioral modification therapy today.  ASSESS: Belladonna has the diagnosis of obesity and her BMI today is 29.5 Annisha is in the action stage of change   ADVISE: Lisamarie was educated on the multiple health risks  of obesity as well as the benefit of weight loss to improve her health. She was advised of the need for long term treatment and the importance of lifestyle modifications.  AGREE: Multiple dietary modification options and treatment options were discussed and  Lamija agreed to keep a food journal with 1200 calories and 70+ grams of protein  We discussed the following Behavioral Modification Strategies today: increasing lean protein intake and meal planning & cooking strategies

## 2017-01-25 ENCOUNTER — Other Ambulatory Visit: Payer: Self-pay | Admitting: *Deleted

## 2017-01-25 MED ORDER — RIZATRIPTAN BENZOATE 10 MG PO TABS: 10.0000 mg | ORAL_TABLET | Freq: Every day | ORAL | 6 refills | Status: DC | PRN

## 2017-01-25 NOTE — Telephone Encounter (Signed)
Refilled maxalt. Phenergan supp, was not on med list, although has been prescribed previously. Will consult on Monday with Dr, Lucia GaskinsAhern

## 2017-01-28 ENCOUNTER — Other Ambulatory Visit: Payer: Self-pay | Admitting: Neurology

## 2017-01-30 ENCOUNTER — Telehealth (INDEPENDENT_AMBULATORY_CARE_PROVIDER_SITE_OTHER): Payer: Self-pay | Admitting: Family Medicine

## 2017-01-30 ENCOUNTER — Other Ambulatory Visit: Payer: Self-pay | Admitting: Neurology

## 2017-01-30 NOTE — Telephone Encounter (Signed)
Donna Shannon with pharmacy [facility] wants to know if Dr Dalbert GarnetBeasley is aware that sertraline is a medication that pt has been taking.   [phone]

## 2017-01-30 NOTE — Telephone Encounter (Signed)
Spoke to Dr Dalbert GarnetBeasley,  Yes pt is to be taking both lexapro and sertraline for the time being.  [facility] of above and gave verbal understanding.  [facility] asked [facility] to transfer rx of [facility]ap[facility] from [facility] yesterday.  Tried to call pt,  no answer and no voice mail to leave message.  We will try later.   Kiley Solimine R CMA

## 2017-01-31 ENCOUNTER — Encounter (INDEPENDENT_AMBULATORY_CARE_PROVIDER_SITE_OTHER): Payer: Self-pay | Admitting: Physician Assistant

## 2017-02-12 ENCOUNTER — Ambulatory Visit (INDEPENDENT_AMBULATORY_CARE_PROVIDER_SITE_OTHER): Payer: Medicare Other | Admitting: Physician Assistant

## 2017-02-12 ENCOUNTER — Other Ambulatory Visit: Payer: Self-pay | Admitting: Neurology

## 2017-02-21 ENCOUNTER — Encounter (INDEPENDENT_AMBULATORY_CARE_PROVIDER_SITE_OTHER): Payer: Self-pay

## 2017-02-21 ENCOUNTER — Ambulatory Visit (INDEPENDENT_AMBULATORY_CARE_PROVIDER_SITE_OTHER): Payer: Medicare Other | Admitting: Physician Assistant

## 2017-02-21 DIAGNOSIS — L24A9 Irritant contact dermatitis due friction or contact with other specified body fluids: Secondary | ICD-10-CM | POA: Insufficient documentation

## 2017-02-21 DIAGNOSIS — T148XXA Other injury of unspecified body region, initial encounter: Secondary | ICD-10-CM | POA: Insufficient documentation

## 2017-02-22 ENCOUNTER — Inpatient Hospital Stay (HOSPITAL_COMMUNITY): Payer: Medicare Other

## 2017-02-22 ENCOUNTER — Telehealth: Payer: Self-pay | Admitting: Neurology

## 2017-02-22 ENCOUNTER — Inpatient Hospital Stay (HOSPITAL_COMMUNITY)
Admission: EM | Admit: 2017-02-22 | Discharge: 2017-02-23 | DRG: 871 | Disposition: A | Discharge status: Other Acute Inpatient Hospital | Payer: Medicare Other | Attending: Internal Medicine | Admitting: Internal Medicine

## 2017-02-22 ENCOUNTER — Emergency Department (HOSPITAL_COMMUNITY): Payer: Medicare Other

## 2017-02-22 DIAGNOSIS — G9341 Metabolic encephalopathy: Secondary | ICD-10-CM | POA: Diagnosis present

## 2017-02-22 DIAGNOSIS — J80 Acute respiratory distress syndrome: Secondary | ICD-10-CM | POA: Diagnosis present

## 2017-02-22 DIAGNOSIS — I959 Hypotension, unspecified: Secondary | ICD-10-CM | POA: Diagnosis present

## 2017-02-22 DIAGNOSIS — Y849 Medical procedure, unspecified as the cause of abnormal reaction of the patient, or of later complication, without mention of misadventure at the time of the procedure: Secondary | ICD-10-CM | POA: Diagnosis present

## 2017-02-22 DIAGNOSIS — N135 Crossing vessel and stricture of ureter without hydronephrosis: Secondary | ICD-10-CM | POA: Diagnosis present

## 2017-02-22 DIAGNOSIS — M810 Age-related osteoporosis without current pathological fracture: Secondary | ICD-10-CM | POA: Diagnosis present

## 2017-02-22 DIAGNOSIS — T8619 Other complication of kidney transplant: Secondary | ICD-10-CM | POA: Diagnosis present

## 2017-02-22 DIAGNOSIS — G253 Myoclonus: Secondary | ICD-10-CM | POA: Diagnosis present

## 2017-02-22 DIAGNOSIS — Z9981 Dependence on supplemental oxygen: Secondary | ICD-10-CM

## 2017-02-22 DIAGNOSIS — Z8249 Family history of ischemic heart disease and other diseases of the circulatory system: Secondary | ICD-10-CM

## 2017-02-22 DIAGNOSIS — J9602 Acute respiratory failure with hypercapnia: Secondary | ICD-10-CM

## 2017-02-22 DIAGNOSIS — Z882 Allergy status to sulfonamides status: Secondary | ICD-10-CM

## 2017-02-22 DIAGNOSIS — N170 Acute kidney failure with tubular necrosis: Secondary | ICD-10-CM | POA: Diagnosis present

## 2017-02-22 DIAGNOSIS — Y95 Nosocomial condition: Secondary | ICD-10-CM | POA: Diagnosis present

## 2017-02-22 DIAGNOSIS — R9401 Abnormal electroencephalogram [EEG]: Secondary | ICD-10-CM | POA: Diagnosis not present

## 2017-02-22 DIAGNOSIS — K219 Gastro-esophageal reflux disease without esophagitis: Secondary | ICD-10-CM | POA: Diagnosis present

## 2017-02-22 DIAGNOSIS — M1712 Unilateral primary osteoarthritis, left knee: Secondary | ICD-10-CM | POA: Diagnosis present

## 2017-02-22 DIAGNOSIS — E559 Vitamin D deficiency, unspecified: Secondary | ICD-10-CM | POA: Diagnosis present

## 2017-02-22 DIAGNOSIS — Z7982 Long term (current) use of aspirin: Secondary | ICD-10-CM | POA: Diagnosis not present

## 2017-02-22 DIAGNOSIS — R6521 Severe sepsis with septic shock: Secondary | ICD-10-CM | POA: Diagnosis present

## 2017-02-22 DIAGNOSIS — R569 Unspecified convulsions: Secondary | ICD-10-CM | POA: Diagnosis present

## 2017-02-22 DIAGNOSIS — J189 Pneumonia, unspecified organism: Secondary | ICD-10-CM | POA: Diagnosis present

## 2017-02-22 DIAGNOSIS — N179 Acute kidney failure, unspecified: Secondary | ICD-10-CM

## 2017-02-22 DIAGNOSIS — R400 Somnolence: Secondary | ICD-10-CM

## 2017-02-22 DIAGNOSIS — D6851 Activated protein C resistance: Secondary | ICD-10-CM | POA: Diagnosis present

## 2017-02-22 DIAGNOSIS — G43909 Migraine, unspecified, not intractable, without status migrainosus: Secondary | ICD-10-CM | POA: Diagnosis present

## 2017-02-22 DIAGNOSIS — Z8673 Personal history of transient ischemic attack (TIA), and cerebral infarction without residual deficits: Secondary | ICD-10-CM

## 2017-02-22 DIAGNOSIS — E871 Hypo-osmolality and hyponatremia: Secondary | ICD-10-CM | POA: Diagnosis present

## 2017-02-22 DIAGNOSIS — K567 Ileus, unspecified: Secondary | ICD-10-CM | POA: Diagnosis present

## 2017-02-22 DIAGNOSIS — E872 Acidosis: Secondary | ICD-10-CM | POA: Diagnosis present

## 2017-02-22 DIAGNOSIS — N319 Neuromuscular dysfunction of bladder, unspecified: Secondary | ICD-10-CM | POA: Diagnosis present

## 2017-02-22 DIAGNOSIS — A419 Sepsis, unspecified organism: Secondary | ICD-10-CM | POA: Diagnosis present

## 2017-02-22 DIAGNOSIS — E875 Hyperkalemia: Secondary | ICD-10-CM | POA: Diagnosis present

## 2017-02-22 DIAGNOSIS — J9601 Acute respiratory failure with hypoxia: Secondary | ICD-10-CM

## 2017-02-22 DIAGNOSIS — I129 Hypertensive chronic kidney disease with stage 1 through stage 4 chronic kidney disease, or unspecified chronic kidney disease: Secondary | ICD-10-CM | POA: Diagnosis present

## 2017-02-22 DIAGNOSIS — N12 Tubulo-interstitial nephritis, not specified as acute or chronic: Secondary | ICD-10-CM | POA: Diagnosis present

## 2017-02-22 DIAGNOSIS — Z90721 Acquired absence of ovaries, unilateral: Secondary | ICD-10-CM

## 2017-02-22 DIAGNOSIS — Z950 Presence of cardiac pacemaker: Secondary | ICD-10-CM

## 2017-02-22 DIAGNOSIS — Z87440 Personal history of urinary (tract) infections: Secondary | ICD-10-CM

## 2017-02-22 DIAGNOSIS — Z87891 Personal history of nicotine dependence: Secondary | ICD-10-CM

## 2017-02-22 DIAGNOSIS — N184 Chronic kidney disease, stage 4 (severe): Secondary | ICD-10-CM | POA: Diagnosis present

## 2017-02-22 DIAGNOSIS — T8189XA Other complications of procedures, not elsewhere classified, initial encounter: Secondary | ICD-10-CM | POA: Diagnosis present

## 2017-02-22 DIAGNOSIS — E889 Metabolic disorder, unspecified: Secondary | ICD-10-CM | POA: Diagnosis not present

## 2017-02-22 DIAGNOSIS — Z7952 Long term (current) use of systemic steroids: Secondary | ICD-10-CM

## 2017-02-22 DIAGNOSIS — N7092 Oophoritis, unspecified: Secondary | ICD-10-CM

## 2017-02-22 DIAGNOSIS — D62 Acute posthemorrhagic anemia: Secondary | ICD-10-CM | POA: Diagnosis present

## 2017-02-22 DIAGNOSIS — E785 Hyperlipidemia, unspecified: Secondary | ICD-10-CM | POA: Diagnosis present

## 2017-02-22 DIAGNOSIS — T8612 Kidney transplant failure: Secondary | ICD-10-CM | POA: Diagnosis present

## 2017-02-22 DIAGNOSIS — E7800 Pure hypercholesterolemia, unspecified: Secondary | ICD-10-CM | POA: Diagnosis present

## 2017-02-22 DIAGNOSIS — Z8349 Family history of other endocrine, nutritional and metabolic diseases: Secondary | ICD-10-CM

## 2017-02-22 DIAGNOSIS — Z79899 Other long term (current) drug therapy: Secondary | ICD-10-CM

## 2017-02-22 DIAGNOSIS — G9389 Other specified disorders of brain: Secondary | ICD-10-CM | POA: Diagnosis not present

## 2017-02-22 HISTORY — DX: Unspecified convulsions: R56.9

## 2017-02-22 LAB — COMPREHENSIVE METABOLIC PANEL
ALBUMIN: 2.4 g/dL — AB (ref 3.5–5.0)
ALK PHOS: 94 U/L (ref 38–126)
ALT: 13 U/L — ABNORMAL LOW (ref 14–54)
AST: 32 U/L (ref 15–41)
Anion gap: 13 (ref 5–15)
BILIRUBIN TOTAL: 1.3 mg/dL — AB (ref 0.3–1.2)
BUN: 62 mg/dL — AB (ref 6–20)
CALCIUM: 9.2 mg/dL (ref 8.9–10.3)
CO2: 23 mmol/L (ref 22–32)
Chloride: 97 mmol/L — ABNORMAL LOW (ref 101–111)
Creatinine, Ser: 4.61 mg/dL — ABNORMAL HIGH (ref 0.44–1.00)
GFR calc Af Amer: 13 mL/min — ABNORMAL LOW (ref >60)
GFR calc non Af Amer: 12 mL/min — ABNORMAL LOW (ref >60)
Glucose, Bld: 93 mg/dL (ref 65–99)
Potassium: 5.8 mmol/L — ABNORMAL HIGH (ref 3.5–5.1)
Sodium: 133 mmol/L — ABNORMAL LOW (ref 135–145)
Total Protein: 6.4 g/dL — ABNORMAL LOW (ref 6.5–8.1)

## 2017-02-22 LAB — POCT I-STAT 3, ART BLOOD GAS (G3+)
ACID-BASE DEFICIT: 6 mmol/L — AB (ref 0.0–2.0)
Bicarbonate: 19.8 mmol/L — ABNORMAL LOW (ref 20.0–28.0)
O2 SAT: 99 %
PO2 ART: 161 mmHg — AB (ref 83.0–108.0)
TCO2: 21 mmol/L (ref 0–100)
pCO2 arterial: 41.6 mmHg (ref 32.0–48.0)
pH, Arterial: 7.286 — ABNORMAL LOW (ref 7.350–7.450)

## 2017-02-22 LAB — URINALYSIS, ROUTINE W REFLEX MICROSCOPIC
BILIRUBIN URINE: NEGATIVE
Glucose, UA: NEGATIVE mg/dL
Hgb urine dipstick: NEGATIVE
Ketones, ur: NEGATIVE mg/dL
Leukocytes, UA: NEGATIVE
Nitrite: NEGATIVE
Protein, ur: 30 mg/dL — AB
Specific Gravity, Urine: 1.018 (ref 1.005–1.030)
pH: 5 (ref 5.0–8.0)

## 2017-02-22 LAB — CBC WITH DIFFERENTIAL/PLATELET
Basophils Absolute: 0 10*3/uL (ref 0.0–0.1)
Basophils Relative: 0 %
EOS PCT: 1 %
Eosinophils Absolute: 0.1 10*3/uL (ref 0.0–0.7)
HEMATOCRIT: 24.9 % — AB (ref 36.0–46.0)
Hemoglobin: 7.3 g/dL — ABNORMAL LOW (ref 12.0–15.0)
Lymphocytes Relative: 9 %
Lymphs Abs: 0.9 10*3/uL (ref 0.7–4.0)
MCH: 27.8 pg (ref 26.0–34.0)
MCHC: 29.3 g/dL — ABNORMAL LOW (ref 30.0–36.0)
MCV: 94.7 fL (ref 78.0–100.0)
MONO ABS: 0.8 10*3/uL (ref 0.1–1.0)
Monocytes Relative: 7 %
NEUTROS ABS: 8.8 10*3/uL — AB (ref 1.7–7.7)
Neutrophils Relative %: 83 %
PLATELETS: 257 10*3/uL (ref 150–400)
RBC: 2.63 MIL/uL — ABNORMAL LOW (ref 3.87–5.11)
RDW: 15.1 % (ref 11.5–15.5)
WBC: 10.6 10*3/uL — ABNORMAL HIGH (ref 4.0–10.5)

## 2017-02-22 LAB — BASIC METABOLIC PANEL
Anion gap: 12 (ref 5–15)
BUN: 58 mg/dL — ABNORMAL HIGH (ref 6–20)
CHLORIDE: 104 mmol/L (ref 101–111)
CO2: 17 mmol/L — AB (ref 22–32)
CREATININE: 4.17 mg/dL — AB (ref 0.44–1.00)
Calcium: 8.1 mg/dL — ABNORMAL LOW (ref 8.9–10.3)
GFR calc Af Amer: 15 mL/min — ABNORMAL LOW (ref >60)
GFR calc non Af Amer: 13 mL/min — ABNORMAL LOW (ref >60)
Glucose, Bld: 108 mg/dL — ABNORMAL HIGH (ref 65–99)
Potassium: 6 mmol/L — ABNORMAL HIGH (ref 3.5–5.1)
Sodium: 133 mmol/L — ABNORMAL LOW (ref 135–145)

## 2017-02-22 LAB — I-STAT CG4 LACTIC ACID, ED
Lactic Acid, Venous: 0.85 mmol/L (ref 0.5–1.9)
Lactic Acid, Venous: 0.59 mmol/L (ref 0.5–1.9)

## 2017-02-22 LAB — I-STAT ARTERIAL BLOOD GAS, ED
Acid-base deficit: 9 mmol/L — ABNORMAL HIGH (ref 0.0–2.0)
BICARBONATE: 22.4 mmol/L (ref 20.0–28.0)
O2 Saturation: 100 %
PO2 ART: 267 mmHg — AB (ref 83.0–108.0)
Patient temperature: 98.6
TCO2: 25 mmol/L (ref 0–100)
pCO2 arterial: 81.6 mmHg (ref 32.0–48.0)
pH, Arterial: 7.046 — CL (ref 7.350–7.450)

## 2017-02-22 LAB — APTT: APTT: 39 s — AB (ref 24–36)

## 2017-02-22 LAB — RAPID URINE DRUG SCREEN, HOSP PERFORMED
AMPHETAMINES: NOT DETECTED
BENZODIAZEPINES: POSITIVE — AB
Barbiturates: NOT DETECTED
COCAINE: NOT DETECTED
Opiates: POSITIVE — AB
Tetrahydrocannabinol: NOT DETECTED

## 2017-02-22 LAB — AMMONIA: AMMONIA: 44 umol/L — AB (ref 9–35)

## 2017-02-22 LAB — PROTIME-INR
INR: 1.17
Prothrombin Time: 14.9 seconds (ref 11.4–15.2)

## 2017-02-22 LAB — ETHANOL: Alcohol, Ethyl (B): <5 mg/dL (ref <5)

## 2017-02-22 LAB — GLUCOSE, CAPILLARY
Glucose-Capillary: 106 mg/dL — ABNORMAL HIGH (ref 65–99)
Glucose-Capillary: 90 mg/dL (ref 65–99)

## 2017-02-22 LAB — MRSA PCR SCREENING: MRSA BY PCR: NEGATIVE

## 2017-02-22 LAB — PHOSPHORUS: PHOSPHORUS: 7.1 mg/dL — AB (ref 2.5–4.6)

## 2017-02-22 LAB — MAGNESIUM: Magnesium: 2.2 mg/dL (ref 1.7–2.4)

## 2017-02-22 MED ORDER — MIDAZOLAM HCL 2 MG/2ML IJ SOLN
2.0000 mg | INTRAMUSCULAR | Status: DC | PRN
  Administered 2017-02-23 (×2): 2 mg via INTRAVENOUS
  Filled 2017-02-22 (×2): qty 2

## 2017-02-22 MED ORDER — FENTANYL CITRATE (PF) 100 MCG/2ML IJ SOLN: 100.0000 ug | INTRAMUSCULAR | Status: DC | PRN

## 2017-02-22 MED ORDER — SODIUM CHLORIDE 0.9 % IV SOLN: 250.0000 mL | INTRAVENOUS | Status: DC | PRN

## 2017-02-22 MED ORDER — LEVOFLOXACIN IN D5W 750 MG/150ML IV SOLN
750.0000 mg | INTRAVENOUS | Status: AC
  Administered 2017-02-22: 750 mg via INTRAVENOUS
  Filled 2017-02-22: qty 150

## 2017-02-22 MED ORDER — SODIUM CHLORIDE 0.9 % IV SOLN
100.0000 mg | INTRAVENOUS | Status: DC
  Filled 2017-02-22: qty 100

## 2017-02-22 MED ORDER — SODIUM CHLORIDE 0.9 % IV BOLUS (SEPSIS)
1000.0000 mL | Freq: Once | INTRAVENOUS | Status: AC
  Administered 2017-02-22: 1000 mL via INTRAVENOUS

## 2017-02-22 MED ORDER — SODIUM CHLORIDE 0.9 % IV BOLUS (SEPSIS)
250.0000 mL | Freq: Once | INTRAVENOUS | Status: AC
  Administered 2017-02-22: 250 mL via INTRAVENOUS

## 2017-02-22 MED ORDER — PANTOPRAZOLE SODIUM 40 MG IV SOLR
40.0000 mg | Freq: Every day | INTRAVENOUS | Status: DC
  Administered 2017-02-22: 40 mg via INTRAVENOUS
  Filled 2017-02-22: qty 40

## 2017-02-22 MED ORDER — FENTANYL 2500MCG IN NS 250ML (10MCG/ML) PREMIX INFUSION
25.0000 ug/h | INTRAVENOUS | Status: DC
  Administered 2017-02-22: 50 ug/h via INTRAVENOUS
  Filled 2017-02-22: qty 250

## 2017-02-22 MED ORDER — SODIUM CHLORIDE 0.9 % IV SOLN
INTRAVENOUS | Status: DC
  Administered 2017-02-22 – 2017-02-23 (×2): via INTRAVENOUS

## 2017-02-22 MED ORDER — FENTANYL BOLUS VIA INFUSION
50.0000 ug | INTRAVENOUS | Status: DC | PRN
  Administered 2017-02-23 (×2): 50 ug via INTRAVENOUS
  Filled 2017-02-22: qty 50

## 2017-02-22 MED ORDER — PHENYLEPHRINE 40 MCG/ML (10ML) SYRINGE FOR IV PUSH (FOR BLOOD PRESSURE SUPPORT)
PREFILLED_SYRINGE | INTRAVENOUS | Status: AC
  Filled 2017-02-22: qty 10

## 2017-02-22 MED ORDER — HYDROCORTISONE NA SUCCINATE PF 100 MG IJ SOLR
50.0000 mg | Freq: Four times a day (QID) | INTRAMUSCULAR | Status: DC
  Administered 2017-02-22 – 2017-02-23 (×3): 50 mg via INTRAVENOUS
  Filled 2017-02-22 (×2): qty 1
  Filled 2017-02-22: qty 2
  Filled 2017-02-22: qty 1
  Filled 2017-02-22: qty 2

## 2017-02-22 MED ORDER — CHLORHEXIDINE GLUCONATE 0.12% ORAL RINSE (MEDLINE KIT)
15.0000 mL | Freq: Two times a day (BID) | OROMUCOSAL | Status: DC
  Administered 2017-02-22 – 2017-02-23 (×2): 15 mL via OROMUCOSAL

## 2017-02-22 MED ORDER — ORAL CARE MOUTH RINSE
15.0000 mL | Freq: Four times a day (QID) | OROMUCOSAL | Status: DC
  Administered 2017-02-23 (×3): 15 mL via OROMUCOSAL

## 2017-02-22 MED ORDER — VANCOMYCIN HCL 10 G IV SOLR
1250.0000 mg | INTRAVENOUS | Status: AC
  Administered 2017-02-22: 1250 mg via INTRAVENOUS
  Filled 2017-02-22: qty 1250

## 2017-02-22 MED ORDER — NOREPINEPHRINE BITARTRATE 1 MG/ML IV SOLN
0.0000 ug/min | Freq: Once | INTRAVENOUS | Status: DC
  Filled 2017-02-22: qty 4

## 2017-02-22 MED ORDER — NOREPINEPHRINE BITARTRATE 1 MG/ML IV SOLN
0.0000 ug/min | INTRAVENOUS | Status: DC
  Filled 2017-02-22: qty 4

## 2017-02-22 MED ORDER — PHENYLEPHRINE HCL 10 MG/ML IJ SOLN
INTRAMUSCULAR | Status: AC | PRN
  Administered 2017-02-22: 80 ug
  Administered 2017-02-22: 125 ug

## 2017-02-22 MED ORDER — MIDAZOLAM HCL 2 MG/2ML IJ SOLN
2.0000 mg | INTRAMUSCULAR | Status: AC | PRN
  Administered 2017-02-22 – 2017-02-23 (×3): 2 mg via INTRAVENOUS
  Filled 2017-02-22 (×3): qty 2

## 2017-02-22 MED ORDER — ROCURONIUM BROMIDE 50 MG/5ML IV SOLN
INTRAVENOUS | Status: AC | PRN
  Administered 2017-02-22: 100 mg via INTRAVENOUS

## 2017-02-22 MED ORDER — SODIUM CHLORIDE 0.9 % IV SOLN
500.0000 mg | INTRAVENOUS | Status: AC
  Administered 2017-02-22: 500 mg via INTRAVENOUS
  Filled 2017-02-22: qty 500

## 2017-02-22 MED ORDER — LEVOFLOXACIN IN D5W 500 MG/100ML IV SOLN: 500.0000 mg | INTRAVENOUS | Status: DC

## 2017-02-22 MED ORDER — ETOMIDATE 2 MG/ML IV SOLN
INTRAVENOUS | Status: AC | PRN
  Administered 2017-02-22: 25 mg via INTRAVENOUS

## 2017-02-22 MED ORDER — FENTANYL CITRATE (PF) 100 MCG/2ML IJ SOLN
50.0000 ug | Freq: Once | INTRAMUSCULAR | Status: AC
  Administered 2017-02-22: 50 ug via INTRAVENOUS

## 2017-02-22 MED ORDER — SODIUM CHLORIDE 0.9 % IV SOLN
500.0000 mg | Freq: Two times a day (BID) | INTRAVENOUS | Status: DC
  Administered 2017-02-23: 500 mg via INTRAVENOUS
  Filled 2017-02-22 (×2): qty 500

## 2017-02-22 MED ORDER — HEPARIN SODIUM (PORCINE) 5000 UNIT/ML IJ SOLN
5000.0000 [IU] | Freq: Three times a day (TID) | INTRAMUSCULAR | Status: DC
  Administered 2017-02-22 – 2017-02-23 (×2): 5000 [IU] via SUBCUTANEOUS
  Filled 2017-02-22 (×3): qty 1

## 2017-02-22 MED ORDER — PHENYLEPHRINE 40 MCG/ML (10ML) SYRINGE FOR IV PUSH (FOR BLOOD PRESSURE SUPPORT)
PREFILLED_SYRINGE | INTRAVENOUS | Status: AC
  Filled 2017-02-22: qty 20

## 2017-02-22 MED ORDER — ANIDULAFUNGIN 100 MG IV SOLR
200.0000 mg | INTRAVENOUS | Status: AC
  Administered 2017-02-22: 200 mg via INTRAVENOUS
  Filled 2017-02-22: qty 200

## 2017-02-22 NOTE — ED Provider Notes (Signed)
1:45 PM I made contact with this patient while she was still on the EMS stretcher in the hallway of Pod E. My exam and assessment was limited, as we were instructed to take the patient to Pod A for a room, as none were available in Pod E. There were no vital signs taken in our ED at the time of my assessment.   History provided by patient's father at the bedside. Patient has multiple medical and surgical problems. Because of these, she lives with her parents in their home. Patient was transported via BLS ambulance from [facility]. Father states that patient had abdominal surgery at [facility] about 3 weeks ago to remove a pelvic cyst and bladder reconstruction. 2 days ago she began to "act oddly." She seemed to be a bit confused in her speech. They also report a fever over the last couple days of 102F. This morning, she began to be unsteady on her feet and had to be lowered to the ground. Parents note no other recent abnormalities or complaints by the patient. EMS reports initial SPO2 of 52%, gray overall appearance, and unresponsiveness. SPO2 rose to 98% with 15 L via NRB. Hypotensive at 88 systolic.   Past Medical History:  Diagnosis Date  . Anemia   . Anxiety   . Arthritis    "left knee" (04/24/2016)  . Back pain   . Chronic lower back pain   . Constipation   . Depression   . Edema   . Essential hypertension   . Factor V Leiden (HCC)    [name] 04/24/2016  . Frequent UTI    [name] 04/24/2016  . GERD (gastroesophageal reflux disease)   . Gout   . High cholesterol   . HTN (hypertension)   . Kidney disease   . Migraine    "weekly" (04/24/2016)  . Neurogenic bladder    augmented bladder, I/O self caths  . On home oxygen therapy    "2L; every night" (04/24/2016)  . Osteoporosis   . Ovarian cyst dx'd 04/22/2016   left  . Palpitations   . Pneumonia 03/2016   [name] 04/24/2016  . Renal transplant failure and rejection    age 46-11  . Renal transplant recipient  02/18/1997   x2  . 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 Self-catheterizes urinary bladder    "~ 12 X/day" (04/24/2016)  . SOB (shortness of breath)   . Stroke [facility]) 1991   denies residual on 04/24/2016  . Vitamin D deficiency    Past Surgical History:  Procedure Laterality Date  . ABDOMINAL HERNIA REPAIR  "several; when I was little"  . allograph biopsy  2013   [name] 04/24/2016  . HERNIA REPAIR    . KIDNEY TRANSPLANT  1996; 1998   father was donor; mother was donor/notes 04/24/2016  . PACEMAKER INSERTION  "?early 2000s"   for bladder function  . PARTIAL NEPHRECTOMY  1990s X 5   "removing disease"  . PORTACATH PLACEMENT Right 11/2015  . RIGHT OOPHORECTOMY Right 2001   ovarian torsion    Physical Exam  Constitutional: She appears well-developed and well-nourished. She appears ill. No distress.  HENT:  Head: Normocephalic.  Eyes: Pupils are equal, round, and reactive to light. Conjunctivae are normal.  Neck: Neck supple.  Cardiovascular: Regular rhythm and intact distal pulses.  Tachycardia present.   Pulmonary/Chest: Tachypnea noted. She has decreased breath sounds.  Airway appears to be intact. Nonrebreather at 15 L a minute noted to be in  place.  Neurological: GCS eye subscore is 3. GCS verbal subscore is 1. GCS motor subscore is 4.  Patient would not follow commands. She would not keep her eyes open.  Skin: Skin is warm. Capillary refill takes 2 to 3 seconds. She is diaphoretic. There is pallor.  Nursing note and vitals reviewed.   The above assessment was performed in the short amount of time I had with the patient. She will need complete assessment upon reaching her room in [place] [facility].      Anselm Pancoast, PA-C 02/22/17 1409    Little, Ambrose Finland, MD 02/23/17 2111

## 2017-02-22 NOTE — ED Notes (Signed)
Portable xray at bedside.

## 2017-02-22 NOTE — Code Documentation (Signed)
Pt intubated by Dr. Eudelia Bunch; 7.5 mm ETT, 24 at teeth.

## 2017-02-22 NOTE — ED Notes (Addendum)
IV team at bedside accessing port for admin of levo.

## 2017-02-22 NOTE — ED Notes (Signed)
Family at bedside. 

## 2017-02-22 NOTE — ED Notes (Signed)
ED Provider at bedside. 

## 2017-02-22 NOTE — Progress Notes (Signed)
Pharmacy Antibiotic Note  Donna Shannon is a 31 y.o. female s/p renal transplant x 2 on immunosuppression with recent bladder surgery (left salpingoopherectomy 7/23) who presented into the [facility] on 8/17 after having syncopal episode earlier this week, continued AMS and fevers. Pharmacy consulted to start Levaquin + Eraxis + Primaxin + Vancomycin for empiric abdominal and PNA coverage.   The patient has a history of renal transplant x 2, on immunosuppressants PTA. The patient is noted to be in AKI  (admit 4.61, baseline 2.3-2.8), CrCl<20 ml/min.   Goal of Therapy:  Vancomycin trough level 15-20 mcg/ml  Proper antibiotics for infection/cultures adjusted for renal/hepatic function   Plan:  1. Vancomycin 1250 mg IV x 1 2. No standing doses with AKI, will montor renal function and consider a Vancomycin random to check clearance prior to additional doses 3. Eraxis 200 mg IV x 1 followed by 100 mg IV every 24 hours 4. Primaxin 500 mg IV every 12 hours 5. Levaquin 750 mg IV every 48 hours 6. Will continue to follow renal function, culture results, LOT, and antibiotic de-escalation plans   Height: 5\' 1"  (154.9 cm) IBW/kg (Calculated) : 47.8  Temp (24hrs), Avg:98.8 F (37.1 C), Min:98.7 F (37.1 C), Max:98.9 F (37.2 C)   Recent Labs Lab 02/22/17 1430 02/22/17 1448 02/22/17 1627  CREATININE 4.61*  --   --   LATICACIDVEN  --  0.85 0.59    CrCl cannot be calculated (Unknown ideal weight.).    Allergies  Allergen Reactions  . Sulfa Antibiotics Itching    Antimicrobials this admission: Vanc 8/17 >> Primaxin 8/17 >> Levaquin 8/17 >> Eraxis 8/17 >>  Dose adjustments this admission: n/a  Microbiology results: 8/17 BCx >> 8/17 RCx >> 8/17 UCx >> 8/17 RVP >>  Thank you for allowing pharmacy to be a part of this patient's care.  Georgina Pillion, PharmD, BCPS Clinical Pharmacist Pager: [phone] Clinical phone for 02/22/2017 from 2p-11p: [phone] If after 3:30p, please call  main pharmacy at: [phone] 02/22/2017 5:38 PM

## 2017-02-22 NOTE — Telephone Encounter (Signed)
Pt father calling with concern about pt. Pt recently had surgery  About 3 weeks ago (cyst between bladder) pt father states since Monday pt has had weakness in her legs, shaky trimmers and confusion.  Pt father is a Engineer, structural and is willing to bring her to [facility] if need be but he would like to speak wth Dr Marjory Lies re: his concerns.  He states she has recovered from surgery well but very concerned about confusion and weekness  please call

## 2017-02-22 NOTE — ED Provider Notes (Signed)
[facility] DEPT Provider Note   CSN: [id number] Arrival date & time: 02/22/17  1350     History   Chief Complaint Chief Complaint  Patient presents with  . Shortness of Breath  . Altered Mental Status    HPI Donna Shannon is a 31 y.o. female presenting with altered mental status, hypoxia, and hypotension.   Level V caveat as pt is altered and lethargic.   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 Family states that the past 2 days she's been acting little off. Today she was found to be off balance, and they were worried she was going to pass out, and she had to be lowered to the ground. Family reports fevers up to 102.  Ptbecame harder to arouse, and family states her nailbeds were turning gray. Pulse ox showed sats in the low 50s. Pt was transported via EMS. Sats rose to high 90's with 15L via NRB. Pt hypotensive in the 96Q systolic during transport.  Family states pt has not been complaining of any new sxs the past few days. They deny complaints of pain, cough, nausea, vomiting, urinary sxs, or HAs. Abd incision has been slow to heal, with dehisence of the wound, but no obcious driange or infections.  HPI  Past Medical History:  Diagnosis Date  . Anemia   . Anxiety   . Arthritis    "left knee" (04/24/2016)  . Back pain   . Chronic lower back pain   . Constipation   . Depression   . Edema   . Essential hypertension   . Factor V Leiden ([place])    Archie Endo 04/24/2016  . Frequent UTI    Archie Endo 04/24/2016  . GERD (gastroesophageal reflux disease)   . Gout   . High cholesterol   . HTN (hypertension)   . Kidney disease   . Migraine    "weekly" (04/24/2016)  . Neurogenic bladder    augmented bladder, I/O self caths  . On home oxygen therapy    "2L; every night" (04/24/2016)  . Osteoporosis   . Ovarian cyst dx'd 04/22/2016   left  .  Palpitations   . Pneumonia 03/2016   Archie Endo 04/24/2016  . Renal transplant failure and rejection    age 8-11  . 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 Seizures ([place])   . Self-catheterizes urinary bladder    "~ 12 X/day" (04/24/2016)  . SOB (shortness of breath)   . Stroke [facility]) 1991   denies residual on 04/24/2016  . Vitamin D deficiency     Patient Active Problem List   Diagnosis Date Noted  . ARDS (adult respiratory distress syndrome) ([place]) 02/22/2017  . Class 1 obesity with serious comorbidity and body mass index (BMI) of 30.0 to 30.9 in adult 11/29/2016  . Class 1 obesity without serious comorbidity with body mass index (BMI) of 30.0 to 30.9 in adult 11/14/2016  . Nocturnal hypoxemia 09/24/2016  . Obesity (BMI 30.0-34.9) 09/10/2016  . 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 Vitamin D deficiency 09/10/2016  . Chronic intractable headache   . Acute on chronic diastolic heart failure ([place])   . Headache 07/11/2016  . Intractable headache 07/11/2016  . 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 Hypoxemia 06/25/2016  . Intractable chronic migraine without aura and with status migrainosus 06/25/2016  . Intracranial hypertension   . HA (headache)   .  Chronic renal disease, stage III   . Chronic diastolic heart failure ([place])   . OSA (obstructive sleep apnea)   . Obesity 04/25/2016  . Renal transplant recipient 04/25/2016  . Depression 04/25/2016  . Neurogenic bladder 04/25/2016  . Essential hypertension 04/25/2016  . CKD (chronic kidney disease) stage 2, GFR 60-89 ml/min 04/25/2016  . Volume overload 04/24/2016    Past Surgical History:  Procedure Laterality Date  . ABDOMINAL HERNIA REPAIR  "several; when I was little"  . allograph biopsy  2013   Archie Endo 04/24/2016  . HERNIA REPAIR    . [place]; 1998   father was donor; mother was donor/notes 04/24/2016  . PACEMAKER INSERTION  "?early 2000s"   for bladder  function  . PARTIAL NEPHRECTOMY  1990s X 5   "removing disease"  . PORTACATH PLACEMENT Right 11/2015  . RIGHT OOPHORECTOMY Right 2001   ovarian torsion    OB History    Gravida Para Term Preterm AB Living   0 0 0 0 0 0   SAB TAB Ectopic Multiple Live Births   0 0 0 0 0       Home Medications    Prior to Admission medications   Medication Sig Start Date End Date Taking? Authorizing Provider  Amino Acids-Protein Hydrolys (FEEDING SUPPLEMENT, PRO-STAT SUGAR FREE 64,) LIQD Place 30 mLs into feeding tube 2 (two) times daily. 02/23/17   Parrett, Fonnie Mu, NP  anidulafungin 100 mg in sodium chloride 0.9 % 100 mL Inject 100 mg into the vein daily. 02/23/17   Parrett, Fonnie Mu, NP  chlorhexidine gluconate, MEDLINE KIT, (PERIDEX) 0.12 % solution 15 mLs by Mouth Rinse route 2 (two) times daily. 02/23/17   Parrett, Fonnie Mu, NP  fentaNYL (SUBLIMAZE) SOLN Inject 50 mcg into the vein every hour as needed. 02/23/17   Parrett, Fonnie Mu, NP  fentaNYL 10 mcg/ml SOLN infusion Inject 25-400 mcg/hr into the vein continuous. 02/23/17   Parrett, Fonnie Mu, NP  heparin 5000 UNIT/ML injection Inject 1 mL (5,000 Units total) into the skin every 8 (eight) hours. 02/23/17   Parrett, Fonnie Mu, NP  hydrocortisone sodium succinate (SOLU-CORTEF) 100 MG SOLR injection Inject 1 mL (50 mg total) into the vein every 6 (six) hours. 02/23/17   Parrett, Fonnie Mu, NP  levETIRAcetam 500 mg in sodium chloride 0.9 % 100 mL Inject 500 mg into the vein every 12 (twelve) hours. 02/23/17   Parrett, Fonnie Mu, NP  levofloxacin (LEVAQUIN) 500 MG/100ML SOLN Inject 100 mLs (500 mg total) into the vein every other day. 02/24/17   Parrett, Fonnie Mu, NP  meropenem 500 mg in sodium chloride 0.9 % 50 mL Inject 500 mg into the vein daily. 02/23/17   Parrett, Fonnie Mu, NP  midazolam (VERSED) 2 MG/2ML SOLN injection Inject 2 mLs (2 mg total) into the vein every 2 (two) hours as needed for agitation (to maintain RASS goal.). 02/23/17   Parrett, Fonnie Mu, NP  mouth  rinse LIQD solution 15 mLs by Mouth Rinse route QID. 02/23/17   Parrett, Tammy S, NP  norepinephrine 4 mg in dextrose 5 % 250 mL Inject 0-40 mcg/min into the vein continuous. 02/23/17   Parrett, Fonnie Mu, NP  Nutritional Supplements (FEEDING SUPPLEMENT, VITAL HIGH PROTEIN,) LIQD liquid Place 1,000 mLs into feeding tube daily. 02/24/17   Parrett, Fonnie Mu, NP  pantoprazole (PROTONIX) 40 MG injection Inject 40 mg into the vein at bedtime. 02/23/17   Parrett, Fonnie Mu, NP  sodium chloride 0.9 %  infusion Inject 250 mLs into the vein as needed (if IV carrier fluid needed.). 02/23/17   Parrett, Tammy S, NP  sodium chloride 0.9 % infusion Inject 10 mLs into the vein continuous. 02/23/17   Parrett, Fonnie Mu, NP    Family History Family History  Problem Relation Age of Onset  . Hypertension Mother   . CAD Father        s/p CABG  . Hyperlipidemia Father   . Heart disease Father   . Thyroid disease Father     Social History Social History  Substance Use Topics  . Smoking status: Former Smoker    Packs/day: 0.50    Years: 4.00    Types: Cigarettes    Quit date: 07/09/2014  . Smokeless tobacco: Never Used  . Alcohol use Yes     Comment: 04/24/2016 "might have 2 drinks/month, if that"     Allergies   Sulfa antibiotics   Review of Systems Review of Systems  Unable to perform ROS: Mental status change     Physical Exam Updated Vital Signs BP: 82/51   HR: 106  T: 98.6  Resp: 31   SpO2: 98% on 15L via NRB  Physical Exam  Constitutional: She is oriented to person, place, and time. She appears well-developed and well-nourished.  HENT:  Head: Normocephalic and atraumatic.  Eyes: Pupils are equal, round, and reactive to light. Conjunctivae are normal.  Neck: Normal range of motion.  Cardiovascular: Regular rhythm and intact distal pulses.  Tachycardia present.   No leg swelling  Pulmonary/Chest: Effort normal. Tachypnea noted. No respiratory distress. She has no wheezes. She has no rales.  NRB  at 15L in place  Abdominal: Soft. She exhibits no distension and no mass. There is no guarding.  Central abd incision with dehiscence. Packing seen in the area of dehiscence. No blatantly obvious infection.   Musculoskeletal:  Pt altered and cannot follow commands. Moving all extremities.   Lymphadenopathy:    She has no cervical adenopathy.  Neurological: She is alert and oriented to person, place, and time.  Pt very lethargic. Pt occasionally opens eyes to painful stimuli.   Skin: Skin is warm. No rash noted. She is diaphoretic. There is pallor.  Pt feels warm to touch  Psychiatric:  Unable to assess  Nursing note and vitals reviewed.    ED Treatments / Results  Labs  Lactic acid: 0.59 CMP:     Na: 133    K: 5.8    Cl: 97    CO2: 23    BGL: 93    BUN: 62    SCr: 4.61    Ca: 9.2    Prot: 6.4    Albumin: 2.4    AST: 32    ALT: 13    Alk Phos: 94    Bili (total): 1.3     GFR: 12 CBC with Diff: Pending    Ethanol: <5 Ammonia: 44 UA: Pending UDS: Pending BCx2: Pending  EKG  EKG Interpretation  Date/Time:  Friday February 22 2017 14:32:33 EDT Ventricular Rate:  103 PR Interval:    QRS Duration: 97 QT Interval:  341 QTC Calculation: 447 R Axis:   81 Text Interpretation:  Sinus tachycardia Otherwise no significant change Confirmed by Addison Lank [phone]) on 02/22/2017 3:16:24 PM       Radiology  DG  Portable Chest 1 View  1. Cardiomegaly with diffuse interstitial and alveolar opacity suggests pulmonary edema. Diffuse infection could have this appearance. 1. Right Port-A-Cath  tip is at or potentially through tricuspid valve. Electronically signed By: Misty Stanley M.D. On 02/22/2017 14:55     Procedures Procedures (including critical care time)  Medications Ordered in ED Medications  phenylephrine 0.4-0.9 MG/10ML-% injection (  Canceled Entry 02/22/17 1924)  sodium chloride 0.9 % bolus 1,000 mL (0 mLs Intravenous Stopped 02/22/17 1622)    And  sodium  chloride 0.9 % bolus 1,000 mL (0 mLs Intravenous Stopped 02/22/17 1623)    And  sodium chloride 0.9 % bolus 250 mL (0 mLs Intravenous Stopped 02/22/17 1518)  phenylephrine (NEO-SYNEPHRINE) injection (80 mcg  Given 02/22/17 1507)  etomidate (AMIDATE) injection (25 mg Intravenous Given 02/22/17 1505)  rocuronium (ZEMURON) injection (100 mg Intravenous Given 02/22/17 1506)  midazolam (VERSED) injection 2 mg (2 mg Intravenous Given 02/23/17 0601)  fentaNYL (SUBLIMAZE) injection 50 mcg (50 mcg Intravenous Given 02/22/17 1715)  levofloxacin (LEVAQUIN) IVPB 750 mg (0 mg Intravenous Stopped 02/22/17 2143)  imipenem-cilastatin (PRIMAXIN) 500 mg in sodium chloride 0.9 % 100 mL IVPB (0 mg Intravenous Stopped 02/22/17 1913)  vancomycin (VANCOCIN) 1,250 mg in sodium chloride 0.9 % 250 mL IVPB (0 mg Intravenous Stopped 02/22/17 2139)  anidulafungin (ERAXIS) 200 mg in sodium chloride 0.9 % 200 mL IVPB (0 mg Intravenous Stopped 02/22/17 2203)  sodium chloride 0.9 % bolus 500 mL (500 mLs Intravenous New Bag/Given 02/23/17 0850)  sodium polystyrene (KAYEXALATE) 15 GM/60ML suspension 15 g (15 g Oral Given 02/23/17 0856)  insulin aspart (novoLOG) injection 10 Units (10 Units Intravenous Given 02/23/17 0832)  dextrose 50 % solution 50 mL (50 mLs Intravenous Given 02/23/17 0827)  sodium bicarbonate 150 mEq in dextrose 5 % 1,000 mL infusion ( Intravenous Stopped 02/23/17 1310)  calcium gluconate 1 g in sodium chloride 0.9 % 100 mL IVPB (0 g Intravenous Stopped 02/23/17 0941)  LORazepam (ATIVAN) 2 MG/ML injection (5 mg  Given 02/23/17 0826)  LORazepam (ATIVAN) injection 5 mg (0 mg Intravenous Duplicate 5/64/33 2951)  0.9 %  sodium chloride infusion ( Intravenous New Bag/Given 02/23/17 1145)  levETIRAcetam (KEPPRA) 1,000 mg in sodium chloride 0.9 % 100 mL IVPB (0 mg Intravenous Stopped 02/23/17 0922)  sodium bicarbonate injection 100 mEq (100 mEq Intravenous Given 02/23/17 0923)  LORazepam (ATIVAN) 2 MG/ML injection (5 mg  Given 02/23/17  1413)     Initial Impression / Assessment and Plan / ED Course  I have reviewed the triage vital signs and the nursing notes.  Pertinent labs & imaging results that were available during my care of the patient were reviewed by me and considered in my medical decision making (see chart for details).     Pt presenting altered, hypotensive, tachycardic after a surgery 3 wks ago, and on immunosuppression. Vital signs concerning and mental status concerning. I performed a brief history and physical and was concerned about the stability of the patient. I asked Dr. Leonette Monarch to evaluate the patient. Concern for possible septic shock, PE, CVA, or encephalitis. Placed orders for sepsis protocols, including IVF and sepsis labs. Attending took over pt care.   CRITICAL CARE Performed by: Franchot Heidelberg   Total critical care time: 30 minutes  Critical care time was exclusive of separately billable procedures and treating other patients.  Critical care was necessary to treat or prevent imminent or life-threatening deterioration.  Critical care was time spent personally by me on the following activities: development of treatment plan with patient and/or surrogate as well as nursing, discussions with consultants, evaluation of patient's response to treatment, examination of patient, obtaining  history from patient or surrogate, ordering and performing treatments and interventions, ordering and review of laboratory studies, ordering and review of radiographic studies, pulse oximetry and re-evaluation of patient's condition.   Final Clinical Impressions(s) / ED Diagnoses   Final diagnoses:  Acute respiratory failure with hypoxia and hypercapnia (HCC)  Somnolence  AKI (acute kidney injury) ([place])    New Prescriptions Discharge Medication List as of 02/23/2017  2:46 PM    START taking these medications   Details  Amino Acids-Protein Hydrolys (FEEDING SUPPLEMENT, PRO-STAT SUGAR FREE 64,) LIQD Place 30  mLs into feeding tube 2 (two) times daily., Starting Sat 02/23/2017, Normal    anidulafungin 100 mg in sodium chloride 0.9 % 100 mL Inject 100 mg into the vein daily., Starting Sat 02/23/2017, No Print    chlorhexidine gluconate, MEDLINE KIT, (PERIDEX) 0.12 % solution 15 mLs by Mouth Rinse route 2 (two) times daily., Starting Sat 02/23/2017, Print    !! fentaNYL (SUBLIMAZE) SOLN Inject 50 mcg into the vein every hour as needed., Starting Sat 02/23/2017, No Print    !! fentaNYL 10 mcg/ml SOLN infusion Inject 25-400 mcg/hr into the vein continuous., Starting Sat 02/23/2017, No Print    heparin 5000 UNIT/ML injection Inject 1 mL (5,000 Units total) into the skin every 8 (eight) hours., Starting Sat 02/23/2017, No Print    hydrocortisone sodium succinate (SOLU-CORTEF) 100 MG SOLR injection Inject 1 mL (50 mg total) into the vein every 6 (six) hours., Starting Sat 02/23/2017, No Print    levETIRAcetam 500 mg in sodium chloride 0.9 % 100 mL Inject 500 mg into the vein every 12 (twelve) hours., Starting Sat 02/23/2017, No Print    levofloxacin (LEVAQUIN) 500 MG/100ML SOLN Inject 100 mLs (500 mg total) into the vein every other day., Starting Sun 02/24/2017, No Print    meropenem 500 mg in sodium chloride 0.9 % 50 mL Inject 500 mg into the vein daily., Starting Sat 02/23/2017, No Print    midazolam (VERSED) 2 MG/2ML SOLN injection Inject 2 mLs (2 mg total) into the vein every 2 (two) hours as needed for agitation (to maintain RASS goal.)., Starting Sat 02/23/2017, Normal    mouth rinse LIQD solution 15 mLs by Mouth Rinse route QID., Starting Sat 02/23/2017, No Print    norepinephrine 4 mg in dextrose 5 % 250 mL Inject 0-40 mcg/min into the vein continuous., Starting Sat 02/23/2017, No Print    Nutritional Supplements (FEEDING SUPPLEMENT, VITAL HIGH PROTEIN,) LIQD liquid Place 1,000 mLs into feeding tube daily., Starting Sun 02/24/2017, No Print    pantoprazole (PROTONIX) 40 MG injection Inject 40 mg into the  vein at bedtime., Starting Sat 02/23/2017, No Print    !! sodium chloride 0.9 % infusion Inject 250 mLs into the vein as needed (if IV carrier fluid needed.)., Starting Sat 02/23/2017, No Print    !! sodium chloride 0.9 % infusion Inject 10 mLs into the vein continuous., Starting Sat 02/23/2017, No Print     !! - Potential duplicate medications found. Please discuss with provider.       Franchot Heidelberg, PA-C 02/23/17 2011

## 2017-02-22 NOTE — Code Documentation (Signed)
Pt still unable to tolerate coming off NR. Pt nonresponsive.

## 2017-02-22 NOTE — ED Notes (Signed)
IV team still at bedside for port access.

## 2017-02-22 NOTE — Telephone Encounter (Signed)
Agree with recommendation / plan. -VRP

## 2017-02-22 NOTE — H&P (Signed)
PULMONARY / CRITICAL CARE MEDICINE   Name: Donna Shannon MRN: [id number] DOB: 1985-12-23    ADMISSION DATE:  02/22/2017 CONSULTATION DATE:  02/22/2017  REFERRING MD:  Dr. Eudelia Bunch EDP  CHIEF COMPLAINT:  AMS  HISTORY OF PRESENT ILLNESS:  Patient is encephalopathic and/or intubated. Therefore history has been obtained from chart review. 31 year old female with complex medical history as below, which is significant for Factor V Leiden, Kidney disease s/p renal transplant x2, retroperitoneal fibrosis, self catheterizes bladder, and CVA. She has a long history of abdominal surgeries, most recently she was admitted to [facility] in late July where she underwent left salpingoopherectomy 7/23 and was discharged two days later. Post op course uncomplicated until 8/15 when she developed fevers up to 102F. She then developed confusion and 8/17 suffered a syncopal episode. EMS was called and noted her SpO2 to be 52%. She was transported from [place], [place] to [facility] for evaluation. Upon arrival to ED she was unresponsive and was intubated for airway protection. PCCM asked to evaluate.   PAST MEDICAL HISTORY :  She  has a past medical history of Anemia; Anxiety; Arthritis; Back pain; Chronic lower back pain; Constipation; Depression; Edema; Essential hypertension; Factor V Leiden (HCC); Frequent UTI; GERD (gastroesophageal reflux disease); Gout; High cholesterol; HTN (hypertension); Kidney disease; Migraine; Neurogenic bladder; On home oxygen therapy; Osteoporosis; Ovarian cyst (dx'd 04/22/2016); Palpitations; Pneumonia (03/2016); Renal transplant failure and rejection; Renal transplant recipient (02/18/1997); Retroperitoneal fibrosis; Self-catheterizes urinary bladder; SOB (shortness of breath); Stroke (HCC) (1991); and Vitamin D deficiency.  PAST SURGICAL HISTORY: She  has a past surgical history that includes Pacemaker insertion ("?early 2000s"); Kidney transplant (1996; 1998); Partial nephrectomy  (1990s X 5); allograph biopsy (2013); Portacath placement (Right, 11/2015); Abdominal hernia repair ("several; when I was little"); Hernia repair; and Right oophorectomy (Right, 2001).  Allergies  Allergen Reactions  . Sulfa Antibiotics Itching    No current facility-administered medications on file prior to encounter.    Current Outpatient Prescriptions on File Prior to Encounter  Medication Sig  . acetaminophen-codeine (TYLENOL #4) 300-60 MG tablet Take 1 tablet by mouth every 6 (six) hours as needed for pain.  [name] acetaZOLAMIDE (DIAMOX) 250 MG tablet Take 2 tablets (500 mg total) by mouth 2 (two) times daily. (Patient taking differently: Take 500 mg by mouth daily. )  . aspirin 81 MG chewable tablet Chew 81 mg by mouth daily.  . Cholecalciferol (VITAMIN D-3) 1000 units CAPS Take 1 capsule (1,000 Units total) by mouth daily.  . ciprofloxacin (CIPRO) 500 MG tablet Take 500 mg by mouth 2 (two) times daily.  . clonazePAM (KLONOPIN) 0.5 MG tablet Take 0.5 mg by mouth See admin instructions. Take 2 tablets in the morning, then take one in the afternoon, then take 2 tablets at night per patient  . cycloSPORINE (SANDIMMUNE) 100 MG capsule Take 100 mg by mouth 2 (two) times daily.  . cycloSPORINE (SANDIMMUNE) 25 MG capsule Take 25 mg by mouth every morning.  . escitalopram (LEXAPRO) 20 MG tablet Take 1 tablet (20 mg total) by mouth daily.  . fenofibrate 160 MG tablet Take 160 mg by mouth daily.  . flavoxATE (URISPAS) 100 MG tablet Take 100 mg by mouth 3 (three) times daily. Take every day per patient  . furosemide (LASIX) 40 MG tablet Take 1 tablet (40 mg total) by mouth 2 (two) times daily. (Patient taking differently: Take 20 mg by mouth daily. )  . gabapentin (NEURONTIN) 300 MG capsule Take 300 mg by mouth  daily.   . magnesium oxide (MAG-OX) 400 MG tablet Take 400 mg by mouth daily.   . methylphenidate (RITALIN) 10 MG tablet Take 10 mg by mouth 2 (two) times daily.  . metoprolol (LOPRESSOR) 50  MG tablet Take 1 tablet (50 mg total) by mouth 2 (two) times daily. (Patient taking differently: Take 50 mg by mouth daily. )  . mirabegron ER (MYRBETRIQ) 50 MG TB24 tablet Take 50 mg by mouth at bedtime.  . mycophenolate (CELLCEPT) 500 MG tablet Take 500 mg by mouth 2 (two) times daily.  [name] omega-3 acid ethyl esters (LOVAZA) 1 g capsule Take 1 g by mouth 3 (three) times daily.  . ondansetron (ZOFRAN) 8 MG tablet Take 0.5 tablets (4 mg total) by mouth every 8 (eight) hours as needed for nausea or vomiting.  . pravastatin (PRAVACHOL) 40 MG tablet Take 40 mg by mouth at bedtime.  . predniSONE (DELTASONE) 2.5 MG tablet Take 2.5 mg by mouth daily with breakfast.  . promethazine (PHENERGAN) 25 MG suppository INSERT 1 SUPPOSITORY RECTALLY EVERY 6 HOURS AS NEEDED FOR NAUSEA OR VOMITING  . rizatriptan (MAXALT) 10 MG tablet Take 1 tablet (10 mg total) by mouth daily as needed for migraine. May repeat in 2 hours if needed  . sertraline (ZOLOFT) 100 MG tablet Take 100 mg by mouth 2 (two) times daily. 1/2 pill in the AM, one pill in the PM  . traMADol (ULTRAM) 50 MG tablet Take 50 mg by mouth 2 (two) times daily.     FAMILY HISTORY:  Her indicated that her mother is alive. She indicated that her father is alive.    SOCIAL HISTORY: She  reports that she quit smoking about 2 years ago. Her smoking use included Cigarettes. She has a 2.00 pack-year smoking history. She has never used smokeless tobacco. She reports that she drinks alcohol. She reports that she does not use drugs.  REVIEW OF SYSTEMS:   unable SUBJECTIVE:  unable  VITAL SIGNS: BP 96/60   Pulse (!) 101   Temp 98.7 F (37.1 C) (Rectal)   Resp 17   Ht 5\' 1"  (1.549 m)   SpO2 98%   HEMODYNAMICS:    VENTILATOR SETTINGS: Vent Mode: PRVC FiO2 (%):  [100 %] 100 % Set Rate:  [16 bmp] 16 bmp Vt Set:  [420 mL] 420 mL PEEP:  [5 cmH20] 5 cmH20 Plateau Pressure:  [25 cmH20] 25 cmH20  INTAKE / OUTPUT: No intake/output data  recorded.  PHYSICAL EXAMINATION: General:  Acutely ill appearing young adult in NAD on vent Neuro:  Obtunded HEENT:  [place]/AT, PERRL, no JVD Cardiovascular:  RRR, no MRG Lungs:  Coarse crackles throughout Abdomen:  Midline incision, mostly healed with 2 small open areas weeping serosanguinous drainage. Abdomen soft.  Musculoskeletal:  No acute deformity or ROM limitation Skin:  Grossly intact  LABS:  BMET  Recent Labs Lab 02/22/17 1430  NA 133*  K 5.8*  CL 97*  CO2 23  BUN 62*  CREATININE 4.61*  GLUCOSE 93    Electrolytes  Recent Labs Lab 02/22/17 1430  CALCIUM 9.2    CBC No results for input(s): WBC, HGB, HCT, PLT in the last 168 hours.  Coag's No results for input(s): APTT, INR in the last 168 hours.  Sepsis Markers  Recent Labs Lab 02/22/17 1448  LATICACIDVEN 0.85    ABG No results for input(s): PHART, PCO2ART, PO2ART in the last 168 hours.  Liver Enzymes  Recent Labs Lab 02/22/17 1430  AST 32  ALT 13*  ALKPHOS 94  BILITOT 1.3*  ALBUMIN 2.4*    Cardiac Enzymes No results for input(s): TROPONINI, PROBNP in the last 168 hours.  Glucose No results for input(s): GLUCAP in the last 168 hours.  Imaging Dg Chest Portable 1 View  Result Date: 02/22/2017 CLINICAL DATA:  Hypoxia EXAM: PORTABLE CHEST 1 VIEW COMPARISON:  Study obtained earlier in the day FINDINGS: Endotracheal tube tip is 2.7 cm above the carina. Orogastric tube tip and side port are in the stomach. Port-A-Cath tip is in the right atrium near the tricuspid valve. No pneumothorax. There is diffuse interstitial and alveolar edema bilaterally. Heart is mildly enlarged with pulmonary venous hypertension. No adenopathy. No bone lesions. IMPRESSION: Tube and catheter positions as described without pneumothorax. Widespread interstitial and alveolar opacity, likely edema. There may be a degree of congestive heart failure; pneumonia and/or ARDS may present in this manner. More than one of these  entities may exist concurrently. Lungs and cardiac silhouette appear stable compared to earlier in the day. Electronically Signed   By: Bretta Bang III M.D.   On: 02/22/2017 15:27   Dg Chest Portable 1 View  Result Date: 02/22/2017 CLINICAL DATA:  Weakness with tremors and confusion. EXAM: PORTABLE CHEST 1 VIEW COMPARISON:  07/11/2016 FINDINGS: 1436 hours. The cardio pericardial silhouette is enlarged. Diffuse interstitial and airspace disease is new in the interval. A right-sided Port-A-Cath noted with the tip at her potentially through the tricuspid valve into the right ventricle. The visualized bony structures of the thorax are intact. Telemetry leads overlie the chest. Multiple surgical clips are seen over the upper abdomen IMPRESSION: 1. Cardiomegaly with diffuse interstitial and alveolar opacity suggests pulmonary edema. Diffuse infection could have this appearance. 2. Right Port-A-Cath tip is at or potentially through the tricuspid valve. Electronically Signed   By: Kennith Center M.D.   On: 02/22/2017 14:55     STUDIES:  CT abdomen/pelvis 8/17 >>> CT head 8/17 >>>  CULTURES: Blood 8/17 >>> Urine 8/17 >>> Tracheal aspirate 8/17 >>>  ANTIBIOTICS: Imipenem 8/17 >>> Levofloxacin 8/17 >>> Vancomycin 8/17 >>> Eraxis 8/17 >>>  SIGNIFICANT EVENTS: 8/17 admit for ARDS, sepsis  LINES/TUBES: Port a cath R chest double lumen 8/17 >>>  DISCUSSION: 31 year old female with history of renal transplant and recent salpingoophrectomy admitted 8/17 for ARDS and apparent sepsis.    ASSESSMENT / PLAN:  PULMONARY A: ARDS  P:   ARDS vent bundle > Keep 8cc/kg until acidosis improved.  VAP bundle  Serial ABG with setting changes CXR in AM Keep euvolemic/negative if able  CARDIOVASCULAR A:  HLD  P:  Telemetry monitoring Holding pravastatin  RENAL A:   AKI on CKD Hyperkalemia S/p renal transplant x 2  P:   Follow BMP Foley cath Holding lasix to see how BP trends.   Correct acidosis and repeat K  GASTROINTESTINAL/GENITOURINARY  A:   Retroperitoneal fibrosis S/p Left salpingoophrectomy 01/2017  P:   NPO PPI CT abdomen/pelvis  HEMATOLOGIC A:   Factor V Leiden  P:  CBC pending Check coags Subcutaneous heparin  INFECTIOUS A:   Suspected sepsis: etiology unclear. Potentially intraabdominal source. ARDS vs HCAP. History of frequent UTI as well.  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, prednisone, cyclosporine)  P:   Empiric ABX and antifungal therapy as above Continue cellcept, prednisone, cyclosporine  Trend WBC and fever curve CBC pending  ENDOCRINE A:   No acute issues   P:   Follow glucose on chemistry  NEUROLOGIC A:   Acute  metabolic encephalopathy P:   RASS goal: -1 to -2 PRN fentanyl PRN versed Daily WUA  FAMILY  - Updates: Attempted to contact family  - Inter-disciplinary family meet or Palliative Care meeting due by:  8/23   Joneen Roach, AGACNP-BC [place] Pager [phone] or [phone]  02/22/2017 4:58 PM   STAFF NOTE: Cindi Carbon, MD FACP have personally reviewed patient's available data, including medical history, events of note, physical examination and test results as part of my evaluation. I have discussed with resident/NP and other care providers such as pharmacist, RN and RRT. In addition, I personally evaluated patient and elicited key findings of: not awake, sedated on vent, perrl, port clean without discharge on chest, lungs diffuse ronchi severe, abdo soft, no r/g but wound with some pus on granulation tissue mild, no rash otherwise, pcxr which I reviewed showed severe bilateral diffuse airspace disease infiltrates, fever noted, ARF ATN on top of transplant kidney, she is immunosuppressed and is post op about 3 weeks ago from GYN surgery, source is likely HCAP with ards to r/o Abdominal / wound infection, she needs ards protocol and keep plat less 30, stat abg  noted, increase rate and reduce fio2, repeat abg, place aline with need frequent assessments, add empirtic levo (r/o atypical PNA also as only in hospital 2 days for surgery), imi, vanc, add empiric antifungal until source better defined,  For stat CT abdo / pelvis, add chest, hold feedings for now, hold immune suppresison, get renal consult in am, likley atn, pos balance, ensure sepsis protocol, I did repeat assessment at 410 pm in ER, access port, may need additional access, get cvp if able, may need noninvasive monitoring, await cbc, BC, sputum, urine leg, strep, NO WUA< add fent for vent dyschony likely, she has factor 5, she is nOT on anticoagulation, low threshold to add heparin drip, assess CT prior, not look like PE with fevers bilateral infiltrates, if able may need early bronch for organism The patient is critically ill with multiple organ systems failure and requires high complexity decision making for assessment and support, frequent evaluation and titration of therapies, application of advanced monitoring technologies and extensive interpretation of multiple databases.   Critical Care Time devoted to patient care services described in this note is 90 Minutes. This time reflects time of care of this signee: Rory Percy, MD FACP. This critical care time does not reflect procedure time, or teaching time or supervisory time of PA/NP/Med student/Med Resident etc but could involve care discussion time. Rest per NP/medical resident whose note is outlined above and that I agree with   Mcarthur Rossetti. Tyson Alias, MD, FACP Pgr: [phone] [facility] 02/22/2017 5:09 PM

## 2017-02-22 NOTE — Progress Notes (Addendum)
eLink Physician-Brief Progress Note Patient Name: Donna Shannon DOB: [birth year] MRN: [id number]   Date of Service  02/22/2017  HPI/Events of Note  ABG reviewed CT abd - ? Pyelonephritis, tuboovarian abscess. Mild ileus  eICU Interventions  Reduce TV to 6 cc/kg per ARDS protocol. Increase RR to match minute ventilation Order pelvic ultrasound      Intervention Category Evaluation Type: Other  Serigne Kubicek 02/22/2017, 8:11 PM

## 2017-02-22 NOTE — ED Notes (Signed)
Patient transported to CT with this RN and RT  

## 2017-02-22 NOTE — Procedures (Signed)
Intubation Procedure Note Donna Shannon [id number] Oct 24, 1985  Procedure: Intubation Indications: Airway protection and maintenance  Procedure Details Consent: Unable to obtain consent because of emergent medical necessity. Time Out: Verified patient identification, verified procedure, site/side was marked, verified correct patient position, special equipment/implants available, medications/allergies/relevent history reviewed, required imaging and test results available.  Performed  Maximum sterile technique was used including gloves, gown and mask.  MAC    Evaluation Hemodynamic Status: BP stable throughout; O2 sats: stable throughout Patient's Current Condition: stable Complications: No apparent complications Patient did tolerate procedure well. Chest X-ray ordered to verify placement.  CXR: pending.   Gonzella Lex 02/22/2017

## 2017-02-22 NOTE — ED Provider Notes (Signed)
Medical screening examination/treatment/procedure(s) were conducted as a shared visit with non-physician practitioner(s) and myself.  I personally evaluated the patient during the encounter. Briefly, the patient is a 31 y.o. female with an extensive past medical history including retroperitoneal fibrosis, renal failure status post kidney transplant, neurogenic bladder requiring self-catheterization, diastolic heart failure. She presents today with altered mental status in the setting of hypoxia. Family reported that the patient had been doing fine after her surgery at the end of July for a cyst resection. They did report that the patient had a fever 2 days ago which completely resolved. However, she had been feeling tired and "off." Last night they noted that the patient was a bit lethargic, while she was sleeping they note that the patient kept jerking. Today they noted that patient had difficulty walking. While standing the patient had a syncopal episode. Her father who is an ENT noted that her saturations were in the 75s. She was transported here by EMS on NRB satting 95%. No IV access attained due to difficulty with access. She was hypotensive to SBP of 80s.  On exam patient was lethargic but arousable to gentle stimuli. She was able to answer questions appropriately however, quick to fall back asleep. Pupils were equal round and reactive to light. She was moving all extremities. Noted to be warm to the touch with dry mucous membranes concerning for possible sepsis/dehydration. Patient was hypotensive and tachycardic. Afebrile on rectal temperature. Satting 98% on nonrebreather. Abdominal wound with mild dehiscence but no surrounding erythema or purulent discharge. No apparent tenderness to palpation.  Initial concern for the patient's shock was sepsis however the patient was afebrile. Bedside ultrasound was performed which revealed dynamic heart with good LV contraction and no RV dilatation that would be  concerning for pulmonary embolism. Unable to obtain an IVC visualization due to intra-abdominal scarring.   2 large-bore IVs were obtained and IV fluids were initiated at 30 mL/kg.   Patient's respiratory drive began to decline. She was intubated for airway protection.  Altered mental status labs were obtained. She was noted to be in renal failure with elevated BUN. Not far off her baseline but uremia was considered. A gas was obtained which revealed respiratory acidosis with elevated PCO2 which may be the cause of her altered mental status. Lactic acid was negative; Low Suspicion That This Is an Infectious Process, but further testing stillness to be obtained. Rest of the labs are currently pending. We also obtained a CT head and a non-con abdomen and pelvis, which have yet to be obtained.  I discussed the case with critical care who came and evaluated the patient and will admit for further workup and management.   EKG Interpretation  Date/Time:  Friday February 22 2017 14:32:33 EDT Ventricular Rate:  103 PR Interval:    QRS Duration: 97 QT Interval:  341 QTC Calculation: 447 R Axis:   81 Text Interpretation:  Sinus tachycardia Otherwise no significant change Confirmed by Addison Lank [phone]) on 02/22/2017 3:16:24 PM        CRITICAL CARE Performed by: Grayce Sessions Cardama Total critical care time: 40 minutes Critical care time was exclusive of separately billable procedures and treating other patients. Critical care was necessary to treat or prevent imminent or life-threatening deterioration. Critical care was time spent personally by me on the following activities: development of treatment plan with patient and/or surrogate as well as nursing, discussions with consultants, evaluation of patient's response to treatment, examination of patient, obtaining history from patient  or surrogate, ordering and performing treatments and interventions, ordering and review of laboratory studies,  ordering and review of radiographic studies, pulse oximetry and re-evaluation of patient's condition.  Procedure Name: Intubation Date/Time: 02/22/2017 3:19 PM Performed by: Fatima Blank Pre-anesthesia Checklist: Patient identified, Patient being monitored, Emergency Drugs available, Suction available and Timeout performed Oxygen Delivery Method: Non-rebreather mask Preoxygenation: Pre-oxygenation with 100% oxygen Induction Type: Rapid sequence Ventilation: Mask ventilation without difficulty Laryngoscope Size: Mac and 4 Grade View: Grade III Tube size: 7.5 mm Number of attempts: 1 Placement Confirmation: ETT inserted through vocal cords under direct vision,  CO2 detector and Breath sounds checked- equal and bilateral Secured at: 22 cm Tube secured with: ETT holder Difficulty Due To: Difficulty was unanticipated Comments: NO COMPLICATION WITH PROCEDURE       Patient / Family / Caregiver informed of clinical course, understand medical decision-making process, and agree with plan.  Final diagnoses:  Acute respiratory failure with hypoxia and hypercapnia (HCC)  Somnolence  AKI (acute kidney injury) (HCC)       Cardama, Grayce Sessions, MD 02/23/17 1024

## 2017-02-22 NOTE — ED Triage Notes (Signed)
Pt BIB [employer] for SOB and increased AMS. Per chief, pt had surgery x 3 weeks ago at [facility] for fallopian tube and cyst removal; pt with surgical incision down abd midline on assessment. Per chief, pt has become increasingly more lethargic and SOB the past 2-3 days; pt stood up today and started shaking and fell and was too weak to stand. Pt sats at that time 52% on RA; Pt on 15 L NR on arrival to ED. Pt arouses to voice but not responding to questions or commands. Resp labored. EDP at bedside.   Pt had kidney transplants in 1996 and 1998. Was last seen at [facility] for spinal taps in October and January. Pt has power port to rt chest.

## 2017-02-22 NOTE — Telephone Encounter (Addendum)
Spoke with father who stated his daughter had bladder surgery with Dr Logan Bores at [facility], was in hospital from 7/23-7/25/1/8. She had follow up with surgeon on Monday; her incision was opened. Her father stated he is packing it; it is a small opening with only clear mucus drainage.  He stated that she "fell out" while in the surgeon's office Monday; her legs gave way from under her. He stated a few days ago she had a fever of 102.5, was given Tylenol and has not had a fever since. He stated that today she is "not acting like herself" and her legs remain weak. He stated she does not have a fever today.  Father discussed that she had 2 spinal taps since last Oct, discussed her kidney transplants and recent surgery. He stated he is unsure what is causing her symptoms but thinks it is "neurological".  He stated that is why he called this office. This RN recommended he take her to [facility] for evaluation of all her symptoms.  Father agreed, verbalized understanding, appreciation of call.

## 2017-02-23 ENCOUNTER — Other Ambulatory Visit: Payer: Self-pay

## 2017-02-23 ENCOUNTER — Encounter (HOSPITAL_COMMUNITY): Payer: Self-pay

## 2017-02-23 ENCOUNTER — Inpatient Hospital Stay (HOSPITAL_COMMUNITY): Payer: Medicare Other

## 2017-02-23 DIAGNOSIS — E889 Metabolic disorder, unspecified: Secondary | ICD-10-CM

## 2017-02-23 DIAGNOSIS — N179 Acute kidney failure, unspecified: Secondary | ICD-10-CM

## 2017-02-23 DIAGNOSIS — J9602 Acute respiratory failure with hypercapnia: Secondary | ICD-10-CM

## 2017-02-23 DIAGNOSIS — R400 Somnolence: Secondary | ICD-10-CM

## 2017-02-23 DIAGNOSIS — N7092 Oophoritis, unspecified: Secondary | ICD-10-CM

## 2017-02-23 DIAGNOSIS — J9601 Acute respiratory failure with hypoxia: Secondary | ICD-10-CM

## 2017-02-23 DIAGNOSIS — R569 Unspecified convulsions: Secondary | ICD-10-CM

## 2017-02-23 LAB — GLUCOSE, CAPILLARY
Glucose-Capillary: 105 mg/dL — ABNORMAL HIGH (ref 65–99)
Glucose-Capillary: 112 mg/dL — ABNORMAL HIGH (ref 65–99)
Glucose-Capillary: 114 mg/dL — ABNORMAL HIGH (ref 65–99)
Glucose-Capillary: 95 mg/dL (ref 65–99)
Glucose-Capillary: 99 mg/dL (ref 65–99)

## 2017-02-23 LAB — CBC
HCT: 23.1 % — ABNORMAL LOW (ref 36.0–46.0)
HEMOGLOBIN: 6.7 g/dL — AB (ref 12.0–15.0)
MCH: 28.2 pg (ref 26.0–34.0)
MCHC: 29 g/dL — ABNORMAL LOW (ref 30.0–36.0)
MCV: 97.1 fL (ref 78.0–100.0)
Platelets: 267 10*3/uL (ref 150–400)
RBC: 2.38 MIL/uL — AB (ref 3.87–5.11)
RDW: 15.4 % (ref 11.5–15.5)
WBC: 10.3 10*3/uL (ref 4.0–10.5)

## 2017-02-23 LAB — RESPIRATORY PANEL BY PCR
Adenovirus: NOT DETECTED
BORDETELLA PERTUSSIS-RVPCR: NOT DETECTED
CORONAVIRUS HKU1-RVPPCR: NOT DETECTED
Chlamydophila pneumoniae: NOT DETECTED
Coronavirus 229E: NOT DETECTED
Coronavirus NL63: NOT DETECTED
Coronavirus OC43: NOT DETECTED
INFLUENZA A H1 2009-RVPPR: NOT DETECTED
INFLUENZA A H3-RVPPCR: NOT DETECTED
INFLUENZA A-RVPPCR: NOT DETECTED
Influenza A H1: NOT DETECTED
Influenza B: NOT DETECTED
Metapneumovirus: NOT DETECTED
Mycoplasma pneumoniae: NOT DETECTED
PARAINFLUENZA VIRUS 2-RVPPCR: NOT DETECTED
Parainfluenza Virus 1: NOT DETECTED
Parainfluenza Virus 3: NOT DETECTED
Parainfluenza Virus 4: NOT DETECTED
RHINOVIRUS / ENTEROVIRUS - RVPPCR: NOT DETECTED
Respiratory Syncytial Virus: NOT DETECTED

## 2017-02-23 LAB — URINALYSIS, ROUTINE W REFLEX MICROSCOPIC
BACTERIA UA: NONE SEEN
Bilirubin Urine: NEGATIVE
GLUCOSE, UA: NEGATIVE mg/dL
Hgb urine dipstick: NEGATIVE
KETONES UR: NEGATIVE mg/dL
Leukocytes, UA: NEGATIVE
Nitrite: NEGATIVE
Protein, ur: 30 mg/dL — AB
RBC / HPF: NONE SEEN RBC/hpf (ref 0–5)
Specific Gravity, Urine: 1.019 (ref 1.005–1.030)
Squamous Epithelial / LPF: NONE SEEN
pH: 5 (ref 5.0–8.0)

## 2017-02-23 LAB — BLOOD CULTURE ID PANEL (REFLEXED)
Acinetobacter baumannii: NOT DETECTED
CANDIDA ALBICANS: NOT DETECTED
CANDIDA GLABRATA: NOT DETECTED
CANDIDA KRUSEI: NOT DETECTED
CANDIDA PARAPSILOSIS: NOT DETECTED
Candida tropicalis: NOT DETECTED
Carbapenem resistance: NOT DETECTED
ENTEROBACTER CLOACAE COMPLEX: NOT DETECTED
ESCHERICHIA COLI: NOT DETECTED
Enterobacteriaceae species: NOT DETECTED
Enterococcus species: NOT DETECTED
Haemophilus influenzae: NOT DETECTED
KLEBSIELLA OXYTOCA: NOT DETECTED
KLEBSIELLA PNEUMONIAE: NOT DETECTED
Listeria monocytogenes: NOT DETECTED
Methicillin resistance: DETECTED — AB
Neisseria meningitidis: NOT DETECTED
PSEUDOMONAS AERUGINOSA: NOT DETECTED
Proteus species: NOT DETECTED
STAPHYLOCOCCUS AUREUS BCID: NOT DETECTED
STREPTOCOCCUS AGALACTIAE: NOT DETECTED
STREPTOCOCCUS PNEUMONIAE: NOT DETECTED
Serratia marcescens: NOT DETECTED
Staphylococcus species: DETECTED — AB
Streptococcus pyogenes: NOT DETECTED
Streptococcus species: NOT DETECTED
Vancomycin resistance: NOT DETECTED

## 2017-02-23 LAB — BLOOD GAS, ARTERIAL
ACID-BASE DEFICIT: 6.6 mmol/L — AB (ref 0.0–2.0)
Acid-base deficit: 10.7 mmol/L — ABNORMAL HIGH (ref 0.0–2.0)
BICARBONATE: 17.5 mmol/L — AB (ref 20.0–28.0)
BICARBONATE: 19 mmol/L — AB (ref 20.0–28.0)
Drawn by: 280981
Drawn by: 280981
FIO2: 50
FIO2: 50
LHR: 35 {breaths}/min
LHR: 35 {breaths}/min
MECHVT: 330 mL
MECHVT: 400 mL
O2 SAT: 91.6 %
O2 SAT: 98.8 %
PATIENT TEMPERATURE: 98.6
PCO2 ART: 59.3 mmHg — AB (ref 32.0–48.0)
PEEP/CPAP: 5 cmH2O
PEEP/CPAP: 5 cmH2O
PH ART: 7.097 — AB (ref 7.350–7.450)
Patient temperature: 98.6
pCO2 arterial: 42.2 mmHg (ref 32.0–48.0)
pH, Arterial: 7.277 — ABNORMAL LOW (ref 7.350–7.450)
pO2, Arterial: 71.4 mmHg — ABNORMAL LOW (ref 83.0–108.0)
pO2, Arterial: 138 mmHg — ABNORMAL HIGH (ref 83.0–108.0)

## 2017-02-23 LAB — POCT I-STAT 3, ART BLOOD GAS (G3+)
ACID-BASE DEFICIT: 6 mmol/L — AB (ref 0.0–2.0)
Acid-base deficit: 6 mmol/L — ABNORMAL HIGH (ref 0.0–2.0)
BICARBONATE: 19.8 mmol/L — AB (ref 20.0–28.0)
Bicarbonate: 21 mmol/L (ref 20.0–28.0)
O2 Saturation: 96 %
O2 Saturation: 99 %
PCO2 ART: 51.2 mmHg — AB (ref 32.0–48.0)
PCO2 ART: 38.2 mmHg (ref 32.0–48.0)
PH ART: 7.324 — AB (ref 7.350–7.450)
Patient temperature: 98.3
TCO2: 23 mmol/L (ref 0–100)
TCO2: 21 mmol/L (ref 0–100)
pH, Arterial: 7.22 — ABNORMAL LOW (ref 7.350–7.450)
pO2, Arterial: 99 mmHg (ref 83.0–108.0)
pO2, Arterial: 159 mmHg — ABNORMAL HIGH (ref 83.0–108.0)

## 2017-02-23 LAB — BASIC METABOLIC PANEL
Anion gap: 12 (ref 5–15)
BUN: 62 mg/dL — ABNORMAL HIGH (ref 6–20)
CHLORIDE: 104 mmol/L (ref 101–111)
CO2: 17 mmol/L — AB (ref 22–32)
CREATININE: 4.14 mg/dL — AB (ref 0.44–1.00)
Calcium: 8.2 mg/dL — ABNORMAL LOW (ref 8.9–10.3)
GFR calc Af Amer: 15 mL/min — ABNORMAL LOW (ref >60)
GFR calc non Af Amer: 13 mL/min — ABNORMAL LOW (ref >60)
GLUCOSE: 113 mg/dL — AB (ref 65–99)
Potassium: 5.6 mmol/L — ABNORMAL HIGH (ref 3.5–5.1)
Sodium: 133 mmol/L — ABNORMAL LOW (ref 135–145)

## 2017-02-23 LAB — NA AND K (SODIUM & POTASSIUM), RAND UR
Potassium Urine: 46 mmol/L
SODIUM UR: 21 mmol/L

## 2017-02-23 LAB — PREPARE RBC (CROSSMATCH)

## 2017-02-23 LAB — MAGNESIUM: Magnesium: 2.5 mg/dL — ABNORMAL HIGH (ref 1.7–2.4)

## 2017-02-23 LAB — ABO/RH: ABO/RH(D): O POS

## 2017-02-23 LAB — STREP PNEUMONIAE URINARY ANTIGEN: STREP PNEUMO URINARY ANTIGEN: NEGATIVE

## 2017-02-23 LAB — LACTIC ACID, PLASMA: Lactic Acid, Venous: 0.7 mmol/L (ref 0.5–1.9)

## 2017-02-23 LAB — PHOSPHORUS: PHOSPHORUS: 7.1 mg/dL — AB (ref 2.5–4.6)

## 2017-02-23 MED ORDER — ORAL CARE MOUTH RINSE: 15.0000 mL | Freq: Four times a day (QID) | OROMUCOSAL | 0 refills | Status: DC

## 2017-02-23 MED ORDER — HEPARIN SODIUM (PORCINE) 5000 UNIT/ML IJ SOLN: 5000.0000 [IU] | Freq: Three times a day (TID) | INTRAMUSCULAR | Status: DC

## 2017-02-23 MED ORDER — SODIUM CHLORIDE 0.9 % IV SOLN
500.0000 mg | INTRAVENOUS | Status: DC
  Filled 2017-02-23: qty 0.5

## 2017-02-23 MED ORDER — SODIUM BICARBONATE 8.4 % IV SOLN
100.0000 meq | Freq: Once | INTRAVENOUS | Status: AC
  Administered 2017-02-23: 100 meq via INTRAVENOUS
  Filled 2017-02-23: qty 100

## 2017-02-23 MED ORDER — FENTANYL BOLUS VIA INFUSION: 50.0000 ug | INTRAVENOUS | 0 refills | Status: DC | PRN

## 2017-02-23 MED ORDER — PRO-STAT SUGAR FREE PO LIQD
30.0000 mL | Freq: Two times a day (BID) | ORAL | Status: DC
  Filled 2017-02-23: qty 30

## 2017-02-23 MED ORDER — INSULIN ASPART 100 UNIT/ML IV SOLN
10.0000 [IU] | Freq: Once | INTRAVENOUS | Status: AC
  Administered 2017-02-23: 10 [IU] via INTRAVENOUS

## 2017-02-23 MED ORDER — DEXTROSE 50 % IV SOLN
1.0000 | Freq: Once | INTRAVENOUS | Status: AC
  Administered 2017-02-23: 50 mL via INTRAVENOUS
  Filled 2017-02-23: qty 50

## 2017-02-23 MED ORDER — SODIUM CHLORIDE 0.9 % IV BOLUS (SEPSIS)
500.0000 mL | Freq: Once | INTRAVENOUS | Status: AC
  Administered 2017-02-23: 500 mL via INTRAVENOUS

## 2017-02-23 MED ORDER — SODIUM POLYSTYRENE SULFONATE 15 GM/60ML PO SUSP
15.0000 g | Freq: Once | ORAL | Status: AC
  Administered 2017-02-23: 15 g via ORAL
  Filled 2017-02-23: qty 60

## 2017-02-23 MED ORDER — SODIUM CHLORIDE 0.9 % IV SOLN
Freq: Once | INTRAVENOUS | Status: AC
  Administered 2017-02-23: 12:00:00 via INTRAVENOUS

## 2017-02-23 MED ORDER — SODIUM CHLORIDE 0.9 % IV SOLN: 100.0000 mg | INTRAVENOUS | Status: DC

## 2017-02-23 MED ORDER — LORAZEPAM 2 MG/ML IJ SOLN
INTRAMUSCULAR | Status: AC
  Administered 2017-02-23: 5 mg
  Filled 2017-02-23: qty 3

## 2017-02-23 MED ORDER — LEVOFLOXACIN IN D5W 500 MG/100ML IV SOLN: 500.0000 mg | INTRAVENOUS | Status: DC

## 2017-02-23 MED ORDER — MIDAZOLAM HCL 2 MG/2ML IJ SOLN
2.0000 mg | INTRAMUSCULAR | 0 refills | Status: DC | PRN
Start: 2017-02-23 — End: 2017-03-07

## 2017-02-23 MED ORDER — PANTOPRAZOLE SODIUM 40 MG IV SOLR: 40.0000 mg | Freq: Every day | INTRAVENOUS | Status: DC

## 2017-02-23 MED ORDER — CALCIUM GLUCONATE 10 % IV SOLN
1.0000 g | Freq: Once | INTRAVENOUS | Status: AC
  Administered 2017-02-23: 1 g via INTRAVENOUS
  Filled 2017-02-23: qty 10

## 2017-02-23 MED ORDER — PRO-STAT SUGAR FREE PO LIQD: 30.0000 mL | Freq: Two times a day (BID) | ORAL | 0 refills | Status: DC

## 2017-02-23 MED ORDER — CHLORHEXIDINE GLUCONATE 0.12% ORAL RINSE (MEDLINE KIT): 15.0000 mL | Freq: Two times a day (BID) | OROMUCOSAL | 0 refills | Status: DC

## 2017-02-23 MED ORDER — NOREPINEPHRINE BITARTRATE 1 MG/ML IV SOLN: 0.0000 ug/min | INTRAVENOUS | Status: DC

## 2017-02-23 MED ORDER — HYDROCORTISONE NA SUCCINATE PF 100 MG IJ SOLR: 50.0000 mg | Freq: Four times a day (QID) | INTRAMUSCULAR | Status: DC

## 2017-02-23 MED ORDER — SODIUM CHLORIDE 0.9 % IV SOLN: 250.0000 mL | INTRAVENOUS | 0 refills | Status: DC | PRN

## 2017-02-23 MED ORDER — SODIUM CHLORIDE 0.9 % IV SOLN
500.0000 mg | Freq: Two times a day (BID) | INTRAVENOUS | Status: DC
  Filled 2017-02-23 (×2): qty 5

## 2017-02-23 MED ORDER — SODIUM CHLORIDE 0.9 % IV SOLN
1000.0000 mg | Freq: Once | INTRAVENOUS | Status: AC
  Administered 2017-02-23: 1000 mg via INTRAVENOUS
  Filled 2017-02-23: qty 10

## 2017-02-23 MED ORDER — VITAL HIGH PROTEIN PO LIQD: 1000.0000 mL | ORAL | Status: DC

## 2017-02-23 MED ORDER — SODIUM CHLORIDE 0.9 % IV SOLN: 500.0000 mg | Freq: Two times a day (BID) | INTRAVENOUS | Status: DC

## 2017-02-23 MED ORDER — SODIUM CHLORIDE 0.9 % IV SOLN: 10.0000 mL | INTRAVENOUS | 0 refills | Status: DC

## 2017-02-23 MED ORDER — LORAZEPAM 2 MG/ML IJ SOLN: 5.0000 mg | Freq: Once | INTRAMUSCULAR | Status: AC

## 2017-02-23 MED ORDER — SODIUM CHLORIDE 0.9 % IV SOLN: 500.0000 mg | INTRAVENOUS | Status: DC

## 2017-02-23 MED ORDER — SODIUM BICARBONATE 8.4 % IV SOLN
Freq: Once | INTRAVENOUS | Status: AC
  Administered 2017-02-23: 09:00:00 via INTRAVENOUS
  Filled 2017-02-23: qty 150

## 2017-02-23 MED ORDER — FENTANYL 2500MCG IN NS 250ML (10MCG/ML) PREMIX INFUSION: 25.0000 ug/h | INTRAVENOUS | Status: DC

## 2017-02-23 NOTE — Procedures (Signed)
Date of recording 02/23/2017  Referring physician Dr Aroor  Reason for the study 31 year old female with altered mental status and syncope currently intubated with sedation  Technical Digital EEG recording using 10-20 international electrode system.  Description of the recording. Posterior down rhythm is 6-7 Hz symmetrical  EEG comprises of generalized delta and theta slowing without any focal signs. Epileptiform features were not seen during this recording.  Impression The EEG is abnormal and findings are suggestive of mild to moderate generalized cerebral dysfunction of nonspecific etiology. Epileptiform features were not seen during this recording.

## 2017-02-23 NOTE — Progress Notes (Addendum)
PULMONARY / CRITICAL CARE MEDICINE   Name: Donna Shannon MRN: [id number] DOB: 08/11/1985    ADMISSION DATE:  02/22/2017 CONSULTATION DATE:  02/22/2017  REFERRING MD:  Dr. Eudelia Bunch EDP  CHIEF COMPLAINT:  AMS  HISTORY OF PRESENT ILLNESS:  Patient is encephalopathic and/or intubated. Therefore history has been obtained from chart review. 31 year old female with complex medical history as below, which is significant for Factor V Leiden, Kidney disease s/p renal transplant x2, retroperitoneal fibrosis, self catheterizes bladder, and CVA. She has a long history of abdominal surgeries, most recently she was admitted to [facility] in late July where she underwent left salpingoopherectomy 7/23 and was discharged two days later. Post op course uncomplicated until 8/15 when she developed fevers up to 102F. She then developed confusion and 8/17 suffered a syncopal episode. EMS was called and noted her SpO2 to be 52%. She was transported from [place], [place] to [facility] for evaluation. Upon arrival to ED she was unresponsive and was intubated for airway protection. PCCM asked to evaluate.   SUBJECTIVE:  No pressors CT with concerns pyelo , abscess  VITAL SIGNS: BP (!) 89/56   Pulse 87   Temp 98.2 F (36.8 C)   Resp 17   Ht 5\' 2"  (1.575 m)   Wt 70.9 kg (156 lb 4.9 oz)   SpO2 98%   BMI 28.59 kg/m   HEMODYNAMICS:    VENTILATOR SETTINGS: Vent Mode: PRVC FiO2 (%):  [50 %-100 %] 50 % Set Rate:  [16 bmp-35 bmp] 35 bmp Vt Set:  [330 mL-420 mL] 330 mL PEEP:  [5 cmH20] 5 cmH20 Plateau Pressure:  [25 cmH20-26 cmH20] 25 cmH20  INTAKE / OUTPUT: I/O last 3 completed shifts: In: 2946.1 [I.V.:186.1; IV Piggyback:2760] Out: 145 [Urine:145]  PHYSICAL EXAMINATION: General: rass 0, fc Neuro: fc, rass 0 , calm HEENT: jvd wnl to low PULM: reduced, ronchi CV:  s1 s2 RRR GI: soft, bs hypo, no r, wound pus noted on skin Extremities: gen edema   LABS:  BMET  Recent Labs Lab 02/22/17 1430  02/22/17 2156  NA 133* 133*  K 5.8* 6.0*  CL 97* 104  CO2 23 17*  BUN 62* 58*  CREATININE 4.61* 4.17*  GLUCOSE 93 108*    Electrolytes  Recent Labs Lab 02/22/17 1430 02/22/17 2156  CALCIUM 9.2 8.1*  MG  --  2.2  PHOS  --  7.1*    CBC  Recent Labs Lab 02/22/17 2156  WBC 10.6*  HGB 7.3*  HCT 24.9*  PLT 257    Coag's  Recent Labs Lab 02/22/17 1430  APTT 39*  INR 1.17    Sepsis Markers  Recent Labs Lab 02/22/17 1448 02/22/17 1627  LATICACIDVEN 0.85 0.59    ABG  Recent Labs Lab 02/22/17 1622 02/22/17 1850 02/23/17 0248  PHART 7.046* 7.286* 7.220*  PCO2ART 81.6* 41.6 51.2*  PO2ART 267.0* 161.0* 99.0    Liver Enzymes  Recent Labs Lab 02/22/17 1430  AST 32  ALT 13*  ALKPHOS 94  BILITOT 1.3*  ALBUMIN 2.4*    Cardiac Enzymes No results for input(s): TROPONINI, PROBNP in the last 168 hours.  Glucose  Recent Labs Lab 02/22/17 1754 02/22/17 2033 02/23/17 0016 02/23/17 0416  GLUCAP 106* 90 105* 99    Imaging Ct Abdomen Pelvis Wo Contrast  Result Date: 02/22/2017 CLINICAL DATA:  Fevers.  Prior renal cell transplant. EXAM: CT CHEST, ABDOMEN AND PELVIS WITHOUT CONTRAST TECHNIQUE: Multidetector CT imaging of the chest, abdomen and pelvis was performed  following the standard protocol without IV contrast. COMPARISON:  Chest radiograph February 22, 2017 ; CT abdomen and pelvis April 28, 2016 FINDINGS: CT CHEST FINDINGS Cardiovascular: There is no thoracic aortic aneurysm. Visualized great vessels appear unremarkable. There is no appreciable pericardial effusion. There is a central catheter with the tip just beyond the tricuspid valve in the right ventricle. Mediastinum/Nodes: Visualized thyroid appears unremarkable. There is no appreciable thoracic adenopathy. Lungs/Pleura: Endotracheal tube tip is in the mid tracheal region. There is widespread interstitial and alveolar opacity throughout the lungs bilaterally. There is a fairly small right  pleural effusion with a rather minimal left pleural effusion. Musculoskeletal: No blastic or lytic bone lesions. CT ABDOMEN PELVIS FINDINGS Hepatobiliary: Liver measures 21.0 cm in length. No focal liver lesions are evident on this noncontrast enhanced study. Gallbladder is mildly distended without wall thickening evident. No appreciable biliary duct dilatation. Pancreas: No pancreatic mass or inflammatory focus. Spleen: No splenic lesions are evident. Adrenals/Urinary Tract: Adrenals appear unremarkable bilaterally. The patient has had surgical removal of native kidneys bilaterally. There is a transplant kidney in the left upper to mid pelvis. There is no transplant kidney mass or hydronephrosis. There is stranding in the pre nephric soft tissues of the left abdomen in the region of the kidney. There is no renal or ureteral calculus evident. Urinary bladder is midline with wall thickness within normal limits. Stomach/Bowel: There is mild distention of the rectum with stool and air. Most small bowel loops are filled with fluid. Stomach is filled with fluid material and air with slight gastric distention. Nasogastric tube tip is in the stomach. There is no appreciable bowel wall thickening. No bowel obstruction. No free air or portal venous air evident. Vascular/Lymphatic: There is no abdominal aortic aneurysm. No vascular lesions are evident on this noncontrast enhanced study. There is no appreciable adenopathy in the abdomen or pelvis. Reproductive: Uterus is overall normal in size and contour. Right ovary is not seen and by report is absent. Left ovary appears mildly prominent. There is soft tissue thickening in the left adnexal region with mild soft tissue stranding in this area. A well-defined mass is not seen in this area. Other: Appendix not seen. There is no periappendiceal region inflammation. There is no abscess or ascites in the abdomen or pelvis. There is scarring in the anterior abdominal wall. There are  surgical clips throughout the abdomen and pelvis, also present on prior study. Musculoskeletal: There are no blastic or lytic bone lesions. There is no intramuscular lesion. IMPRESSION: CT chest: 1. Widespread interstitial and alveolar opacity with small pleural effusions, larger on the right than on the left. Heart is prominent without pericardial effusion. The appearance suggest congestive heart failure. There may well be superimposed pneumonia and/ or ARDS. No pneumothorax evident. 2.  No evident adenopathy. 3. Central catheter tip just beyond tricuspid valve in the right ventricle. CT abdomen and pelvis: 1. Status post nephrectomies bilaterally with transplant kidney in left upper to mid pelvis. There is soft tissue stranding in the perirenal region on the left raising concern for a degree of pyelonephritis on the left in the transplant kidney. No renal mass or abscess seen in this area. No hydronephrosis. No left transplant renal or ureteral calculus. 2. Right ovary absent. Left ovary mildly prominent without mass. There is thickening of soft tissue in the adnexal/fallopian tube region on the left with mild soft tissue stranding. This finding potentially could indicate a degree of inflammation with potential for developing tubo-ovarian abscess on the  left. Well-defined abscess not seen by CT, however. Early torsion conceivably could present in this manner involving the left ovary. This finding may warrant pelvic ultrasound to further evaluate in this regard. 3. Most small bowel loops are fluid-filled. This finding may be seen normally but also may be seen with early ileus or enteritis. No bowel obstruction. Stomach mildly distended with fluid and air with orogastric tube tip in stomach. 4. No periappendiceal region inflammation. No abscess in the abdomen or pelvis. 5. Prominent liver without focal lesions seen on this noncontrast enhanced study. Gallbladder mildly distended without evident wall thickening.  Electronically Signed   By: Bretta Bang III M.D.   On: 02/22/2017 18:00   Ct Head Wo Contrast  Result Date: 02/22/2017 CLINICAL DATA:  Unresponsive.  Recent confusion and syncope EXAM: CT HEAD WITHOUT CONTRAST TECHNIQUE: Contiguous axial images were obtained from the base of the skull through the vertex without intravenous contrast. COMPARISON:  July 11, 2016 FINDINGS: Brain: The ventricles are normal in size and configuration. There is no intracranial mass, hemorrhage, extra-axial fluid collection, or midline shift. Gray-white compartments are normal. No evident acute infarct. Vascular: No hyperdense vessel. There is no appreciable no vascular calcification. Skull: The bony calvarium appears intact. Sinuses/Orbits: Patient is intubated. There is mucosal thickening in multiple ethmoid air cells bilaterally. There is mild mucosal thickening in the inferior maxillary antra bilaterally. Paranasal sinuses elsewhere clear. Orbits appear symmetric bilaterally. Other: Mastoid air cells are clear. Proximal orogastric tube may be coiled in the patient's mouth/pharynx. IMPRESSION: Areas of paranasal sinus disease, likely exacerbated by intubation. Proximal orogastric tube appears to be coiled in the mouth and upper pharynx. No intracranial mass, hemorrhage, or extra-axial fluid collection. Gray-white compartments appear normal. Electronically Signed   By: Bretta Bang III M.D.   On: 02/22/2017 17:44   Ct Chest Wo Contrast  Result Date: 02/22/2017 CLINICAL DATA:  Fevers.  Prior renal cell transplant. EXAM: CT CHEST, ABDOMEN AND PELVIS WITHOUT CONTRAST TECHNIQUE: Multidetector CT imaging of the chest, abdomen and pelvis was performed following the standard protocol without IV contrast. COMPARISON:  Chest radiograph February 22, 2017 ; CT abdomen and pelvis April 28, 2016 FINDINGS: CT CHEST FINDINGS Cardiovascular: There is no thoracic aortic aneurysm. Visualized great vessels appear unremarkable. There is  no appreciable pericardial effusion. There is a central catheter with the tip just beyond the tricuspid valve in the right ventricle. Mediastinum/Nodes: Visualized thyroid appears unremarkable. There is no appreciable thoracic adenopathy. Lungs/Pleura: Endotracheal tube tip is in the mid tracheal region. There is widespread interstitial and alveolar opacity throughout the lungs bilaterally. There is a fairly small right pleural effusion with a rather minimal left pleural effusion. Musculoskeletal: No blastic or lytic bone lesions. CT ABDOMEN PELVIS FINDINGS Hepatobiliary: Liver measures 21.0 cm in length. No focal liver lesions are evident on this noncontrast enhanced study. Gallbladder is mildly distended without wall thickening evident. No appreciable biliary duct dilatation. Pancreas: No pancreatic mass or inflammatory focus. Spleen: No splenic lesions are evident. Adrenals/Urinary Tract: Adrenals appear unremarkable bilaterally. The patient has had surgical removal of native kidneys bilaterally. There is a transplant kidney in the left upper to mid pelvis. There is no transplant kidney mass or hydronephrosis. There is stranding in the pre nephric soft tissues of the left abdomen in the region of the kidney. There is no renal or ureteral calculus evident. Urinary bladder is midline with wall thickness within normal limits. Stomach/Bowel: There is mild distention of the rectum with stool and air. Most  small bowel loops are filled with fluid. Stomach is filled with fluid material and air with slight gastric distention. Nasogastric tube tip is in the stomach. There is no appreciable bowel wall thickening. No bowel obstruction. No free air or portal venous air evident. Vascular/Lymphatic: There is no abdominal aortic aneurysm. No vascular lesions are evident on this noncontrast enhanced study. There is no appreciable adenopathy in the abdomen or pelvis. Reproductive: Uterus is overall normal in size and contour.  Right ovary is not seen and by report is absent. Left ovary appears mildly prominent. There is soft tissue thickening in the left adnexal region with mild soft tissue stranding in this area. A well-defined mass is not seen in this area. Other: Appendix not seen. There is no periappendiceal region inflammation. There is no abscess or ascites in the abdomen or pelvis. There is scarring in the anterior abdominal wall. There are surgical clips throughout the abdomen and pelvis, also present on prior study. Musculoskeletal: There are no blastic or lytic bone lesions. There is no intramuscular lesion. IMPRESSION: CT chest: 1. Widespread interstitial and alveolar opacity with small pleural effusions, larger on the right than on the left. Heart is prominent without pericardial effusion. The appearance suggest congestive heart failure. There may well be superimposed pneumonia and/ or ARDS. No pneumothorax evident. 2.  No evident adenopathy. 3. Central catheter tip just beyond tricuspid valve in the right ventricle. CT abdomen and pelvis: 1. Status post nephrectomies bilaterally with transplant kidney in left upper to mid pelvis. There is soft tissue stranding in the perirenal region on the left raising concern for a degree of pyelonephritis on the left in the transplant kidney. No renal mass or abscess seen in this area. No hydronephrosis. No left transplant renal or ureteral calculus. 2. Right ovary absent. Left ovary mildly prominent without mass. There is thickening of soft tissue in the adnexal/fallopian tube region on the left with mild soft tissue stranding. This finding potentially could indicate a degree of inflammation with potential for developing tubo-ovarian abscess on the left. Well-defined abscess not seen by CT, however. Early torsion conceivably could present in this manner involving the left ovary. This finding may warrant pelvic ultrasound to further evaluate in this regard. 3. Most small bowel loops are  fluid-filled. This finding may be seen normally but also may be seen with early ileus or enteritis. No bowel obstruction. Stomach mildly distended with fluid and air with orogastric tube tip in stomach. 4. No periappendiceal region inflammation. No abscess in the abdomen or pelvis. 5. Prominent liver without focal lesions seen on this noncontrast enhanced study. Gallbladder mildly distended without evident wall thickening. Electronically Signed   By: Bretta Bang III M.D.   On: 02/22/2017 18:00   US Pelvis Complete  Result Date: 02/22/2017 CLINICAL DATA:  Possible abscess on CT history of left fallopian tube removal EXAM: TRANSABDOMINAL ULTRASOUND OF PELVIS DOPPLER ULTRASOUND OF OVARIES TECHNIQUE: Transabdominal ultrasound examination of the pelvis was performed including evaluation of the uterus, ovaries, adnexal regions, and pelvic cul-de-sac. Color and duplex Doppler ultrasound was utilized to evaluate blood flow to the ovaries. COMPARISON:  CT 02/22/2017 FINDINGS: Uterus Measurements: 5.7 x 3.1 x 4.9 cm. No fibroids or other mass visualized. Endometrium Thickness: 3.6 mm. No focal abnormality visualized. Right ovary Nonvisualized Left ovary Measurements: 4.3 x 3.3 x 2.7 cm. Hypoechoic cyst measuring 1.9 x 1.8 x 2.1 cm Pulsed Doppler evaluation demonstrates normal low-resistance arterial and venous waveforms in the left ovary. IMPRESSION: 1. Nonvisualized right ovary  2. Negative for left ovarian torsion. 2.1 cm hypoechoic cyst in the left ovary. Electronically Signed   By: Jasmine Pang M.D.   On: 02/22/2017 22:09   [place] Art/ven Flow Abd Pelv Doppler  Result Date: 02/22/2017 CLINICAL DATA:  Possible abscess on CT history of left fallopian tube removal EXAM: TRANSABDOMINAL ULTRASOUND OF PELVIS DOPPLER ULTRASOUND OF OVARIES TECHNIQUE: Transabdominal ultrasound examination of the pelvis was performed including evaluation of the uterus, ovaries, adnexal regions, and pelvic cul-de-sac. Color and duplex  Doppler ultrasound was utilized to evaluate blood flow to the ovaries. COMPARISON:  CT 02/22/2017 FINDINGS: Uterus Measurements: 5.7 x 3.1 x 4.9 cm. No fibroids or other mass visualized. Endometrium Thickness: 3.6 mm. No focal abnormality visualized. Right ovary Nonvisualized Left ovary Measurements: 4.3 x 3.3 x 2.7 cm. Hypoechoic cyst measuring 1.9 x 1.8 x 2.1 cm Pulsed Doppler evaluation demonstrates normal low-resistance arterial and venous waveforms in the left ovary. IMPRESSION: 1. Nonvisualized right ovary 2. Negative for left ovarian torsion. 2.1 cm hypoechoic cyst in the left ovary. Electronically Signed   By: Jasmine Pang M.D.   On: 02/22/2017 22:09   Dg Chest Port 1 View  Result Date: 02/23/2017 CLINICAL DATA:  ARDS EXAM: PORTABLE CHEST 1 VIEW COMPARISON:  Yesterday FINDINGS: Diffuse airspace opacity correlating with history of ARDS. Opacity is mildly improved from yesterday. There is a porta catheter on the right with low tip that crossed tricuspid valve on prior CT. Cardiomegaly accentuated by technique. Endotracheal tube tip between the clavicular heads and carina. An orogastric tube reaches the stomach. EKG leads create excessive artifact over the chest. No visible pneumothorax. IMPRESSION: 1. ARDS with mildly improved aeration.  No visible air leak. 2. Stable positioning of tubes and central line. Note that the right IJ catheter reaches the tricuspid valve on prior CT, recommend evaluation after convalescence. Electronically Signed   By: Marnee Spring M.D.   On: 02/23/2017 07:26   Dg Chest Portable 1 View  Result Date: 02/22/2017 CLINICAL DATA:  Hypoxia EXAM: PORTABLE CHEST 1 VIEW COMPARISON:  Study obtained earlier in the day FINDINGS: Endotracheal tube tip is 2.7 cm above the carina. Orogastric tube tip and side port are in the stomach. Port-A-Cath tip is in the right atrium near the tricuspid valve. No pneumothorax. There is diffuse interstitial and alveolar edema bilaterally. Heart is  mildly enlarged with pulmonary venous hypertension. No adenopathy. No bone lesions. IMPRESSION: Tube and catheter positions as described without pneumothorax. Widespread interstitial and alveolar opacity, likely edema. There may be a degree of congestive heart failure; pneumonia and/or ARDS may present in this manner. More than one of these entities may exist concurrently. Lungs and cardiac silhouette appear stable compared to earlier in the day. Electronically Signed   By: Bretta Bang III M.D.   On: 02/22/2017 15:27   Dg Chest Portable 1 View  Result Date: 02/22/2017 CLINICAL DATA:  Weakness with tremors and confusion. EXAM: PORTABLE CHEST 1 VIEW COMPARISON:  07/11/2016 FINDINGS: 1436 hours. The cardio pericardial silhouette is enlarged. Diffuse interstitial and airspace disease is new in the interval. A right-sided Port-A-Cath noted with the tip at her potentially through the tricuspid valve into the right ventricle. The visualized bony structures of the thorax are intact. Telemetry leads overlie the chest. Multiple surgical clips are seen over the upper abdomen IMPRESSION: 1. Cardiomegaly with diffuse interstitial and alveolar opacity suggests pulmonary edema. Diffuse infection could have this appearance. 2. Right Port-A-Cath tip is at or potentially through the tricuspid valve. Electronically Signed  By: Kennith Center M.D.   On: 02/22/2017 14:55     STUDIES:  CT abdomen/pelvis 8/17 >>>Status post nephrectomies bilaterally with transplant kidney in left upper to mid pelvis. There is soft tissue stranding in the perirenal region on the left raising concern for a degree of pyelonephritis on the left in the transplant kidney. No renal mass or abscess seen in this area. No hydronephrosis. No left transplant renal or ureteral calculus.  2. Right ovary absent. Left ovary mildly prominent without mass. There is thickening of soft tissue in the adnexal/fallopian tube region on the left with mild  soft tissue stranding. This finding potentially could indicate a degree of inflammation with potential for developing tubo-ovarian abscess on the left. Well-defined abscess not seen by CT, however. Early torsion conceivably could present in this manner involving the left ovary. This finding may warrant pelvic ultrasound to further evaluate in this regard. CT head 8/17 >>>neg acute CT chest>>>bilateral infiltrates US pelvis 8/17>>>Nonvisualized right ovary 2. Negative for left ovarian torsion. 2.1 cm hypoechoic cyst in the left ovary  CULTURES: Blood 8/17 >>> Urine 8/17 >>> Tracheal aspirate 8/17 >>>  ANTIBIOTICS: Imipenem 8/17 >>> Levofloxacin 8/17 >>> Vancomycin 8/17 >>> Eraxis 8/17 >>>  SIGNIFICANT EVENTS: 8/17 admit for ARDS, sepsis  LINES/TUBES: Port a cath R chest double lumen 8/17 >>> 8/17 ett>>>  DISCUSSION: 31 year old female with history of renal transplant and recent salpingoophrectomy admitted 8/17 for ARDS and apparent sepsis.    ASSESSMENT / PLAN:  PULMONARY A: ARDS  P:   ARDS protocol cvp if able To 40% as able, likely  Keep rate 35 Permissive hypercapnia, ensure 6-7 cc/kg Ensure plat less 30 abg repeat this am   CARDIOVASCULAR A:  HLD sepsis P:  Telemetry monitoring Holding pravastatin Pos balance May need cvp  RENAL A:   AKI on CKD, atn, arf Hyperkalemia S/p renal transplant x 2 NONAG P:   Bicarb drip for NONAG and K Follow BMP q8h Foley cath Holding lasix, may require Correct acidosis as able Joyce Gross, bicarb, insulin stat Renal consult May need cvvhd  GASTROINTESTINAL/GENITOURINARY  A:   Retroperitoneal fibrosis S/p Left salpingoophrectomy 01/2017  P:   NPO, start T PPI CT abdomen/pelvis reviewed, will call [facility], for transfer if unable, will need GYN assessment  HEMATOLOGIC A:   Factor V Leiden  P:  Check coags - wnl Subcutaneous heparin  INFECTIOUS A:   Suspected sepsis: etiology unclear. Potentially  intraabdominal source. ARDS vs HCAP. History of frequent UTI as well.  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 Tyson Alias, MD, FACP Pgr: [phone] [facility] 02/23/2017 7:45 AM   D/w GYN, reviewed CT , not discrete enough inflammation for IR recommendation D./w [facility] ICU doc, they want to move her She is now having seizures, stat eeg, neuro, ativan, keppra May need repeat imaging, this likely is metabolic Ccm time 90 min

## 2017-02-23 NOTE — Consult Note (Signed)
Neurology Consultation Reason for Consult: Seizure like activity Referring Physician: DrDan Tyson Alias  CC: Seizures  HPI: Donna Shannon is a 31 y.o. female with Factor V Leiden, Kidney disease s/p renal transplant x2, retroperitoneal fibrosis, self catheterizes bladder, and CVA admitted for ARDS and sepsis following recent salpingoophrectomy.  She was seen this morning having posturing movements and jerks and received Ativan 5mg .  Neurology was consulted. Patient found to have frequent myoclonic jerks and body shaking movementsm not rhythmic. She followed simple commands during these events. Ph was 7.09 during this.   ROS: A 14 point ROS unable  to perform due to poor mental status    Past Medical History:  Diagnosis Date  . Anemia   . Anxiety   . Arthritis    "left knee" (04/24/2016)  . Back pain   . Chronic lower back pain   . Constipation   . Depression   . Edema   . Essential hypertension   . Factor V Leiden (HCC)    [name] 04/24/2016  . Frequent UTI    [name] 04/24/2016  . GERD (gastroesophageal reflux disease)   . Gout   . High cholesterol   . HTN (hypertension)   . Kidney disease   . Migraine    "weekly" (04/24/2016)  . Neurogenic bladder    augmented bladder, I/O self caths  . On home oxygen therapy    "2L; every night" (04/24/2016)  . Osteoporosis   . Ovarian cyst dx'd 04/22/2016   left  . Palpitations   . Pneumonia 03/2016   [name] 04/24/2016  . Renal transplant failure and rejection    age 21-11  . 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 Seizures (HCC)   . Self-catheterizes urinary bladder    "~ 12 X/day" (04/24/2016)  . SOB (shortness of breath)   . Stroke [facility]) 1991   denies residual on 04/24/2016  . Vitamin D deficiency      Family History  Problem Relation Age of Onset  . Hypertension Mother   . CAD Father        s/p CABG  . Hyperlipidemia Father   . Heart disease Father   .  Thyroid disease Father      Social History:  reports that she quit smoking about 2 years ago. Her smoking use included Cigarettes. She has a 2.00 pack-year smoking history. She has never used smokeless tobacco. She reports that she drinks alcohol. She reports that she does not use drugs.   Exam: Current vital signs: BP 118/89   Pulse 83   Temp 97.9 F (36.6 C)   Resp (!) 31   Ht 5\' 2"  (1.575 m)   Wt 70.9 kg (156 lb 4.9 oz)   SpO2 98%   BMI 28.59 kg/m  Vital signs in last 24 hours: Temp:  [97.9 F (36.6 C)-98.3 F (36.8 C)] 97.9 F (36.6 C) (08/18 1326) Pulse Rate:  [81-107] 83 (08/18 1330) Resp:  [17-35] 31 (08/18 1330) BP: (81-119)/(52-91) 118/89 (08/18 1330) SpO2:  [93 %-100 %] 98 % (08/18 1330) FiO2 (%):  [40 %-50 %] 40 % (08/18 1340) Weight:  [70.9 kg (156 lb 4.9 oz)-77.6 kg (171 lb 1.2 oz)] 70.9 kg (156 lb 4.9 oz) (08/18 0500)   Physical Exam  General: intubated, appeared in distress CVS: pulse-normal rate and rhythm RS: not in respiratory distress Extremities: normal  Psych; unable to assess,   Neuro: MS: Awake, intermittently following commands CN:  pupils equal and reactive,  EOMI, face symmetric, tongue midline,  Motor: moving all 4 extremities minimally to command, frequent mild myoclonic jerks. shivering Reflexes: 2+ bilaterally over patella, biceps, plantars: flexor Coordination: unable to assess Gait: not tested  Basic Metabolic Panel:  Recent Labs Lab 02/22/17 1430 02/22/17 2156 02/23/17 0925  NA 133* 133* 133*  K 5.8* 6.0* 5.6*  CL 97* 104 104  CO2 23 17* 17*  GLUCOSE 93 108* 113*  BUN 62* 58* 62*  CREATININE 4.61* 4.17* 4.14*  CALCIUM 9.2 8.1* 8.2*  MG  --  2.2 2.5*  PHOS  --  7.1* 7.1*    CBC:  Recent Labs Lab 02/22/17 2156 02/23/17 0633  WBC 10.6* 10.3  NEUTROABS 8.8*  --   HGB 7.3* 6.7*  HCT 24.9* 23.1*  MCV 94.7 97.1  PLT 257 267     Coagulation Studies:  Recent Labs  02/22/17 1430  LABPROT 14.9  INR 1.17     Imaging Reviewed: Ct head   ASSESSMENT AND PLAN  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   Recommend keppra 1g load  which can help reduce  Myoclonus Stat EEG showed no seizures  CT head shows no acute abnormality Ativan PRN for tonic clonic rhythmic jerking   Will continue to follow.    Georgiana Spinner Dilpreet Faires MD [facility] [phone]  If 7pm to 7am, please call on call as listed on AMION.

## 2017-02-23 NOTE — Consult Note (Signed)
Reason for Consult: AKI/CKD Referring Physician: Titus Mould, MD  Donna Shannon is an 31 y.o. female.  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 and was seen on 02/18/17 and noted to have some small amount of drainage from her wound.  Her mother reports that she had not been feeling well over the past 4 days and was acting confused.  She also had fever of 102 and gradually became unresponsive in the ED and hypotensive with SBP of 88.  Pt was intubated and transferred to ICU and started on broad spectrum antibiotics.  We were consulted to further evaluate her AKI/CKD as well as hyperkalemia and possible need for CVVHD.  The trend in Scr is seen below.  Trend in Creatinine: Creatinine, Ser  Date/Time Value Ref Range Status  02/22/2017 09:56 PM 4.17 (H) 0.44 - 1.00 mg/dL Final  02/22/2017 02:30 PM 4.61 (H) 0.44 - 1.00 mg/dL Final  08/22/2016 02:40 PM 2.37 (H) 0.57 - 1.00 mg/dL Final  07/15/2016 03:02 AM 2.59 (H) 0.44 - 1.00 mg/dL Final  07/14/2016 07:20 AM 2.76 (H) 0.44 - 1.00 mg/dL Final  07/13/2016 05:53 AM 2.84 (H) 0.44 - 1.00 mg/dL Final  07/12/2016 07:04 AM 2.27 (H) 0.44 - 1.00 mg/dL Final  07/11/2016 07:13 PM 2.40 (H) 0.44 - 1.00 mg/dL Final  07/11/2016 06:46 PM 2.28 (H) 0.44 - 1.00 mg/dL Final  06/21/2016 04:23 PM 1.98 (H) 0.57 - 1.00 mg/dL Final  04/30/2016 05:00 AM 2.24 (H) 0.44 - 1.00 mg/dL Final  04/29/2016 12:32 PM 2.02 (H) 0.44 - 1.00 mg/dL Final  04/29/2016 09:40 AM 2.12 (H) 0.44 - 1.00 mg/dL Final  04/28/2016 05:40 AM 2.63 (H) 0.44 - 1.00 mg/dL Final  04/27/2016 11:25 AM 3.08 (H) 0.44 - 1.00 mg/dL Final  04/26/2016 05:45 AM 2.86 (H) 0.44 - 1.00 mg/dL Final  04/25/2016 01:57 AM 2.22 (H) 0.44 - 1.00 mg/dL Final  05/14/2008 10:11 AM 1.30 (H)  Final     PMH:   Past Medical History:  Diagnosis Date  . Anemia   . Anxiety   . Arthritis    "left knee" (04/24/2016)  . Back pain   . Chronic lower back pain   . Constipation   . Depression   . Edema   . Essential hypertension   . Factor V Leiden ([place])    Archie Endo 04/24/2016  . Frequent UTI    Archie Endo 04/24/2016  . GERD (gastroesophageal reflux disease)   . Gout   . High cholesterol   . HTN (hypertension)   . Kidney disease   . Migraine    "weekly" (04/24/2016)  . Neurogenic bladder    augmented bladder, I/O self caths  . On home oxygen therapy    "2L; every night" (04/24/2016)  . Osteoporosis   . Ovarian cyst dx'd 04/22/2016   left  . Palpitations   . Pneumonia 03/2016   Archie Endo 04/24/2016  . Renal transplant failure and rejection    age 25-11  . 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 Self-catheterizes urinary bladder    "~ 12 X/day" (04/24/2016)  . SOB (shortness of breath)   . Stroke [facility]) 1991   denies residual on 04/24/2016  . Vitamin D deficiency     PSH:   Past  Surgical History:  Procedure Laterality Date  . ABDOMINAL HERNIA REPAIR  "several; when I was little"  . allograph biopsy  2013   Archie Endo 04/24/2016  . HERNIA REPAIR    . [place]; 1998   father was donor; mother was donor/notes 04/24/2016  . PACEMAKER INSERTION  "?early 2000s"   for bladder function  . PARTIAL NEPHRECTOMY  1990s X 5   "removing disease"  . PORTACATH PLACEMENT Right 11/2015  . RIGHT OOPHORECTOMY Right 2001   ovarian torsion    Allergies:  Allergies  Allergen Reactions  . Sulfa Antibiotics Itching    Medications:   Prior to Admission medications   Medication Sig Start Date End Date Taking? Authorizing Provider  acetaminophen-codeine (TYLENOL #4) 300-60 MG tablet Take 1 tablet by mouth every 6 (six) hours as needed for pain.    [provider]  acetaZOLAMIDE (DIAMOX) 250 MG tablet Take 2 tablets  (500 mg total) by mouth 2 (two) times daily. Patient taking differently: Take 500 mg by mouth daily.  10/22/16 11/21/16  Melvenia Beam, MD  aspirin 81 MG chewable tablet Chew 81 mg by mouth daily.    [provider]  Cholecalciferol (VITAMIN D-3) 1000 units CAPS Take 1 capsule (1,000 Units total) by mouth daily. 01/22/17   Dennard Nip D, MD  ciprofloxacin (CIPRO) 500 MG tablet Take 500 mg by mouth 2 (two) times daily.    [provider]  clonazePAM (KLONOPIN) 0.5 MG tablet Take 0.5 mg by mouth See admin instructions. Take 2 tablets in the morning, then take one in the afternoon, then take 2 tablets at night per patient    [provider]  cycloSPORINE (SANDIMMUNE) 100 MG capsule Take 100 mg by mouth 2 (two) times daily.    [provider]  cycloSPORINE (SANDIMMUNE) 25 MG capsule Take 25 mg by mouth every morning.    [provider]  escitalopram (LEXAPRO) 20 MG tablet Take 1 tablet (20 mg total) by mouth daily. 01/22/17   Dennard Nip D, MD  fenofibrate 160 MG tablet Take 160 mg by mouth daily.    [provider]  flavoxATE (URISPAS) 100 MG tablet Take 100 mg by mouth 3 (three) times daily. Take every day per patient    [provider]  furosemide (LASIX) 40 MG tablet Take 1 tablet (40 mg total) by mouth 2 (two) times daily. Patient taking differently: Take 20 mg by mouth daily.  07/15/16 08/14/16  Reyne Dumas, MD  gabapentin (NEURONTIN) 300 MG capsule Take 300 mg by mouth daily.     [provider]  magnesium oxide (MAG-OX) 400 MG tablet Take 400 mg by mouth daily.     [provider]  methylphenidate (RITALIN) 10 MG tablet Take 10 mg by mouth 2 (two) times daily.    [provider]  metoprolol (LOPRESSOR) 50 MG tablet Take 1 tablet (50 mg total) by mouth 2 (two) times daily. Patient taking differently: Take 50 mg by mouth daily.  04/30/16   Barton Dubois, MD  mirabegron ER (MYRBETRIQ) 50 MG TB24 tablet  Take 50 mg by mouth at bedtime.    [provider]  mycophenolate (CELLCEPT) 500 MG tablet Take 500 mg by mouth 2 (two) times daily.    [provider]  omega-3 acid ethyl esters (LOVAZA) 1 g capsule Take 1 g by mouth 3 (three) times daily.    [provider]  ondansetron (ZOFRAN) 8 MG tablet Take 0.5 tablets (4 mg total)  by mouth every 8 (eight) hours as needed for nausea or vomiting. 07/15/16   Reyne Dumas, MD  pravastatin (PRAVACHOL) 40 MG tablet Take 40 mg by mouth at bedtime.    [provider]  predniSONE (DELTASONE) 2.5 MG tablet Take 2.5 mg by mouth daily with breakfast.    [provider]  promethazine (PHENERGAN) 25 MG suppository INSERT 1 SUPPOSITORY RECTALLY EVERY 6 HOURS AS NEEDED FOR NAUSEA OR VOMITING 01/30/17   Melvenia Beam, MD  rizatriptan (MAXALT) 10 MG tablet Take 1 tablet (10 mg total) by mouth daily as needed for migraine. May repeat in 2 hours if needed 01/25/17   Melvenia Beam, MD  sertraline (ZOLOFT) 100 MG tablet Take 100 mg by mouth 2 (two) times daily. 1/2 pill in the AM, one pill in the PM    [provider]  traMADol (ULTRAM) 50 MG tablet Take 50 mg by mouth 2 (two) times daily.     [provider]    Inpatient medications: . chlorhexidine gluconate (MEDLINE KIT)  15 mL Mouth Rinse BID  . dextrose  1 ampule Intravenous Once  . feeding supplement (PRO-STAT SUGAR FREE 64)  30 mL Per Tube BID  . feeding supplement (VITAL HIGH PROTEIN)  1,000 mL Per Tube Q24H  . heparin  5,000 Units Subcutaneous Q8H  . hydrocortisone sod succinate (SOLU-CORTEF) inj  50 mg Intravenous Q6H  . insulin aspart  10 Units Intravenous Once  . mouth rinse  15 mL Mouth Rinse QID  . pantoprazole (PROTONIX) IV  40 mg Intravenous QHS  . sodium polystyrene  15 g Oral Once    Discontinued Meds:   Medications Discontinued During This Encounter  Medication Reason  . fentaNYL (SUBLIMAZE) injection 100 mcg   . fentaNYL (SUBLIMAZE)  injection 100 mcg   . norepinephrine (LEVOPHED) 4 mg in dextrose 5 % 250 mL (0.016 mg/mL) infusion   . phenylephrine 0.4-0.9 MG/10ML-% injection Returned to [facility]S    Social History:  reports that she quit smoking about 2 years ago. Her smoking use included Cigarettes. She has a 2.00 pack-year smoking history. She has never used smokeless tobacco. She reports that she drinks alcohol. She reports that she does not use drugs.  Family History:   Family History  Problem Relation Age of Onset  . Hypertension Mother   . CAD Father        s/p CABG  . Hyperlipidemia Father   . Heart disease Father   . Thyroid disease Father     Review of systems not obtained due to patient factors. Weight change:   Intake/Output Summary (Last 24 hours) at 02/23/17 0815 Last data filed at 02/23/17 5110  Gross per 24 hour  Intake          2946.13 ml  Output              145 ml  Net          2801.13 ml   BP (!) 89/56   Pulse 87   Temp 98.2 F (36.8 C) (Oral)   Resp 17   Ht _0  (1.575 m)   Wt 70.9 kg (156 lb 4.9 oz)   SpO2 98%   BMI 28.59 kg/m  Vitals:   02/23/17 0500 02/23/17 0600 02/23/17 0700 02/23/17 0805  BP: (!) 88/58 (!) 92/56 (!) 89/56   Pulse: 86 86 87   Resp:   17   Temp:    98.2 F (36.8 C)  TempSrc:  Oral  SpO2: 98% 98% 98%   Weight: 70.9 kg (156 lb 4.9 oz)     Height:         General appearance: Intubated and actively seizing Head: Normocephalic, without obvious abnormality, atraumatic Eyes: negative findings: lids and lashes normal, conjunctivae and sclerae normal and corneas clear Resp: clear to auscultation bilaterally Cardio: regular rate and rhythm and no rub GI: ventral incision with area of dehiscence and drainage Extremities: extremities normal, atraumatic, no cyanosis or edema NERUO: actively seizing  Labs: Basic Metabolic Panel:  Recent Labs Lab 02/22/17 1430 02/22/17 2156  NA 133* 133*  K 5.8* 6.0*  CL 97* 104  CO2 23 17*  GLUCOSE 93 108*  BUN 62*  58*  CREATININE 4.61* 4.17*  ALBUMIN 2.4*  --   CALCIUM 9.2 8.1*  PHOS  --  7.1*   Liver Function Tests:  Recent Labs Lab 02/22/17 1430  AST 32  ALT 13*  ALKPHOS 94  BILITOT 1.3*  PROT 6.4*  ALBUMIN 2.4*   No results for input(s): LIPASE, AMYLASE in the last 168 hours.  Recent Labs Lab 02/22/17 1608  AMMONIA 44*   CBC:  Recent Labs Lab 02/22/17 2156 02/23/17 0633  WBC 10.6* 10.3  NEUTROABS 8.8*  --   HGB 7.3* 6.7*  HCT 24.9* 23.1*  MCV 94.7 97.1  PLT 257 267   PT/INR: _0 (inr:5) Cardiac Enzymes: )No results for input(s): CKTOTAL, CKMB, CKMBINDEX, TROPONINI in the last 168 hours. CBG:  Recent Labs Lab 02/22/17 1754 02/22/17 2033 02/23/17 0016 02/23/17 0416 02/23/17 0808  GLUCAP 106* 90 105* 99 112*    Iron Studies: No results for input(s): IRON, TIBC, TRANSFERRIN, FERRITIN in the last 168 hours.  Xrays/Other Studies: Ct Abdomen Pelvis Wo Contrast  Result Date: 02/22/2017 CLINICAL DATA:  Fevers.  Prior renal cell transplant. EXAM: CT CHEST, ABDOMEN AND PELVIS WITHOUT CONTRAST TECHNIQUE: Multidetector CT imaging of the chest, abdomen and pelvis was performed following the standard protocol without IV contrast. COMPARISON:  Chest radiograph February 22, 2017 ; CT abdomen and pelvis April 28, 2016 FINDINGS: CT CHEST FINDINGS Cardiovascular: There is no thoracic aortic aneurysm. Visualized great vessels appear unremarkable. There is no appreciable pericardial effusion. There is a central catheter with the tip just beyond the tricuspid valve in the right ventricle. Mediastinum/Nodes: Visualized thyroid appears unremarkable. There is no appreciable thoracic adenopathy. Lungs/Pleura: Endotracheal tube tip is in the mid tracheal region. There is widespread interstitial and alveolar opacity throughout the lungs bilaterally. There is a fairly small right pleural effusion with a rather minimal left pleural effusion. Musculoskeletal: No blastic or lytic bone  lesions. CT ABDOMEN PELVIS FINDINGS Hepatobiliary: Liver measures 21.0 cm in length. No focal liver lesions are evident on this noncontrast enhanced study. Gallbladder is mildly distended without wall thickening evident. No appreciable biliary duct dilatation. Pancreas: No pancreatic mass or inflammatory focus. Spleen: No splenic lesions are evident. Adrenals/Urinary Tract: Adrenals appear unremarkable bilaterally. The patient has had surgical removal of native kidneys bilaterally. There is a transplant kidney in the left upper to mid pelvis. There is no transplant kidney mass or hydronephrosis. There is stranding in the pre nephric soft tissues of the left abdomen in the region of the kidney. There is no renal or ureteral calculus evident. Urinary bladder is midline with wall thickness within normal limits. Stomach/Bowel: There is mild distention of the rectum with stool and air. Most small bowel loops are filled with fluid. Stomach is filled with fluid material and air with slight  gastric distention. Nasogastric tube tip is in the stomach. There is no appreciable bowel wall thickening. No bowel obstruction. No free air or portal venous air evident. Vascular/Lymphatic: There is no abdominal aortic aneurysm. No vascular lesions are evident on this noncontrast enhanced study. There is no appreciable adenopathy in the abdomen or pelvis. Reproductive: Uterus is overall normal in size and contour. Right ovary is not seen and by report is absent. Left ovary appears mildly prominent. There is soft tissue thickening in the left adnexal region with mild soft tissue stranding in this area. A well-defined mass is not seen in this area. Other: Appendix not seen. There is no periappendiceal region inflammation. There is no abscess or ascites in the abdomen or pelvis. There is scarring in the anterior abdominal wall. There are surgical clips throughout the abdomen and pelvis, also present on prior study. Musculoskeletal: There  are no blastic or lytic bone lesions. There is no intramuscular lesion. IMPRESSION: CT chest: 1. Widespread interstitial and alveolar opacity with small pleural effusions, larger on the right than on the left. Heart is prominent without pericardial effusion. The appearance suggest congestive heart failure. There may well be superimposed pneumonia and/ or ARDS. No pneumothorax evident. 2.  No evident adenopathy. 3. Central catheter tip just beyond tricuspid valve in the right ventricle. CT abdomen and pelvis: 1. Status post nephrectomies bilaterally with transplant kidney in left upper to mid pelvis. There is soft tissue stranding in the perirenal region on the left raising concern for a degree of pyelonephritis on the left in the transplant kidney. No renal mass or abscess seen in this area. No hydronephrosis. No left transplant renal or ureteral calculus. 2. Right ovary absent. Left ovary mildly prominent without mass. There is thickening of soft tissue in the adnexal/fallopian tube region on the left with mild soft tissue stranding. This finding potentially could indicate a degree of inflammation with potential for developing tubo-ovarian abscess on the left. Well-defined abscess not seen by CT, however. Early torsion conceivably could present in this manner involving the left ovary. This finding may warrant pelvic ultrasound to further evaluate in this regard. 3. Most small bowel loops are fluid-filled. This finding may be seen normally but also may be seen with early ileus or enteritis. No bowel obstruction. Stomach mildly distended with fluid and air with orogastric tube tip in stomach. 4. No periappendiceal region inflammation. No abscess in the abdomen or pelvis. 5. Prominent liver without focal lesions seen on this noncontrast enhanced study. Gallbladder mildly distended without evident wall thickening. Electronically Signed   By: Lowella Grip III M.D.   On: 02/22/2017 18:00   Ct Head Wo  Contrast  Result Date: 02/22/2017 CLINICAL DATA:  Unresponsive.  Recent confusion and syncope EXAM: CT HEAD WITHOUT CONTRAST TECHNIQUE: Contiguous axial images were obtained from the base of the skull through the vertex without intravenous contrast. COMPARISON:  July 11, 2016 FINDINGS: Brain: The ventricles are normal in size and configuration. There is no intracranial mass, hemorrhage, extra-axial fluid collection, or midline shift. Gray-white compartments are normal. No evident acute infarct. Vascular: No hyperdense vessel. There is no appreciable no vascular calcification. Skull: The bony calvarium appears intact. Sinuses/Orbits: Patient is intubated. There is mucosal thickening in multiple ethmoid air cells bilaterally. There is mild mucosal thickening in the inferior maxillary antra bilaterally. Paranasal sinuses elsewhere clear. Orbits appear symmetric bilaterally. Other: Mastoid air cells are clear. Proximal orogastric tube may be coiled in the patient's mouth/pharynx. IMPRESSION: Areas of paranasal sinus disease,  likely exacerbated by intubation. Proximal orogastric tube appears to be coiled in the mouth and upper pharynx. No intracranial mass, hemorrhage, or extra-axial fluid collection. Gray-white compartments appear normal. Electronically Signed   By: Lowella Grip III M.D.   On: 02/22/2017 17:44   Ct Chest Wo Contrast  Result Date: 02/22/2017 CLINICAL DATA:  Fevers.  Prior renal cell transplant. EXAM: CT CHEST, ABDOMEN AND PELVIS WITHOUT CONTRAST TECHNIQUE: Multidetector CT imaging of the chest, abdomen and pelvis was performed following the standard protocol without IV contrast. COMPARISON:  Chest radiograph February 22, 2017 ; CT abdomen and pelvis April 28, 2016 FINDINGS: CT CHEST FINDINGS Cardiovascular: There is no thoracic aortic aneurysm. Visualized great vessels appear unremarkable. There is no appreciable pericardial effusion. There is a central catheter with the tip just beyond the  tricuspid valve in the right ventricle. Mediastinum/Nodes: Visualized thyroid appears unremarkable. There is no appreciable thoracic adenopathy. Lungs/Pleura: Endotracheal tube tip is in the mid tracheal region. There is widespread interstitial and alveolar opacity throughout the lungs bilaterally. There is a fairly small right pleural effusion with a rather minimal left pleural effusion. Musculoskeletal: No blastic or lytic bone lesions. CT ABDOMEN PELVIS FINDINGS Hepatobiliary: Liver measures 21.0 cm in length. No focal liver lesions are evident on this noncontrast enhanced study. Gallbladder is mildly distended without wall thickening evident. No appreciable biliary duct dilatation. Pancreas: No pancreatic mass or inflammatory focus. Spleen: No splenic lesions are evident. Adrenals/Urinary Tract: Adrenals appear unremarkable bilaterally. The patient has had surgical removal of native kidneys bilaterally. There is a transplant kidney in the left upper to mid pelvis. There is no transplant kidney mass or hydronephrosis. There is stranding in the pre nephric soft tissues of the left abdomen in the region of the kidney. There is no renal or ureteral calculus evident. Urinary bladder is midline with wall thickness within normal limits. Stomach/Bowel: There is mild distention of the rectum with stool and air. Most small bowel loops are filled with fluid. Stomach is filled with fluid material and air with slight gastric distention. Nasogastric tube tip is in the stomach. There is no appreciable bowel wall thickening. No bowel obstruction. No free air or portal venous air evident. Vascular/Lymphatic: There is no abdominal aortic aneurysm. No vascular lesions are evident on this noncontrast enhanced study. There is no appreciable adenopathy in the abdomen or pelvis. Reproductive: Uterus is overall normal in size and contour. Right ovary is not seen and by report is absent. Left ovary appears mildly prominent. There is  soft tissue thickening in the left adnexal region with mild soft tissue stranding in this area. A well-defined mass is not seen in this area. Other: Appendix not seen. There is no periappendiceal region inflammation. There is no abscess or ascites in the abdomen or pelvis. There is scarring in the anterior abdominal wall. There are surgical clips throughout the abdomen and pelvis, also present on prior study. Musculoskeletal: There are no blastic or lytic bone lesions. There is no intramuscular lesion. IMPRESSION: CT chest: 1. Widespread interstitial and alveolar opacity with small pleural effusions, larger on the right than on the left. Heart is prominent without pericardial effusion. The appearance suggest congestive heart failure. There may well be superimposed pneumonia and/ or ARDS. No pneumothorax evident. 2.  No evident adenopathy. 3. Central catheter tip just beyond tricuspid valve in the right ventricle. CT abdomen and pelvis: 1. Status post nephrectomies bilaterally with transplant kidney in left upper to mid pelvis. There is soft tissue stranding in the  perirenal region on the left raising concern for a degree of pyelonephritis on the left in the transplant kidney. No renal mass or abscess seen in this area. No hydronephrosis. No left transplant renal or ureteral calculus. 2. Right ovary absent. Left ovary mildly prominent without mass. There is thickening of soft tissue in the adnexal/fallopian tube region on the left with mild soft tissue stranding. This finding potentially could indicate a degree of inflammation with potential for developing tubo-ovarian abscess on the left. Well-defined abscess not seen by CT, however. Early torsion conceivably could present in this manner involving the left ovary. This finding may warrant pelvic ultrasound to further evaluate in this regard. 3. Most small bowel loops are fluid-filled. This finding may be seen normally but also may be seen with early ileus or  enteritis. No bowel obstruction. Stomach mildly distended with fluid and air with orogastric tube tip in stomach. 4. No periappendiceal region inflammation. No abscess in the abdomen or pelvis. 5. Prominent liver without focal lesions seen on this noncontrast enhanced study. Gallbladder mildly distended without evident wall thickening. Electronically Signed   By: Lowella Grip III M.D.   On: 02/22/2017 18:00   US Pelvis Complete  Result Date: 02/22/2017 CLINICAL DATA:  Possible abscess on CT history of left fallopian tube removal EXAM: TRANSABDOMINAL ULTRASOUND OF PELVIS DOPPLER ULTRASOUND OF OVARIES TECHNIQUE: Transabdominal ultrasound examination of the pelvis was performed including evaluation of the uterus, ovaries, adnexal regions, and pelvic cul-de-sac. Color and duplex Doppler ultrasound was utilized to evaluate blood flow to the ovaries. COMPARISON:  CT 02/22/2017 FINDINGS: Uterus Measurements: 5.7 x 3.1 x 4.9 cm. No fibroids or other mass visualized. Endometrium Thickness: 3.6 mm. No focal abnormality visualized. Right ovary Nonvisualized Left ovary Measurements: 4.3 x 3.3 x 2.7 cm. Hypoechoic cyst measuring 1.9 x 1.8 x 2.1 cm Pulsed Doppler evaluation demonstrates normal low-resistance arterial and venous waveforms in the left ovary. IMPRESSION: 1. Nonvisualized right ovary 2. Negative for left ovarian torsion. 2.1 cm hypoechoic cyst in the left ovary. Electronically Signed   By: Donavan Foil M.D.   On: 02/22/2017 22:09   [place] Art/ven Flow Abd Pelv Doppler  Result Date: 02/22/2017 CLINICAL DATA:  Possible abscess on CT history of left fallopian tube removal EXAM: TRANSABDOMINAL ULTRASOUND OF PELVIS DOPPLER ULTRASOUND OF OVARIES TECHNIQUE: Transabdominal ultrasound examination of the pelvis was performed including evaluation of the uterus, ovaries, adnexal regions, and pelvic cul-de-sac. Color and duplex Doppler ultrasound was utilized to evaluate blood flow to the ovaries. COMPARISON:  CT  02/22/2017 FINDINGS: Uterus Measurements: 5.7 x 3.1 x 4.9 cm. No fibroids or other mass visualized. Endometrium Thickness: 3.6 mm. No focal abnormality visualized. Right ovary Nonvisualized Left ovary Measurements: 4.3 x 3.3 x 2.7 cm. Hypoechoic cyst measuring 1.9 x 1.8 x 2.1 cm Pulsed Doppler evaluation demonstrates normal low-resistance arterial and venous waveforms in the left ovary. IMPRESSION: 1. Nonvisualized right ovary 2. Negative for left ovarian torsion. 2.1 cm hypoechoic cyst in the left ovary. Electronically Signed   By: Donavan Foil M.D.   On: 02/22/2017 22:09   Dg Chest Port 1 View  Result Date: 02/23/2017 CLINICAL DATA:  ARDS EXAM: PORTABLE CHEST 1 VIEW COMPARISON:  Yesterday FINDINGS: Diffuse airspace opacity correlating with history of ARDS. Opacity is mildly improved from yesterday. There is a porta catheter on the right with low tip that crossed tricuspid valve on prior CT. Cardiomegaly accentuated by technique. Endotracheal tube tip between the clavicular heads and carina. An orogastric tube reaches the stomach.  EKG leads create excessive artifact over the chest. No visible pneumothorax. IMPRESSION: 1. ARDS with mildly improved aeration.  No visible air leak. 2. Stable positioning of tubes and central line. Note that the right IJ catheter reaches the tricuspid valve on prior CT, recommend evaluation after convalescence. Electronically Signed   By: Monte Fantasia M.D.   On: 02/23/2017 07:26   Dg Chest Portable 1 View  Result Date: 02/22/2017 CLINICAL DATA:  Hypoxia EXAM: PORTABLE CHEST 1 VIEW COMPARISON:  Study obtained earlier in the day FINDINGS: Endotracheal tube tip is 2.7 cm above the carina. Orogastric tube tip and side port are in the stomach. Port-A-Cath tip is in the right atrium near the tricuspid valve. No pneumothorax. There is diffuse interstitial and alveolar edema bilaterally. Heart is mildly enlarged with pulmonary venous hypertension. No adenopathy. No bone lesions.  IMPRESSION: Tube and catheter positions as described without pneumothorax. Widespread interstitial and alveolar opacity, likely edema. There may be a degree of congestive heart failure; pneumonia and/or ARDS may present in this manner. More than one of these entities may exist concurrently. Lungs and cardiac silhouette appear stable compared to earlier in the day. Electronically Signed   By: Lowella Grip III M.D.   On: 02/22/2017 15:27   Dg Chest Portable 1 View  Result Date: 02/22/2017 CLINICAL DATA:  Weakness with tremors and confusion. EXAM: PORTABLE CHEST 1 VIEW COMPARISON:  07/11/2016 FINDINGS: 1436 hours. The cardio pericardial silhouette is enlarged. Diffuse interstitial and airspace disease is new in the interval. A right-sided Port-A-Cath noted with the tip at her potentially through the tricuspid valve into the right ventricle. The visualized bony structures of the thorax are intact. Telemetry leads overlie the chest. Multiple surgical clips are seen over the upper abdomen IMPRESSION: 1. Cardiomegaly with diffuse interstitial and alveolar opacity suggests pulmonary edema. Diffuse infection could have this appearance. 2. Right Port-A-Cath tip is at or potentially through the tricuspid valve. Electronically Signed   By: Misty Stanley M.D.   On: 02/22/2017 14:55     Assessment/Plan: 1.  AKI/CKD in setting of sepsis with ischemic ATN- agree with IVF's and supportive care for now.  Repeat labs pending. 2. Hyperkalemia- due to #1.  Agree with conservative management for now, however if her potassium does not improve will likely need to initiate CVVHD. 3. Seizures- given ativan and Neuro consulted, likely metabolic, however could be infectious, however too unstable for LP at this time 4. Metabolic acidosis- due to #1- bicarb IV and follow.  5. VDRF- per PCCM 6. Sepsis- possible intra-abdominal source given recent surgery and history of fibrosis and difficult abdomen.  Gynecology feels with  frozen abdomen cannot proceed with IR performing drainage attempt and will consult general surgery per PCCM. 7. ABLA- transfuse 1 unit and follow.    Broadus John A Zalayah Pizzuto 02/23/2017, 8:15 AM

## 2017-02-23 NOTE — Consult Note (Signed)
WOC Nurse wound consult note Reason for Consult:Nonhealing surgical wound to midline abdominal wound.  Mother is caring for her at home and packing this.  Wound type:Nonhealing surgical wound Pressure Injury POA: NA Measurement:2 sites, proximal site is dry and scabbed.  Distal site:  1 cm x 0.2 cm x 0.4 cm with purulence noted. Wound WER:[id number] noted, unable to visualize wound bed.  Drainage (amount, consistency, odor) Minimal purulent, tan drainage.  No odor.  Periwound:Scarring from healed incision, dehiscence at two sites.  Dressing procedure/placement/frequency:Cleanse open wound to midline abdomen with NS and pat gently dry.  Fill wound depth with Iodoform packing strip.  Cover with ABD pad and tape.  Change daily.  Will not follow at this time.  Please re-consult if needed.  Maple Hudson RN BSN CWON Pager [phone]

## 2017-02-23 NOTE — Discharge Summary (Signed)
Physician Discharge Summary  Patient ID: Donna Shannon MRN: [id number] DOB/AGE: 33-Aug-1987 33 y.o.  Admit date: 02/22/2017 Discharge date: 02/23/2017    Discharge Diagnoses:  Somnolence [R40.0] AKI (acute kidney injury) ([place]) [N17.9] Acute respiratory failure with hypoxia and hypercapnia ([place]) [J96.01, J96.02]  Sepsis  ARDS  Hypotension   Acute metabolic encephlopathy Factor V Leiden  Retroperitoneal Fibrosis  Hyperkalemia  Non Anion gap Metabolic Acidosis  S/p Renal Transplant x 2  Anemia                                                                       DISCHARGE PLAN BY DIAGNOSIS    Discharge Plan: PULMONARY A: ARDS- 8/18 w/ acute decline , Vt increased to 8cc during acute episode. Once arrives at [facility] can  decrease slowly back to 6 cc as able.  P:   ARDS protocol cvp if able Keep rate 35 Permissive hypercapnia, ensure 6-7 cc/kg as able  Ensure plat less 30 abg now   CARDIOVASCULAR A:  HLD Sepsis Hypotension /Septic Shock in immunosuppressed pt  >resolved pressors not started  P:  Pressors not started, on hold if b/p drops.  Telemetry monitoring Holding pravastatin Pos balance    RENAL A:   AKI on CKD, atn, arf Hyperkalemia S/p renal transplant x 2 NONAG P:   Bicarb drip for NONAG and K Follow BMP q8h Foley cath Holding lasix, may require Correct acidosis as able Donna Shannon, bicarb, insulin stat Renal following  May need cvvhd  GASTROINTESTINAL/GENITOURINARY  A:   Retroperitoneal fibrosis S/p Left salpingoophrectomy 01/2017  P:   NPO PPI CT abdomen/pelvis reviewed Surgery consult  Transfer to [facility] , will need Surgery and GYN consult   HEMATOLOGIC A:   Factor V Leiden Anemia   P:  Check coags - wnl Subcutaneous heparin 1u PRBC x 1   INFECTIOUS A:   Suspected sepsis: etiology unclear. Potentially intraabdominal source. ARDS vs HCAP. History of frequent UTI as well.  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   ENDOCRINE A:   Presumed AI P:   Stress streroids  Follow bs, add SSI as indicated.   NEUROLOGIC A:   Acute metabolic encephalopathy  Seizures -new onset 8/18 , EEG 8/18 mild to mod gen cerbral dysfunction, no seizures noted.  P:   RASS goal: -1  PRN fentanyl PRN versed Keppra started 8/18                       DISCHARGE SUMMARY  31 year old female with complex medical history as below, which is significant for Factor V Leiden, Kidney disease s/p renal transplant x2, retroperitoneal fibrosis, self catheterizes bladder, and CVA. She has a long history of abdominal surgeries, most recently she was admitted to [facility] in late July where she underwent left salpingoopherectomy 7/23 and was discharged two days later. Post op course uncomplicated until 2/63 when she developed fevers up to 102F. She then developed  confusion and 8/17 suffered a syncopal episode. EMS was called and noted her SpO2 to be 52%. She was transported from [place], [place] to [facility] for evaluation. Upon arrival to ED she was unresponsive and was intubated for airway protection. PCCM asked to admit 02/22/17 . She was placed on ARDS protocol . Started on empiric broad spectrum abx (eraxis, primaxin, levaquin, vanc) as with recent surgery /chronic immunosuppression . Renal function continued to decline with hyperkalemia on 8/18 . She had acute respiratory decline early 8/18 with new onset seizure activity . Vent setting were on ARDS protocol but changed during acute episode to 8cc/kg.  ABG rechecked at transfer w/ drop in pH at 7.27 , changed back to 8cc/kg.  Hbg trended down at 6.7. She was started on 1 u PRBC. CT abd  was discussed with GYN and Surgery with  not discrete enough inflammation for IR recommendation. EEG was done with no seizure activity noted. Mild to moderate gen cerebral dysfunction.  Started on Ativan and Keppra . Imipenem was stopped. Changed to Meropenem . Not started prior to transfer. She became hypotensive and pressors were ordered but did not need to start. Renal consult as pt renal function continued to decline with hyperkalemia. She required Kayexlate and Insulin x 1 dose early on 8/18. Prior to transfer K Improved to 5.6 . PH improved to 7.3. Dr. Titus Shannon with contact with [facility] ICU MD with acceptance of pt for transfer. [facility] to [facility] pt [facility] to ICU. Will need to reevaluate Vt on arrival and try to get back to ARDS protocol 6cc /kg as able.  Home meds including immunosuppression meds are on hold . Continue current ICU medication regimen .            SIGNIFICANT DIAGNOSTIC STUDIES CT abdomen/pelvis 8/17 >>>Status post nephrectomies bilaterally with transplant kidney in left upper to mid pelvis. There is soft tissue stranding in the perirenal region on the left raising concern for a degree of pyelonephritis on the left in the transplant kidney. No renal mass or abscess seen in this area. No hydronephrosis. No left transplant renal or ureteral calculus.  2. Right ovary absent. Left ovary mildly prominent without mass. There is thickening of soft tissue in the adnexal/fallopian tube region on the left with mild soft tissue stranding. This finding potentially could indicate a degree of inflammation with potential for developing tubo-ovarian abscess on the left. Well-defined abscess not seen by CT, however. Early torsion conceivably could present in this manner involving the left ovary. This finding may warrant pelvic ultrasound to further evaluate in this regard. CT head 8/17 >>>neg acute CT chest>>>bilateral infiltrates US pelvis 8/17>>>Nonvisualized right ovary 2.  Negative for left ovarian torsion. 2.1 cm hypoechoic cyst in the left ovary EEG 8/18 mild to mod gen cerbral dysfunction, no seizures noted.   MICRO DATA  Blood 8/17 >>> Urine 8/17 >>> Tracheal aspirate 8/17 >>>  ANTIBIOTICS Imipenem 8/17 >>>8/18 Levofloxacin 8/17 >>> Vancomycin 8/17 >>> Eraxis 8/17 >>>  CONSULTS Nephrology 02/23/17 Surgery 02/23/17   TUBES / LINES Port a cath R chest double lumen 8/17 >>> 8/17 ett>>>   Discharge Exam: General: rass -1 fc intermittently  Neuro: fc intermittently , rass -1   HEENT: jvd wnl to low PULM: reduced, ronchi CV:  s1 s2 RRR GI: soft, bs hypo, no r, wound pus noted on skin Extremities: gen edema  Vitals:   02/23/17 1300 02/23/17 1301 02/23/17 1326 02/23/17 1330  BP: (!) 119/91 119/90 (!) 119/91  118/89  Pulse: 81 84 85 83  Resp: (!) 28 (!) 35 (!) 35 (!) 31  Temp:   97.9 F (36.6 C)   TempSrc:      SpO2: 98% 99% 98% 98%  Weight:      Height:         Discharge Labs  BMET  Recent Labs Lab 02/22/17 1430 02/22/17 2156 02/23/17 0925  NA 133* 133* 133*  K 5.8* 6.0* 5.6*  CL 97* 104 104  CO2 23 17* 17*  GLUCOSE 93 108* 113*  BUN 62* 58* 62*  CREATININE 4.61* 4.17* 4.14*  CALCIUM 9.2 8.1* 8.2*  MG  --  2.2 2.5*  PHOS  --  7.1* 7.1*    CBC  Recent Labs Lab 02/22/17 2156 02/23/17 0633  HGB 7.3* 6.7*  HCT 24.9* 23.1*  WBC 10.6* 10.3  PLT 257 267    Anti-Coagulation  Recent Labs Lab 02/22/17 1430  INR 1.17    Discharge Instructions    Diet - low sodium heart healthy    Complete by:  As directed    Increase activity slowly    Complete by:  As directed        HOME MEDS:  No current facility-administered medications on file prior to encounter.    Current Outpatient Prescriptions on File Prior to Encounter  Medication Sig Dispense Refill  . aspirin 81 MG chewable tablet Chew 81 mg by mouth daily.    . Cholecalciferol (VITAMIN D-3) 1000 units CAPS Take 1 capsule (1,000 Units total) by mouth  daily. 30 capsule 0  . clonazePAM (KLONOPIN) 0.5 MG tablet Take 0.5-1 mg by mouth See admin instructions. Take 2 tablets (1 mg) by mouth every morning and at night, take 1 tablet (0.5 mg) every afternoon    . cycloSPORINE (SANDIMMUNE) 100 MG capsule Take 100 mg by mouth 2 (two) times daily.    . cycloSPORINE (SANDIMMUNE) 25 MG capsule Take 25 mg by mouth daily.     . fenofibrate 160 MG tablet Take 160 mg by mouth daily.    . flavoxATE (URISPAS) 100 MG tablet Take 100 mg by mouth 3 (three) times daily.     [name] gabapentin (NEURONTIN) 300 MG capsule Take 300 mg by mouth 2 (two) times daily.     . magnesium oxide (MAG-OX) 400 MG tablet Take 800 mg by mouth 2 (two) times daily.     . methylphenidate (RITALIN) 10 MG tablet Take 10 mg by mouth 2 (two) times daily.    . metoprolol (LOPRESSOR) 50 MG tablet Take 1 tablet (50 mg total) by mouth 2 (two) times daily. 60 tablet 1  . mirabegron ER (MYRBETRIQ) 50 MG TB24 tablet Take 50 mg by mouth at bedtime.    . mycophenolate (CELLCEPT) 500 MG tablet Take 500 mg by mouth 2 (two) times daily.    [name] omega-3 acid ethyl esters (LOVAZA) 1 g capsule Take 1 g by mouth 3 (three) times daily.    . ondansetron (ZOFRAN) 8 MG tablet Take 0.5 tablets (4 mg total) by mouth every 8 (eight) hours as needed for nausea or vomiting. 20 tablet 0  . pravastatin (PRAVACHOL) 40 MG tablet Take 40 mg by mouth at bedtime.    . predniSONE (DELTASONE) 2.5 MG tablet Take 2.5 mg by mouth daily with breakfast.    . promethazine (PHENERGAN) 25 MG suppository INSERT 1 SUPPOSITORY RECTALLY EVERY 6 HOURS AS NEEDED FOR NAUSEA OR VOMITING (Patient taking differently: INSERT 1 SUPPOSITORY (25 MG) RECTALLY EVERY  6 HOURS AS NEEDED FOR NAUSEA OR VOMITING) 12 suppository 0  . rizatriptan (MAXALT) 10 MG tablet Take 1 tablet (10 mg total) by mouth daily as needed for migraine. May repeat in 2 hours if needed 10 tablet 6  . sertraline (ZOLOFT) 100 MG tablet Take 50-100 mg by mouth See admin instructions. Take  1/2 tablet (50 mg) by mouth every morning and 1 tablet (100 mg) at night    . traMADol (ULTRAM) 50 MG tablet Take 50 mg by mouth 2 (two) times daily.     [name] acetaminophen-codeine (TYLENOL #4) 300-60 MG tablet Take 1 tablet by mouth every 6 (six) hours as needed for pain.    [name] acetaZOLAMIDE (DIAMOX) 250 MG tablet Take 2 tablets (500 mg total) by mouth 2 (two) times daily. (Patient taking differently: Take 500 mg by mouth daily. ) 360 tablet 3  . escitalopram (LEXAPRO) 20 MG tablet Take 1 tablet (20 mg total) by mouth daily. 30 tablet 0  . furosemide (LASIX) 40 MG tablet Take 1 tablet (40 mg total) by mouth 2 (two) times daily. (Patient taking differently: Take 20 mg by mouth 2 (two) times daily. ) 30 tablet 1   CURRENT HOSPITAL MEDS   Current Facility-Administered Medications:  .  0.9 %  sodium chloride infusion, 250 mL, Intravenous, PRN, Corey Harold, NP .  0.9 %  sodium chloride infusion, , Intravenous, Continuous, Corey Harold, NP, Last Rate: 10 mL/hr at 02/23/17 0420 .  anidulafungin (ERAXIS) 100 mg in sodium chloride 0.9 % 100 mL IVPB, 100 mg, Intravenous, Q24H, Rolla Flatten, [facility] .  chlorhexidine gluconate (MEDLINE KIT) (PERIDEX) 0.12 % solution 15 mL, 15 mL, Mouth Rinse, BID, Corey Harold, NP, 15 mL at 02/23/17 0850 .  feeding supplement (PRO-STAT SUGAR FREE 64) liquid 30 mL, 30 mL, Per Tube, BID, Raylene Miyamoto, MD, Stopped at 02/23/17 1000 .  feeding supplement (VITAL HIGH PROTEIN) liquid 1,000 mL, 1,000 mL, Per Tube, Q24H, Raylene Miyamoto, MD, Stopped at 02/23/17 1000 .  fentaNYL (SUBLIMAZE) bolus via infusion 50 mcg, 50 mcg, Intravenous, Q1H PRN, Corey Harold, NP, 50 mcg at 02/23/17 1156 .  fentaNYL 2564mg in NS 2528m(1040mml) infusion-PREMIX, 25-400 mcg/hr, Intravenous, Continuous, HofCorey HaroldP, Last Rate: 7.5 mL/hr at 02/22/17 2231, 75 mcg/hr at 02/22/17 2231 .  heparin injection 5,000 Units, 5,000 Units, Subcutaneous, Q8H, HofCorey HaroldP, 5,000  Units at 02/23/17 0601 .  hydrocortisone sodium succinate (SOLU-CORTEF) 100 MG injection 50 mg, 50 mg, Intravenous, Q6H, HofCorey HaroldP, 50 mg at 02/23/17 0825 .  levETIRAcetam (KEPPRA) 500 mg in sodium chloride 0.9 % 100 mL IVPB, 500 mg, Intravenous, Q12H, FeiRaylene MiyamotoD, Stopped at 02/23/17 [id number] [START ON 02/24/2017] levofloxacin (LEVAQUIN) IVPB 500 mg, 500 mg, Intravenous, Q48H, [facility] MEDLINE mouth rinse, 15 mL, Mouth Rinse, QID, HofCorey HaroldP, 15 mL at 02/23/17 1122 .  meropenem (MERREM) 500 mg in sodium chloride 0.9 % 50 mL IVPB, 500 mg, Intravenous, Q24H, Masters, AliJake ChurchPH .  midazolam (VERSED) injection 2 mg, 2 mg, Intravenous, Q2H PRN, [place], PedGrayce SessionsD, 2 mg at 02/23/17 1400 .  norepinephrine (LEVOPHED) 4 mg in dextrose 5 % 250 mL (0.016 mg/mL) infusion, 0-40 mcg/min, Intravenous, Titrated, HofCorey HaroldP, Stopped at 02/22/17 1715 .  pantoprazole (PROTONIX) injection 40 mg, 40 mg, Intravenous, QHS, HofCorey HaroldP, 40 mg at 02/22/17 2207  Allergies as of 02/23/2017      Reactions   Sulfa Antibiotics Itching      Medication List    STOP taking these medications   acetaminophen-codeine 300-60 MG tablet Commonly known as:  TYLENOL #4   acetaZOLAMIDE 250 MG tablet Commonly known as:  DIAMOX   aspirin 81 MG chewable tablet   butalbital-acetaminophen-caffeine 50-325-40 MG tablet Commonly known as:  FIORICET, ESGIC   clonazePAM 0.5 MG tablet Commonly known as:  KLONOPIN   cycloSPORINE 100 MG capsule Commonly known as:  SANDIMMUNE   cycloSPORINE 25 MG capsule Commonly known as:  SANDIMMUNE   escitalopram 20 MG tablet Commonly known as:  LEXAPRO   fenofibrate 160 MG tablet   flavoxATE 100 MG tablet Commonly known as:  URISPAS   furosemide 40 MG tablet Commonly known as:  LASIX   gabapentin 300 MG capsule Commonly known as:  NEURONTIN   magnesium oxide 400 MG tablet Commonly known as:  MAG-OX    methylphenidate 10 MG tablet Commonly known as:  RITALIN   metoprolol tartrate 50 MG tablet Commonly known as:  LOPRESSOR   mycophenolate 500 MG tablet Commonly known as:  CELLCEPT   MYRBETRIQ 50 MG Tb24 tablet Generic drug:  mirabegron ER   omega-3 acid ethyl esters 1 g capsule Commonly known as:  LOVAZA   ondansetron 8 MG tablet Commonly known as:  ZOFRAN   pravastatin 40 MG tablet Commonly known as:  PRAVACHOL   predniSONE 2.5 MG tablet Commonly known as:  DELTASONE   promethazine 25 MG suppository Commonly known as:  PHENERGAN   rizatriptan 10 MG tablet Commonly known as:  MAXALT   sertraline 100 MG tablet Commonly known as:  ZOLOFT   traMADol 50 MG tablet Commonly known as:  ULTRAM   Vitamin D-3 1000 units Caps   zolpidem 10 MG tablet Commonly known as:  AMBIEN     TAKE these medications   anidulafungin 100 mg in sodium chloride 0.9 % 100 mL Inject 100 mg into the vein daily.   chlorhexidine gluconate (MEDLINE KIT) 0.12 % solution Commonly known as:  PERIDEX 15 mLs by Mouth Rinse route 2 (two) times daily.   feeding supplement (PRO-STAT SUGAR FREE 64) Liqd Place 30 mLs into feeding tube 2 (two) times daily.   feeding supplement (VITAL HIGH PROTEIN) Liqd liquid Place 1,000 mLs into feeding tube daily.   fentaNYL Soln Commonly known as:  SUBLIMAZE Inject 50 mcg into the vein every hour as needed.   fentaNYL 10 mcg/ml Soln infusion Inject 25-400 mcg/hr into the vein continuous.   heparin 5000 UNIT/ML injection Inject 1 mL (5,000 Units total) into the skin every 8 (eight) hours.   hydrocortisone sodium succinate 100 MG Solr injection Commonly known as:  SOLU-CORTEF Inject 1 mL (50 mg total) into the vein every 6 (six) hours.   levETIRAcetam 500 mg in sodium chloride 0.9 % 100 mL Inject 500 mg into the vein every 12 (twelve) hours.   levofloxacin 500 MG/100ML Soln Commonly known as:  LEVAQUIN Inject 100 mLs (500 mg total) into the vein  every other day.   meropenem 500 mg in sodium chloride 0.9 % 50 mL Inject 500 mg into the vein daily.   midazolam 2 MG/2ML Soln injection Commonly known as:  VERSED Inject 2 mLs (2 mg total) into the vein every 2 (two) hours as needed for agitation (to maintain RASS goal.).   mouth rinse Liqd solution 15 mLs by Mouth Rinse route QID.   norepinephrine  4 mg in dextrose 5 % 250 mL Inject 0-40 mcg/min into the vein continuous.   pantoprazole 40 MG injection Commonly known as:  PROTONIX Inject 40 mg into the vein at bedtime.   sodium chloride 0.9 % infusion Inject 250 mLs into the vein as needed (if IV carrier fluid needed.).   sodium chloride 0.9 % infusion Inject 10 mLs into the vein continuous.         Disposition:  Will be transferred to [facility] center ICU .   Discharged Condition: Donna Shannon  Will be transferred to [facility] center ICU stable condition .    Time spent on disposition:  42 minutes   Signed: Avory Rahimi NP-C  [facility]  [phone]   02/23/2017

## 2017-02-23 NOTE — Consult Note (Signed)
Surgical Consultation Requesting provider: Dr. Tyson Alias  CC: sepsis  HPI: Obtained from chart review as patient is intubated and sedated. 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 She was admitted here to the ICU yesterday after presenting with 3 days of not feeling well and acting confused; she was febrile. She was taken by ambulance from [facility] to the ER; she was hypoxic per EMS with Sat 52% and gradually decompensated in the ER requiring intubation. She has been initiated on broad spectrum antibiotics and remains critically ill. The etiology of her illness has not yet been defined. General surgery is asked to see.  Allergies  Allergen Reactions  . Sulfa Antibiotics Itching    Past Medical History:  Diagnosis Date  . Anemia   . Anxiety   . Arthritis    "left knee" (04/24/2016)  . Back pain   . Chronic lower back pain   . Constipation   . Depression   . Edema   . Essential hypertension   . Factor V Leiden (HCC)    Donna Shannon 04/24/2016  . Frequent UTI    Donna Shannon 04/24/2016  . GERD (gastroesophageal reflux disease)   . Gout   . High cholesterol   . HTN (hypertension)   . Kidney disease   . Migraine    "weekly" (04/24/2016)  . Neurogenic bladder    augmented bladder, I/O self caths  . On home oxygen therapy    "2L; every night" (04/24/2016)  . Osteoporosis   . Ovarian cyst dx'd 04/22/2016   left  . Palpitations   . Pneumonia 03/2016   Donna Shannon 04/24/2016  . Renal transplant failure and rejection    age 27-11  . 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 Self-catheterizes urinary bladder    "~ 12 X/day" (04/24/2016)  . SOB (shortness of breath)   . Stroke [facility]) 1991   denies residual on 04/24/2016  . Vitamin D deficiency     Past Surgical  History:  Procedure Laterality Date  . ABDOMINAL HERNIA REPAIR  "several; when I was little"  . allograph biopsy  2013   Donna Shannon 04/24/2016  . HERNIA REPAIR    . KIDNEY TRANSPLANT  1996; 1998   father was donor; mother was donor/notes 04/24/2016  . PACEMAKER INSERTION  "?early 2000s"   for bladder function  . PARTIAL NEPHRECTOMY  1990s X 5   "removing disease"  . PORTACATH PLACEMENT Right 11/2015  . RIGHT OOPHORECTOMY Right 2001   ovarian torsion    Family History  Problem Relation Age of Onset  . Hypertension Mother   . CAD Father        s/p CABG  . Hyperlipidemia Father   . Heart disease Father   . Thyroid disease Father     Social History   Social History  . Marital status: Single    Spouse name: N/A  . Number of children: N/A  . Years of education: N/A   Occupational History  . LPN -school nurse    Social History Main Topics  . Smoking status: Former Smoker    Packs/day: 0.50    Years: 4.00    Types: Cigarettes    Quit date: 07/09/2014  . Smokeless tobacco: Never Used  . Alcohol use Yes     Comment: 04/24/2016 "  might have 2 drinks/month, if that"  . Drug use: No  . Sexual activity: No   Other Topics Concern  . Not on file   Social History Narrative   Lives mother and father   Caffeine use: 1 cup soda/day       No current facility-administered medications on file prior to encounter.    Current Outpatient Prescriptions on File Prior to Encounter  Medication Sig Dispense Refill  . acetaminophen-codeine (TYLENOL #4) 300-60 MG tablet Take 1 tablet by mouth every 6 (six) hours as needed for pain.    [name] acetaZOLAMIDE (DIAMOX) 250 MG tablet Take 2 tablets (500 mg total) by mouth 2 (two) times daily. (Patient taking differently: Take 500 mg by mouth daily. ) 360 tablet 3  . aspirin 81 MG chewable tablet Chew 81 mg by mouth daily.    . Cholecalciferol (VITAMIN D-3) 1000 units CAPS Take 1 capsule (1,000 Units total) by mouth daily. 30 capsule 0  . ciprofloxacin  (CIPRO) 500 MG tablet Take 500 mg by mouth 2 (two) times daily.    . clonazePAM (KLONOPIN) 0.5 MG tablet Take 0.5 mg by mouth See admin instructions. Take 2 tablets in the morning, then take one in the afternoon, then take 2 tablets at night per patient    . cycloSPORINE (SANDIMMUNE) 100 MG capsule Take 100 mg by mouth 2 (two) times daily.    . cycloSPORINE (SANDIMMUNE) 25 MG capsule Take 25 mg by mouth every morning.    . escitalopram (LEXAPRO) 20 MG tablet Take 1 tablet (20 mg total) by mouth daily. 30 tablet 0  . fenofibrate 160 MG tablet Take 160 mg by mouth daily.    . flavoxATE (URISPAS) 100 MG tablet Take 100 mg by mouth 3 (three) times daily. Take every day per patient    . furosemide (LASIX) 40 MG tablet Take 1 tablet (40 mg total) by mouth 2 (two) times daily. (Patient taking differently: Take 20 mg by mouth daily. ) 30 tablet 1  . gabapentin (NEURONTIN) 300 MG capsule Take 300 mg by mouth daily.     . magnesium oxide (MAG-OX) 400 MG tablet Take 400 mg by mouth daily.     . methylphenidate (RITALIN) 10 MG tablet Take 10 mg by mouth 2 (two) times daily.    . metoprolol (LOPRESSOR) 50 MG tablet Take 1 tablet (50 mg total) by mouth 2 (two) times daily. (Patient taking differently: Take 50 mg by mouth daily. ) 60 tablet 1  . mirabegron ER (MYRBETRIQ) 50 MG TB24 tablet Take 50 mg by mouth at bedtime.    . mycophenolate (CELLCEPT) 500 MG tablet Take 500 mg by mouth 2 (two) times daily.    [name] omega-3 acid ethyl esters (LOVAZA) 1 g capsule Take 1 g by mouth 3 (three) times daily.    . ondansetron (ZOFRAN) 8 MG tablet Take 0.5 tablets (4 mg total) by mouth every 8 (eight) hours as needed for nausea or vomiting. 20 tablet 0  . pravastatin (PRAVACHOL) 40 MG tablet Take 40 mg by mouth at bedtime.    . predniSONE (DELTASONE) 2.5 MG tablet Take 2.5 mg by mouth daily with breakfast.    . promethazine (PHENERGAN) 25 MG suppository INSERT 1 SUPPOSITORY RECTALLY EVERY 6 HOURS AS NEEDED FOR NAUSEA OR  VOMITING 12 suppository 0  . rizatriptan (MAXALT) 10 MG tablet Take 1 tablet (10 mg total) by mouth daily as needed for migraine. May repeat in 2 hours if needed 10 tablet 6  .  sertraline (ZOLOFT) 100 MG tablet Take 100 mg by mouth 2 (two) times daily. 1/2 pill in the AM, one pill in the PM    . traMADol (ULTRAM) 50 MG tablet Take 50 mg by mouth 2 (two) times daily.       Review of Systems: a complete, 10pt review of systems was not able to be completed due to patient intubated and sedated  Physical Exam: Vitals:   02/23/17 0930 02/23/17 1000  BP: 107/72 109/78  Pulse: 88 84  Resp: (!) 35 (!) 35  Temp:    SpO2: 99% 100%   Gen: intubated, on fentanyl drip, EEG in progress Head: normocephalic, atraumatic, anicteric.  Neck: no mass or thyromegaly Chest: on vent, rhonchi bilaterally Cardiovascular: RRR with palpable distal pulses. No pressors.  Abdomen: soft, nondistended, does not seem particularly tender. Midline incision with some skin separation at the center with fibrinous exudate; no surrounding erythema or induration or fluctuance Extremities: warm, without edema, no deformities Neuro: sedated Psych: unable to assess Skin: warm and dry   CBC Latest Ref Rng & Units 02/23/2017 02/22/2017 08/22/2016  WBC 4.0 - 10.5 K/uL 10.3 10.6(H) 5.8  Hemoglobin 12.0 - 15.0 g/dL 6.7(LL) 7.3(L) 10.7(L)  Hematocrit 36.0 - 46.0 % 23.1(L) 24.9(L) 33.0(L)  Platelets 150 - 400 K/uL 267 257 -    CMP Latest Ref Rng & Units 02/23/2017 02/22/2017 02/22/2017  Glucose 65 - 99 mg/dL 664(Q) 034(V) 93  BUN 6 - 20 mg/dL 42(V) 95(G) 38(V)  Creatinine 0.44 - 1.00 mg/dL 5.64(P) 3.29(J) 1.88(C)  Sodium 135 - 145 mmol/L 133(L) 133(L) 133(L)  Potassium 3.5 - 5.1 mmol/L 5.6(H) 6.0(H) 5.8(H)  Chloride 101 - 111 mmol/L 104 104 97(L)  CO2 22 - 32 mmol/L 17(L) 17(L) 23  Calcium 8.9 - 10.3 mg/dL 8.2(L) 8.1(L) 9.2  Total Protein 6.5 - 8.1 g/dL - - 6.4(L)  Total Bilirubin 0.3 - 1.2 mg/dL - - 1.3(H)  Alkaline Phos 38 -  126 U/L - - 94  AST 15 - 41 U/L - - 32  ALT 14 - 54 U/L - - 13(L)    Lab Results  Component Value Date   INR 1.17 02/22/2017   INR 0.93 04/29/2016   INR 1.0 05/14/2008    Imaging: CT CHEST, ABDOMEN AND PELVIS WITHOUT CONTRAST  TECHNIQUE: Multidetector CT imaging of the chest, abdomen and pelvis was performed following the standard protocol without IV contrast.  COMPARISON:  Chest radiograph February 22, 2017 ; CT abdomen and pelvis April 28, 2016  FINDINGS: CT CHEST FINDINGS  Cardiovascular: There is no thoracic aortic aneurysm. Visualized great vessels appear unremarkable. There is no appreciable pericardial effusion. There is a central catheter with the tip just beyond the tricuspid valve in the right ventricle.  Mediastinum/Nodes: Visualized thyroid appears unremarkable. There is no appreciable thoracic adenopathy.  Lungs/Pleura: Endotracheal tube tip is in the mid tracheal region. There is widespread interstitial and alveolar opacity throughout the lungs bilaterally. There is a fairly small right pleural effusion with a rather minimal left pleural effusion.  Musculoskeletal: No blastic or lytic bone lesions.  CT ABDOMEN PELVIS FINDINGS  Hepatobiliary: Liver measures 21.0 cm in length. No focal liver lesions are evident on this noncontrast enhanced study. Gallbladder is mildly distended without wall thickening evident. No appreciable biliary duct dilatation.  Pancreas: No pancreatic mass or inflammatory focus.  Spleen: No splenic lesions are evident.  Adrenals/Urinary Tract: Adrenals appear unremarkable bilaterally. The patient has had surgical removal of native kidneys bilaterally. There is a transplant  kidney in the left upper to mid pelvis. There is no transplant kidney mass or hydronephrosis. There is stranding in the pre nephric soft tissues of the left abdomen in the region of the kidney. There is no renal or ureteral calculus evident.  Urinary bladder is midline with wall thickness within normal limits.  Stomach/Bowel: There is mild distention of the rectum with stool and air. Most small bowel loops are filled with fluid. Stomach is filled with fluid material and air with slight gastric distention. Nasogastric tube tip is in the stomach. There is no appreciable bowel wall thickening. No bowel obstruction. No free air or portal venous air evident.  Vascular/Lymphatic: There is no abdominal aortic aneurysm. No vascular lesions are evident on this noncontrast enhanced study. There is no appreciable adenopathy in the abdomen or pelvis.  Reproductive: Uterus is overall normal in size and contour. Right ovary is not seen and by report is absent. Left ovary appears mildly prominent. There is soft tissue thickening in the left adnexal region with mild soft tissue stranding in this area. A well-defined mass is not seen in this area.  Other: Appendix not seen. There is no periappendiceal region inflammation. There is no abscess or ascites in the abdomen or pelvis. There is scarring in the anterior abdominal wall. There are surgical clips throughout the abdomen and pelvis, also present on prior study.  Musculoskeletal: There are no blastic or lytic bone lesions. There is no intramuscular lesion.  IMPRESSION: CT chest:  1. Widespread interstitial and alveolar opacity with small pleural effusions, larger on the right than on the left. Heart is prominent without pericardial effusion. The appearance suggest congestive heart failure. There may well be superimposed pneumonia and/ or ARDS. No pneumothorax evident.  2.  No evident adenopathy.  3. Central catheter tip just beyond tricuspid valve in the right ventricle.  CT abdomen and pelvis:  1. Status post nephrectomies bilaterally with transplant kidney in left upper to mid pelvis. There is soft tissue stranding in the perirenal region on the left raising  concern for a degree of pyelonephritis on the left in the transplant kidney. No renal mass or abscess seen in this area. No hydronephrosis. No left transplant renal or ureteral calculus.  2. Right ovary absent. Left ovary mildly prominent without mass. There is thickening of soft tissue in the adnexal/fallopian tube region on the left with mild soft tissue stranding. This finding potentially could indicate a degree of inflammation with potential for developing tubo-ovarian abscess on the left. Well-defined abscess not seen by CT, however. Early torsion conceivably could present in this manner involving the left ovary. This finding may warrant pelvic ultrasound to further evaluate in this regard.  3. Most small bowel loops are fluid-filled. This finding may be seen normally but also may be seen with early ileus or enteritis. No bowel obstruction. Stomach mildly distended with fluid and air with orogastric tube tip in stomach.  4. No periappendiceal region inflammation. No abscess in the abdomen or pelvis.  5. Prominent liver without focal lesions seen on this noncontrast enhanced study. Gallbladder mildly distended without evident wall thickening.   Electronically Signed   By: Bretta Bang III M.D.   On: 02/22/2017 18:00   A/P: 31yo immunocompromised patient with fever, vent dependent respiratory failure and altered mental status. I have personally reviewed her CT scan. There is not any surgically intervenable abnormality. We will continue to follow, if she is not making progress in a few days could consider repeat scan,  but I suspect the nidus is pulmonary. Consider transplant ID consult/ evaluate for atypical pathogens given her hx of renal transplant.    Phylliss Blakes, MD [facility], [place] Pager [phone]

## 2017-02-23 NOTE — Progress Notes (Signed)
  CRITICAL VALUE ALERT  Critical Value:  Hgb 6.7  Date & Time Notied:  8:18 AM 02/23/17  Provider Notified: Dr. Tyson Alias  Orders Received/Actions taken: 1 unit PRBC

## 2017-02-23 NOTE — Progress Notes (Signed)
STAT EEG Completed; Results Pending   

## 2017-02-23 NOTE — Procedures (Signed)
Aline attempted per MD order by two RT's, unable to place, MD made aware.

## 2017-02-24 DIAGNOSIS — R569 Unspecified convulsions: Secondary | ICD-10-CM | POA: Insufficient documentation

## 2017-02-24 LAB — BPAM RBC
Blood Product Expiration Date: 201809222359
ISSUE DATE / TIME: 201808181119
Unit Type and Rh: 5100

## 2017-02-24 LAB — TYPE AND SCREEN
ABO/RH(D): O POS
Antibody Screen: NEGATIVE
UNIT DIVISION: 0

## 2017-02-24 LAB — HIV ANTIBODY (ROUTINE TESTING W REFLEX): HIV Screen 4th Generation wRfx: NONREACTIVE

## 2017-02-25 LAB — URINE CULTURE: Culture: 80000 — AB

## 2017-02-25 LAB — LEGIONELLA PNEUMOPHILA SEROGP 1 UR AG: L. pneumophila Serogp 1 Ur Ag: NEGATIVE

## 2017-02-25 LAB — GLUCOSE, CAPILLARY: Glucose-Capillary: 89 mg/dL (ref 65–99)

## 2017-02-25 LAB — CULTURE, BLOOD (ROUTINE X 2): SPECIAL REQUESTS: ADEQUATE

## 2017-02-27 LAB — CULTURE, BLOOD (ROUTINE X 2)
Culture: NO GROWTH
Special Requests: ADEQUATE

## 2017-03-04 NOTE — Telephone Encounter (Signed)
Donna Shannon, can you see if patient can come to the office Friday at noon to be seen? If she needs to be seen sooner we will need to juggle around some patients or I will need to see her late like 5pm one day. Thanks

## 2017-03-04 NOTE — Telephone Encounter (Signed)
Patient is calling. She was taken to [facility] on 02-22-17 for seizures and was put on ventilator. She was transferred to [facility] on  02-23-17  and stayed on ventilator until 02-24-17. She would like to discuss the visit.

## 2017-03-05 NOTE — Telephone Encounter (Signed)
Called and LVM for pt, offering appt this Friday at 12pm, check in 1130am. Asked her to call back and let us know if this works.

## 2017-03-06 ENCOUNTER — Telehealth: Payer: Self-pay | Admitting: Neurology

## 2017-03-06 NOTE — Telephone Encounter (Signed)
Pt father apologizes for missing the call of RN Kara Mead, he is asking for a call back please

## 2017-03-06 NOTE — Telephone Encounter (Signed)
Called father back, LVM for him to call.  Please skype me, I will take the call

## 2017-03-06 NOTE — Telephone Encounter (Signed)
Called pt again since no return call. Scheduled appt for this Friday with Dr Lucia Gaskins at 12pm. Pt verbalized understanding. She stated she never received my message from yesterday.

## 2017-03-06 NOTE — Telephone Encounter (Signed)
Called [facility] back. Spoke with mother/father. They accepted appt tomorrow at 2pm with Dr Lucia Gaskins. Cx appt for Friday at 12pm that was previously made with Dr Lucia Gaskins.

## 2017-03-06 NOTE — Telephone Encounter (Signed)
Pt dad calling re: appointment on Friday, pt has recently come home from ICU in the hospital, pt lives in [place] and [place] an [place] in [place] with [place]ona [place] pt kidney dr @1 .  Pt dad would like to know if it is anyway possible for pt to be seen by Dr Lucia GaskinsAhern on tomorrow afternoon so that they would not have to make multiple trips from [place] to [place] due to weak state of pt. Please call

## 2017-03-07 ENCOUNTER — Encounter: Payer: Self-pay | Admitting: Neurology

## 2017-03-07 ENCOUNTER — Ambulatory Visit (INDEPENDENT_AMBULATORY_CARE_PROVIDER_SITE_OTHER): Payer: Medicare Other | Admitting: Neurology

## 2017-03-07 VITALS — BP 111/74 | HR 69 | Ht 62.0 in | Wt 152.8 lb

## 2017-03-07 DIAGNOSIS — G932 Benign intracranial hypertension: Secondary | ICD-10-CM | POA: Diagnosis not present

## 2017-03-08 ENCOUNTER — Ambulatory Visit: Payer: Self-pay | Admitting: Neurology

## 2017-03-12 ENCOUNTER — Other Ambulatory Visit: Payer: Self-pay

## 2017-03-12 ENCOUNTER — Other Ambulatory Visit: Payer: Self-pay | Admitting: Neurology

## 2017-03-12 MED ORDER — ALPRAZOLAM 0.5 MG PO TABS: 0.5000 mg | ORAL_TABLET | Freq: Three times a day (TID) | ORAL | 0 refills | Status: DC | PRN

## 2017-03-12 MED ORDER — PROMETHAZINE HCL 25 MG RE SUPP: 25.0000 mg | Freq: Four times a day (QID) | RECTAL | 0 refills | Status: DC | PRN

## 2017-03-12 NOTE — Progress Notes (Signed)
[facility]    Provider: Dr Lucia Shannon Referring Provider: Annie Sable, MD Primary Care Physician: No PCP Per Patient  CC: Papilledema, elevated intracranial pressure  Interval history 05/07/2017: Since last being seen patient has had a complicated medical journey including abdominal surgery, acute kidney injury, acute respiratory failure with hypoxia and hypercapnia, sepsis, ARDS, hypertension, acute metabolic encephalopathy. Patient was hospitalized both [facility] and [facility]. While at [facility] lumbar puncture was performed which showed an opening pressure of 42 and the neurologist recommended shunting. Had a long discussion with patient, I question the validity of the opening pressure considering she has lost 70 pounds and her funduscopic exam is unchanged as are her symptoms. I do not recommend shunting at this time unless we have proof of worsening. We will repeat a lumbar puncture. We will send her to Donna Shannon today. Here with her mother who also provides information.  Interval history 08/06/2016: Donna Shannon a 31 y.o.femalehere as a referral from Donna. Gwenyth Shannon papilledema and increased intracranial pressure with intractable headaches. 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  LP opening pressure was 29 and the headache was relieved after LP and she was placed on acetazolamide. She was admitted again earlier this month with worsening headache and hypercapnea. Repeat LP showed decreased opening pressure likely due to acetazolamide treatment and MRI/MV were normal. 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  She was medically managed while inpatient and started on Bipap at night and her headaches are again improved.   She feels better she just has a lot of nausea. Her headache is improved, she gets dull ones every now and then. She is using bipap at night. She is scheduled at [facility] long for her sleep study February 9th. She is following up with Donna Shannon. Her vision is better, no blurry vision. She started a walking regimen with one of her friends. She can;t walk fast without getting winded. She has gained a lot of weight. Discussed the healthy weight [facility] wellness center and will place a referral for her. In the hospital her CO2 was 60 and she was treated with bipap and she feels better.    JOA:CZYSAYTKZ Shannon a 31 y.o.femalehere as a referral from Donna. Gwenyth Shannon papilledema and increased intracranial pressure with intractable headaches. 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 She reported dyspnea on exertion, pneumonia, increased use of inhaler on admission. She is on CellCept, prednisone and cyclosporine. She has had worsening migraines. She was given Depakote and Imitrex and CPAP while inpatient. Patient reportedthat headache started several days prior to admission with severe nausea and vomiting. The headaches were different than her normal migraines. She felt pressure all around the head, blurry vision, worse when laying flat, lots of pressure and throbbing, she was also hearing swishing and a heartbeat in her ear, lots of nausea and vomiting and was be a 10 out of 10 in pain. Lumbar puncture was performed in the hospital with an opening pressure which was elevated  at 27. Patient's  headache improved after lumbar puncture and she was started on Diamox. CT scan of the head was performed and patient but MRI imaging was not. She was discharged with instructions for follow-up outpatient MRI, MRV as well as sleep evaluation and ophthalmologic follow-up for papilledema. Patient was discharged at the end of October and returns today. She was unable to have any of this workup completed in [place]. She continues to have daily headaches. She is using her father's CPAP machine which I highly discouraged as this can cause harm to her lungs if the CPAP is not calibrated properly. She reports that she cannot sleep without his CPAP at this point and she wakes with morning severe headaches. She has pulsating, throbbing headache, both sides of the head, pressure. She had a sleep test 4 years ago and diagnosed with OSA but has not followed up with sleep doctor and wearing her father's cpap at this time. Headaches are every day. Wearing the cpap helps, advised not to use other people's cpaps because can cause lung damage. Headaches are daily andcan be a 7/10 in pain, she has blurry vision and he vision is changing. They can last all day. Her parents are here and provide more information and patient does. At next visit I will advise that patient's comes in alone for the first 30 minutes.  Reviewed notes, labs and imaging from outside physicians, which showed:   LP 04/29/2016: The patient was prepped and draped, and using sterile technique a 20 gauge quinke spinal needle was inserted in the L3-L4 space. The opening pressure was 29 cm H2O. Approximately 20cc of CSF were obtained and sent for analysis. Closing pressure was 13 cm H2O   CT head 04/29/2016: Personally reviewed images and agree with the following  FINDINGS: Brain: No intracranial hemorrhage or CT evidence of large acute infarct.  Mild atrophy without hydrocephalus.  No intracranial mass lesion noted on this unenhanced  exam.  Vascular: No hyperdense vessel to indicate acute infarct or dural sinus thrombosis. If this is of high clinical concern MR imaging could be performed.  Skull: No skull base abnormality.  Sinuses/Orbits: No acute orbital abnormality. Mild exophthalmos. Visualized sinuses and mastoid air cells are clear.  Other: Negative  IMPRESSION: No intracranial hemorrhage or CT evidence of large acute infarct.  No hyperdense vessel to suggest dural sinus thrombosis. If this is of high clinical concern, MR imaging may then be considered.  Mild atrophy.  Mild exophthalmos. Review of Systems: Patient complains of symptoms per HPI as well as the following symptoms: tremor, abdominal incision. Pertinent negatives and positives per HPI. All others negative.   Social History   Social History  . Marital status: Single    Spouse name: N/A  . Number of children: N/A  . Years of education: N/A   Occupational History  . LPN -school nurse    Social History Main Topics  . Smoking status: Former Smoker    Packs/day: 0.50    Years: 4.00    Types: Cigarettes    Quit date: 07/09/2014  . Smokeless tobacco: Never Used  . Alcohol use Yes     Comment: 04/24/2016 "might have 2 drinks/month, if that"  . Drug use: No  . Sexual activity: No   Other Topics Concern  . Not on file   Social History Narrative   Lives mother and father   Caffeine use: 1 cup soda/day       Family History  Problem Relation Age of Onset  .  Hypertension Mother   . CAD Father        s/p CABG  . Hyperlipidemia Father   . Heart disease Father   . Thyroid disease Father     Past Medical History:  Diagnosis Date  . Anemia   . Anxiety   . Arthritis    "left knee" (04/24/2016)  . Back pain   . Chronic lower back pain   . Constipation   . Depression   . Edema   . Essential hypertension   . Factor V Leiden (HCC)    [name] 04/24/2016  . Frequent UTI    [name] 04/24/2016  . GERD (gastroesophageal  reflux disease)   . Gout   . High cholesterol   . HTN (hypertension)   . Kidney disease   . Migraine    "weekly" (04/24/2016)  . Neurogenic bladder    augmented bladder, I/O self caths  . On home oxygen therapy    "2L; every night" (04/24/2016)  . Osteoporosis   . Ovarian cyst dx'd 04/22/2016   left  . Palpitations   . Pneumonia 03/2016   [name] 04/24/2016  . Renal transplant failure and rejection    age 91-11  . 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 Seizures (HCC)   . Self-catheterizes urinary bladder    "~ 12 X/day" (04/24/2016)  . SOB (shortness of breath)   . Stroke [facility]) 1991   denies residual on 04/24/2016  . Vitamin D deficiency     Past Surgical History:  Procedure Laterality Date  . ABDOMINAL HERNIA REPAIR  "several; when I was little"  . allograph biopsy  2013   [name] 04/24/2016  . HERNIA REPAIR    . KIDNEY TRANSPLANT  1996; 1998   father was donor; mother was donor/notes 04/24/2016  . PACEMAKER INSERTION  "?early 2000s"   for bladder function  . PARTIAL NEPHRECTOMY  1990s X 5   "removing disease"  . PORTACATH PLACEMENT Right 11/2015  . RIGHT OOPHORECTOMY Right 2001   ovarian torsion    Current Outpatient Prescriptions  Medication Sig Dispense Refill  . Amino Acids-Protein Hydrolys (FEEDING SUPPLEMENT, PRO-STAT SUGAR FREE 64,) LIQD Place 30 mLs into feeding tube 2 (two) times daily. 900 mL 0  . anidulafungin 100 mg in sodium chloride 0.9 % 100 mL Inject 100 mg into the vein daily.    . heparin 5000 UNIT/ML injection Inject 1 mL (5,000 Units total) into the skin every 8 (eight) hours. 1 mL   . levETIRAcetam 500 mg in sodium chloride 0.9 % 100 mL Inject 500 mg into the vein every 12 (twelve) hours.    [name] levofloxacin (LEVAQUIN) 500 MG/100ML SOLN Inject 100 mLs (500 mg total) into the vein every other day. 1000 mL   . meropenem 500 mg in sodium chloride 0.9 % 50 mL Inject 500 mg into the vein daily.     . norepinephrine 4 mg in dextrose 5 % 250 mL Inject 0-40 mcg/min into the vein continuous.    . pantoprazole (PROTONIX) 40 MG injection Inject 40 mg into the vein at bedtime. 1 each   . sodium chloride 0.9 % infusion Inject 250 mLs into the vein as needed (if IV carrier fluid needed.).  0  . sodium chloride 0.9 % infusion Inject 10 mLs into the vein continuous.  0  . promethazine (PHENERGAN) 25 MG suppository Place 1 suppository (25 mg total) rectally every 6 (six) hours as needed  for nausea or vomiting. 12 each 0   No current facility-administered medications for this visit.     Allergies as of 03/07/2017 - Review Complete 03/07/2017  Allergen Reaction Noted  . Sulfa antibiotics Itching 06/25/2016    Vitals: BP 111/74 (BP Location: Right Arm, Patient Position: Sitting, Cuff Size: Normal)   Pulse 69   Ht 5\' 2"  (1.575 m)   Wt 152 lb 12.8 oz (69.3 kg)   BMI 27.95 kg/m  Last Weight:  Wt Readings from Last 1 Encounters:  03/07/17 152 lb 12.8 oz (69.3 kg)   Last Height:   Ht Readings from Last 1 Encounters:  03/07/17 5\' 2"  (1.575 m)   Physical exam: Exam: Gen: NAD, conversant, well nourised, well groomed                     CV: RRR, no MRG. No Carotid Bruits. No peripheral edema, warm, nontender Eyes: Conjunctivae clear without exudates or hemorrhage  Neuro: Detailed Neurologic Exam  Speech:    Speech is normal; fluent and spontaneous with normal comprehension.  Cognition:    The patient is oriented to person, place, and time;     recent and remote memory intact;     language fluent;     normal attention, concentration,     fund of knowledge Cranial Nerves:    The pupils are equal, round, and reactive to light. The fundi are normal and spontaneous venous pulsations are present. Visual fields are full to finger confrontation. Extraocular movements are intact. Trigeminal sensation is intact and the muscles of mastication are normal. The face is symmetric. The palate elevates  in the midline. Hearing intact. Voice is normal. Shoulder shrug is normal. The tongue has normal motion without fasciculations.   Motor Observation:    High frequency tremor. Tone:    Normal muscle tone.    Posture:    Posture is normal. normal erect    Strength:    Strength is V/V in the upper and lower limbs.        Assessment/Plan:Donna Shannon a 31 y.o. femalehere as a referral from Donna. Dara LordsGoldsboroughwith intractable headaches. 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  Opening pressure was mildly elevated at 29 and headache improved after LP, she was started on Diamox and is followed by ophthalmology.  Since last being seen patient has had a complicated medical journey including abdominal surgery, acute kidney injury, acute respiratory failure with hypoxia and hypercapnia, sepsis, ARDS, hypertension, acute metabolic encephalopathy. Patient was [facility] both [facility]. [facility] which showed an opening pressure of 42 and the neurologist recommended shunting. Had a long discussion with patient, I question the validity of the opening pressure considering she has lost 70 pounds and her funduscopic exam is unchanged as are her symptoms. I do not recommend shunting at this time unless we have proof of worsening. We will repeat a lumbar puncture. We will send her to Donna. Sallye Lathristopher Shannon today. Here with her mother who also provides information.    Donna DeanAntonia Ahern, MD  [facility] [address] [place], [place] [id number]  Phone [phone] Fax [phone]  A total of 30 minutes was spent face-to-face with  this patient. 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

## 2017-03-12 NOTE — Telephone Encounter (Signed)
It appears that promethazine supp was d/c post hospitalization. I spoke with Dr. Lucia GaskinsAhern and she is agreeable to refilling the promethazine suppository for pt. Order placed.

## 2017-03-13 ENCOUNTER — Telehealth: Payer: Self-pay

## 2017-03-13 NOTE — Telephone Encounter (Signed)
Received faxed 03/07/17 OV notes from Dr. 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 Marchelle Gearinghris Groat  for Limited Exam - LMT." Sent to med records for scanning, copy to Dr. Lucia GaskinsAhern for review.

## 2017-03-13 NOTE — Progress Notes (Signed)
[phone].

## 2017-03-28 ENCOUNTER — Telehealth: Payer: Self-pay

## 2017-03-28 ENCOUNTER — Ambulatory Visit: Payer: Medicare Other | Admitting: Neurology

## 2017-03-28 ENCOUNTER — Ambulatory Visit
Admission: RE | Admit: 2017-03-28 | Discharge: 2017-03-28 | Disposition: A | Discharge status: Home / Self Care | Payer: BLUE CROSS/BLUE SHIELD | Source: Ambulatory Visit | Attending: Neurology | Admitting: Neurology

## 2017-03-28 DIAGNOSIS — G932 Benign intracranial hypertension: Secondary | ICD-10-CM

## 2017-03-28 LAB — CSF CELL COUNT WITH DIFFERENTIAL
RBC Count, CSF: 0 cells/uL (ref 0–10)
WBC, CSF: 0 cells/uL (ref 0–5)

## 2017-03-28 LAB — PROTEIN, CSF: Total Protein, CSF: 44 mg/dL (ref 15–45)

## 2017-03-28 LAB — GLUCOSE, CSF: GLUCOSE CSF: 62 mg/dL (ref 40–80)

## 2017-03-28 NOTE — Discharge Instructions (Signed)

## 2017-03-28 NOTE — Telephone Encounter (Signed)
Rn call patient that the opening pressure of LP was 21. PT verbalized understanding.

## 2017-03-28 NOTE — Telephone Encounter (Signed)
-----   Message from Anson Fret, MD sent at 03/28/2017 10:38 AM EDT ----- Opening pressure looks good at 21. thanks

## 2017-04-01 ENCOUNTER — Other Ambulatory Visit: Payer: Self-pay | Admitting: Neurology

## 2017-04-01 NOTE — Telephone Encounter (Signed)
Pt called in she is experiencing HA's in the temples since Saturday. She said it is better when she lies down. She has tried OTC medication and is takes ultram for her back which has helped with the pain also. Please call her at [phone]

## 2017-04-01 NOTE — Telephone Encounter (Signed)
Pt had LP done last Thursday. She has been having headaches which are relieved some with aleve, ultram, maxalt, phenergan and lying down.  She is states that are not getting progressivley better.  They are staying the same.  She is on diamox per note. I donot see on med list. Would she need blood patch? This late in the game or time.  I told her to continue fluids and caffiene.  Please advise.

## 2017-04-01 NOTE — Telephone Encounter (Signed)
Yes, can you order a blood patch please for patient? Talk to Annabelle Harman if you are unsure how to order. We will also have to call [place] [facility] to get her shceduled hopefully tomorrow.

## 2017-04-02 ENCOUNTER — Ambulatory Visit
Admission: RE | Admit: 2017-04-02 | Discharge: 2017-04-02 | Disposition: A | Discharge status: Home / Self Care | Payer: Medicare Other | Source: Ambulatory Visit | Attending: Neurology | Admitting: Neurology

## 2017-04-02 ENCOUNTER — Other Ambulatory Visit: Payer: Self-pay | Admitting: Neurology

## 2017-04-02 DIAGNOSIS — G932 Benign intracranial hypertension: Secondary | ICD-10-CM

## 2017-04-02 MED ORDER — IOPAMIDOL (ISOVUE-M 200) INJECTION 41%
1.0000 mL | Freq: Once | INTRAMUSCULAR | Status: AC
  Administered 2017-04-02: 1 mL via EPIDURAL

## 2017-04-02 NOTE — Telephone Encounter (Signed)
Spoke to pt and relayed that per Dr. Lucia Gaskins to have blood patch at [facility].  Pt aware.  Order to be placed and pt will be called hopefully soon to help get some relief from positional headaches.

## 2017-04-02 NOTE — Addendum Note (Signed)
Addended by: Hermenia Fiscal S on: 04/02/2017 09:00 AM   Modules accepted: Orders

## 2017-04-02 NOTE — Discharge Instructions (Signed)

## 2017-04-02 NOTE — Progress Notes (Signed)
Blood drawn for blood patch from right AC. Site is unremarkable and pt tolerated well

## 2017-05-21 ENCOUNTER — Other Ambulatory Visit: Payer: Self-pay | Admitting: Neurology

## 2017-06-21 ENCOUNTER — Other Ambulatory Visit: Payer: Self-pay | Admitting: Neurology

## 2017-06-27 ENCOUNTER — Telehealth: Payer: Self-pay | Admitting: *Deleted

## 2017-06-27 NOTE — Telephone Encounter (Signed)
Completed PA on Cover My Meds   KeyVelora Heckler: [id number] - PA Case ID: [id number]  Your information has been submitted to [employer]. [employer] will review the request and will issue a decision, typically within 1-3 days from your submission. You can check the updated outcome later by reopening this request. If [employer] has not responded in 1-3 days or if you have any questions about your ePA request, please contact [employer] at [phone]. If you think there may be a problem with your PA request, use our live chat feature at the bottom right.

## 2017-06-28 ENCOUNTER — Inpatient Hospital Stay
Admission: AD | Admit: 2017-06-28 | Payer: Self-pay | Source: Other Acute Inpatient Hospital | Admitting: Family Medicine

## 2017-06-29 ENCOUNTER — Encounter (HOSPITAL_COMMUNITY): Payer: Self-pay | Admitting: Emergency Medicine

## 2017-06-29 ENCOUNTER — Emergency Department (HOSPITAL_COMMUNITY): Payer: Medicare Other

## 2017-06-29 ENCOUNTER — Inpatient Hospital Stay (HOSPITAL_COMMUNITY): Payer: Medicare Other

## 2017-06-29 ENCOUNTER — Inpatient Hospital Stay (HOSPITAL_COMMUNITY)
Admission: EM | Admit: 2017-06-29 | Discharge: 2017-07-05 | DRG: 871 | Disposition: A | Discharge status: Home / Self Care | Payer: Medicare Other | Attending: Family Medicine | Admitting: Family Medicine

## 2017-06-29 DIAGNOSIS — J157 Pneumonia due to Mycoplasma pneumoniae: Secondary | ICD-10-CM | POA: Diagnosis present

## 2017-06-29 DIAGNOSIS — Z79899 Other long term (current) drug therapy: Secondary | ICD-10-CM | POA: Diagnosis not present

## 2017-06-29 DIAGNOSIS — Z950 Presence of cardiac pacemaker: Secondary | ICD-10-CM

## 2017-06-29 DIAGNOSIS — E872 Acidosis: Secondary | ICD-10-CM

## 2017-06-29 DIAGNOSIS — N183 Chronic kidney disease, stage 3 unspecified: Secondary | ICD-10-CM | POA: Diagnosis present

## 2017-06-29 DIAGNOSIS — J9602 Acute respiratory failure with hypercapnia: Secondary | ICD-10-CM | POA: Diagnosis present

## 2017-06-29 DIAGNOSIS — M810 Age-related osteoporosis without current pathological fracture: Secondary | ICD-10-CM | POA: Diagnosis present

## 2017-06-29 DIAGNOSIS — G4733 Obstructive sleep apnea (adult) (pediatric): Secondary | ICD-10-CM | POA: Diagnosis present

## 2017-06-29 DIAGNOSIS — E785 Hyperlipidemia, unspecified: Secondary | ICD-10-CM | POA: Diagnosis present

## 2017-06-29 DIAGNOSIS — G9341 Metabolic encephalopathy: Secondary | ICD-10-CM | POA: Diagnosis present

## 2017-06-29 DIAGNOSIS — N179 Acute kidney failure, unspecified: Secondary | ICD-10-CM

## 2017-06-29 DIAGNOSIS — N319 Neuromuscular dysfunction of bladder, unspecified: Secondary | ICD-10-CM | POA: Diagnosis present

## 2017-06-29 DIAGNOSIS — J9601 Acute respiratory failure with hypoxia: Secondary | ICD-10-CM

## 2017-06-29 DIAGNOSIS — T8619 Other complication of kidney transplant: Secondary | ICD-10-CM | POA: Diagnosis present

## 2017-06-29 DIAGNOSIS — Z7952 Long term (current) use of systemic steroids: Secondary | ICD-10-CM | POA: Diagnosis not present

## 2017-06-29 DIAGNOSIS — Z9911 Dependence on respirator [ventilator] status: Secondary | ICD-10-CM

## 2017-06-29 DIAGNOSIS — D6851 Activated protein C resistance: Secondary | ICD-10-CM | POA: Diagnosis present

## 2017-06-29 DIAGNOSIS — F329 Major depressive disorder, single episode, unspecified: Secondary | ICD-10-CM | POA: Diagnosis present

## 2017-06-29 DIAGNOSIS — D631 Anemia in chronic kidney disease: Secondary | ICD-10-CM | POA: Diagnosis present

## 2017-06-29 DIAGNOSIS — Z905 Acquired absence of kidney: Secondary | ICD-10-CM

## 2017-06-29 DIAGNOSIS — Y830 Surgical operation with transplant of whole organ as the cause of abnormal reaction of the patient, or of later complication, without mention of misadventure at the time of the procedure: Secondary | ICD-10-CM | POA: Diagnosis present

## 2017-06-29 DIAGNOSIS — Z7982 Long term (current) use of aspirin: Secondary | ICD-10-CM

## 2017-06-29 DIAGNOSIS — N189 Chronic kidney disease, unspecified: Secondary | ICD-10-CM

## 2017-06-29 DIAGNOSIS — Z888 Allergy status to other drugs, medicaments and biological substances status: Secondary | ICD-10-CM

## 2017-06-29 DIAGNOSIS — E669 Obesity, unspecified: Secondary | ICD-10-CM | POA: Diagnosis present

## 2017-06-29 DIAGNOSIS — Z940 Kidney transplant status: Secondary | ICD-10-CM

## 2017-06-29 DIAGNOSIS — I129 Hypertensive chronic kidney disease with stage 1 through stage 4 chronic kidney disease, or unspecified chronic kidney disease: Secondary | ICD-10-CM | POA: Diagnosis present

## 2017-06-29 DIAGNOSIS — G43909 Migraine, unspecified, not intractable, without status migrainosus: Secondary | ICD-10-CM | POA: Diagnosis present

## 2017-06-29 DIAGNOSIS — R6521 Severe sepsis with septic shock: Secondary | ICD-10-CM | POA: Diagnosis present

## 2017-06-29 DIAGNOSIS — I10 Essential (primary) hypertension: Secondary | ICD-10-CM | POA: Diagnosis not present

## 2017-06-29 DIAGNOSIS — Z90721 Acquired absence of ovaries, unilateral: Secondary | ICD-10-CM

## 2017-06-29 DIAGNOSIS — Z881 Allergy status to other antibiotic agents status: Secondary | ICD-10-CM

## 2017-06-29 DIAGNOSIS — E6609 Other obesity due to excess calories: Secondary | ICD-10-CM | POA: Diagnosis not present

## 2017-06-29 DIAGNOSIS — J9600 Acute respiratory failure, unspecified whether with hypoxia or hypercapnia: Secondary | ICD-10-CM | POA: Diagnosis present

## 2017-06-29 DIAGNOSIS — E876 Hypokalemia: Secondary | ICD-10-CM | POA: Diagnosis present

## 2017-06-29 DIAGNOSIS — A419 Sepsis, unspecified organism: Principal | ICD-10-CM | POA: Diagnosis present

## 2017-06-29 DIAGNOSIS — Z9981 Dependence on supplemental oxygen: Secondary | ICD-10-CM

## 2017-06-29 DIAGNOSIS — K219 Gastro-esophageal reflux disease without esophagitis: Secondary | ICD-10-CM | POA: Diagnosis present

## 2017-06-29 DIAGNOSIS — R197 Diarrhea, unspecified: Secondary | ICD-10-CM | POA: Diagnosis not present

## 2017-06-29 DIAGNOSIS — Z882 Allergy status to sulfonamides status: Secondary | ICD-10-CM

## 2017-06-29 DIAGNOSIS — Z7960 Long term (current) use of unspecified immunomodulators and immunosuppressants: Secondary | ICD-10-CM

## 2017-06-29 DIAGNOSIS — N135 Crossing vessel and stricture of ureter without hydronephrosis: Secondary | ICD-10-CM | POA: Diagnosis present

## 2017-06-29 DIAGNOSIS — Z6830 Body mass index (BMI) 30.0-30.9, adult: Secondary | ICD-10-CM

## 2017-06-29 DIAGNOSIS — E874 Mixed disorder of acid-base balance: Secondary | ICD-10-CM | POA: Diagnosis present

## 2017-06-29 DIAGNOSIS — J80 Acute respiratory distress syndrome: Secondary | ICD-10-CM | POA: Diagnosis present

## 2017-06-29 DIAGNOSIS — Z886 Allergy status to analgesic agent status: Secondary | ICD-10-CM

## 2017-06-29 DIAGNOSIS — E559 Vitamin D deficiency, unspecified: Secondary | ICD-10-CM | POA: Diagnosis present

## 2017-06-29 DIAGNOSIS — Z8673 Personal history of transient ischemic attack (TIA), and cerebral infarction without residual deficits: Secondary | ICD-10-CM

## 2017-06-29 DIAGNOSIS — Z8349 Family history of other endocrine, nutritional and metabolic diseases: Secondary | ICD-10-CM

## 2017-06-29 DIAGNOSIS — Z8249 Family history of ischemic heart disease and other diseases of the circulatory system: Secondary | ICD-10-CM

## 2017-06-29 DIAGNOSIS — E8729 Other acidosis: Secondary | ICD-10-CM

## 2017-06-29 DIAGNOSIS — Z87891 Personal history of nicotine dependence: Secondary | ICD-10-CM

## 2017-06-29 DIAGNOSIS — G8929 Other chronic pain: Secondary | ICD-10-CM | POA: Diagnosis present

## 2017-06-29 LAB — CBC WITH DIFFERENTIAL/PLATELET
BASOS PCT: 0 %
Basophils Absolute: 0 10*3/uL (ref 0.0–0.1)
Eosinophils Absolute: 0.2 10*3/uL (ref 0.0–0.7)
Eosinophils Relative: 1 %
HEMATOCRIT: 33.9 % — AB (ref 36.0–46.0)
Hemoglobin: 10.1 g/dL — ABNORMAL LOW (ref 12.0–15.0)
LYMPHS ABS: 1.7 10*3/uL (ref 0.7–4.0)
Lymphocytes Relative: 8 %
MCH: 28.2 pg (ref 26.0–34.0)
MCHC: 29.8 g/dL — AB (ref 30.0–36.0)
MCV: 94.7 fL (ref 78.0–100.0)
MONO ABS: 0.6 10*3/uL (ref 0.1–1.0)
Monocytes Relative: 3 %
Neutro Abs: 18.3 10*3/uL — ABNORMAL HIGH (ref 1.7–7.7)
Neutrophils Relative %: 88 %
Platelets: 279 10*3/uL (ref 150–400)
RBC: 3.58 MIL/uL — ABNORMAL LOW (ref 3.87–5.11)
RDW: 16 % — AB (ref 11.5–15.5)
WBC: 20.8 10*3/uL — AB (ref 4.0–10.5)

## 2017-06-29 LAB — BLOOD GAS, ARTERIAL
ACID-BASE DEFICIT: 6.3 mmol/L — AB (ref 0.0–2.0)
Acid-base deficit: 9.7 mmol/L — ABNORMAL HIGH (ref 0.0–2.0)
BICARBONATE: 18 mmol/L — AB (ref 20.0–28.0)
BICARBONATE: 19.8 mmol/L — AB (ref 20.0–28.0)
DRAWN BY: 406621
Drawn by: 44166
FIO2: 40
FIO2: 40
LHR: 34 {breaths}/min
MECHVT: 450 mL
MECHVT: 500 mL
O2 SAT: 95.4 %
O2 Saturation: 91.7 %
PATIENT TEMPERATURE: 98.6
PATIENT TEMPERATURE: 98.6
PCO2 ART: 56.6 mmHg — AB (ref 32.0–48.0)
PCO2 ART: 47.5 mmHg (ref 32.0–48.0)
PEEP/CPAP: 5 cmH2O
PEEP: 8 cmH2O
PH ART: 7.244 — AB (ref 7.350–7.450)
PO2 ART: 66.2 mmHg — AB (ref 83.0–108.0)
PO2 ART: 77.1 mmHg — AB (ref 83.0–108.0)
RATE: 28 resp/min
pH, Arterial: 7.128 — CL (ref 7.350–7.450)

## 2017-06-29 LAB — CBC
HCT: 30.6 % — ABNORMAL LOW (ref 36.0–46.0)
Hemoglobin: 9 g/dL — ABNORMAL LOW (ref 12.0–15.0)
MCH: 28.1 pg (ref 26.0–34.0)
MCHC: 29.4 g/dL — ABNORMAL LOW (ref 30.0–36.0)
MCV: 95.6 fL (ref 78.0–100.0)
PLATELETS: 240 10*3/uL (ref 150–400)
RBC: 3.2 MIL/uL — AB (ref 3.87–5.11)
RDW: 15.9 % — ABNORMAL HIGH (ref 11.5–15.5)
WBC: 16.2 10*3/uL — AB (ref 4.0–10.5)

## 2017-06-29 LAB — BASIC METABOLIC PANEL
Anion gap: 11 (ref 5–15)
BUN: 43 mg/dL — ABNORMAL HIGH (ref 6–20)
CALCIUM: 7.6 mg/dL — AB (ref 8.9–10.3)
CO2: 18 mmol/L — AB (ref 22–32)
CREATININE: 2.56 mg/dL — AB (ref 0.44–1.00)
Chloride: 112 mmol/L — ABNORMAL HIGH (ref 101–111)
GFR calc Af Amer: 27 mL/min — ABNORMAL LOW (ref >60)
GFR calc non Af Amer: 23 mL/min — ABNORMAL LOW (ref >60)
Glucose, Bld: 78 mg/dL (ref 65–99)
Potassium: 4.3 mmol/L (ref 3.5–5.1)
SODIUM: 141 mmol/L (ref 135–145)

## 2017-06-29 LAB — RESPIRATORY PANEL BY PCR
Adenovirus: NOT DETECTED
BORDETELLA PERTUSSIS-RVPCR: NOT DETECTED
CORONAVIRUS 229E-RVPPCR: NOT DETECTED
CORONAVIRUS OC43-RVPPCR: NOT DETECTED
Chlamydophila pneumoniae: NOT DETECTED
Coronavirus HKU1: NOT DETECTED
Coronavirus NL63: NOT DETECTED
INFLUENZA A-RVPPCR: NOT DETECTED
INFLUENZA B-RVPPCR: NOT DETECTED
Metapneumovirus: NOT DETECTED
Mycoplasma pneumoniae: DETECTED — AB
PARAINFLUENZA VIRUS 1-RVPPCR: NOT DETECTED
Parainfluenza Virus 2: NOT DETECTED
Parainfluenza Virus 3: NOT DETECTED
Parainfluenza Virus 4: NOT DETECTED
Respiratory Syncytial Virus: NOT DETECTED
Rhinovirus / Enterovirus: NOT DETECTED

## 2017-06-29 LAB — COMPREHENSIVE METABOLIC PANEL
ALK PHOS: 42 U/L (ref 38–126)
ALT: 21 U/L (ref 14–54)
AST: 54 U/L — ABNORMAL HIGH (ref 15–41)
Albumin: 2.8 g/dL — ABNORMAL LOW (ref 3.5–5.0)
Anion gap: 11 (ref 5–15)
BUN: 41 mg/dL — AB (ref 6–20)
CO2: 18 mmol/L — ABNORMAL LOW (ref 22–32)
CREATININE: 2.56 mg/dL — AB (ref 0.44–1.00)
Calcium: 8 mg/dL — ABNORMAL LOW (ref 8.9–10.3)
Chloride: 111 mmol/L (ref 101–111)
GFR calc Af Amer: 27 mL/min — ABNORMAL LOW (ref >60)
GFR, EST NON AFRICAN AMERICAN: 23 mL/min — AB (ref >60)
Glucose, Bld: 84 mg/dL (ref 65–99)
Potassium: 4.4 mmol/L (ref 3.5–5.1)
Sodium: 140 mmol/L (ref 135–145)
Total Bilirubin: 0.9 mg/dL (ref 0.3–1.2)
Total Protein: 6.4 g/dL — ABNORMAL LOW (ref 6.5–8.1)

## 2017-06-29 LAB — STREP PNEUMONIAE URINARY ANTIGEN: STREP PNEUMO URINARY ANTIGEN: NEGATIVE

## 2017-06-29 LAB — URINALYSIS, ROUTINE W REFLEX MICROSCOPIC
BILIRUBIN URINE: NEGATIVE
Glucose, UA: NEGATIVE mg/dL
HGB URINE DIPSTICK: NEGATIVE
Ketones, ur: NEGATIVE mg/dL
Leukocytes, UA: NEGATIVE
Nitrite: NEGATIVE
PH: 5 (ref 5.0–8.0)
Protein, ur: NEGATIVE mg/dL
Specific Gravity, Urine: 1.013 (ref 1.005–1.030)

## 2017-06-29 LAB — GLUCOSE, CAPILLARY
Glucose-Capillary: 73 mg/dL (ref 65–99)
Glucose-Capillary: 84 mg/dL (ref 65–99)

## 2017-06-29 LAB — INFLUENZA PANEL BY PCR (TYPE A & B)
INFLAPCR: NEGATIVE
INFLBPCR: NEGATIVE

## 2017-06-29 LAB — LACTIC ACID, PLASMA
LACTIC ACID, VENOUS: 0.9 mmol/L (ref 0.5–1.9)
LACTIC ACID, VENOUS: 1 mmol/L (ref 0.5–1.9)

## 2017-06-29 LAB — TROPONIN I: Troponin I: <0.03 ng/mL (ref <0.03)

## 2017-06-29 LAB — I-STAT ARTERIAL BLOOD GAS, ED
ACID-BASE DEFICIT: 11 mmol/L — AB (ref 0.0–2.0)
Acid-base deficit: 10 mmol/L — ABNORMAL HIGH (ref 0.0–2.0)
BICARBONATE: 18.9 mmol/L — AB (ref 20.0–28.0)
BICARBONATE: 20.6 mmol/L (ref 20.0–28.0)
O2 SAT: 100 %
O2 Saturation: 98 %
PO2 ART: 136 mmHg — AB (ref 83.0–108.0)
Patient temperature: 98.6
TCO2: 21 mmol/L — ABNORMAL LOW (ref 22–32)
TCO2: 23 mmol/L (ref 22–32)
pCO2 arterial: 61 mmHg — ABNORMAL HIGH (ref 32.0–48.0)
pCO2 arterial: 71.2 mmHg (ref 32.0–48.0)
pH, Arterial: 7.098 — CL (ref 7.350–7.450)
pH, Arterial: 7.072 — CL (ref 7.350–7.450)
pO2, Arterial: 331 mmHg — ABNORMAL HIGH (ref 83.0–108.0)

## 2017-06-29 LAB — PHOSPHORUS: Phosphorus: 7.8 mg/dL — ABNORMAL HIGH (ref 2.5–4.6)

## 2017-06-29 LAB — PROCALCITONIN: PROCALCITONIN: 3.23 ng/mL

## 2017-06-29 LAB — MRSA PCR SCREENING: MRSA by PCR: NEGATIVE

## 2017-06-29 LAB — TRIGLYCERIDES: Triglycerides: 457 mg/dL — ABNORMAL HIGH (ref <150)

## 2017-06-29 LAB — I-STAT CG4 LACTIC ACID, ED: Lactic Acid, Venous: 0.8 mmol/L (ref 0.5–1.9)

## 2017-06-29 LAB — MAGNESIUM: Magnesium: 1.5 mg/dL — ABNORMAL LOW (ref 1.7–2.4)

## 2017-06-29 LAB — PROTIME-INR
INR: 1.33
PROTHROMBIN TIME: 16.4 s — AB (ref 11.4–15.2)

## 2017-06-29 LAB — FIBRINOGEN: Fibrinogen: 664 mg/dL — ABNORMAL HIGH (ref 210–475)

## 2017-06-29 LAB — ECHOCARDIOGRAM COMPLETE
HEIGHTINCHES: 62 in
Weight: 2518.54 oz

## 2017-06-29 MED ORDER — PANTOPRAZOLE SODIUM 40 MG IV SOLR
40.0000 mg | Freq: Every day | INTRAVENOUS | Status: DC
  Administered 2017-06-29 – 2017-06-30 (×2): 40 mg via INTRAVENOUS
  Filled 2017-06-29 (×3): qty 40

## 2017-06-29 MED ORDER — HYDROCORTISONE NA SUCCINATE PF 100 MG IJ SOLR
50.0000 mg | Freq: Four times a day (QID) | INTRAMUSCULAR | Status: DC
  Administered 2017-06-29 – 2017-07-01 (×8): 50 mg via INTRAVENOUS
  Filled 2017-06-29: qty 1
  Filled 2017-06-29: qty 2
  Filled 2017-06-29 (×8): qty 1

## 2017-06-29 MED ORDER — LEVOFLOXACIN IN D5W 500 MG/100ML IV SOLN: 500.0000 mg | INTRAVENOUS | Status: DC

## 2017-06-29 MED ORDER — CHLORHEXIDINE GLUCONATE CLOTH 2 % EX PADS
6.0000 | MEDICATED_PAD | Freq: Every day | CUTANEOUS | Status: DC
  Administered 2017-06-29 – 2017-06-30 (×2): 6 via TOPICAL

## 2017-06-29 MED ORDER — DEXTROSE 5 % IV SOLN
80.0000 mg | Freq: Two times a day (BID) | INTRAVENOUS | Status: DC
  Filled 2017-06-29: qty 1.6

## 2017-06-29 MED ORDER — VASOPRESSIN 20 UNIT/ML IV SOLN
0.0300 [IU]/min | INTRAVENOUS | Status: DC
  Filled 2017-06-29 (×2): qty 2

## 2017-06-29 MED ORDER — CEFEPIME HCL 2 G IJ SOLR
2.0000 g | Freq: Once | INTRAMUSCULAR | Status: AC
  Administered 2017-06-29: 2 g via INTRAVENOUS
  Filled 2017-06-29: qty 2

## 2017-06-29 MED ORDER — FENTANYL CITRATE (PF) 100 MCG/2ML IJ SOLN
100.0000 ug | INTRAMUSCULAR | Status: DC | PRN
  Administered 2017-06-29 – 2017-06-30 (×4): 100 ug via INTRAVENOUS
  Administered 2017-06-30: 50 ug via INTRAVENOUS
  Administered 2017-07-01 (×3): 100 ug via INTRAVENOUS
  Administered 2017-07-01: 50 ug via INTRAVENOUS

## 2017-06-29 MED ORDER — MIDAZOLAM HCL 2 MG/2ML IJ SOLN
INTRAMUSCULAR | Status: DC | PRN
  Administered 2017-06-29: 2 mg via INTRAVENOUS

## 2017-06-29 MED ORDER — MIDAZOLAM HCL 2 MG/2ML IJ SOLN
INTRAMUSCULAR | Status: AC
  Administered 2017-06-29: 2 mg
  Filled 2017-06-29: qty 2

## 2017-06-29 MED ORDER — FENTANYL CITRATE (PF) 100 MCG/2ML IJ SOLN
INTRAMUSCULAR | Status: AC | PRN
  Administered 2017-06-29: 100 ug via INTRAVENOUS

## 2017-06-29 MED ORDER — SODIUM BICARBONATE 8.4 % IV SOLN
INTRAVENOUS | Status: AC
  Filled 2017-06-29: qty 150

## 2017-06-29 MED ORDER — SODIUM CHLORIDE 0.9% FLUSH
10.0000 mL | INTRAVENOUS | Status: DC | PRN
  Administered 2017-07-03: 10 mL
  Filled 2017-06-29: qty 40

## 2017-06-29 MED ORDER — MIDAZOLAM HCL 2 MG/2ML IJ SOLN: 2.0000 mg | INTRAMUSCULAR | Status: DC | PRN

## 2017-06-29 MED ORDER — SUCCINYLCHOLINE CHLORIDE 20 MG/ML IJ SOLN
100.0000 mg | Freq: Once | INTRAMUSCULAR | Status: AC
  Administered 2017-06-29: 100 mg via INTRAVENOUS
  Filled 2017-06-29: qty 5

## 2017-06-29 MED ORDER — PROPOFOL 1000 MG/100ML IV EMUL
0.0000 ug/kg/min | INTRAVENOUS | Status: DC
  Administered 2017-06-29 (×2): 10 ug/kg/min via INTRAVENOUS
  Filled 2017-06-29 (×3): qty 100

## 2017-06-29 MED ORDER — CHLORHEXIDINE GLUCONATE 0.12% ORAL RINSE (MEDLINE KIT)
15.0000 mL | Freq: Two times a day (BID) | OROMUCOSAL | Status: DC
  Administered 2017-06-29 – 2017-07-01 (×5): 15 mL via OROMUCOSAL

## 2017-06-29 MED ORDER — MAGNESIUM SULFATE 2 GM/50ML IV SOLN
2.0000 g | Freq: Once | INTRAVENOUS | Status: AC
  Administered 2017-06-29: 2 g via INTRAVENOUS
  Filled 2017-06-29: qty 50

## 2017-06-29 MED ORDER — SODIUM BICARBONATE 8.4 % IV SOLN
INTRAVENOUS | Status: DC
  Administered 2017-06-29 – 2017-06-30 (×6): via INTRAVENOUS
  Filled 2017-06-29 (×8): qty 150

## 2017-06-29 MED ORDER — FENTANYL 2500MCG IN NS 250ML (10MCG/ML) PREMIX INFUSION
0.0000 ug/h | INTRAVENOUS | Status: DC
  Administered 2017-06-29: 100 ug/h via INTRAVENOUS
  Administered 2017-06-30: 75 ug/h via INTRAVENOUS
  Administered 2017-07-01: 175 ug/h via INTRAVENOUS
  Filled 2017-06-29 (×4): qty 250

## 2017-06-29 MED ORDER — ETOMIDATE 2 MG/ML IV SOLN
20.0000 mg | Freq: Once | INTRAVENOUS | Status: AC
  Administered 2017-06-29: 20 mg via INTRAVENOUS

## 2017-06-29 MED ORDER — DEXTROSE 5 % IV SOLN
1.0000 g | INTRAVENOUS | Status: DC
  Administered 2017-06-29 – 2017-07-04 (×6): 1 g via INTRAVENOUS
  Filled 2017-06-29 (×7): qty 1

## 2017-06-29 MED ORDER — MIDAZOLAM HCL 2 MG/2ML IJ SOLN
INTRAMUSCULAR | Status: AC
  Filled 2017-06-29: qty 2

## 2017-06-29 MED ORDER — VANCOMYCIN HCL IN DEXTROSE 750-5 MG/150ML-% IV SOLN
750.0000 mg | INTRAVENOUS | Status: DC
  Administered 2017-06-29 – 2017-06-30 (×2): 750 mg via INTRAVENOUS
  Filled 2017-06-29 (×2): qty 150

## 2017-06-29 MED ORDER — LACTATED RINGERS IV BOLUS (SEPSIS)
1000.0000 mL | Freq: Once | INTRAVENOUS | Status: AC
  Administered 2017-06-29: 1000 mL via INTRAVENOUS

## 2017-06-29 MED ORDER — NOREPINEPHRINE BITARTRATE 1 MG/ML IV SOLN
0.0000 ug/min | INTRAVENOUS | Status: DC
  Administered 2017-06-29: 2 ug/min via INTRAVENOUS
  Filled 2017-06-29 (×3): qty 4

## 2017-06-29 MED ORDER — AZITHROMYCIN 500 MG IV SOLR
500.0000 mg | INTRAVENOUS | Status: DC
  Administered 2017-06-29 – 2017-07-05 (×7): 500 mg via INTRAVENOUS
  Filled 2017-06-29 (×7): qty 500

## 2017-06-29 MED ORDER — ORAL CARE MOUTH RINSE
15.0000 mL | Freq: Four times a day (QID) | OROMUCOSAL | Status: DC
  Administered 2017-06-29 – 2017-07-01 (×10): 15 mL via OROMUCOSAL

## 2017-06-29 MED ORDER — HEPARIN SODIUM (PORCINE) 5000 UNIT/ML IJ SOLN
5000.0000 [IU] | Freq: Three times a day (TID) | INTRAMUSCULAR | Status: DC
  Administered 2017-06-29 – 2017-07-05 (×18): 5000 [IU] via SUBCUTANEOUS
  Filled 2017-06-29 (×21): qty 1

## 2017-06-29 MED ORDER — VANCOMYCIN HCL IN DEXTROSE 1-5 GM/200ML-% IV SOLN
1000.0000 mg | Freq: Once | INTRAVENOUS | Status: AC
  Administered 2017-06-29: 1000 mg via INTRAVENOUS
  Filled 2017-06-29: qty 200

## 2017-06-29 MED ORDER — SODIUM CHLORIDE 0.9 % IV SOLN
250.0000 mL | INTRAVENOUS | Status: DC | PRN
  Administered 2017-07-04: 250 mL via INTRAVENOUS

## 2017-06-29 MED ORDER — DEXTROSE 5 % IV SOLN
80.0000 mg | Freq: Two times a day (BID) | INTRAVENOUS | Status: DC
  Administered 2017-06-29 – 2017-06-30 (×2): 80 mg via INTRAVENOUS
  Filled 2017-06-29 (×3): qty 1.6

## 2017-06-29 MED ORDER — MIDAZOLAM HCL 2 MG/2ML IJ SOLN
2.0000 mg | INTRAMUSCULAR | Status: DC | PRN
  Administered 2017-07-01: 2 mg via INTRAVENOUS
  Filled 2017-06-29: qty 2

## 2017-06-29 MED ORDER — FENTANYL CITRATE (PF) 100 MCG/2ML IJ SOLN
100.0000 ug | INTRAMUSCULAR | Status: AC | PRN
  Administered 2017-06-29 (×3): 100 ug via INTRAVENOUS

## 2017-06-29 NOTE — ED Provider Notes (Signed)
[facility] EMERGENCY DEPARTMENT Provider Note   CSN: [id number] Arrival date & time: 06/29/17  0009     History   Chief Complaint Chief Complaint  Patient presents with  . Cough  . Abnormal Lab    WBC 20,000    HPI Donna Shannon is a 31 y.o. female.  Patient with 1 week history of cough, chest congestion.  She thought she had bronchitis.  She went to urgent care today and they thought she might have pneumonia on her x-ray.  She was sent to [facility] ER to be evaluated for possible sepsis.  They did confirm right lower lobe pneumonia and try to admit her.  Apparently the admitting service there declined her because she is a renal transplant and thought she needed to go to a higher level of care.  The ER doctor then talked to the hospitalist here at [facility], while trying to arrange for direct admission, patient's father reportedly got an ambulance (he states that he is a Engineer, structural) and brought her directly to [facility].  She arrives unannounced.       Past Medical History:  Diagnosis Date  . Anemia   . Anxiety   . Arthritis    "left knee" (04/24/2016)  . Back pain   . Chronic lower back pain   . Constipation   . Depression   . Edema   . Essential hypertension   . Factor V Leiden (HCC)    [name] 04/24/2016  . Frequent UTI    [name] 04/24/2016  . GERD (gastroesophageal reflux disease)   . Gout   . High cholesterol   . HTN (hypertension)   . Kidney disease   . Migraine    "weekly" (04/24/2016)  . Neurogenic bladder    augmented bladder, I/O self caths  . On home oxygen therapy    "2L; every night" (04/24/2016)  . Osteoporosis   . Ovarian cyst dx'd 04/22/2016   left  . Palpitations   . Pneumonia 03/2016   [name] 04/24/2016  . Renal transplant failure and rejection    age 45-11  . 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 Seizures (HCC)   . Self-catheterizes  urinary bladder    "~ 12 X/day" (04/24/2016)  . SOB (shortness of breath)   . Stroke [facility]) 1991   denies residual on 04/24/2016  . Vitamin D deficiency     Patient Active Problem List   Diagnosis Date Noted  . Seizure (HCC) 02/24/2017  . Acute respiratory failure with hypoxia and hypercapnia (HCC)   . Somnolence   . AKI (acute kidney injury) (HCC)   . Ovarian abscess   . ARDS (adult respiratory distress syndrome) (HCC) 02/22/2017  . Wound drainage 02/21/2017  . ADD (attention deficit disorder) 01/22/2017  . GERD (gastroesophageal reflux disease) 01/22/2017  . Obstruction of fallopian tube 12/06/2016  . Class 1 obesity with serious comorbidity and body mass index (BMI) of 30.0 to 30.9 in adult 11/29/2016  . Class 1 obesity without serious comorbidity with body mass index (BMI) of 30.0 to 30.9 in adult 11/14/2016  . Nocturnal hypoxemia 09/24/2016  . Obesity (BMI 30.0-34.9) 09/10/2016  . 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 Vitamin D deficiency 09/10/2016  . Chronic intractable headache   . Acute on chronic diastolic heart failure (HCC)   . Headache 07/11/2016  . Intractable headache 07/11/2016  . 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 Hypoxemia 06/25/2016  .  Intractable chronic migraine without aura and with status migrainosus 06/25/2016  . Intracranial hypertension   . HA (headache)   . Stage 3 chronic kidney disease (HCC)   . Chronic diastolic heart failure (HCC)   . OSA (obstructive sleep apnea)   . Obesity 04/25/2016  . Kidney transplant status, living related donor 04/25/2016  . Depression 04/25/2016  . Neurogenic bladder 04/25/2016  . Hypertensive disorder 04/25/2016  . CKD (chronic kidney disease) stage 2, GFR 60-89 ml/min 04/25/2016  . Volume overload 04/24/2016  . Psychophysiological insomnia 05/04/2015  . Senile osteoporosis 02/17/2015  . Hypertriglyceridemia 05/31/2014  . Anemia, chronic disease 04/29/2014  . Migraine headache 04/21/2014  . Urgency incontinence  03/18/2014  . Immunosuppression (HCC) 11/20/2013  . Abdominal pain 12/22/2011  . Chronic interstitial cystitis 12/22/2011  . Factor V deficiency (HCC) 04/30/2011  . Long-term use of immunosuppressant medication 04/30/2011    Past Surgical History:  Procedure Laterality Date  . ABDOMINAL HERNIA REPAIR  "several; when I was little"  . allograph biopsy  2013   [name] 04/24/2016  . HERNIA REPAIR    . KIDNEY TRANSPLANT  1996; 1998   father was donor; mother was donor/notes 04/24/2016  . PACEMAKER INSERTION  "?early 2000s"   for bladder function  . PARTIAL NEPHRECTOMY  1990s X 5   "removing disease"  . PORTACATH PLACEMENT Right 11/2015  . RIGHT OOPHORECTOMY Right 2001   ovarian torsion    OB History    Gravida Para Term Preterm AB Living   0 0 0 0 0 0   SAB TAB Ectopic Multiple Live Births   0 0 0 0 0       Home Medications    Prior to Admission medications   Medication Sig Start Date End Date Taking? Authorizing Provider  acetaminophen-codeine (TYLENOL #4) 300-60 MG tablet  02/11/17   [provider]  ALPRAZolam (XANAX) 0.5 MG tablet Take 1 tablet (0.5 mg total) by mouth 3 (three) times daily as needed for anxiety. May take 1-2 30-60 minutes before LP. Do not drive. 03/12/17   Anson Fret, MD  Amino Acids-Protein Hydrolys (FEEDING SUPPLEMENT, PRO-STAT SUGAR FREE 64,) LIQD Place 30 mLs into feeding tube 2 (two) times daily. 02/23/17   Parrett, Virgel Bouquet, NP  anidulafungin 100 mg in sodium chloride 0.9 % 100 mL Inject 100 mg into the vein daily. 02/23/17   Parrett, Virgel Bouquet, NP  clonazePAM (KLONOPIN) 0.5 MG tablet  02/18/17   [provider]  escitalopram (LEXAPRO) 20 MG tablet  01/30/17   [provider]  flavoxATE (URISPAS) 100 MG tablet Take 100 mg by mouth. 12/26/16   [provider]  furosemide (LASIX) 40 MG tablet  01/10/17   [provider]  gabapentin (NEURONTIN) 300 MG capsule  01/14/17   [provider]  heparin 5000 UNIT/ML  injection Inject 1 mL (5,000 Units total) into the skin every 8 (eight) hours. 02/23/17   Parrett, Virgel Bouquet, NP  HYDROcodone-acetaminophen (NORCO) 10-325 MG tablet Take 1 tablet by mouth every 6 (six) hours as needed. for pain 02/04/17   [provider]  levETIRAcetam (KEPPRA) 500 MG tablet Take 500 mg by mouth. 03/01/17   [provider]  levETIRAcetam 500 mg in sodium chloride 0.9 % 100 mL Inject 500 mg into the vein every 12 (twelve) hours. 02/23/17   Parrett, Virgel Bouquet, NP  levofloxacin (LEVAQUIN) 500 MG/100ML SOLN Inject 100 mLs (500 mg total) into the vein every other day. 02/24/17   Parrett, Babette Relic  S, NP  lidocaine (XYLOCAINE) 2 % jelly 2 (two) times daily. 12/25/16   [provider]  meropenem 500 mg in sodium chloride 0.9 % 50 mL Inject 500 mg into the vein daily. 02/23/17   Parrett, Virgel Bouquetammy S, NP  methylphenidate (RITALIN) 10 MG tablet Take 10 mg by mouth. 12/26/16   [provider]  metoprolol tartrate (LOPRESSOR) 50 MG tablet Take 25 mg by mouth. 07/11/16   [provider]  mycophenolate (CELLCEPT) 500 MG tablet TAKE 1 TABLET BY MOUTH TWICE A DAY 12/11/16   [provider]  MYRBETRIQ 50 MG TB24 tablet  01/14/17   [provider]  norepinephrine 4 mg in dextrose 5 % 250 mL Inject 0-40 mcg/min into the vein continuous. 02/23/17   Parrett, Virgel Bouquetammy S, NP  ondansetron (ZOFRAN) 8 MG tablet Take 8 mg by mouth.    [provider]  pantoprazole (PROTONIX) 40 MG injection Inject 40 mg into the vein at bedtime. 02/23/17   Parrett, Virgel Bouquetammy S, NP  PARoxetine (PAXIL) 10 MG tablet  02/15/17   [provider]  potassium chloride SA (K-DUR,KLOR-CON) 20 MEQ tablet  12/22/16   [provider]  predniSONE (DELTASONE) 2.5 MG tablet  02/12/17   [provider]  promethazine (PHENERGAN) 25 MG suppository UNWRAP AND INSERT 1 SUPPOSITORY RECTALLY EVERY 6 HOURS AS NEEDED FOR NAUSEA OR VOMITING 06/25/17   Anson FretAhern, Antonia B, MD  ranitidine  (ZANTAC) 75 MG tablet Take 75 mg by mouth.    [provider]  sertraline (ZOLOFT) 100 MG tablet  01/21/17   [provider]  sodium chloride 0.9 % infusion Inject 250 mLs into the vein as needed (if IV carrier fluid needed.). 02/23/17   Parrett, Tammy S, NP  sodium chloride 0.9 % infusion Inject 10 mLs into the vein continuous. 02/23/17   Parrett, Virgel Bouquetammy S, NP  traMADol Janean Sark(ULTRAM) 50 MG tablet  02/14/17   [provider]  VASCEPA 1 g CAPS  02/11/17   [provider]    Family History Family History  Problem Relation Age of Onset  . Hypertension Mother   . CAD Father        s/p CABG  . Hyperlipidemia Father   . Heart disease Father   . Thyroid disease Father     Social History Social History   Tobacco Use  . Smoking status: Former Smoker    Packs/day: 0.50    Years: 4.00    Pack years: 2.00    Types: Cigarettes    Last attempt to quit: 07/09/2014    Years since quitting: 2.9  . Smokeless tobacco: Never Used  Substance Use Topics  . Alcohol use: Yes    Comment: 04/24/2016 "might have 2 drinks/month, if that"  . Drug use: No     Allergies   Sulfa antibiotics   Review of Systems Review of Systems  Constitutional: Positive for fatigue.  Respiratory: Positive for cough and shortness of breath.   All other systems reviewed and are negative.    Physical Exam Updated Vital Signs BP 105/68 (BP Location: Right Arm)   Pulse (!) 116   Temp 99 F (37.2 C) (Rectal)   Resp (!) 22   SpO2 100%   Physical Exam  Constitutional: She is oriented to person, place, and time. She appears well-developed and well-nourished. She appears listless. No distress.  HENT:  Head: Normocephalic and atraumatic.  Right Ear: Hearing normal.  Left Ear: Hearing normal.  Nose: Nose normal.  Mouth/Throat: Oropharynx  is clear and moist and mucous membranes are normal.  Eyes: Conjunctivae and EOM are normal. Pupils are equal, round, and reactive to light.  Neck: Normal  range of motion. Neck supple.  Cardiovascular: Regular rhythm, S1 normal and S2 normal. Tachycardia present. Exam reveals no gallop and no friction rub.  No murmur heard. Pulmonary/Chest: Effort normal. No respiratory distress. She has decreased breath sounds. She has rhonchi. She exhibits no tenderness.  Abdominal: Soft. Normal appearance and bowel sounds are normal. There is no hepatosplenomegaly. There is no tenderness. There is no rebound, no guarding, no tenderness at McBurney's point and negative Murphy's sign. No hernia.  Musculoskeletal: Normal range of motion.  Neurological: She is oriented to person, place, and time. She has normal strength. She appears listless. No cranial nerve deficit or sensory deficit. Coordination normal. GCS eye subscore is 4. GCS verbal subscore is 5. GCS motor subscore is 6.  Skin: Skin is warm, dry and intact. No rash noted. No cyanosis.  Psychiatric: She has a normal mood and affect. Her speech is normal and behavior is normal. Thought content normal.  Nursing note and vitals reviewed.    ED Treatments / Results  Labs (all labs ordered are listed, but only abnormal results are displayed) Labs Reviewed  CULTURE, BLOOD (ROUTINE X 2)  CULTURE, BLOOD (ROUTINE X 2)  URINE CULTURE  COMPREHENSIVE METABOLIC PANEL  CBC WITH DIFFERENTIAL/PLATELET  URINALYSIS, ROUTINE W REFLEX MICROSCOPIC  I-STAT CG4 LACTIC ACID, ED  I-STAT ARTERIAL BLOOD GAS, ED    EKG  EKG Interpretation None       Radiology No results found.  Procedures Procedure Name: Intubation Date/Time: 06/29/2017 2:24 AM Performed by: Gilda CreasePollina, Christopher J, MD Pre-anesthesia Checklist: Patient identified, Patient being monitored, Emergency Drugs available, Timeout performed and Suction available Oxygen Delivery Method: Non-rebreather mask Preoxygenation: Pre-oxygenation with 100% oxygen Induction Type: Rapid sequence Ventilation: Mask ventilation without difficulty Laryngoscope Size:  Glidescope and 3 Grade View: Grade II Tube size: 7.5 mm Number of attempts: 1 Placement Confirmation: ETT inserted through vocal cords under direct vision,  CO2 detector and Breath sounds checked- equal and bilateral Secured at: 24 cm Tube secured with: ETT holder Dental Injury: Teeth and Oropharynx as per pre-operative assessment  Future Recommendations: Recommend- induction with short-acting agent, and alternative techniques readily available      (including critical care time)  Medications Ordered in ED Medications  ceFEPIme (MAXIPIME) 2 g in dextrose 5 % 50 mL IVPB (not administered)  vancomycin (VANCOCIN) IVPB 1000 mg/200 mL premix (not administered)     Initial Impression / Assessment and Plan / ED Course  I have reviewed the triage vital signs and the nursing notes.  Pertinent labs & imaging results that were available during my care of the patient were reviewed by me and considered in my medical decision making (see chart for details).     Patient brought to [facility] emergency department by father for further treatment.  She has been sick for a week with cough and chest congestion.  Father reports a similar illness in August of this year requiring intubation.  She presented to urgent care earlier today for evaluation of cough and chest congestion which has progressively worsened.  The told her that they thought she had pneumonia and possibly sepsis, sent her to [facility].  She was diagnosed with pneumonia there, likely early sepsis, administered 2 L of IV fluids and IV Levaquin.  They tried to get her admitted to the hospital,  but there was apparently hesitance from the hospitalist service because of her elevated creatinine in the setting of renal transplant.  The emergency department attempted to get her transferred to the hospitalist service here at [facility], but when there was no bed, father obtained an ambulance and drove her  directly to the ER here.  Upon arrival to the emergency department, she appears to be exhibiting some respiratory distress.  She is exhibiting increased work of breathing and she is somnolent.  Reviewing records from previous hospital revealed that she did not have CO2 retention on previous blood gas.  Blood gas was repeated and she is exhibiting acute CO2 retention with acidosis.  As she is struggling to breathe, I do not believe that BiPAP will reverse this acute respiratory acidosis and respiratory failure, I have recommended intubation.  She and her mother agree with this.  She was intubated without difficulty.  Patient has a significant leukocytosis but lactic acid is not elevated.  She had received 30 mL/kg IV fluid at [facility] prior to arrival in the ER.  She had also received Levaquin, was administered cefepime and vancomycin for more broad-spectrum coverage.  Her chest x-ray here is not clearly convincing for pneumonia, however she has a persistent cough with diffuse rales and rhonchi on examination.  Reviewing records reveals her previous hospitalization was very similar and she was treated for ARDS.  Records also indicate a history of factor V Leiden, she is not anticoagulated.  Creatinine is 2.56 and she is a renal transplant, cannot have CT angio.  With her presentation and lung examination, I am not suspicious for PE.  She will be admitted to the ICU.  CRITICAL CARE Performed by: Gilda Crease   Total critical care time: 30 minutes  Critical care time was exclusive of separately billable procedures and treating other patients.  Critical care was necessary to treat or prevent imminent or life-threatening deterioration.  Critical care was time spent personally by me on the following activities: development of treatment plan with patient and/or surrogate as well as nursing, discussions with consultants, evaluation of patient's response to treatment,  examination of patient, obtaining history from patient or surrogate, ordering and performing treatments and interventions, ordering and review of laboratory studies, ordering and review of radiographic studies, pulse oximetry and re-evaluation of patient's condition.   Final Clinical Impressions(s) / ED Diagnoses   Final diagnoses:  None    ED Discharge Orders    None       Pollina, Canary Brim, MD 06/29/17 [phone]

## 2017-06-29 NOTE — Progress Notes (Addendum)
Initial Nutrition Assessment  DOCUMENTATION CODES:   Not applicable  INTERVENTION:   If unable to extubate in the next 24 hours, recommend start TF:  Vital AF 1.2 at 65 ml/h (1560 ml per day)  Provides 1872 kcal, 117 gm protein, 1265 ml free water daily  NUTRITION DIAGNOSIS:   Inadequate oral intake related to inability to eat as evidenced by NPO status.  GOAL:   Patient will meet greater than or equal to 90% of their needs  MONITOR:   Vent status, Labs, I & O's  REASON FOR ASSESSMENT:   Ventilator    ASSESSMENT:   31 yo female with PMH of renal transplant, neurogenic bladder, HTN, frequent UTI, factor V Leiden deficiency, HLD, PNA, 2 L oxygen at night, stroke, migraines, anxiety, depression, osteoporosis, GERD, vitamin D deficiency, seizures who was admitted on 12/22 with hypoxia/ARDS, requiring intubation in the ED.   Patient is currently intubated on ventilator support MV: 17.8 L/min Temp (24hrs), Avg:98.8 F (37.1 C), Min:98.4 F (36.9 C), Max:99 F (37.2 C)  Propofol: currently off  Labs reviewed. Phosphorus 7.8 (H), magnesium 1.5 (L), triglycerides 457 (H) Medications reviewed and include mag sulfate and sodium bicarb. Nephrology following; she may require CRRT.  NUTRITION - FOCUSED PHYSICAL EXAM:    Most Recent Value  Orbital Region  No depletion  Upper Arm Region  No depletion  Thoracic and Lumbar Region  Unable to assess  Buccal Region  No depletion  Temple Region  No depletion  Clavicle Bone Region  No depletion  Clavicle and Acromion Bone Region  No depletion  Scapular Bone Region  Unable to assess  Dorsal Hand  No depletion  Patellar Region  No depletion  Anterior Thigh Region  No depletion  Posterior Calf Region  No depletion  Edema (RD Assessment)  Mild  Hair  Reviewed  Eyes  Unable to assess  Mouth  Unable to assess  Skin  Reviewed  Nails  Reviewed       Diet Order:  Diet NPO time specified  EDUCATION NEEDS:   No education  needs have been identified at this time  Skin:  Skin Assessment: Reviewed RN Assessment  Last BM:  PTA  Height:   Ht Readings from Last 1 Encounters:  06/29/17 5\' 2"  (1.575 m)    Weight:   Wt Readings from Last 1 Encounters:  06/29/17 157 lb 6.5 oz (71.4 kg)    Ideal Body Weight:  50 kg  BMI:  Body mass index is 28.79 kg/m.  Estimated Nutritional Needs:   Kcal:  1875  Protein:  95-110 gm  Fluid:  1.8 L   Joaquin CourtsKimberly Niurka Benecke, RD, LDN, CNSC Pager [phone] After Hours Pager [phone]

## 2017-06-29 NOTE — Progress Notes (Signed)
  Echocardiogram 2D Echocardiogram has been performed.  Leta JunglingCooper, Trany Chernick M 06/29/2017, 11:36 AM

## 2017-06-29 NOTE — Consult Note (Signed)
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  She was just recently admitted to the hospital in August with ARDS and at that time, appears that she had acute on chronic renal insufficiency with a creatinine of 4.1 which improved to about 1.5 when seen in November by Dr. Moshe Cipro who follows her as an outpatient for ongoing nephrology care.  She was brought to the emergency room by her father from [place] (originally evaluated at the emergency room there and diagnosed as to having bronchitis but he signed out AMA and transported her here on [place] in his fire truck).  She was found to be short of breath and profoundly hypoxic requiring intubation in the emergency room.  Earlier labs done overnight are significant for acute on chronic renal insufficiency with a creatinine of 2.6, mixed respiratory and metabolic acidosis and chest x-ray as well as CT scan of the chest showing diffuse bilateral airspace opacification with concerns for ARDS versus pneumonia/flash pulmonary edema.  Past Medical History:  Diagnosis Date  . Anemia   . Anxiety   . Arthritis    "left knee" (04/24/2016)  . Back pain   . Chronic lower back pain   . Constipation   . Depression   . Edema   . Essential hypertension   . Factor V Leiden ([place])    Archie Endo 04/24/2016  . Frequent UTI    Archie Endo 04/24/2016  . GERD  (gastroesophageal reflux disease)   . Gout   . High cholesterol   . HTN (hypertension)   . Kidney disease   . Migraine    "weekly" (04/24/2016)  . Neurogenic bladder    augmented bladder, I/O self caths  . On home oxygen therapy    "2L; every night" (04/24/2016)  . Osteoporosis   . Ovarian cyst dx'd 04/22/2016   left  . Palpitations   . Pneumonia 03/2016   Archie Endo 04/24/2016  . Renal transplant failure and rejection    age 49-11  . 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 Seizures ([place])   . Self-catheterizes urinary bladder    "~ 12 X/day" (04/24/2016)  . SOB (shortness of breath)   . Stroke [facility]) 1991   denies residual on 04/24/2016  . Vitamin D deficiency     Past Surgical History:  Procedure Laterality Date  . ABDOMINAL HERNIA REPAIR  "several; when I was little"  . allograph biopsy  2013   Archie Endo 04/24/2016  . HERNIA REPAIR    . [place]; 1998   father was donor; mother was donor/notes 04/24/2016  . PACEMAKER INSERTION  "?early 2000s"   for bladder function  . PARTIAL NEPHRECTOMY  1990s X 5   "removing disease"  . PORTACATH PLACEMENT Right 11/2015  . RIGHT OOPHORECTOMY Right 2001   ovarian torsion    Family History  Problem Relation Age of Onset  . Hypertension Mother   . CAD Father  s/p CABG  . Hyperlipidemia Father   . Heart disease Father   . Thyroid disease Father     Social History:  reports that she quit smoking about 2 years ago. Her smoking use included cigarettes. She has a 2.00 pack-year smoking history. she has never used smokeless tobacco. She reports that she drinks alcohol. She reports that she does not use drugs.  Allergies:  Allergies  Allergen Reactions  . Phenazopyridine Other (See Comments)    Caused liver functions to go down and jaundice   . Vancomycin Itching  . Versed [Midazolam] Itching  . Sulfa Antibiotics Itching    Medications:  Scheduled: .  chlorhexidine gluconate (MEDLINE KIT)  15 mL Mouth Rinse BID  . Chlorhexidine Gluconate Cloth  6 each Topical Daily  . heparin  5,000 Units Subcutaneous Q8H  . hydrocortisone sod succinate (SOLU-CORTEF) inj  50 mg Intravenous Q6H  . mouth rinse  15 mL Mouth Rinse QID  . pantoprazole (PROTONIX) IV  40 mg Intravenous QHS  . sodium bicarbonate        BMP Latest Ref Rng & Units 06/29/2017 06/29/2017 02/23/2017  Glucose 65 - 99 mg/dL 78 84 113(H)  BUN 6 - 20 mg/dL 43(H) 41(H) 62(H)  Creatinine 0.44 - 1.00 mg/dL 2.56(H) 2.56(H) 4.14(H)  BUN/Creat Ratio 9 - 23 - - -  Sodium 135 - 145 mmol/L 141 140 133(L)  Potassium 3.5 - 5.1 mmol/L 4.3 4.4 5.6(H)  Chloride 101 - 111 mmol/L 112(H) 111 104  CO2 22 - 32 mmol/L 18(L) 18(L) 17(L)  Calcium 8.9 - 10.3 mg/dL 7.6(L) 8.0(L) 8.2(L)   CBC Latest Ref Rng & Units 06/29/2017 06/29/2017 02/23/2017  WBC 4.0 - 10.5 K/uL 16.2(H) 20.8(H) 10.3  Hemoglobin 12.0 - 15.0 g/dL 9.0(L) 10.1(L) 6.7(LL)  Hematocrit 36.0 - 46.0 % 30.6(L) 33.9(L) 23.1(L)  Platelets 150 - 400 K/uL 240 279 267     Ct Chest Wo Contrast  Result Date: 06/29/2017 CLINICAL DATA:  Subacute onset of cough.  Sepsis. EXAM: CT CHEST WITHOUT CONTRAST TECHNIQUE: Multidetector CT imaging of the chest was performed following the standard protocol without IV contrast. COMPARISON:  CT of the chest performed 02/22/2017 FINDINGS: Cardiovascular: The heart is normal in size. A right-sided chest port is noted ending about the right ventricle. The thoracic aorta is grossly unremarkable. The great vessels are within normal limits. Mediastinum/Nodes: Trace pericardial fluid remains within normal limits. No definite mediastinal lymphadenopathy is seen. The patient's endotracheal tube is seen ending 2 cm above the carina. An enteric tube is noted extending into the stomach. The visualized portions of the thyroid gland are unremarkable. No axillary lymphadenopathy is seen. Lungs/Pleura: Diffuse bilateral airspace  opacification is noted, more dense at the lower lung lobes, and particularly hazy at the upper lung lobes, concerning for ARDS. Underlying pneumonia or flash pulmonary edema cannot be excluded. No pleural effusion or pneumothorax is seen. Upper Abdomen: The visualized portions of the liver and spleen are unremarkable. The visualized portions of the gallbladder and pancreas are within normal limits. Musculoskeletal: No acute osseous abnormalities are identified. The visualized musculature is unremarkable in appearance. IMPRESSION: Diffuse bilateral airspace opacification, more dense at the lower lung lobes, concerning for ARDS. Underlying pneumonia or flash pulmonary edema cannot be excluded. Electronically Signed   By: Garald Balding M.D.   On: 06/29/2017 04:11   Dg Chest Portable 1 View  Result Date: 06/29/2017 CLINICAL DATA:  31 year old female status post intubation. EXAM: PORTABLE CHEST 1 VIEW COMPARISON:  Earlier Chest radiograph dated  06/29/2017 FINDINGS: There has been interval placement of an endotracheal tube with tip approximately 2 cm above the carina. Right-sided Port-A-Cath in similar positioning as before. Enteric tube with sideport over the stomach and tip beyond the inferior margin of the image. Diffuse bilateral, left greater right, and lower lobe predominant airspace opacities again noted most likely representing ARDS. Probable small pleural effusions. No pneumothorax. Stable cardiac silhouette. No acute osseous pathology. Surgical clips noted over the upper abdomen. IMPRESSION: 1. Interval placement of an endotracheal tube with tip approximately 2 cm above the carina. Enteric tube extends into the stomach. 2. Interval worsening of the bilateral airspace opacity compared to the earlier radiograph. Electronically Signed   By: Anner Crete M.D.   On: 06/29/2017 02:29   Dg Chest Port 1 View  Result Date: 06/29/2017 CLINICAL DATA:  Cough and bronchitis EXAM: PORTABLE CHEST 1 VIEW  COMPARISON:  Chest radiograph 02/23/2017 FINDINGS: Dual-lumen right chest wall Port-A-Cath tip is in the right atrium. There are left-greater-than-right bibasilar opacities. No pleural effusion or pneumothorax. Mild cardiomegaly. Chronic peribronchial thickening. IMPRESSION: Mild cardiomegaly and peribronchial thickening consistent with chronic bronchitis. Electronically Signed   By: Ulyses Jarred M.D.   On: 06/29/2017 01:11    Review of Systems  Unable to perform ROS: Intubated   Blood pressure (!) 86/57, pulse (!) 107, temperature 98.4 F (36.9 C), temperature source Axillary, resp. rate (!) 30, height '5\' 2"'  (1.575 m), weight 71.4 kg (157 lb 6.5 oz), last menstrual period 06/08/2017, SpO2 96 %. Physical Exam  Nursing note and vitals reviewed. Constitutional: She appears well-developed and well-nourished.  Intubated, appears comfortable  HENT:  Head: Normocephalic and atraumatic.  Eyes: Pupils are equal, round, and reactive to light. No scleral icterus.  Neck: Normal range of motion. No JVD present. No thyromegaly present.  Cardiovascular: Regular rhythm. Exam reveals no friction rub.  Murmur heard. Regular tachycardia, 3/6 ejection systolic murmur  Respiratory: She has no wheezes. She has no rales.  Anteriorly coarse/mechanical breath sounds.  GI: Soft. Bowel sounds are normal. There is no tenderness. There is no rebound.  Musculoskeletal: She exhibits edema.  Trace edema over lower extremities, multiple tattoos  Skin: Skin is warm and dry. No erythema.    Assessment/Plan: 1.  Acute hypoxic/hypercarbic respiratory failure: Initial imaging as well as oxygen gradient pointing to ARDS, currently on ventilator support with ongoing management by CCM.  Testing for influenza is negative and respiratory panel pending. 2.  SIRS/sepsis: High index of suspicion that this is of respiratory etiology, on pressors and started on broad-spectrum antimicrobial coverage with cefepime and vancomycin. 3.   Acute kidney injury on chronic kidney disease stage III T: Patient with status post kidney transplant and at this time appearing to be an uric based on charted urine output so far (only 25 cc).  Will increase the rate of intravenous fluids primarily for acid buffering but also for volume resuscitation.  If arterial blood gas continues to show persistent acidosis, CRRT will be undertaken.  We will restart cyclosporine and hold CellCept at this time with possible sepsis.  She is currently on stress dose steroids with hydrocortisone-we will transition to prednisone when tapered off this. 4.  Mixed metabolic/respiratory acidosis: Increase dose of sodium bicarbonate and repeat blood gas in 1 hour-if remains refractory, will order for CRRT. 5.  Anemia: Secondary to chronic kidney disease/chronic disease, will check iron studies.  No overt loss.  Jerritt Cardoza K. 06/29/2017, 8:18 AM

## 2017-06-29 NOTE — ED Notes (Signed)
Patient transported to CT 

## 2017-06-29 NOTE — H&P (Signed)
PULMONARY / CRITICAL CARE MEDICINE   Name: Donna Shannon MRN: [id number] DOB: 03/29/1986    ADMISSION DATE:  06/29/2017 CONSULTATION DATE:  06/29/2017  REFERRING MD:  Dr. Blinda LeatherwoodPollina   CHIEF COMPLAINT:  Hypoxia   HISTORY OF PRESENT ILLNESS: Information obtained from medical record as patient is intubated and sedated  [id number] year old female with PMH of Factor V Leiden, Kidney disease s/p renal transplant x 2, retroperitoneal fibrosis, requires urinary self catheterization, and CVA  7/23 underwent left salpingoopherectomy, admission 8/17-8/18 for ARDS. [facility]   12/21 [facility] with ongoing cough and dyspnea. Worked up for Right lower lobe PNA and sepsis, given IV Levaquin. [facility]. Upon arrival to ED oxygen saturation 57% on room air. Intubated. Administered Cefepime and Vancomycin. CT Chest with diffuse bilateral airspace opacities concerning ARDS. PCCM asked to admit.    PAST MEDICAL HISTORY :  She  has a past medical history of Anemia, Anxiety, Arthritis, Back pain, Chronic lower back pain, Constipation, Depression, Edema, Essential hypertension, Factor V Leiden ([facility]), Frequent UTI, GERD (gastroesophageal reflux disease), Gout, High cholesterol, HTN (hypertension), Kidney disease, Migraine, Neurogenic bladder, On home oxygen therapy, Osteoporosis, Ovarian cyst (dx'd 04/22/2016), Palpitations, Pneumonia (03/2016), Renal transplant failure and rejection, Renal transplant recipient (02/18/1997), Retroperitoneal fibrosis, Seizures ([facility]), Self-catheterizes urinary bladder, SOB (shortness of breath), Stroke ([facility]) (1991), and Vitamin D deficiency.  PAST SURGICAL HISTORY: She  has a past surgical history that includes Pacemaker insertion ("?early 2000s"); Kidney transplant (1996; 1998); Partial nephrectomy (1990s X 5); allograph biopsy (2013); Portacath placement (Right, 11/2015); Abdominal hernia  repair ("several; when I was little"); Hernia repair; and Right oophorectomy (Right, 2001).  Allergies  Allergen Reactions  . Phenazopyridine Other (See Comments)    Caused liver functions to go down and jaundice   . Vancomycin Itching  . Versed [Midazolam] Itching  . Sulfa Antibiotics Itching    No current facility-administered medications on file prior to encounter.    Current Outpatient Medications on File Prior to Encounter  Medication Sig  . acetaZOLAMIDE (DIAMOX) 250 MG tablet Take 250 mg by mouth 2 (two) times daily.  [name]. ALPRAZolam (XANAX) 0.5 MG tablet Take 1 tablet (0.5 mg total) by mouth 3 (three) times daily as needed for anxiety. May take 1-2 30-60 minutes before LP. Do not drive.  [name]. aspirin EC 81 MG tablet Take 81 mg by mouth daily.  . cholecalciferol (VITAMIN D) 1000 units tablet Take 1,000 Units by mouth daily.  . clonazePAM (KLONOPIN) 0.5 MG tablet Take 0.5 mg by mouth 3 (three) times daily as needed for anxiety.   . cycloSPORINE (SANDIMMUNE) 25 MG capsule Take 150 mg by mouth 2 (two) times daily.  . furosemide (LASIX) 40 MG tablet Take 40 mg by mouth daily.   [name]. gabapentin (NEURONTIN) 300 MG capsule Take 300 mg by mouth 3 (three) times daily.   [name]. lidocaine (XYLOCAINE) 2 % jelly Apply 1 application topically daily as needed (pain).   . methylphenidate (RITALIN) 10 MG tablet Take 10 mg by mouth 3 (three) times daily.   . metoprolol tartrate (LOPRESSOR) 50 MG tablet Take 50 mg by mouth 2 (two) times daily.   . mycophenolate (CELLCEPT) 500 MG tablet TAKE 1 TABLET BY MOUTH TWICE A DAY  . MYRBETRIQ 50 MG TB24 tablet Take 25 mg by mouth daily.   . ondansetron (ZOFRAN) 8 MG tablet Take 4 mg by mouth every 8 (eight) hours as needed for  nausea or vomiting.   . pravastatin (PRAVACHOL) 40 MG tablet Take 40 mg by mouth at bedtime.  . predniSONE (DELTASONE) 2.5 MG tablet Take 2.5 mg by mouth daily with breakfast.   . promethazine (PHENERGAN) 25 MG suppository UNWRAP AND INSERT 1  SUPPOSITORY RECTALLY EVERY 6 HOURS AS NEEDED FOR NAUSEA OR VOMITING  . ranitidine (ZANTAC) 75 MG tablet Take 75 mg by mouth daily.   . rizatriptan (MAXALT) 10 MG tablet Take 10 mg by mouth daily as needed for migraine. May repeat in 2 hours if needed  . sertraline (ZOLOFT) 100 MG tablet Take 100 mg by mouth daily.   . sodium bicarbonate 650 MG tablet Take 1,300 mg by mouth 2 (two) times daily.  [name] zolpidem (AMBIEN) 5 MG tablet Take 5 mg by mouth at bedtime.    FAMILY HISTORY:  Her indicated that her mother is alive. She indicated that her father is alive.   SOCIAL HISTORY: She  reports that she quit smoking about 2 years ago. Her smoking use included cigarettes. She has a 2.00 pack-year smoking history. she has never used smokeless tobacco. She reports that she drinks alcohol. She reports that she does not use drugs.  REVIEW OF SYSTEMS:   Unable to review as patient is intubated and sedated   SUBJECTIVE:    VITAL SIGNS: BP (!) 93/56   Pulse (!) 121   Temp 99 F (37.2 C) (Rectal)   Resp 16   Ht 5\' 2"  (1.575 m)   Wt 64.4 kg (142 lb)   LMP 06/08/2017   SpO2 100%   BMI 25.97 kg/m   HEMODYNAMICS:    VENTILATOR SETTINGS: Vent Mode: PRVC FiO2 (%):  [80 %-100 %] 80 % Set Rate:  [18 bmp-28 bmp] 28 bmp Vt Set:  [400 mL] 400 mL PEEP:  [8 cmH20] 8 cmH20 Plateau Pressure:  [24 cmH20] 24 cmH20  INTAKE / OUTPUT: No intake/output data recorded.  PHYSICAL EXAMINATION: General:  Young adult female, on vent  Neuro:  Sedated, moves extremities, pupils intact   HEENT:  ETT in place  Cardiovascular:  Tachy, no MRG  Lungs:  Coarse/Diminished breath sounds, no crackles/wheezes   Abdomen:  Non-distended, active bowel sounds  Musculoskeletal:  -edema  Skin:  Warm, dry   LABS:  BMET Recent Labs  Lab 06/29/17 0047  NA 140  K 4.4  CL 111  CO2 18*  BUN 41*  CREATININE 2.56*  GLUCOSE 84    Electrolytes Recent Labs  Lab 06/29/17 0047  CALCIUM 8.0*    CBC Recent Labs   Lab 06/29/17 0047  WBC 20.8*  HGB 10.1*  HCT 33.9*  PLT 279    Coag's No results for input(s): APTT, INR in the last 168 hours.  Sepsis Markers Recent Labs  Lab 06/29/17 0057 06/29/17 0247  LATICACIDVEN 0.80 0.9  PROCALCITON  --  3.23    ABG Recent Labs  Lab 06/29/17 0122 06/29/17 0330  PHART 7.098* 7.072*  PCO2ART 61.0* 71.2*  PO2ART 136.0* 331.0*    Liver Enzymes Recent Labs  Lab 06/29/17 0047  AST 54*  ALT 21  ALKPHOS 42  BILITOT 0.9  ALBUMIN 2.8*    Cardiac Enzymes Recent Labs  Lab 06/29/17 0247  TROPONINI <0.03    Glucose No results for input(s): GLUCAP in the last 168 hours.  Imaging Ct Chest Wo Contrast  Result Date: 06/29/2017 CLINICAL DATA:  Subacute onset of cough.  Sepsis. EXAM: CT CHEST WITHOUT CONTRAST TECHNIQUE: Multidetector CT imaging of the chest  was performed following the standard protocol without IV contrast. COMPARISON:  CT of the chest performed 02/22/2017 FINDINGS: Cardiovascular: The heart is normal in size. A right-sided chest port is noted ending about the right ventricle. The thoracic aorta is grossly unremarkable. The great vessels are within normal limits. Mediastinum/Nodes: Trace pericardial fluid remains within normal limits. No definite mediastinal lymphadenopathy is seen. The patient's endotracheal tube is seen ending 2 cm above the carina. An enteric tube is noted extending into the stomach. The visualized portions of the thyroid gland are unremarkable. No axillary lymphadenopathy is seen. Lungs/Pleura: Diffuse bilateral airspace opacification is noted, more dense at the lower lung lobes, and particularly hazy at the upper lung lobes, concerning for ARDS. Underlying pneumonia or flash pulmonary edema cannot be excluded. No pleural effusion or pneumothorax is seen. Upper Abdomen: The visualized portions of the liver and spleen are unremarkable. The visualized portions of the gallbladder and pancreas are within normal limits.  Musculoskeletal: No acute osseous abnormalities are identified. The visualized musculature is unremarkable in appearance. IMPRESSION: Diffuse bilateral airspace opacification, more dense at the lower lung lobes, concerning for ARDS. Underlying pneumonia or flash pulmonary edema cannot be excluded. Electronically Signed   By: Roanna Raider M.D.   On: 06/29/2017 04:11   Dg Chest Portable 1 View  Result Date: 06/29/2017 CLINICAL DATA:  31 year old female status post intubation. EXAM: PORTABLE CHEST 1 VIEW COMPARISON:  Earlier Chest radiograph dated 06/29/2017 FINDINGS: There has been interval placement of an endotracheal tube with tip approximately 2 cm above the carina. Right-sided Port-A-Cath in similar positioning as before. Enteric tube with sideport over the stomach and tip beyond the inferior margin of the image. Diffuse bilateral, left greater right, and lower lobe predominant airspace opacities again noted most likely representing ARDS. Probable small pleural effusions. No pneumothorax. Stable cardiac silhouette. No acute osseous pathology. Surgical clips noted over the upper abdomen. IMPRESSION: 1. Interval placement of an endotracheal tube with tip approximately 2 cm above the carina. Enteric tube extends into the stomach. 2. Interval worsening of the bilateral airspace opacity compared to the earlier radiograph. Electronically Signed   By: Elgie Collard M.D.   On: 06/29/2017 02:29   Dg Chest Port 1 View  Result Date: 06/29/2017 CLINICAL DATA:  Cough and bronchitis EXAM: PORTABLE CHEST 1 VIEW COMPARISON:  Chest radiograph 02/23/2017 FINDINGS: Dual-lumen right chest wall Port-A-Cath tip is in the right atrium. There are left-greater-than-right bibasilar opacities. No pleural effusion or pneumothorax. Mild cardiomegaly. Chronic peribronchial thickening. IMPRESSION: Mild cardiomegaly and peribronchial thickening consistent with chronic bronchitis. Electronically Signed   By: Deatra Robinson M.D.    On: 06/29/2017 01:11     STUDIES:  CXR 12/22 > Mild cardiomegaly and peribronchial thickening consistent with chronic bronchitis CXR 12/22 > Interval worsening of the bilateral airspace opacity compared to the earlier radiograph.  CT Chest 12/22 > Diffuse bilateral airspace opacification, more dense at the lower lung lobes, concerning for ARDS. Underlying pneumonia or flash pulmonary edema cannot be excluded  CULTURES: Blood 12/22 >> U/A 12/22 >> Sputum 12/22 >>  ANTIBIOTICS: Cefepime 12/22 >> Vancomycin 12/22 >>  SIGNIFICANT EVENTS: 12/21 > Presents to OSH   LINES/TUBES: ETT 12/22 >>   DISCUSSION: 31 year old female with recent ARDS presents hypoxic with bilateral airspace opacifications requiring intubation.   ASSESSMENT / PLAN:  PULMONARY A: Acute Hypoxic/Hypercarbic Respiratory Failure with diffuse bilateral airspace opacifications concerning for ARDS P:   Vent Support Trend ABG/CXR > currently on 10cc/kg due to acidosis, pp  within normal range  VAP bundle   CARDIOVASCULAR A:  Septic Shock  H/O HLD  P:  Cardiac Monitoring  Wean Levophed to Maintain MAP >65  Hold Pravastatin  Add Vasopressin   RENAL A:   Non-Gap Metabolic Acidosis  LA 0.9  CKD (Crt 2.2-2.5)  S/P Bilateral Renal Transplants  P:   Trend BMP  Replace electrolytes as needed  Bicarb gtt @ 125 ml/hr   GASTROINTESTINAL A:   Retroperitoneal fibrosis S/P Left salpingoophrectomy 01/2017 P:   NPO PPI  HEMATOLOGIC A:   Factor V Leiden  P:  Trend CBC INR and Fibrinogen processing  SQ Heparin   INFECTIOUS A:   Suspected Sepsis unclear etiology  PCT 3.23  Immunocompromised (on cellcept, prednisone, cyclosporine)  H/O Recurrent UTI  P:   Trend WBC and Fever curve Trend PCT and LA  PAN culture  Continue Cefepime, Vancomycin RVP and Flu pending   ENDOCRINE A:   No issues    P:   Trend Glucose  Stress-Dose Steroids (on Chronic Prednisone)   NEUROLOGIC A:   Metabolic  Encephalopathy  P:   RASS goal: 0/-1 Wean Propofol and Fentanyl gtt to achieve RASS  PRN Versed   FAMILY  - Updates: Family updated at bedside   - Inter-disciplinary family meet or Palliative Care meeting due by: 07/06/2017  CC Time: 55 minutes  Jovita KussmaulKatalina Rose Hegner, AGACNP-BC [facility]  Pgr: [phone]  PCCM Pgr: [phone]

## 2017-06-29 NOTE — Progress Notes (Signed)
Pharmacy Antibiotic Note  Donna Shannon is a 31 y.o. female admitted on 06/29/2017 with pneumonia.  Pharmacy has been consulted for Vancomycin/Cefepime dosing. [facility] and [facility] and [facility]. Hx of kidney transplant with elevated Scr, CrCl ~25-30. WBC elevated at ~20. CXR with PNA/bronchitis. Pt with resp failure requiring intubation.   Plan: Vancomycin 1000 mg IV x 1, then give 750 mg IV q24h Cefepime 1g IV q24h  Trend WBC, temp, renal function  F/U infectious work-up Drug levels as indicated  Height: 5\' 2"  (157.5 cm) Weight: 142 lb (64.4 kg) IBW/kg (Calculated) : 50.1  Temp (24hrs), Avg:99 F (37.2 C), Min:99 F (37.2 C), Max:99 F (37.2 C)  Recent Labs  Lab 06/29/17 0047 06/29/17 0057  WBC 20.8*  --   CREATININE 2.56*  --   LATICACIDVEN  --  0.80    Estimated Creatinine Clearance: 27.5 mL/min (A) (by C-G formula based on SCr of 2.56 mg/dL (H)).    Allergies  Allergen Reactions  . Sulfa Antibiotics Itching   Donna Shannon, Donna Shannon 06/29/2017 2:42 AM

## 2017-06-29 NOTE — ED Triage Notes (Signed)
Pt presents to ER from [facility] transported by her father who is chief of Therapist, [employer]; father at bedside states [facility] for 2 wks, diagnosed with bronchitis and given antibiotics; pt did not feel better and then went to [facility] ER and worked up for sepsis with R lower lobe pneumonia and WBC 20,000; pt also has history of kidney transplantation x 2 (once failed); pt was to be admitted but no accepting physician; case discussed with admitting here but never confirmed acceptance here at [facility], Onalee Hua(David, MD now off duty); father signed patient out AMA from [facility] and transperted her here in his companies unit; pt arrives with NRB in place, lethargic but arousable; room air O2 sat 57% with good pleth, HR 120 ST

## 2017-06-29 NOTE — Progress Notes (Signed)
[facility]G hand delivered to Dr. Blinda LeatherwoodPollina @ [phone]

## 2017-06-29 NOTE — Sedation Documentation (Addendum)
20 etomidate; 5ml succs at start 120/72, 132, 96, 18 @ 2a

## 2017-06-30 ENCOUNTER — Inpatient Hospital Stay (HOSPITAL_COMMUNITY): Payer: Medicare Other

## 2017-06-30 LAB — CBC
HCT: 21.6 % — ABNORMAL LOW (ref 36.0–46.0)
HEMOGLOBIN: 6.8 g/dL — AB (ref 12.0–15.0)
MCH: 28.3 pg (ref 26.0–34.0)
MCHC: 31.5 g/dL (ref 30.0–36.0)
MCV: 90 fL (ref 78.0–100.0)
Platelets: 182 10*3/uL (ref 150–400)
RBC: 2.4 MIL/uL — AB (ref 3.87–5.11)
RDW: 15.6 % — ABNORMAL HIGH (ref 11.5–15.5)
WBC: 9 10*3/uL (ref 4.0–10.5)

## 2017-06-30 LAB — URINE CULTURE: CULTURE: NO GROWTH

## 2017-06-30 LAB — BASIC METABOLIC PANEL
ANION GAP: 10 (ref 5–15)
BUN: 35 mg/dL — ABNORMAL HIGH (ref 6–20)
CALCIUM: 7.3 mg/dL — AB (ref 8.9–10.3)
CO2: 32 mmol/L (ref 22–32)
Chloride: 99 mmol/L — ABNORMAL LOW (ref 101–111)
Creatinine, Ser: 2.12 mg/dL — ABNORMAL HIGH (ref 0.44–1.00)
GFR, EST AFRICAN AMERICAN: 34 mL/min — AB (ref >60)
GFR, EST NON AFRICAN AMERICAN: 30 mL/min — AB (ref >60)
GLUCOSE: 105 mg/dL — AB (ref 65–99)
Potassium: 3 mmol/L — ABNORMAL LOW (ref 3.5–5.1)
Sodium: 141 mmol/L (ref 135–145)

## 2017-06-30 LAB — RENAL FUNCTION PANEL
Albumin: 1.8 g/dL — ABNORMAL LOW (ref 3.5–5.0)
Anion gap: 10 (ref 5–15)
BUN: 35 mg/dL — ABNORMAL HIGH (ref 6–20)
CHLORIDE: 98 mmol/L — AB (ref 101–111)
CO2: 32 mmol/L (ref 22–32)
Calcium: 7 mg/dL — ABNORMAL LOW (ref 8.9–10.3)
Creatinine, Ser: 2.28 mg/dL — ABNORMAL HIGH (ref 0.44–1.00)
GFR calc Af Amer: 31 mL/min — ABNORMAL LOW (ref >60)
GFR, EST NON AFRICAN AMERICAN: 27 mL/min — AB (ref >60)
Glucose, Bld: 145 mg/dL — ABNORMAL HIGH (ref 65–99)
POTASSIUM: 2.4 mmol/L — AB (ref 3.5–5.1)
Phosphorus: 3.3 mg/dL (ref 2.5–4.6)
Sodium: 140 mmol/L (ref 135–145)

## 2017-06-30 LAB — PREPARE RBC (CROSSMATCH)

## 2017-06-30 LAB — BLOOD GAS, ARTERIAL
Acid-Base Excess: 10.1 mmol/L — ABNORMAL HIGH (ref 0.0–2.0)
BICARBONATE: 35.7 mmol/L — AB (ref 20.0–28.0)
Drawn by: 280981
FIO2: 60
LHR: 20 {breaths}/min
MECHVT: 400 mL
O2 Saturation: 96 %
PATIENT TEMPERATURE: 98.6
PCO2 ART: 64.4 mmHg — AB (ref 32.0–48.0)
PEEP: 5 cmH2O
pH, Arterial: 7.363 (ref 7.350–7.450)
pO2, Arterial: 84.6 mmHg (ref 83.0–108.0)

## 2017-06-30 LAB — HEMOGLOBIN AND HEMATOCRIT, BLOOD
HCT: 25.6 % — ABNORMAL LOW (ref 36.0–46.0)
Hemoglobin: 8.3 g/dL — ABNORMAL LOW (ref 12.0–15.0)

## 2017-06-30 LAB — MAGNESIUM: Magnesium: 2 mg/dL (ref 1.7–2.4)

## 2017-06-30 MED ORDER — POTASSIUM CHLORIDE 20 MEQ/15ML (10%) PO SOLN
40.0000 meq | Freq: Two times a day (BID) | ORAL | Status: AC
  Administered 2017-06-30 (×2): 40 meq
  Filled 2017-06-30 (×2): qty 30

## 2017-06-30 MED ORDER — SODIUM CHLORIDE 0.9 % IV SOLN: Freq: Once | INTRAVENOUS | Status: DC

## 2017-06-30 MED ORDER — WHITE PETROLATUM EX OINT
TOPICAL_OINTMENT | CUTANEOUS | Status: AC
  Administered 2017-06-30: 1 via TOPICAL
  Filled 2017-06-30: qty 28.35

## 2017-06-30 MED ORDER — POTASSIUM CHLORIDE 20 MEQ/15ML (10%) PO SOLN: 40.0000 meq | Freq: Once | ORAL | Status: DC

## 2017-06-30 MED ORDER — WHITE PETROLATUM EX OINT
TOPICAL_OINTMENT | CUTANEOUS | Status: DC | PRN
  Administered 2017-06-30: 1 via TOPICAL
  Filled 2017-06-30: qty 28.35

## 2017-06-30 MED ORDER — DEXTROSE 5 % IV SOLN
40.0000 mg | Freq: Two times a day (BID) | INTRAVENOUS | Status: DC
  Administered 2017-06-30 – 2017-07-01 (×2): 40 mg via INTRAVENOUS
  Filled 2017-06-30 (×5): qty 0.8

## 2017-06-30 NOTE — Progress Notes (Signed)
Patient ID: Donna Shannon, female   DOB: [birth year], 31 y.o.   MRN: [id number] [facility] Progress Note   Assessment/ Plan:   1.  Acute hypoxic/hypercarbic respiratory failure: Initial imaging as well as oxygen gradient pointing to ARDS, currently on ventilator support with ongoing management by CCM. Anticipate weaning from ventilator and possible extubation later today. 2.  SIRS/sepsis: Suspected to be of pulmonary source-viral studies for influenza negative respiratory and remaining viral panel pending. She is on broad-spectrum antimicrobial coverage with cefepime and vancomycin. 3.  Acute kidney injury on chronic kidney disease stage III T:  transitioned over to intravenous cyclosporine/stress dose corticosteroids and mycophenolate currently on hold due to SIRS/sepsis. Plan to restart mycophenolate within the next 24-48 hours and transition over to oral cyclosporine once she is extubated. Optimistic with her improving urine output and awaiting labs from this morning. 4.  Mixed metabolic/respiratory acidosis:  on isotonic sodium bicarbonate drip overnight-we will assess labs from this morning to decide on either decreasing the rate of intravenous fluids or discontinuation. 5.  Anemia: Secondary to chronic kidney disease/chronic disease, without overt loss.  Subjective:   No acute events noted overnight--motioning that she is hungry and looking forward to extubation   Objective:   BP 102/82   Pulse 72   Temp 99.8 F (37.7 C) (Axillary)   Resp (!) 34   Ht '5\' 2"'$  (1.575 m)   Wt 75 kg (165 lb 5.5 oz)   LMP 06/08/2017   SpO2 99%   BMI 30.24 kg/m   Intake/Output Summary (Last 24 hours) at 06/30/2017 0755 Last data filed at 06/30/2017 8366 Gross per 24 hour  Intake 3304.23 ml  Output 2835 ml  Net 469.23 ml   Weight change: 10.6 kg (23 lb 5.5 oz)  Physical Exam: Gen: Comfortable on ventilator, father at bedside CVS: Pulse regular rhythm, normal rate, S1 and S2  normal Resp: Clear to auscultation anteriorly, no distinct rales or rhonchi Abd: Soft, obese, nontender Ext: Trace lower extremity edema  Imaging: Ct Chest Wo Contrast  Result Date: 06/29/2017 CLINICAL DATA:  Subacute onset of cough.  Sepsis. EXAM: CT CHEST WITHOUT CONTRAST TECHNIQUE: Multidetector CT imaging of the chest was performed following the standard protocol without IV contrast. COMPARISON:  CT of the chest performed 02/22/2017 FINDINGS: Cardiovascular: The heart is normal in size. A right-sided chest port is noted ending about the right ventricle. The thoracic aorta is grossly unremarkable. The great vessels are within normal limits. Mediastinum/Nodes: Trace pericardial fluid remains within normal limits. No definite mediastinal lymphadenopathy is seen. The patient's endotracheal tube is seen ending 2 cm above the carina. An enteric tube is noted extending into the stomach. The visualized portions of the thyroid gland are unremarkable. No axillary lymphadenopathy is seen. Lungs/Pleura: Diffuse bilateral airspace opacification is noted, more dense at the lower lung lobes, and particularly hazy at the upper lung lobes, concerning for ARDS. Underlying pneumonia or flash pulmonary edema cannot be excluded. No pleural effusion or pneumothorax is seen. Upper Abdomen: The visualized portions of the liver and spleen are unremarkable. The visualized portions of the gallbladder and pancreas are within normal limits. Musculoskeletal: No acute osseous abnormalities are identified. The visualized musculature is unremarkable in appearance. IMPRESSION: Diffuse bilateral airspace opacification, more dense at the lower lung lobes, concerning for ARDS. Underlying pneumonia or flash pulmonary edema cannot be excluded. Electronically Signed   By: Garald Balding M.D.   On: 06/29/2017 04:11   Dg Chest Port 1 View  Result Date: 06/30/2017  CLINICAL DATA:  Ventilator dependent EXAM: PORTABLE CHEST 1 VIEW COMPARISON:   Yesterday FINDINGS: Endotracheal tube tip between the clavicular heads and carina. An orogastric tube reaches the stomach. Porta catheter on the right with tip at the right atrium, likely near the tricuspid valve. Bilateral airspace disease as seen on CT yesterday. There has been some improvement. No visible effusion or pneumothorax. IMPRESSION: 1. Stable positioning of tubes and lines. 2. Notable porta catheter tip positioning in the deep right atrium, near the tricuspid valve. Recommend specific follow-up. 3. Bilateral airspace disease is improved from yesterday, rapid evolution favoring improving edema. Electronically Signed   By: Monte Fantasia M.D.   On: 06/30/2017 06:42   Dg Chest Portable 1 View  Result Date: 06/29/2017 CLINICAL DATA:  31 year old female status post intubation. EXAM: PORTABLE CHEST 1 VIEW COMPARISON:  Earlier Chest radiograph dated 06/29/2017 FINDINGS: There has been interval placement of an endotracheal tube with tip approximately 2 cm above the carina. Right-sided Port-A-Cath in similar positioning as before. Enteric tube with sideport over the stomach and tip beyond the inferior margin of the image. Diffuse bilateral, left greater right, and lower lobe predominant airspace opacities again noted most likely representing ARDS. Probable small pleural effusions. No pneumothorax. Stable cardiac silhouette. No acute osseous pathology. Surgical clips noted over the upper abdomen. IMPRESSION: 1. Interval placement of an endotracheal tube with tip approximately 2 cm above the carina. Enteric tube extends into the stomach. 2. Interval worsening of the bilateral airspace opacity compared to the earlier radiograph. Electronically Signed   By: Anner Crete M.D.   On: 06/29/2017 02:29   Dg Chest Port 1 View  Result Date: 06/29/2017 CLINICAL DATA:  Cough and bronchitis EXAM: PORTABLE CHEST 1 VIEW COMPARISON:  Chest radiograph 02/23/2017 FINDINGS: Dual-lumen right chest wall Port-A-Cath tip  is in the right atrium. There are left-greater-than-right bibasilar opacities. No pleural effusion or pneumothorax. Mild cardiomegaly. Chronic peribronchial thickening. IMPRESSION: Mild cardiomegaly and peribronchial thickening consistent with chronic bronchitis. Electronically Signed   By: Ulyses Jarred M.D.   On: 06/29/2017 01:11    Labs: BMET Recent Labs  Lab 06/29/17 0047 06/29/17 0606  NA 140 141  K 4.4 4.3  CL 111 112*  CO2 18* 18*  GLUCOSE 84 78  BUN 41* 43*  CREATININE 2.56* 2.56*  CALCIUM 8.0* 7.6*  PHOS  --  7.8*   CBC Recent Labs  Lab 06/29/17 0047 06/29/17 0606  WBC 20.8* 16.2*  NEUTROABS 18.3*  --   HGB 10.1* 9.0*  HCT 33.9* 30.6*  MCV 94.7 95.6  PLT 279 240    Medications:    . chlorhexidine gluconate (MEDLINE KIT)  15 mL Mouth Rinse BID  . Chlorhexidine Gluconate Cloth  6 each Topical Daily  . heparin  5,000 Units Subcutaneous Q8H  . hydrocortisone sod succinate (SOLU-CORTEF) inj  50 mg Intravenous Q6H  . mouth rinse  15 mL Mouth Rinse QID  . pantoprazole (PROTONIX) IV  40 mg Intravenous QHS   Elmarie Shiley, MD 06/30/2017, 7:55 AM

## 2017-06-30 NOTE — Progress Notes (Signed)
CRITICAL VALUE ALERT  Critical Value:  Potassium 2.4 and Hemoglobin 6.8  Date & Time Notied:  06-30-17 at [phone]  Provider Notified: Dr. Isaiah SergeMannam  Orders Received/Actions taken: No new verbal orders received

## 2017-06-30 NOTE — Progress Notes (Signed)
PULMONARY / CRITICAL CARE MEDICINE   Name: Donna EvenerStephanie Shannon MRN: [id number] DOB: 05/27/1986    ADMISSION DATE:  06/29/2017 CONSULTATION DATE:  06/29/2017  REFERRING MD:  Dr. Blinda LeatherwoodPollina   CHIEF COMPLAINT:  Hypoxia   HISTORY OF PRESENT ILLNESS: Information obtained from medical record as patient is intubated and sedated  31 year old female with PMH of Factor V Leiden, Kidney disease s/p renal transplant x 2, retroperitoneal fibrosis, requires urinary self catheterization, and CVA  7/23 underwent left salpingoopherectomy, admission 8/17-8/18 for ARDS. [facility]   12/21 [facility] with ongoing cough and dyspnea. Worked up for Right lower lobe PNA and sepsis, given IV Levaquin. Father transferred patient [facility] workup. Upon arrival to ED oxygen saturation 57% on room air. Intubated. Administered Cefepime and Vancomycin. CT Chest with diffuse bilateral airspace opacities concerning ARDS. PCCM asked to admit.    PAST MEDICAL HISTORY :  She  has a past medical history of Anemia, Anxiety, Arthritis, Back pain, Chronic lower back pain, Constipation, Depression, Edema, Essential hypertension, Factor V Leiden (HCC), Frequent UTI, GERD (gastroesophageal reflux disease), Gout, High cholesterol, HTN (hypertension), Kidney disease, Migraine, Neurogenic bladder, On home oxygen therapy, Osteoporosis, Ovarian cyst (dx'd 04/22/2016), Palpitations, Pneumonia (03/2016), Renal transplant failure and rejection, Renal transplant recipient (02/18/1997), Retroperitoneal fibrosis, Seizures (HCC), Self-catheterizes urinary bladder, SOB (shortness of breath), Stroke (HCC) (1991), and Vitamin D deficiency.  PAST SURGICAL HISTORY: She  has a past surgical history that includes Pacemaker insertion ("?early 2000s"); Kidney transplant (1996; 1998); Partial nephrectomy (1990s X 5); allograph biopsy (2013); Portacath placement (Right, 11/2015); Abdominal hernia  repair ("several; when I was little"); Hernia repair; and Right oophorectomy (Right, 2001).  Allergies  Allergen Reactions  . Phenazopyridine Other (See Comments)    Caused liver functions to go down and jaundice   . Vancomycin Itching  . Versed [Midazolam] Itching  . Sulfa Antibiotics Itching    No current facility-administered medications on file prior to encounter.    Current Outpatient Medications on File Prior to Encounter  Medication Sig  . acetaZOLAMIDE (DIAMOX) 250 MG tablet Take 250 mg by mouth 2 (two) times daily.  [name]. ALPRAZolam (XANAX) 0.5 MG tablet Take 1 tablet (0.5 mg total) by mouth 3 (three) times daily as needed for anxiety. May take 1-2 30-60 minutes before LP. Do not drive.  [name]. aspirin EC 81 MG tablet Take 81 mg by mouth daily.  . cholecalciferol (VITAMIN D) 1000 units tablet Take 1,000 Units by mouth daily.  . clonazePAM (KLONOPIN) 0.5 MG tablet Take 0.5 mg by mouth 3 (three) times daily as needed for anxiety.   . cycloSPORINE (SANDIMMUNE) 25 MG capsule Take 125 mg by mouth 2 (two) times daily.   . furosemide (LASIX) 40 MG tablet Take 40 mg by mouth daily.   [name]. gabapentin (NEURONTIN) 300 MG capsule Take 300 mg by mouth 3 (three) times daily.   [name]. lidocaine (XYLOCAINE) 2 % jelly Apply 1 application topically daily as needed (pain).   . methylphenidate (RITALIN) 10 MG tablet Take 10 mg by mouth 3 (three) times daily.   . metoprolol tartrate (LOPRESSOR) 50 MG tablet Take 50 mg by mouth 2 (two) times daily.   . mycophenolate (CELLCEPT) 500 MG tablet TAKE 1 TABLET BY MOUTH TWICE A DAY  . MYRBETRIQ 50 MG TB24 tablet Take 25 mg by mouth daily.   . ondansetron (ZOFRAN) 8 MG tablet Take 4 mg by mouth every 8 (eight) hours as needed  for nausea or vomiting.   . pravastatin (PRAVACHOL) 40 MG tablet Take 40 mg by mouth at bedtime.  . predniSONE (DELTASONE) 2.5 MG tablet Take 2.5 mg by mouth daily with breakfast.   . promethazine (PHENERGAN) 25 MG suppository UNWRAP AND INSERT 1  SUPPOSITORY RECTALLY EVERY 6 HOURS AS NEEDED FOR NAUSEA OR VOMITING  . ranitidine (ZANTAC) 75 MG tablet Take 75 mg by mouth daily.   . rizatriptan (MAXALT) 10 MG tablet Take 10 mg by mouth daily as needed for migraine. May repeat in 2 hours if needed  . sertraline (ZOLOFT) 100 MG tablet Take 100 mg by mouth daily.   . sodium bicarbonate 650 MG tablet Take 1,300 mg by mouth 2 (two) times daily.  [name] zolpidem (AMBIEN) 5 MG tablet Take 5 mg by mouth at bedtime.    FAMILY HISTORY:  Her indicated that her mother is alive. She indicated that her father is alive.   SOCIAL HISTORY: She  reports that she quit smoking about 2 years ago. Her smoking use included cigarettes. She has a 2.00 pack-year smoking history. she has never used smokeless tobacco. She reports that she drinks alcohol. She reports that she does not use drugs.  REVIEW OF SYSTEMS:   Unable to review as patient is intubated and sedated   SUBJECTIVE:  Stable overnight.  Continues on the vent. Off levaphed  VITAL SIGNS: BP 121/77 (BP Location: Left Arm)   Pulse 87   Temp 99.8 F (37.7 C) (Axillary)   Resp 20   Ht 5\' 2"  (1.575 m)   Wt 165 lb 5.5 oz (75 kg)   LMP 06/08/2017   SpO2 96%   BMI 30.24 kg/m   HEMODYNAMICS:    VENTILATOR SETTINGS: Vent Mode: PRVC FiO2 (%):  [40 %-60 %] 60 % Set Rate:  [20 bmp-34 bmp] 20 bmp Vt Set:  [400 mL-500 mL] 400 mL PEEP:  [5 cmH20] 5 cmH20 Plateau Pressure:  [20 cmH20-29 cmH20] 20 cmH20  INTAKE / OUTPUT: I/O last 3 completed shifts: In: 4390.2 [I.V.:2888.6; IV Piggyback:1501.6] Out: 2885 [Urine:2585; Emesis/NG output:300]  PHYSICAL EXAMINATION: Blood pressure 121/77, pulse 87, temperature 99.8 F (37.7 C), temperature source Axillary, resp. rate 20, height 5\' 2"  (1.575 m), weight 165 lb 5.5 oz (75 kg), last menstrual period 06/08/2017, SpO2 96 %. Gen:      No acute distress HEENT:  EOMI, sclera anicteric, ETT tube Neck:     No masses; no thyromegaly Lungs:    Clear to  auscultation bilaterally; normal respiratory effort CV:         Regular rate and rhythm; no murmurs Abd:      + bowel sounds; soft, non-tender; no palpable masses, no distension Ext:    No edema; adequate peripheral perfusion Skin:      Warm and dry; no rash Neuro: alert and oriented x 3 Psych: normal mood and affect  LABS:  BMET Recent Labs  Lab 06/29/17 0047 06/29/17 0606  NA 140 141  K 4.4 4.3  CL 111 112*  CO2 18* 18*  BUN 41* 43*  CREATININE 2.56* 2.56*  GLUCOSE 84 78    Electrolytes Recent Labs  Lab 06/29/17 0047 06/29/17 0606  CALCIUM 8.0* 7.6*  MG  --  1.5*  PHOS  --  7.8*    CBC Recent Labs  Lab 06/29/17 0047 06/29/17 0606  WBC 20.8* 16.2*  HGB 10.1* 9.0*  HCT 33.9* 30.6*  PLT 279 240    Coag's Recent Labs  Lab 06/29/17 0606  INR 1.33    Sepsis Markers Recent Labs  Lab 06/29/17 0057 06/29/17 0247 06/29/17 0609  LATICACIDVEN 0.80 0.9 1.0  PROCALCITON  --  3.23  --     ABG Recent Labs  Lab 06/29/17 0330 06/29/17 0549 06/29/17 0920  PHART 7.072* 7.128* 7.244*  PCO2ART 71.2* 56.6* 47.5  PO2ART 331.0* 66.2* 77.1*    Liver Enzymes Recent Labs  Lab 06/29/17 0047  AST 54*  ALT 21  ALKPHOS 42  BILITOT 0.9  ALBUMIN 2.8*    Cardiac Enzymes Recent Labs  Lab 06/29/17 0247 06/29/17 0607 06/29/17 1651  TROPONINI <0.03 <0.03 <0.03    Glucose Recent Labs  Lab 06/29/17 0645 06/29/17 0745  GLUCAP 73 84    Imaging Dg Chest Port 1 View  Result Date: 06/30/2017 CLINICAL DATA:  Ventilator dependent EXAM: PORTABLE CHEST 1 VIEW COMPARISON:  Yesterday FINDINGS: Endotracheal tube tip between the clavicular heads and carina. An orogastric tube reaches the stomach. Porta catheter on the right with tip at the right atrium, likely near the tricuspid valve. Bilateral airspace disease as seen on CT yesterday. There has been some improvement. No visible effusion or pneumothorax. IMPRESSION: 1. Stable positioning of tubes and lines. 2.  Notable porta catheter tip positioning in the deep right atrium, near the tricuspid valve. Recommend specific follow-up. 3. Bilateral airspace disease is improved from yesterday, rapid evolution favoring improving edema. Electronically Signed   By: Marnee SpringJonathon  Watts M.D.   On: 06/30/2017 06:42     STUDIES:  CXR 12/22 > Mild cardiomegaly and peribronchial thickening consistent with chronic bronchitis CXR 12/22 > Interval worsening of the bilateral airspace opacity compared to the earlier radiograph.  CT Chest 12/22 > Diffuse bilateral airspace opacification, more dense at the lower lung lobes, concerning for ARDS. Underlying pneumonia or flash pulmonary edema cannot be excluded  CULTURES: Blood 12/22 >> U/A 12/22 >> Sputum 12/22 >> RVP 12/22 > mycoplasma pneumonia  ANTIBIOTICS: Cefepime 12/22 >> Vancomycin 12/22 >> Azithromycin 12/22>>  SIGNIFICANT EVENTS: 12/21 > Presents to OSH   LINES/TUBES: ETT 12/22 >>   DISCUSSION: 31 year old female with recent ARDS presents hypoxic with bilateral airspace opacifications, Mycoplasma PNA requiring intubation.   ASSESSMENT / PLAN:  PULMONARY A: Acute Hypoxic/Hypercarbic Respiratory Failure with diffuse bilateral airspace opacifications concerning for ARDS P:   Continue vent support Reduce RR to 20 and TV to 8cc/kg Repeat ABG.  Start SBTs as tolerated  CARDIOVASCULAR A:  Septic Shock  H/O HLD  P:  Cardiac Monitoring  Off pressors Hold Pravastatin   RENAL A:   Non-Gap Metabolic Acidosis  LA 0.9  CKD (Crt 2.2-2.5)  S/P Bilateral Renal Transplants  P:   Trend BMP  Replace electrolytes as needed  Continue bicarb drip  GASTROINTESTINAL A:   Retroperitoneal fibrosis S/P Left salpingoophrectomy 01/2017 P:   Keep NPO.  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, prednisone, cyclosporine)  H/O  Recurrent UTI  P:   Trend WBC and Fever curve Trend PCT and LA  On vanco, cefepine and azithro  ENDOCRINE A:   No issues    P:   Trend Glucose  Stress-Dose Steroids (on Chronic Prednisone)   NEUROLOGIC A:   Metabolic Encephalopathy  P:   RASS goal: 0/-1 Wean Propofol and Fentanyl gtt to achieve RASS  PRN Versed   FAMILY  - Updates: Family updated  at bedside  - Inter-disciplinary family meet or Palliative Care meeting due by: 07/06/2017  The patient is critically ill with multiple organ system failure and requires high complexity decision making for assessment and support, frequent evaluation and titration of therapies, advanced monitoring, review of radiographic studies and interpretation of complex data.   Critical Care Time devoted to patient care services, exclusive of separately billable procedures, described in this note is 35 minutes.   Chilton GreathousePraveen Virgilio Broadhead MD [facility] Pager [phone] If no answer or after 3pm call: [phone] 06/30/2017, 8:24 AM

## 2017-06-30 NOTE — Procedures (Signed)
Vent settings changed at this time per MD order.  VT decreased to 8ml - 400, RR decreased to 20.  Spo2 decreased post changes to 86%, increased fio2 to 60%.  MD aware. Will follow up with ABG.  RT will monitor

## 2017-06-30 NOTE — Plan of Care (Signed)
Patient has had pleural pain throughout night.  Vitals have remained stable.

## 2017-07-01 ENCOUNTER — Inpatient Hospital Stay (HOSPITAL_COMMUNITY): Payer: Medicare Other

## 2017-07-01 LAB — RENAL FUNCTION PANEL
ALBUMIN: 2 g/dL — AB (ref 3.5–5.0)
Anion gap: 12 (ref 5–15)
BUN: 36 mg/dL — ABNORMAL HIGH (ref 6–20)
CO2: 30 mmol/L (ref 22–32)
CREATININE: 2.2 mg/dL — AB (ref 0.44–1.00)
Calcium: 8 mg/dL — ABNORMAL LOW (ref 8.9–10.3)
Chloride: 99 mmol/L — ABNORMAL LOW (ref 101–111)
GFR calc Af Amer: 33 mL/min — ABNORMAL LOW (ref >60)
GFR, EST NON AFRICAN AMERICAN: 28 mL/min — AB (ref >60)
Glucose, Bld: 95 mg/dL (ref 65–99)
PHOSPHORUS: 6.2 mg/dL — AB (ref 2.5–4.6)
POTASSIUM: 3.6 mmol/L (ref 3.5–5.1)
Sodium: 141 mmol/L (ref 135–145)

## 2017-07-01 LAB — POCT I-STAT 3, ART BLOOD GAS (G3+)
ACID-BASE EXCESS: 8 mmol/L — AB (ref 0.0–2.0)
Bicarbonate: 35 mmol/L — ABNORMAL HIGH (ref 20.0–28.0)
O2 SAT: 98 %
PH ART: 7.319 — AB (ref 7.350–7.450)
PO2 ART: 127 mmHg — AB (ref 83.0–108.0)
Patient temperature: 98.2
TCO2: 37 mmol/L — ABNORMAL HIGH (ref 22–32)
pCO2 arterial: 68 mmHg (ref 32.0–48.0)

## 2017-07-01 LAB — CBC
HCT: 26.9 % — ABNORMAL LOW (ref 36.0–46.0)
Hemoglobin: 8.4 g/dL — ABNORMAL LOW (ref 12.0–15.0)
MCH: 29 pg (ref 26.0–34.0)
MCHC: 31.2 g/dL (ref 30.0–36.0)
MCV: 92.8 fL (ref 78.0–100.0)
PLATELETS: 201 10*3/uL (ref 150–400)
RBC: 2.9 MIL/uL — ABNORMAL LOW (ref 3.87–5.11)
RDW: 15.9 % — ABNORMAL HIGH (ref 11.5–15.5)
WBC: 9.8 10*3/uL (ref 4.0–10.5)

## 2017-07-01 LAB — LEGIONELLA PNEUMOPHILA SEROGP 1 UR AG: L. pneumophila Serogp 1 Ur Ag: NEGATIVE

## 2017-07-01 LAB — BPAM RBC
BLOOD PRODUCT EXPIRATION DATE: 201901182359
ISSUE DATE / TIME: 201812231241
Unit Type and Rh: 5100

## 2017-07-01 LAB — CULTURE, RESPIRATORY

## 2017-07-01 LAB — TYPE AND SCREEN
ABO/RH(D): O POS
ANTIBODY SCREEN: NEGATIVE
UNIT DIVISION: 0

## 2017-07-01 LAB — PROCALCITONIN: Procalcitonin: 3.85 ng/mL

## 2017-07-01 LAB — GLUCOSE, CAPILLARY: GLUCOSE-CAPILLARY: 81 mg/dL (ref 65–99)

## 2017-07-01 LAB — CULTURE, RESPIRATORY W GRAM STAIN

## 2017-07-01 LAB — MAGNESIUM: MAGNESIUM: 2.1 mg/dL (ref 1.7–2.4)

## 2017-07-01 MED ORDER — ACETAZOLAMIDE SODIUM 500 MG IJ SOLR
250.0000 mg | Freq: Once | INTRAMUSCULAR | Status: AC
  Administered 2017-07-01: 250 mg via INTRAVENOUS
  Filled 2017-07-01: qty 250

## 2017-07-01 MED ORDER — PANTOPRAZOLE SODIUM 40 MG PO TBEC
40.0000 mg | DELAYED_RELEASE_TABLET | Freq: Every day | ORAL | Status: DC
  Administered 2017-07-01 – 2017-07-04 (×4): 40 mg via ORAL
  Filled 2017-07-01 (×4): qty 1

## 2017-07-01 MED ORDER — CLONAZEPAM 0.5 MG PO TABS
0.5000 mg | ORAL_TABLET | Freq: Every day | ORAL | Status: DC
  Administered 2017-07-01: 0.5 mg via ORAL
  Filled 2017-07-01: qty 1

## 2017-07-01 MED ORDER — ZOLPIDEM TARTRATE 5 MG PO TABS
5.0000 mg | ORAL_TABLET | Freq: Every evening | ORAL | Status: DC | PRN
  Administered 2017-07-01: 5 mg via ORAL
  Filled 2017-07-01: qty 1

## 2017-07-01 MED ORDER — CYCLOSPORINE MODIFIED (NEORAL) 25 MG PO CAPS
125.0000 mg | ORAL_CAPSULE | Freq: Two times a day (BID) | ORAL | Status: DC
  Administered 2017-07-02 – 2017-07-05 (×7): 125 mg via ORAL
  Filled 2017-07-01 (×4): qty 5
  Filled 2017-07-01: qty 1
  Filled 2017-07-01 (×4): qty 5

## 2017-07-01 MED ORDER — MYCOPHENOLATE MOFETIL 250 MG PO CAPS
500.0000 mg | ORAL_CAPSULE | Freq: Two times a day (BID) | ORAL | Status: DC
  Administered 2017-07-02 – 2017-07-05 (×7): 500 mg via ORAL
  Filled 2017-07-01 (×7): qty 2

## 2017-07-01 MED ORDER — VITAL HIGH PROTEIN PO LIQD: 1000.0000 mL | ORAL | Status: DC

## 2017-07-01 MED ORDER — VITAL AF 1.2 CAL PO LIQD: 1000.0000 mL | ORAL | Status: DC

## 2017-07-01 MED ORDER — MYCOPHENOLATE 200 MG/ML ORAL SUSPENSION
500.0000 mg | Freq: Two times a day (BID) | ORAL | Status: AC
  Administered 2017-07-01 (×2): 500 mg
  Filled 2017-07-01 (×2): qty 10

## 2017-07-01 MED ORDER — ALTEPLASE 2 MG IJ SOLR
2.0000 mg | Freq: Once | INTRAMUSCULAR | Status: AC
  Administered 2017-07-01: 2 mg
  Filled 2017-07-01: qty 2

## 2017-07-01 MED ORDER — MYCOPHENOLATE MOFETIL 250 MG PO CAPS
500.0000 mg | ORAL_CAPSULE | Freq: Two times a day (BID) | ORAL | Status: DC
  Filled 2017-07-01: qty 2

## 2017-07-01 MED ORDER — PANTOPRAZOLE SODIUM 40 MG PO PACK
40.0000 mg | PACK | Freq: Every day | ORAL | Status: DC
  Filled 2017-07-01: qty 20

## 2017-07-01 MED ORDER — CYCLOSPORINE MODIFIED (NEORAL) 100 MG/ML PO SOLN
125.0000 mg | Freq: Two times a day (BID) | ORAL | Status: AC
  Administered 2017-07-01: 125 mg
  Filled 2017-07-01: qty 1.3

## 2017-07-01 MED ORDER — HYDROCORTISONE NA SUCCINATE PF 100 MG IJ SOLR
50.0000 mg | Freq: Two times a day (BID) | INTRAMUSCULAR | Status: DC
  Administered 2017-07-01 – 2017-07-02 (×2): 50 mg via INTRAVENOUS
  Filled 2017-07-01 (×2): qty 1

## 2017-07-01 NOTE — Procedures (Signed)
Extubation Procedure Note  Patient Details:   Name: Donna EvenerStephanie Dedominicis DOB: 05/09/1986 MRN: [id number]   Airway Documentation:  Airway 23 mm (Active)    Evaluation  O2 sats: stable throughout Complications: No apparent complications Patient did tolerate procedure well. Bilateral Breath Sounds: Diminished, Clear   Yes   Patient extubated to 5 LNC at this time. Sitting up in chair and doing well. Able to vocalize and clear secretions.  [name], [name] 07/01/2017, 4:27 PM

## 2017-07-01 NOTE — Progress Notes (Signed)
eLink Physician-Brief Progress Note Patient Name: Donna EvenerStephanie Delacruz DOB: 02/18/1986 MRN: [id number]   Date of Service  07/01/2017  HPI/Events of Note  Hypercarbic to 68 on am abg though ph 7.32 c/w well compensated resp acidosis  eICU Interventions  Increase vent only by rate of 2 as acidosis is not felt to be a disadvantage in this setting     Intervention Category Major Interventions: Respiratory failure - evaluation and management  Sandrea HughsMichael Gissele Narducci 07/01/2017, 3:47 AM

## 2017-07-01 NOTE — Progress Notes (Addendum)
PULMONARY / CRITICAL CARE MEDICINE   Name: Donna EvenerStephanie Shannon MRN: [id number] DOB: 05/27/1986    ADMISSION DATE:  06/29/2017 CONSULTATION DATE:  06/29/2017  REFERRING MD:  Dr. Blinda LeatherwoodPollina   CHIEF COMPLAINT:  Hypoxia   HISTORY OF PRESENT ILLNESS: Information obtained from medical record as patient is intubated and sedated  31 year old female with PMH of Factor V Leiden, Kidney disease s/p renal transplant x 2, retroperitoneal fibrosis, requires urinary self catheterization, and CVA  7/23 underwent left salpingoopherectomy, admission 8/17-8/18 for ARDS. [facility]   12/21 [facility] with ongoing cough and dyspnea. Worked up for Right lower lobe PNA and sepsis, given IV Levaquin. Father transferred [facility] workup. Upon arrival to ED oxygen saturation 57% on room air. Intubated. Administered Cefepime and Vancomycin. CT Chest with diffuse bilateral airspace opacities concerning ARDS. PCCM asked to admit.    PAST MEDICAL HISTORY :  She  has a past medical history of Anemia, Anxiety, Arthritis, Back pain, Chronic lower back pain, Constipation, Depression, Edema, Essential hypertension, Factor V Leiden (HCC), Frequent UTI, GERD (gastroesophageal reflux disease), Gout, High cholesterol, HTN (hypertension), Kidney disease, Migraine, Neurogenic bladder, On home oxygen therapy, Osteoporosis, Ovarian cyst (dx'd 04/22/2016), Palpitations, Pneumonia (03/2016), Renal transplant failure and rejection, Renal transplant recipient (02/18/1997), Retroperitoneal fibrosis, Seizures (HCC), Self-catheterizes urinary bladder, SOB (shortness of breath), Stroke (HCC) (1991), and Vitamin D deficiency.  PAST SURGICAL HISTORY: She  has a past surgical history that includes Pacemaker insertion ("?early 2000s"); Kidney transplant (1996; 1998); Partial nephrectomy (1990s X 5); allograph biopsy (2013); Portacath placement (Right, 11/2015); Abdominal hernia  repair ("several; when I was little"); Hernia repair; and Right oophorectomy (Right, 2001).  Allergies  Allergen Reactions  . Phenazopyridine Other (See Comments)    Caused liver functions to go down and jaundice   . Vancomycin Itching  . Versed [Midazolam] Itching  . Sulfa Antibiotics Itching    No current facility-administered medications on file prior to encounter.    Current Outpatient Medications on File Prior to Encounter  Medication Sig  . acetaZOLAMIDE (DIAMOX) 250 MG tablet Take 250 mg by mouth 2 (two) times daily.  [name]. ALPRAZolam (XANAX) 0.5 MG tablet Take 1 tablet (0.5 mg total) by mouth 3 (three) times daily as needed for anxiety. May take 1-2 30-60 minutes before LP. Do not drive.  [name]. aspirin EC 81 MG tablet Take 81 mg by mouth daily.  . cholecalciferol (VITAMIN D) 1000 units tablet Take 1,000 Units by mouth daily.  . clonazePAM (KLONOPIN) 0.5 MG tablet Take 0.5 mg by mouth 3 (three) times daily as needed for anxiety.   . cycloSPORINE (SANDIMMUNE) 25 MG capsule Take 125 mg by mouth 2 (two) times daily.   . furosemide (LASIX) 40 MG tablet Take 40 mg by mouth daily.   [name]. gabapentin (NEURONTIN) 300 MG capsule Take 300 mg by mouth 3 (three) times daily.   [name]. lidocaine (XYLOCAINE) 2 % jelly Apply 1 application topically daily as needed (pain).   . methylphenidate (RITALIN) 10 MG tablet Take 10 mg by mouth 3 (three) times daily.   . metoprolol tartrate (LOPRESSOR) 50 MG tablet Take 50 mg by mouth 2 (two) times daily.   . mycophenolate (CELLCEPT) 500 MG tablet TAKE 1 TABLET BY MOUTH TWICE A DAY  . MYRBETRIQ 50 MG TB24 tablet Take 25 mg by mouth daily.   . ondansetron (ZOFRAN) 8 MG tablet Take 4 mg by mouth every 8 (eight) hours as needed  for nausea or vomiting.   . pravastatin (PRAVACHOL) 40 MG tablet Take 40 mg by mouth at bedtime.  . predniSONE (DELTASONE) 2.5 MG tablet Take 2.5 mg by mouth daily with breakfast.   . promethazine (PHENERGAN) 25 MG suppository UNWRAP AND INSERT 1  SUPPOSITORY RECTALLY EVERY 6 HOURS AS NEEDED FOR NAUSEA OR VOMITING  . ranitidine (ZANTAC) 75 MG tablet Take 75 mg by mouth daily.   . rizatriptan (MAXALT) 10 MG tablet Take 10 mg by mouth daily as needed for migraine. May repeat in 2 hours if needed  . sertraline (ZOLOFT) 100 MG tablet Take 100 mg by mouth daily.   . sodium bicarbonate 650 MG tablet Take 1,300 mg by mouth 2 (two) times daily.  [name] zolpidem (AMBIEN) 5 MG tablet Take 5 mg by mouth at bedtime.    FAMILY HISTORY:  Her indicated that her mother is alive. She indicated that her father is alive.   SOCIAL HISTORY: She  reports that she quit smoking about 2 years ago. Her smoking use included cigarettes. She has a 2.00 pack-year smoking history. she has never used smokeless tobacco. She reports that she drinks alcohol. She reports that she does not use drugs.  REVIEW OF SYSTEMS:   Unable to review as patient is intubated and sedated   SUBJECTIVE:  No events overnight  VITAL SIGNS: BP (!) 134/94   Pulse 93   Temp 98.4 F (36.9 C) (Oral)   Resp (!) 22   Ht 5\' 2"  (1.575 m)   Wt 168 lb 14 oz (76.6 kg)   LMP 06/08/2017   SpO2 97%   BMI 30.89 kg/m   HEMODYNAMICS:    VENTILATOR SETTINGS: Vent Mode: PRVC FiO2 (%):  [50 %] 50 % Set Rate:  [20 bmp-200 bmp] 22 bmp Vt Set:  [400 mL] 400 mL PEEP:  [5 cmH20] 5 cmH20 Plateau Pressure:  [13 cmH20-24 cmH20] 24 cmH20  INTAKE / OUTPUT: I/O last 3 completed shifts: In: 4698 [I.V.:3396.4; IV Piggyback:1301.6] Out: 3910 [Urine:3810; Emesis/NG output:100]  PHYSICAL EXAMINATION: Gen:      No acute distress HEENT:  EOMI, sclera anicteric, ETT in place Neck:     No masses; no thyromegaly Lungs:    Clear to auscultation bilaterally; normal respiratory effort CV:         Regular rate and rhythm; no murmurs Abd:      + bowel sounds; soft, non-tender; no palpable masses, no distension Ext:    No edema; adequate peripheral perfusion Skin:      Warm and dry; no rash Neuro: Awake,  responsive  LABS:  BMET Recent Labs  Lab 06/30/17 0758 06/30/17 1847 07/01/17 0316  NA 140 141 141  K 2.4* 3.0* 3.6  CL 98* 99* 99*  CO2 32 32 30  BUN 35* 35* 36*  CREATININE 2.28* 2.12* 2.20*  GLUCOSE 145* 105* 95    Electrolytes Recent Labs  Lab 06/29/17 0606 06/30/17 0720 06/30/17 0758 06/30/17 1847 07/01/17 0316  CALCIUM 7.6*  --  7.0* 7.3* 8.0*  MG 1.5* 2.0  --   --  2.1  PHOS 7.8*  --  3.3  --  6.2*    CBC Recent Labs  Lab 06/29/17 0606 06/30/17 0720 06/30/17 1848 07/01/17 0316  WBC 16.2* 9.0  --  9.8  HGB 9.0* 6.8* 8.3* 8.4*  HCT 30.6* 21.6* 25.6* 26.9*  PLT 240 182  --  201    Coag's Recent Labs  Lab 06/29/17 0606  INR 1.33  Sepsis Markers Recent Labs  Lab 06/29/17 0057 06/29/17 0247 06/29/17 0609 07/01/17 0316  LATICACIDVEN 0.80 0.9 1.0  --   PROCALCITON  --  3.23  --  3.85    ABG Recent Labs  Lab 06/29/17 0920 06/30/17 0940 07/01/17 0339  PHART 7.244* 7.363 7.319*  PCO2ART 47.5 64.4* 68.0*  PO2ART 77.1* 84.6 127.0*    Liver Enzymes Recent Labs  Lab 06/29/17 0047 06/30/17 0758 07/01/17 0316  AST 54*  --   --   ALT 21  --   --   ALKPHOS 42  --   --   BILITOT 0.9  --   --   ALBUMIN 2.8* 1.8* 2.0*    Cardiac Enzymes Recent Labs  Lab 06/29/17 0247 06/29/17 0607 06/29/17 1651  TROPONINI <0.03 <0.03 <0.03    Glucose Recent Labs  Lab 06/29/17 0645 06/29/17 0745  GLUCAP 73 84    Imaging Dg Chest Port 1 View  Result Date: 07/01/2017 CLINICAL DATA:  Followup ventilator support EXAM: PORTABLE CHEST 1 VIEW COMPARISON:  06/30/2017 FINDINGS: Endotracheal tube tip is 2.5 cm above the carina. Nasogastric tube enters the stomach. Power port tip remains in the right atrium. Bilateral pulmonary infiltrates persist without dense consolidation. No new finding. IMPRESSION: Persistent bilateral pneumonia.  No new finding. Electronically Signed   By: Paulina Fusi M.D.   On: 07/01/2017 06:58    STUDIES:  CXR 12/22 >  Mild cardiomegaly and peribronchial thickening consistent with chronic bronchitis CXR 12/22 > Interval worsening of the bilateral airspace opacity compared to the earlier radiograph.  CT Chest 12/22 > Diffuse bilateral airspace opacification, more dense at the lower lung lobes, concerning for ARDS. Underlying pneumonia or flash pulmonary edema cannot be excluded  CULTURES: Blood 12/22 >> U/A 12/22 >> Sputum 12/22 >> RVP 12/22 > mycoplasma pneumonia  ANTIBIOTICS: Cefepime 12/22 >> Vancomycin 12/22 >> 12/24 Azithromycin 12/22>>  SIGNIFICANT EVENTS: 12/21 > Presents to OSH   LINES/TUBES: ETT 12/22 >>   DISCUSSION: 31 year old female with recent ARDS presents hypoxic with bilateral airspace opacifications, Mycoplasma PNA requiring intubation.   ASSESSMENT / PLAN:  PULMONARY A: Acute Hypoxic/Hypercarbic Respiratory Failure with diffuse bilateral airspace opacifications concerning for ARDS ABG shows compensated hypercarbia P:   Continue vent support SBTs as tolerated Increase RR to reduce Co2.   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 Can DC vanco Check urine legionella, pneumococcus  ENDOCRINE A:   No issues    P:   Trend Glucose  Stress-Dose Steroids (on Chronic Prednisone). Reduce hydrocortisone dose to 50 mg q12  NEUROLOGIC A:   Metabolic Encephalopathy  P:   RASS  goal: 0/-1 Wean Propofol and Fentanyl gtt PRN Versed   FAMILY  - Updates: Family updated at bedside  - Inter-disciplinary family meet or Palliative Care meeting due by: 07/06/2017  The patient is critically ill  with multiple organ system failure and requires high complexity decision making for assessment and support, frequent evaluation and titration of therapies, advanced monitoring, review of radiographic studies and interpretation of complex data.   Critical Care Time devoted to patient care services, exclusive of separately billable procedures, described in this note is 35 minutes.   Chilton GreathousePraveen Calden Dorsey MD [facility] Pager [phone] If no answer or after 3pm call: [phone] 07/01/2017, 10:37 AM

## 2017-07-01 NOTE — Progress Notes (Signed)
Nutrition Follow-up / Consult  DOCUMENTATION CODES:   Not applicable  INTERVENTION:   Start TF:  Vital AF 1.2 at 55 ml/h (1320 ml per day)  Provides 1584 kcal, 99 gm protein, 1071 ml free water daily  NUTRITION DIAGNOSIS:   Inadequate oral intake related to inability to eat as evidenced by NPO status.  Ongoing  GOAL:   Patient will meet greater than or equal to 90% of their needs   Being addressed with TF  MONITOR:   Vent status, Labs, I & O's, TF tolerance  REASON FOR ASSESSMENT:   Consult(verbal) Enteral/tube feeding initiation and management  ASSESSMENT:   31 yo female with PMH of renal transplant, neurogenic bladder, HTN, frequent UTI, factor V Leiden deficiency, HLD, PNA, 2 L oxygen at night, stroke, migraines, anxiety, depression, osteoporosis, GERD, vitamin D deficiency, seizures who was admitted on 12/22 with hypoxia/ARDS, requiring intubation in the ED.   Received verbal MD Consult for TF initiation and management. Patient remains intubated on ventilator support MV: 9.5 L/min Temp (24hrs), Avg:98.2 F (36.8 C), Min:97.6 F (36.4 C), Max:98.7 F (37.1 C)   Labs reviewed. Phosphorus 6.2 (H) Medications reviewed. Nephrology following.  Diet Order:  Diet NPO time specified  EDUCATION NEEDS:   No education needs have been identified at this time  Skin:  Skin Assessment: Reviewed RN Assessment  Last BM:  12/23  Height:   Ht Readings from Last 1 Encounters:  06/30/17 5\' 2"  (1.575 m)    Weight:   Wt Readings from Last 1 Encounters:  07/01/17 168 lb 14 oz (76.6 kg)   06/29/17 157 lb 6.5 oz (71.4 kg) BMI=28.8    Ideal Body Weight:  50 kg  BMI:  Body mass index is 30.89 kg/m.  Estimated Nutritional Needs:   Kcal:  1600  Protein:  95-110 gm  Fluid:  1.6-1.8 L   Donna Shannon, RD, LDN, CNSC Pager [phone] After Hours Pager [phone]

## 2017-07-01 NOTE — Progress Notes (Signed)
75ml of fentanyl wasted with Cecil Cobbsachel Proctor, RN.

## 2017-07-01 NOTE — Plan of Care (Signed)
Patient has required several doses of fent for pain management.  Vitals have remained stable.  She also requires et suction on a regular bases due to heavy secretions.  At one point was very panicked but recovered after treatment.

## 2017-07-01 NOTE — Progress Notes (Signed)
Subjective: Interval History: has complaints wants tube out.  Objective: Vital signs in last 24 hours: Temp:  [97.6 F (36.4 C)-99.8 F (37.7 C)] 98.2 F (36.8 C) (12/24 0328) Pulse Rate:  [80-107] 86 (12/24 0500) Resp:  [20-22] 20 (12/23 1600) BP: (99-131)/(67-97) 131/95 (12/24 0500) SpO2:  [92 %-99 %] 97 % (12/24 0500) FiO2 (%):  [50 %-60 %] 50 % (12/24 0327) Weight:  [76.6 kg (168 lb 14 oz)] 76.6 kg (168 lb 14 oz) (12/24 0630) Weight change: 1.6 kg (3 lb 8.4 oz)  Intake/Output from previous day: 12/23 0701 - 12/24 0700 In: 4522.8 [I.V.:3221.2; IV Piggyback:1301.6] Out: 1755 [Urine:1655; Emesis/NG output:100] Intake/Output this shift: No intake/output data recorded.  General appearance: cooperative, mildly obese and pale Resp: rales bilaterally and rhonchi bilaterally Cardio: regular rate and rhythm, S1, S2 normal and systolic murmur: systolic ejection 2/6, decrescendo at 2nd left intercostal space GI: soft, pos bs, TX Extremities: edema 2+  Lab Results: Recent Labs    06/30/17 0720 06/30/17 1848 07/01/17 0316  WBC 9.0  --  9.8  HGB 6.8* 8.3* 8.4*  HCT 21.6* 25.6* 26.9*  PLT 182  --  201   BMET:  Recent Labs    06/30/17 1847 07/01/17 0316  NA 141 141  K 3.0* 3.6  CL 99* 99*  CO2 32 30  GLUCOSE 105* 95  BUN 35* 36*  CREATININE 2.12* 2.20*  CALCIUM 7.3* 8.0*   No results for input(s): PTH in the last 72 hours. Iron Studies: No results for input(s): IRON, TIBC, TRANSFERRIN, FERRITIN in the last 72 hours.  Studies/Results: Dg Chest Port 1 View  Result Date: 07/01/2017 CLINICAL DATA:  Followup ventilator support EXAM: PORTABLE CHEST 1 VIEW COMPARISON:  06/30/2017 FINDINGS: Endotracheal tube tip is 2.5 cm above the carina. Nasogastric tube enters the stomach. Power port tip remains in the right atrium. Bilateral pulmonary infiltrates persist without dense consolidation. No new finding. IMPRESSION: Persistent bilateral pneumonia.  No new finding.  Electronically Signed   By: Paulina FusiMark  Shogry M.D.   On: 07/01/2017 06:58   Dg Chest Port 1 View  Result Date: 06/30/2017 CLINICAL DATA:  Ventilator dependent EXAM: PORTABLE CHEST 1 VIEW COMPARISON:  Yesterday FINDINGS: Endotracheal tube tip between the clavicular heads and carina. An orogastric tube reaches the stomach. Porta catheter on the right with tip at the right atrium, likely near the tricuspid valve. Bilateral airspace disease as seen on CT yesterday. There has been some improvement. No visible effusion or pneumothorax. IMPRESSION: 1. Stable positioning of tubes and lines. 2. Notable porta catheter tip positioning in the deep right atrium, near the tricuspid valve. Recommend specific follow-up. 3. Bilateral airspace disease is improved from yesterday, rapid evolution favoring improving edema. Electronically Signed   By: Marnee SpringJonathon  Watts M.D.   On: 06/30/2017 06:42    I have reviewed the patient's current medications.  Assessment/Plan: 1 Renal transplant with AKI nonoliguric.  Cr no change.  Metab acidosis ok.  K ok.  Need to get back on Mycophenolate with improvement pneu/?ARDS.  Prob mild ATN 2 Pneu/ARDs on 3 AB.  Still much secretions. Signif CO2 retention,  ?? Why , maybe underlying with fx OSA 3 Factor V Leiden 4 Neurogenic bladder cath 5 RP fibrosis 6  Obesity 7 HTN not an issue 8 Migraines 9 Anemia ?? Acute illness but seems excessive P AB, vent, start mycophenolate, cont CyA,     LOS: 2 days   Fayrene FearingJames Onnie Alatorre 07/01/2017,7:01 AM

## 2017-07-02 ENCOUNTER — Inpatient Hospital Stay (HOSPITAL_COMMUNITY): Payer: Medicare Other

## 2017-07-02 LAB — RENAL FUNCTION PANEL
Albumin: 2 g/dL — ABNORMAL LOW (ref 3.5–5.0)
Anion gap: 12 (ref 5–15)
BUN: 37 mg/dL — ABNORMAL HIGH (ref 6–20)
CHLORIDE: 103 mmol/L (ref 101–111)
CO2: 30 mmol/L (ref 22–32)
CREATININE: 1.86 mg/dL — AB (ref 0.44–1.00)
Calcium: 8.4 mg/dL — ABNORMAL LOW (ref 8.9–10.3)
GFR calc Af Amer: 40 mL/min — ABNORMAL LOW (ref >60)
GFR, EST NON AFRICAN AMERICAN: 35 mL/min — AB (ref >60)
Glucose, Bld: 71 mg/dL (ref 65–99)
Phosphorus: 3 mg/dL (ref 2.5–4.6)
Potassium: 2.7 mmol/L — CL (ref 3.5–5.1)
Sodium: 145 mmol/L (ref 135–145)

## 2017-07-02 LAB — BASIC METABOLIC PANEL
Anion gap: 13 (ref 5–15)
BUN: 37 mg/dL — ABNORMAL HIGH (ref 6–20)
CHLORIDE: 100 mmol/L — AB (ref 101–111)
CO2: 28 mmol/L (ref 22–32)
CREATININE: 1.61 mg/dL — AB (ref 0.44–1.00)
Calcium: 8.7 mg/dL — ABNORMAL LOW (ref 8.9–10.3)
GFR calc Af Amer: 48 mL/min — ABNORMAL LOW (ref >60)
GFR calc non Af Amer: 41 mL/min — ABNORMAL LOW (ref >60)
Glucose, Bld: 93 mg/dL (ref 65–99)
POTASSIUM: 3.5 mmol/L (ref 3.5–5.1)
SODIUM: 141 mmol/L (ref 135–145)

## 2017-07-02 LAB — CBC
HCT: 25.6 % — ABNORMAL LOW (ref 36.0–46.0)
Hemoglobin: 8 g/dL — ABNORMAL LOW (ref 12.0–15.0)
MCH: 29.5 pg (ref 26.0–34.0)
MCHC: 31.3 g/dL (ref 30.0–36.0)
MCV: 94.5 fL (ref 78.0–100.0)
PLATELETS: 217 10*3/uL (ref 150–400)
RBC: 2.71 MIL/uL — ABNORMAL LOW (ref 3.87–5.11)
RDW: 15.8 % — ABNORMAL HIGH (ref 11.5–15.5)
WBC: 7.3 10*3/uL (ref 4.0–10.5)

## 2017-07-02 LAB — POCT I-STAT 3, ART BLOOD GAS (G3+)
Acid-Base Excess: 6 mmol/L — ABNORMAL HIGH (ref 0.0–2.0)
Bicarbonate: 31.1 mmol/L — ABNORMAL HIGH (ref 20.0–28.0)
O2 Saturation: 92 %
TCO2: 32 mmol/L (ref 22–32)
pCO2 arterial: 47.1 mmHg (ref 32.0–48.0)
pH, Arterial: 7.427 (ref 7.350–7.450)
pO2, Arterial: 64 mmHg — ABNORMAL LOW (ref 83.0–108.0)

## 2017-07-02 LAB — GLUCOSE, CAPILLARY
GLUCOSE-CAPILLARY: 73 mg/dL (ref 65–99)
Glucose-Capillary: 70 mg/dL (ref 65–99)
Glucose-Capillary: 103 mg/dL — ABNORMAL HIGH (ref 65–99)

## 2017-07-02 LAB — MAGNESIUM: Magnesium: 2.1 mg/dL (ref 1.7–2.4)

## 2017-07-02 LAB — DIFFERENTIAL
BASOS PCT: 0 %
Basophils Absolute: 0 10*3/uL (ref 0.0–0.1)
EOS PCT: 2 %
Eosinophils Absolute: 0.1 10*3/uL (ref 0.0–0.7)
Lymphocytes Relative: 21 %
Lymphs Abs: 1.6 10*3/uL (ref 0.7–4.0)
MONO ABS: 0.3 10*3/uL (ref 0.1–1.0)
Monocytes Relative: 4 %
Neutro Abs: 5.3 10*3/uL (ref 1.7–7.7)
Neutrophils Relative %: 73 %

## 2017-07-02 LAB — IRON AND TIBC
Iron: 17 ug/dL — ABNORMAL LOW (ref 28–170)
Saturation Ratios: 10 % — ABNORMAL LOW (ref 10.4–31.8)
TIBC: 176 ug/dL — ABNORMAL LOW (ref 250–450)
UIBC: 159 ug/dL

## 2017-07-02 MED ORDER — FUROSEMIDE 10 MG/ML IJ SOLN: 40.0000 mg | Freq: Every day | INTRAMUSCULAR | Status: DC

## 2017-07-02 MED ORDER — ZOLPIDEM TARTRATE 5 MG PO TABS
5.0000 mg | ORAL_TABLET | Freq: Every evening | ORAL | Status: DC | PRN
  Administered 2017-07-04 (×2): 5 mg via ORAL
  Filled 2017-07-02 (×2): qty 1

## 2017-07-02 MED ORDER — ASPIRIN EC 81 MG PO TBEC
81.0000 mg | DELAYED_RELEASE_TABLET | Freq: Every day | ORAL | Status: DC
  Administered 2017-07-02 – 2017-07-05 (×4): 81 mg via ORAL
  Filled 2017-07-02 (×4): qty 1

## 2017-07-02 MED ORDER — FUROSEMIDE 40 MG PO TABS
40.0000 mg | ORAL_TABLET | Freq: Every day | ORAL | Status: DC
  Administered 2017-07-02 – 2017-07-03 (×2): 40 mg via ORAL
  Filled 2017-07-02 (×2): qty 1

## 2017-07-02 MED ORDER — FERUMOXYTOL INJECTION 510 MG/17 ML
510.0000 mg | Freq: Once | INTRAVENOUS | Status: AC
  Administered 2017-07-02: 510 mg via INTRAVENOUS
  Filled 2017-07-02: qty 17

## 2017-07-02 MED ORDER — POTASSIUM CHLORIDE CRYS ER 20 MEQ PO TBCR
40.0000 meq | EXTENDED_RELEASE_TABLET | ORAL | Status: AC
  Administered 2017-07-02 (×2): 40 meq via ORAL
  Filled 2017-07-02 (×3): qty 2

## 2017-07-02 MED ORDER — PREDNISONE 5 MG PO TABS
2.5000 mg | ORAL_TABLET | Freq: Every day | ORAL | Status: DC
  Administered 2017-07-02 – 2017-07-05 (×4): 2.5 mg via ORAL
  Filled 2017-07-02 (×4): qty 1

## 2017-07-02 MED ORDER — PRAVASTATIN SODIUM 40 MG PO TABS
40.0000 mg | ORAL_TABLET | Freq: Every day | ORAL | Status: DC
  Administered 2017-07-02 – 2017-07-04 (×3): 40 mg via ORAL
  Filled 2017-07-02 (×4): qty 1

## 2017-07-02 MED ORDER — CLONAZEPAM 0.5 MG PO TABS
0.5000 mg | ORAL_TABLET | Freq: Every evening | ORAL | Status: DC | PRN
  Administered 2017-07-03 – 2017-07-04 (×2): 0.5 mg via ORAL
  Filled 2017-07-02 (×2): qty 1

## 2017-07-02 NOTE — Procedures (Signed)
Spoke with patient about CPAP who stated she quit wearing her home CPAP about 3 months ago but used only a nasal pillows when she did use it.  She would like to go without it tonight but will possibly have her home nasal pillows brought in.

## 2017-07-02 NOTE — Progress Notes (Signed)
eLink Physician-Brief Progress Note Patient Name: Donna EvenerStephanie Hubers DOB: 08/20/1985 MRN: [id number]   Date of Service  07/02/2017  HPI/Events of Note  Hypokalemia  eICU Interventions  Potassium replaced     Intervention Category Intermediate Interventions: Electrolyte abnormality - evaluation and management  DETERDING,ELIZABETH 07/02/2017, 6:15 AM

## 2017-07-02 NOTE — Progress Notes (Addendum)
PULMONARY / CRITICAL CARE MEDICINE   Name: Donna Shannon MRN: [id number] DOB: 12/25/1985    ADMISSION DATE:  06/29/2017 CONSULTATION DATE:  06/29/2017  REFERRING MD:  Dr. Blinda Leatherwood   CHIEF COMPLAINT:  Hypoxia   HISTORY OF PRESENT ILLNESS: Information obtained from medical record as patient is intubated and sedated  31 year old female with PMH of Factor V Leiden, Kidney disease s/p renal transplant x 2, retroperitoneal fibrosis, requires urinary self catheterization, and CVA  7/23 underwent left salpingoopherectomy, admission 8/17-8/18 for ARDS. Transferred to [facility]   12/21 patient presents to [facility] ED with ongoing cough and dyspnea. Worked up for Right lower lobe PNA and sepsis, given IV Levaquin. Father transferred patient to [facility] for further workup. Upon arrival to ED oxygen saturation 57% on room air. Intubated. Administered Cefepime and Vancomycin. CT Chest with diffuse bilateral airspace opacities concerning ARDS. PCCM asked to admit.    PAST MEDICAL HISTORY :  She  has a past medical history of Anemia, Anxiety, Arthritis, Back pain, Chronic lower back pain, Constipation, Depression, Edema, Essential hypertension, Factor V Leiden (HCC), Frequent UTI, GERD (gastroesophageal reflux disease), Gout, High cholesterol, HTN (hypertension), Kidney disease, Migraine, Neurogenic bladder, On home oxygen therapy, Osteoporosis, Ovarian cyst (dx'd 04/22/2016), Palpitations, Pneumonia (03/2016), Renal transplant failure and rejection, Renal transplant recipient (02/18/1997), Retroperitoneal fibrosis, Seizures (HCC), Self-catheterizes urinary bladder, SOB (shortness of breath), Stroke (HCC) (1991), and Vitamin D deficiency.  PAST SURGICAL HISTORY: She  has a past surgical history that includes Pacemaker insertion ("?early 2000s"); Kidney transplant (1996; 1998); Partial nephrectomy (1990s X 5); allograph biopsy (2013); Portacath placement (Right, 11/2015); Abdominal hernia  repair ("several; when I was little"); Hernia repair; and Right oophorectomy (Right, 2001).  Allergies  Allergen Reactions  . Phenazopyridine Other (See Comments)    Caused liver functions to go down and jaundice   . Vancomycin Itching  . Versed [Midazolam] Itching  . Sulfa Antibiotics Itching    No current facility-administered medications on file prior to encounter.    Current Outpatient Medications on File Prior to Encounter  Medication Sig  . acetaZOLAMIDE (DIAMOX) 250 MG tablet Take 250 mg by mouth 2 (two) times daily.  [name] ALPRAZolam (XANAX) 0.5 MG tablet Take 1 tablet (0.5 mg total) by mouth 3 (three) times daily as needed for anxiety. May take 1-2 30-60 minutes before LP. Do not drive.  [name] aspirin EC 81 MG tablet Take 81 mg by mouth daily.  . cholecalciferol (VITAMIN D) 1000 units tablet Take 1,000 Units by mouth daily.  . clonazePAM (KLONOPIN) 0.5 MG tablet Take 0.5 mg by mouth 3 (three) times daily as needed for anxiety.   . cycloSPORINE modified (NEORAL) 25 MG capsule Take 125 mg by mouth 2 (two) times daily.  . furosemide (LASIX) 40 MG tablet Take 40 mg by mouth daily.   [name] gabapentin (NEURONTIN) 300 MG capsule Take 300 mg by mouth 3 (three) times daily.   [name] lidocaine (XYLOCAINE) 2 % jelly Apply 1 application topically daily as needed (pain).   . methylphenidate (RITALIN) 10 MG tablet Take 10 mg by mouth 3 (three) times daily.   . metoprolol tartrate (LOPRESSOR) 50 MG tablet Take 50 mg by mouth 2 (two) times daily.   . mycophenolate (CELLCEPT) 500 MG tablet TAKE 1 TABLET BY MOUTH TWICE A DAY  . MYRBETRIQ 50 MG TB24 tablet Take 25 mg by mouth daily.   . ondansetron (ZOFRAN) 8 MG tablet Take 4 mg by mouth every 8 (eight) hours as needed  for nausea or vomiting.   . pravastatin (PRAVACHOL) 40 MG tablet Take 40 mg by mouth at bedtime.  . predniSONE (DELTASONE) 2.5 MG tablet Take 2.5 mg by mouth daily with breakfast.   . promethazine (PHENERGAN) 25 MG suppository UNWRAP AND INSERT 1  SUPPOSITORY RECTALLY EVERY 6 HOURS AS NEEDED FOR NAUSEA OR VOMITING  . ranitidine (ZANTAC) 75 MG tablet Take 75 mg by mouth daily.   . rizatriptan (MAXALT) 10 MG tablet Take 10 mg by mouth daily as needed for migraine. May repeat in 2 hours if needed  . sertraline (ZOLOFT) 100 MG tablet Take 100 mg by mouth daily.   . sodium bicarbonate 650 MG tablet Take 1,300 mg by mouth 2 (two) times daily.  [name]. zolpidem (AMBIEN) 5 MG tablet Take 5 mg by mouth at bedtime.    FAMILY HISTORY:  Her indicated that her mother is alive. She indicated that her father is alive.   SOCIAL HISTORY: She  reports that she quit smoking about 2 years ago. Her smoking use included cigarettes. She has a 2.00 pack-year smoking history. she has never used smokeless tobacco. She reports that she drinks alcohol. She reports that she does not use drugs.  REVIEW OF SYSTEMS:   Unable to review as patient is intubated and sedated   SUBJECTIVE:  Extubated yesterday.  Stable respiratory status No new complaints today  VITAL SIGNS: BP (!) 148/88   Pulse 79   Temp 99.1 F (37.3 C) (Oral)   Resp (!) 34   Ht 5\' 2"  (1.575 m)   Wt 161 lb 13.1 oz (73.4 kg)   LMP 06/08/2017   SpO2 94%   BMI 29.60 kg/m   HEMODYNAMICS:    VENTILATOR SETTINGS: Vent Mode: PSV;CPAP FiO2 (%):  [50 %] 50 % Set Rate:  [22 bmp-30 bmp] 30 bmp Vt Set:  [400 mL] 400 mL PEEP:  [5 cmH20] 5 cmH20 Pressure Support:  [5 cmH20-10 cmH20] 5 cmH20 Plateau Pressure:  [15 cmH20-24 cmH20] 15 cmH20  INTAKE / OUTPUT: I/O last 3 completed shifts: In: 1727.1 [I.V.:286.3; Other:110; NG/GT:180; IV Piggyback:1150.8] Out: 2905 [Urine:2805; Emesis/NG output:100]  PHYSICAL EXAMINATION: Gen:      No acute distress HEENT:  EOMI, sclera anicteric Neck:     No masses; no thyromegaly Lungs:    Clear to auscultation bilaterally; normal respiratory effort CV:         Regular rate and rhythm; no murmurs Abd:      + bowel sounds; soft, non-tender; no palpable masses,  no distension Ext:    No edema; adequate peripheral perfusion Skin:      Warm and dry; no rash Neuro: Somnolent, no focal deficits  LABS:  BMET Recent Labs  Lab 06/30/17 1847 07/01/17 0316 07/02/17 0533  NA 141 141 145  K 3.0* 3.6 2.7*  CL 99* 99* 103  CO2 32 30 30  BUN 35* 36* 37*  CREATININE 2.12* 2.20* 1.86*  GLUCOSE 105* 95 71    Electrolytes Recent Labs  Lab 06/30/17 0720 06/30/17 0758 06/30/17 1847 07/01/17 0316 07/02/17 0533  CALCIUM  --  7.0* 7.3* 8.0* 8.4*  MG 2.0  --   --  2.1 2.1  PHOS  --  3.3  --  6.2* 3.0    CBC Recent Labs  Lab 06/30/17 0720 06/30/17 1848 07/01/17 0316 07/02/17 0534  WBC 9.0  --  9.8 7.3  HGB 6.8* 8.3* 8.4* 8.0*  HCT 21.6* 25.6* 26.9* 25.6*  PLT 182  --  201 217  Coag's Recent Labs  Lab 06/29/17 0606  INR 1.33    Sepsis Markers Recent Labs  Lab 06/29/17 0057 06/29/17 0247 06/29/17 0609 07/01/17 0316  LATICACIDVEN 0.80 0.9 1.0  --   PROCALCITON  --  3.23  --  3.85    ABG Recent Labs  Lab 06/30/17 0940 07/01/17 0339 07/02/17 0459  PHART 7.363 7.319* 7.427  PCO2ART 64.4* 68.0* 47.1  PO2ART 84.6 127.0* 64.0*    Liver Enzymes Recent Labs  Lab 06/29/17 0047 06/30/17 0758 07/01/17 0316 07/02/17 0533  AST 54*  --   --   --   ALT 21  --   --   --   ALKPHOS 42  --   --   --   BILITOT 0.9  --   --   --   ALBUMIN 2.8* 1.8* 2.0* 2.0*    Cardiac Enzymes Recent Labs  Lab 06/29/17 0247 06/29/17 0607 06/29/17 1651  TROPONINI <0.03 <0.03 <0.03    Glucose Recent Labs  Lab 06/29/17 0645 06/29/17 0745 07/01/17 1933  GLUCAP 73 84 81    Imaging Dg Chest Port 1 View  Result Date: 07/02/2017 CLINICAL DATA:  ET tube, respiratory failure EXAM: PORTABLE CHEST 1 VIEW COMPARISON:  07/01/2017 FINDINGS: Interval removal of endotracheal tube and NG tube. Left Port-A-Cath remains in place, unchanged. Cardiomegaly. Diffuse bilateral airspace disease slightly worsened since prior study. IMPRESSION: Interval  extubation. Diffuse bilateral airspace disease has slightly worsened. Electronically Signed   By: Charlett Nose M.D.   On: 07/02/2017 07:15    STUDIES:  CT Chest 12/22 > Diffuse bilateral airspace opacification, more dense at the lower lung lobes, concerning for ARDS. Underlying pneumonia or flash pulmonary edema cannot be excluded I have reviewed all images personally  CULTURES: Blood 12/22 >> negative U/A 12/22 >> negative Sputum 12/22 >> candida RVP 12/22 > mycoplasma pneumonia Urine legionella, pneumococcus 12/22 > negative  ANTIBIOTICS: Cefepime 12/22 >> Vancomycin 12/22 >> 12/24 Azithromycin 12/22>>  SIGNIFICANT EVENTS: 12/21 > Presents to OSH   LINES/TUBES: ETT 12/22 >>   DISCUSSION: 31 year old female with recent ARDS presents hypoxic with bilateral airspace opacifications, Mycoplasma PNA requiring intubation.   ASSESSMENT / PLAN:  PULMONARY A: Acute Hypoxic/Hypercarbic Respiratory Failure with diffuse bilateral airspace opacifications concerning for ARDS ABG shows compensated hypercarbia H/O severe OSA. She stopped CPAP after loosing weight this year P:   Wean down O2 as tolerated Start CPAP at night for OSA Follow Chest x ray  CARDIOVASCULAR A:  Septic Shock  H/O HLD  P:  Cardiac Monitoring  Resume Pravastatin, ASA Holding lopressor due to low HRs  RENAL A:   Non-Gap Metabolic Acidosis > resolved LA 0.9  CKD (Crt 2.2-2.5)  S/P Bilateral Renal Transplants  P:   Replace electrolytes as needed.Repeat BMP today afternoon  Resume home lasix  GASTROINTESTINAL A:   Retroperitoneal fibrosis S/P Left salpingoophrectomy 01/2017 P:   PO diet  PPI  HEMATOLOGIC A:   Factor V Leiden  P:  Trend CBC SQ Heparin   INFECTIOUS A:   Mycoplasma pneumonia Immunocompromised (on cellcept, prednisone, cyclosporine)  H/O Recurrent UTI  P:   Follow Pct, cultures On cefepime and azithro. Off vanco  ENDOCRINE A:   No issues    P:   Trend Glucose   Stress-Dose Steroids (on Chronic Prednisone).  Reduce stress dose steroids to home regimen of prednisone  NEUROLOGIC A:   Metabolic Encephalopathy  P:   Off sedation.  Monitor mental status Hold Neurontin, ritalin,  Ambien,  klonopin PRN at night  FAMILY  - Updates: Family updated at bedside  - Inter-disciplinary family meet or Palliative Care meeting due by: 07/06/2017  Stable for transfer from ICU.  Chilton GreathousePraveen Eilis Chestnutt MD [facility] Pager [phone] If no answer or after 3pm call: [phone] 07/02/2017, 7:59 AM

## 2017-07-02 NOTE — Progress Notes (Signed)
07/02/2017 1415 Received pt to room 4E11 from medical ICU.  Pt is A&O, no C/O voiced.  Tele monitor applied and CCMD notified.  Oriented to room, call light and bed.  Call bell in reach, family at bedside. Kathryne HitchAllen, Vasiliki Smaldone C

## 2017-07-02 NOTE — Progress Notes (Signed)
Pharmacy Antibiotic Note  Donna EvenerStephanie Shannon is a 31 y.o. female admitted on 06/29/2017 with pneumonia.  Pharmacy continues on cefepime. Pt wit hx of kidney transplant with elevated Scr but now trending down. She is also on azithromycin.    Plan: Continue cefepime 1gm IV Q24H F/u renal fxn, C&S, clinical status F/u planned LOT  Height: 5\' 2"  (157.5 cm) Weight: 161 lb 13.1 oz (73.4 kg) IBW/kg (Calculated) : 50.1  Temp (24hrs), Avg:98.8 F (37.1 C), Min:98.3 F (36.8 C), Max:99.1 F (37.3 C)  Recent Labs  Lab 06/29/17 0047 06/29/17 0057 06/29/17 0247 06/29/17 0606 06/29/17 0609 06/30/17 0720 06/30/17 0758 06/30/17 1847 07/01/17 0316 07/02/17 0533 07/02/17 0534  WBC 20.8*  --   --  16.2*  --  9.0  --   --  9.8  --  7.3  CREATININE 2.56*  --   --  2.56*  --   --  2.28* 2.12* 2.20* 1.86*  --   LATICACIDVEN  --  0.80 0.9  --  1.0  --   --   --   --   --   --     Estimated Creatinine Clearance: 40.3 mL/min (A) (by C-G formula based on SCr of 1.86 mg/dL (H)).    Allergies  Allergen Reactions  . Phenazopyridine Other (See Comments)    Caused liver functions to go down and jaundice   . Vancomycin Itching  . Versed [Midazolam] Itching  . Sulfa Antibiotics Itching   Donna Shannon, Donna Shannon 07/02/2017 9:03 AM

## 2017-07-02 NOTE — Progress Notes (Signed)
Subjective: Interval History: has no complaint .  Objective: Vital signs in last 24 hours: Temp:  [98.3 F (36.8 C)-99.1 F (37.3 C)] 98.9 F (37.2 C) (12/25 0318) Pulse Rate:  [58-93] 68 (12/25 0600) Resp:  [10-31] 29 (12/25 0600) BP: (128-151)/(81-98) 137/98 (12/25 0600) SpO2:  [90 %-98 %] 94 % (12/25 0600) FiO2 (%):  [50 %] 50 % (12/24 1520) Weight:  [73.4 kg (161 lb 13.1 oz)] 73.4 kg (161 lb 13.1 oz) (12/25 0500) Weight change: -3.2 kg (-0.9 oz)  Intake/Output from previous day: 12/24 0701 - 12/25 0700 In: 963.3 [I.V.:122.5; NG/GT:180; IV Piggyback:550.8] Out: 1825 [Urine:1825] Intake/Output this shift: No intake/output data recorded.  General appearance: alert, cooperative, no distress, mildly obese and pale Resp: rales bibasilar and rhonchi bibasilar Cardio: S1, S2 normal and systolic murmur: systolic ejection 2/6, decrescendo at 2nd left intercostal space GI: obese, pos bs  TX ok Extremities: edema 2+  Lab Results: Recent Labs    07/01/17 0316 07/02/17 0534  WBC 9.8 7.3  HGB 8.4* 8.0*  HCT 26.9* 25.6*  PLT 201 217   BMET:  Recent Labs    07/01/17 0316 07/02/17 0533  NA 141 145  K 3.6 2.7*  CL 99* 103  CO2 30 30  GLUCOSE 95 71  BUN 36* 37*  CREATININE 2.20* 1.86*  CALCIUM 8.0* 8.4*   No results for input(s): PTH in the last 72 hours. Iron Studies: No results for input(s): IRON, TIBC, TRANSFERRIN, FERRITIN in the last 72 hours.  Studies/Results: Dg Chest Port 1 View  Result Date: 07/01/2017 CLINICAL DATA:  Followup ventilator support EXAM: PORTABLE CHEST 1 VIEW COMPARISON:  06/30/2017 FINDINGS: Endotracheal tube tip is 2.5 cm above the carina. Nasogastric tube enters the stomach. Power port tip remains in the right atrium. Bilateral pulmonary infiltrates persist without dense consolidation. No new finding. IMPRESSION: Persistent bilateral pneumonia.  No new finding. Electronically Signed   By: Paulina FusiMark  Shogry M.D.   On: 07/01/2017 06:58    I have  reviewed the patient's current medications. Prior to Admission:  Medications Prior to Admission  Medication Sig Dispense Refill Last Dose  . acetaZOLAMIDE (DIAMOX) 250 MG tablet Take 250 mg by mouth 2 (two) times daily.   06/28/2017 at am  . ALPRAZolam (XANAX) 0.5 MG tablet Take 1 tablet (0.5 mg total) by mouth 3 (three) times daily as needed for anxiety. May take 1-2 30-60 minutes before LP. Do not drive. 30 tablet 0 unk  . aspirin EC 81 MG tablet Take 81 mg by mouth daily.   06/28/2017 at 0800  . cholecalciferol (VITAMIN D) 1000 units tablet Take 1,000 Units by mouth daily.   06/28/2017 at Unknown time  . clonazePAM (KLONOPIN) 0.5 MG tablet Take 0.5 mg by mouth 3 (three) times daily as needed for anxiety.    unk  . cycloSPORINE modified (NEORAL) 25 MG capsule Take 125 mg by mouth 2 (two) times daily.     . furosemide (LASIX) 40 MG tablet Take 40 mg by mouth daily.    06/28/2017 at Unknown time  . gabapentin (NEURONTIN) 300 MG capsule Take 300 mg by mouth 3 (three) times daily.    06/28/2017 at Unknown time  . lidocaine (XYLOCAINE) 2 % jelly Apply 1 application topically daily as needed (pain).   3 unk  . methylphenidate (RITALIN) 10 MG tablet Take 10 mg by mouth 3 (three) times daily.    06/28/2017 at Unknown time  . metoprolol tartrate (LOPRESSOR) 50 MG tablet Take 50 mg by  mouth 2 (two) times daily.    06/28/2017 at 0800  . mycophenolate (CELLCEPT) 500 MG tablet TAKE 1 TABLET BY MOUTH TWICE A DAY   06/28/2017 at am  . MYRBETRIQ 50 MG TB24 tablet Take 25 mg by mouth daily.    06/28/2017 at Unknown time  . ondansetron (ZOFRAN) 8 MG tablet Take 4 mg by mouth every 8 (eight) hours as needed for nausea or vomiting.    unk  . pravastatin (PRAVACHOL) 40 MG tablet Take 40 mg by mouth at bedtime.   06/27/2017  . predniSONE (DELTASONE) 2.5 MG tablet Take 2.5 mg by mouth daily with breakfast.    06/28/2017 at Unknown time  . promethazine (PHENERGAN) 25 MG suppository UNWRAP AND INSERT 1 SUPPOSITORY  RECTALLY EVERY 6 HOURS AS NEEDED FOR NAUSEA OR VOMITING 12 suppository 0 unk  . ranitidine (ZANTAC) 75 MG tablet Take 75 mg by mouth daily.    06/28/2017 at Unknown time  . rizatriptan (MAXALT) 10 MG tablet Take 10 mg by mouth daily as needed for migraine. May repeat in 2 hours if needed   unk  . sertraline (ZOLOFT) 100 MG tablet Take 100 mg by mouth daily.    06/28/2017 at Unknown time  . sodium bicarbonate 650 MG tablet Take 1,300 mg by mouth 2 (two) times daily.   06/28/2017 at am  . zolpidem (AMBIEN) 5 MG tablet Take 5 mg by mouth at bedtime.   06/27/2017    Assessment/Plan: 1 Renal Tx with AKI improving, nonoliguric, wasting K.  On her immunosuppress.  Will need to lower steroids soon. 2 HTN allow to diuese 3 Anemia 4 Neurogenic bladder 4 CO2 retention need to understand why is so bad,  Her bicarb is compensation for that. 5 Obesity 6 Pneu on Cefapine/azithro P AB, allow to diurese,  Check Fe,  pulm toilet.     LOS: 3 days   Fayrene FearingJames Yamile Roedl 07/02/2017,7:12 AM

## 2017-07-02 NOTE — Progress Notes (Signed)
K 2.7 this am, ELink called new orders pending.

## 2017-07-03 ENCOUNTER — Inpatient Hospital Stay (HOSPITAL_COMMUNITY): Payer: Medicare Other

## 2017-07-03 ENCOUNTER — Other Ambulatory Visit: Payer: Self-pay

## 2017-07-03 DIAGNOSIS — E872 Acidosis: Secondary | ICD-10-CM

## 2017-07-03 DIAGNOSIS — Z79899 Other long term (current) drug therapy: Secondary | ICD-10-CM

## 2017-07-03 DIAGNOSIS — G4733 Obstructive sleep apnea (adult) (pediatric): Secondary | ICD-10-CM

## 2017-07-03 DIAGNOSIS — K219 Gastro-esophageal reflux disease without esophagitis: Secondary | ICD-10-CM

## 2017-07-03 DIAGNOSIS — N189 Chronic kidney disease, unspecified: Secondary | ICD-10-CM

## 2017-07-03 DIAGNOSIS — I10 Essential (primary) hypertension: Secondary | ICD-10-CM

## 2017-07-03 DIAGNOSIS — N179 Acute kidney failure, unspecified: Secondary | ICD-10-CM

## 2017-07-03 DIAGNOSIS — J157 Pneumonia due to Mycoplasma pneumoniae: Secondary | ICD-10-CM

## 2017-07-03 DIAGNOSIS — A419 Sepsis, unspecified organism: Secondary | ICD-10-CM | POA: Diagnosis present

## 2017-07-03 DIAGNOSIS — E6609 Other obesity due to excess calories: Secondary | ICD-10-CM

## 2017-07-03 DIAGNOSIS — J80 Acute respiratory distress syndrome: Secondary | ICD-10-CM

## 2017-07-03 DIAGNOSIS — Z6834 Body mass index (BMI) 34.0-34.9, adult: Secondary | ICD-10-CM

## 2017-07-03 DIAGNOSIS — E8729 Other acidosis: Secondary | ICD-10-CM

## 2017-07-03 DIAGNOSIS — Z940 Kidney transplant status: Secondary | ICD-10-CM

## 2017-07-03 DIAGNOSIS — R6521 Severe sepsis with septic shock: Secondary | ICD-10-CM

## 2017-07-03 DIAGNOSIS — N319 Neuromuscular dysfunction of bladder, unspecified: Secondary | ICD-10-CM

## 2017-07-03 DIAGNOSIS — G9341 Metabolic encephalopathy: Secondary | ICD-10-CM | POA: Diagnosis present

## 2017-07-03 LAB — GLUCOSE, CAPILLARY
Glucose-Capillary: 77 mg/dL (ref 65–99)
Glucose-Capillary: 82 mg/dL (ref 65–99)
Glucose-Capillary: 94 mg/dL (ref 65–99)
Glucose-Capillary: 109 mg/dL — ABNORMAL HIGH (ref 65–99)

## 2017-07-03 LAB — RENAL FUNCTION PANEL
ALBUMIN: 2.3 g/dL — AB (ref 3.5–5.0)
Anion gap: 12 (ref 5–15)
BUN: 25 mg/dL — ABNORMAL HIGH (ref 6–20)
CALCIUM: 8.7 mg/dL — AB (ref 8.9–10.3)
CO2: 28 mmol/L (ref 22–32)
CREATININE: 1.16 mg/dL — AB (ref 0.44–1.00)
Chloride: 101 mmol/L (ref 101–111)
GFR calc Af Amer: >60 mL/min (ref >60)
GFR calc non Af Amer: >60 mL/min (ref >60)
GLUCOSE: 136 mg/dL — AB (ref 65–99)
PHOSPHORUS: 1.4 mg/dL — AB (ref 2.5–4.6)
Potassium: 2.9 mmol/L — ABNORMAL LOW (ref 3.5–5.1)
SODIUM: 141 mmol/L (ref 135–145)

## 2017-07-03 LAB — CBC
HEMATOCRIT: 27 % — AB (ref 36.0–46.0)
HEMOGLOBIN: 9 g/dL — AB (ref 12.0–15.0)
MCH: 32.1 pg (ref 26.0–34.0)
MCHC: 33.3 g/dL (ref 30.0–36.0)
MCV: 96.4 fL (ref 78.0–100.0)
Platelets: 222 10*3/uL (ref 150–400)
RBC: 2.8 MIL/uL — ABNORMAL LOW (ref 3.87–5.11)
RDW: 16 % — ABNORMAL HIGH (ref 11.5–15.5)
WBC: 5.6 10*3/uL (ref 4.0–10.5)

## 2017-07-03 LAB — MAGNESIUM: Magnesium: 1.6 mg/dL — ABNORMAL LOW (ref 1.7–2.4)

## 2017-07-03 LAB — CYCLOSPORINE: Cyclosporine, LabCorp: 43 ng/mL — ABNORMAL LOW (ref 100–400)

## 2017-07-03 MED ORDER — MAGNESIUM SULFATE IN D5W 1-5 GM/100ML-% IV SOLN
1.0000 g | Freq: Once | INTRAVENOUS | Status: AC
  Administered 2017-07-03: 1 g via INTRAVENOUS
  Filled 2017-07-03: qty 100

## 2017-07-03 MED ORDER — BUDESONIDE 0.5 MG/2ML IN SUSP
0.5000 mg | Freq: Two times a day (BID) | RESPIRATORY_TRACT | Status: DC
  Administered 2017-07-03 – 2017-07-05 (×4): 0.5 mg via RESPIRATORY_TRACT
  Filled 2017-07-03 (×5): qty 2

## 2017-07-03 MED ORDER — POTASSIUM CHLORIDE CRYS ER 20 MEQ PO TBCR
40.0000 meq | EXTENDED_RELEASE_TABLET | ORAL | Status: AC
  Administered 2017-07-03 (×2): 40 meq via ORAL
  Filled 2017-07-03 (×2): qty 2

## 2017-07-03 MED ORDER — FLUCONAZOLE IN SODIUM CHLORIDE 200-0.9 MG/100ML-% IV SOLN
200.0000 mg | Freq: Once | INTRAVENOUS | Status: AC
  Administered 2017-07-03: 200 mg via INTRAVENOUS
  Filled 2017-07-03: qty 100

## 2017-07-03 MED ORDER — POTASSIUM CHLORIDE CRYS ER 20 MEQ PO TBCR
40.0000 meq | EXTENDED_RELEASE_TABLET | Freq: Once | ORAL | Status: AC
  Administered 2017-07-03: 40 meq via ORAL
  Filled 2017-07-03: qty 2

## 2017-07-03 MED ORDER — AMLODIPINE BESYLATE 10 MG PO TABS
10.0000 mg | ORAL_TABLET | Freq: Every day | ORAL | Status: DC
  Administered 2017-07-04: 10 mg via ORAL
  Filled 2017-07-03: qty 1

## 2017-07-03 MED ORDER — FUROSEMIDE 40 MG PO TABS
40.0000 mg | ORAL_TABLET | Freq: Two times a day (BID) | ORAL | Status: DC
  Administered 2017-07-03 – 2017-07-05 (×4): 40 mg via ORAL
  Filled 2017-07-03 (×4): qty 1

## 2017-07-03 MED ORDER — GUAIFENESIN ER 600 MG PO TB12
1200.0000 mg | ORAL_TABLET | Freq: Two times a day (BID) | ORAL | Status: DC
  Administered 2017-07-03 – 2017-07-05 (×5): 1200 mg via ORAL
  Filled 2017-07-03 (×5): qty 2

## 2017-07-03 MED ORDER — AMLODIPINE BESYLATE 5 MG PO TABS: 5.0000 mg | ORAL_TABLET | Freq: Every day | ORAL | Status: DC

## 2017-07-03 MED ORDER — MIRABEGRON ER 25 MG PO TB24
25.0000 mg | ORAL_TABLET | Freq: Every day | ORAL | Status: DC
  Administered 2017-07-03 – 2017-07-05 (×3): 25 mg via ORAL
  Filled 2017-07-03 (×3): qty 1

## 2017-07-03 MED ORDER — HYDRALAZINE HCL 20 MG/ML IJ SOLN
5.0000 mg | Freq: Once | INTRAMUSCULAR | Status: AC
  Administered 2017-07-03: 5 mg via INTRAVENOUS
  Filled 2017-07-03: qty 1

## 2017-07-03 MED ORDER — POTASSIUM PHOSPHATES 15 MMOLE/5ML IV SOLN
30.0000 mmol | Freq: Once | INTRAVENOUS | Status: AC
  Administered 2017-07-03: 30 mmol via INTRAVENOUS
  Filled 2017-07-03: qty 10

## 2017-07-03 MED ORDER — FLUCONAZOLE 100 MG PO TABS
100.0000 mg | ORAL_TABLET | Freq: Every day | ORAL | Status: DC
  Administered 2017-07-04 – 2017-07-05 (×2): 100 mg via ORAL
  Filled 2017-07-03 (×2): qty 1

## 2017-07-03 MED ORDER — POTASSIUM CHLORIDE CRYS ER 20 MEQ PO TBCR
40.0000 meq | EXTENDED_RELEASE_TABLET | Freq: Once | ORAL | Status: DC
  Filled 2017-07-03: qty 2

## 2017-07-03 MED ORDER — IPRATROPIUM-ALBUTEROL 0.5-2.5 (3) MG/3ML IN SOLN
3.0000 mL | Freq: Four times a day (QID) | RESPIRATORY_TRACT | Status: DC
  Administered 2017-07-03 (×2): 3 mL via RESPIRATORY_TRACT
  Filled 2017-07-03 (×2): qty 3

## 2017-07-03 NOTE — Progress Notes (Signed)
Provided ABI Vest CPT, pt tolerated well.  Also provided Flutter device for pt to use on own.  Pt demonstrated good effort and technique with flutter, as well as IS, at bedside.  Continue per order

## 2017-07-03 NOTE — Progress Notes (Addendum)
PROGRESS NOTE    Donna Shannon  UJW:[id number] DOB: 12-Jul-1985 DOA: 06/29/2017 PCP: Patient, No Pcp Per    Brief Narrative:  31 year old female with PMH of Factor V Leiden, Kidney disease s/p renal transplant x 2, retroperitoneal fibrosis, requires urinary self catheterization, and CVA  7/23 underwent left salpingoopherectomy, admission 8/17-8/18 for ARDS. Transferred to [facility]   12/21 patient presents to [facility] ED with ongoing cough and dyspnea. Worked up for Right lower lobe PNA and sepsis, given IV Levaquin. Father transferred patient to [facility] for further workup. Upon arrival to ED oxygen saturation 57% on room air. Intubated. Administered Cefepime and Vancomycin. CT Chest with diffuse bilateral airspace opacities concerning ARDS. PCCM asked to admit.       Assessment & Plan:   Principal Problem:   Acute respiratory failure with hypoxia and hypercapnia (HCC) Active Problems:   ARDS (adult respiratory distress syndrome) (HCC)   Mycoplasma pneumoniae pneumonia   Obesity   Kidney transplant status, living related donor   Neurogenic bladder   Hypertensive disorder   Stage 3 chronic kidney disease (HCC)   OSA (obstructive sleep apnea)   Retroperitoneal fibrosis   Vitamin D deficiency   GERD (gastroesophageal reflux disease)   Long-term use of immunosuppressant medication   Acute respiratory failure with hypoxia (HCC)   Septic shock (HCC)   Acute metabolic encephalopathy  #1 acute respiratory failure with hypoxia and hypercapnia with respiratory acidosis concerning for ARDS/mycoplasma pneumonia in the immunocompromised patient with history of severe obstructive sleep apnea Patient was admitted placed on the critical care service.  Patient pancultured and intubated on admission.  Blood cultures and urine cultures negative to date.  Sputum cultures positive for Candida.  Respiratory viral panel positive for mycoplasma pneumonia.  Urine Legionella and  urine pneumococcus antigens were negative.  MRSA PCR negative.  Patient initially placed empirically on IV vancomycin, cefepime and azithromycin.  Vancomycin subsequently discontinued 07/01/2017 and patient currently on IV azithromycin and cefepime.  Patient extubated 07/01/2017 and currently on nasal cannula.  Chest x-ray unchanged with diffuse bilateral infiltrates still concerning for ARDS.  Patient very rhonchorous on examination.  She is noted to have a urine output of 3.7 L over the past 24 hours.  Continue empiric IV azithromycin and IV cefepime.  Continue oral prednisone 25 mg daily.  Continue home dose oral Lasix.  Monitor urine output.  May need an extra dose of IV Lasix however will hold off for now.  Add Mucinex and scheduled nebulizer treatments, Pulmicort, chest PT.  Noted to have refused CPAP overnight.  Follow.  2.  Septic shock Secondary to problem #1.  Not on pressors.  Blood pressure now elevated.  Lopressor was held due to low heart rates.  Blood cultures with no growth to date.  Urine Legionella and urine pneumococcus antigen were negative.  Orrester a viral panel positive for mycoplasma pneumonia.  Sputum cultures positive for Candida.  Continue empiric IV antibiotics for now.  We will add oral fluconazole as patient also noted to be immunocompromised.  Follow.  3.  Hypertension Blood pressure elevated this morning.  The patient's home dose Lopressor held due to bradycardia.  Continue Lasix.  Will start Norvasc 5 mg daily and follow.  4.  Acute metabolic encephalopathy Likely secondary to problem #1.  Improved.  See problem #1.  5.  History of hyperlipidemia Continue statin.  6.  Factor V Leiden Outpatient follow-up.  7.  Acute renal failure on chronic kidney disease stage III/status  post kidney transplant.  Baseline creatinine 1.6-1.8. Likely secondary to a prerenal azotemia as patient noted to have presented in septic shock.  Improving.  Patient with a urine output of 3.7 L  over the past 24 hours.  Continue current dose of prednisone, cyclosporine, CellCept.  Continue home dose Lasix.  Nephrology following.  Follow.  8.  Hypophosphatemia/hypomagnesemia/hypokalemia Replete.  9.  Iron deficiency anemia H&H stable. s/p IV Feraheme.  Nephrology following.  10.  Depression Stable.  Continue home regimen Zoloft.  11.  Neurogenic bladder  12.  Obesity Patient follows up in the outpatient setting at the weight loss clinic.  13.  Gastroesophageal reflux disease.   PPI.    DVT prophylaxis: Heparin Code Status: Full Family Communication: Updated patient.  No family at bedside. Disposition Plan: Remain in stepdown unit.   Consultants:   [facility]  Nephrology: Dr. Allena KatzPatel 06/29/2017  Procedures:  CT Chest 12/22 > Diffuse bilateral airspace opacification, more dense at the lower lung lobes, concerning for ARDS. Underlying pneumonia or flash pulmonary edema cannot be excluded Chest x-ray 06/29/2017, 06/30/2017, 07/01/2017, 07/02/2017, 07/03/2017 2D echo 06/29/2017  CULTURES: Blood 12/22 >> negative U/A 12/22 >> negative Sputum 12/22 >> candida RVP 12/22 > mycoplasma pneumonia Urine legionella, pneumococcus 12/22 > negative  LINES/TUBES: ETT 12/22 >>  12/24   Antimicrobials:  Cefepime 12/22 >> Vancomycin 12/22 >> 12/24 Azithromycin 12/22>>      Subjective: Patient on nasal cannula still with complaints of shortness of breath and cough.  Patient however feels shortness of breath is slowly improving since admission.  Denies any chest pain.  No abdominal pain.  Tolerating current oral intake.  It was noted that patient refused CPAP overnight.  Objective: Vitals:   07/03/17 0400 07/03/17 0510 07/03/17 0829 07/03/17 0830  BP: (!) 155/90  (!) 173/95 (!) 175/98  Pulse: (!) 52 (!) 36 (!) 59 (!) 56  Resp: 20 20 (!) 22 (!) 21  Temp: 98.4 F (36.9 C)   98.5 F (36.9 C)  TempSrc: Oral   Oral  SpO2: 96% 99% 99% 97%  Weight:   69.2 kg (152 lb 8 oz)    Height:        Intake/Output Summary (Last 24 hours) at 07/03/2017 1028 Last data filed at 07/03/2017 0600 Gross per 24 hour  Intake 370 ml  Output 3700 ml  Net -3330 ml   Filed Weights   07/01/17 0630 07/02/17 0500 07/03/17 0510  Weight: 76.6 kg (168 lb 14 oz) 73.4 kg (161 lb 13.1 oz) 69.2 kg (152 lb 8 oz)    Examination:  General exam: Appears calm and comfortable  Respiratory system: Diffuse coarse rhonchorous breath sounds.  No wheezing.  On nasal cannula.  Cardiovascular system: S1 & S2 heard, RRR. No JVD, murmurs, rubs, gallops or clicks. No pedal edema. Gastrointestinal system: Abdomen is nondistended, soft and nontender. No organomegaly or masses felt. Normal bowel sounds heard. Central nervous system: Alert and oriented. No focal neurological deficits. Extremities: Symmetric 5 x 5 power. Skin: No rashes, lesions or ulcers Psychiatry: Judgement and insight appear fair. Mood & affect appropriate.     Data Reviewed: I have personally reviewed following labs and imaging studies  CBC: Recent Labs  Lab 06/29/17 0047 06/29/17 0606 06/30/17 0720 06/30/17 1848 07/01/17 0316 07/02/17 0534 07/03/17 0922  WBC 20.8* 16.2* 9.0  --  9.8 7.3 5.6  NEUTROABS 18.3*  --   --   --   --  5.3  --   HGB  10.1* 9.0* 6.8* 8.3* 8.4* 8.0* 9.0*  HCT 33.9* 30.6* 21.6* 25.6* 26.9* 25.6* 27.0*  MCV 94.7 95.6 90.0  --  92.8 94.5 96.4  PLT 279 240 182  --  201 217 222   Basic Metabolic Panel: Recent Labs  Lab 06/29/17 0606 06/30/17 0720 06/30/17 0758 06/30/17 1847 07/01/17 0316 07/02/17 0533 07/02/17 1531 07/03/17 0922  NA 141  --  140 141 141 145 141 141  K 4.3  --  2.4* 3.0* 3.6 2.7* 3.5 2.9*  CL 112*  --  98* 99* 99* 103 100* 101  CO2 18*  --  32 32 30 30 28 28   GLUCOSE 78  --  145* 105* 95 71 93 136*  BUN 43*  --  35* 35* 36* 37* 37* 25*  CREATININE 2.56*  --  2.28* 2.12* 2.20* 1.86* 1.61* 1.16*  CALCIUM 7.6*  --  7.0* 7.3* 8.0* 8.4* 8.7* 8.7*  MG  1.5* 2.0  --   --  2.1 2.1  --  1.6*  PHOS 7.8*  --  3.3  --  6.2* 3.0  --  1.4*   GFR: Estimated Creatinine Clearance: 62.8 mL/min (A) (by C-G formula based on SCr of 1.16 mg/dL (H)). Liver Function Tests: Recent Labs  Lab 06/29/17 0047 06/30/17 0758 07/01/17 0316 07/02/17 0533 07/03/17 0922  AST 54*  --   --   --   --   ALT 21  --   --   --   --   ALKPHOS 42  --   --   --   --   BILITOT 0.9  --   --   --   --   PROT 6.4*  --   --   --   --   ALBUMIN 2.8* 1.8* 2.0* 2.0* 2.3*   No results for input(s): LIPASE, AMYLASE in the last 168 hours. No results for input(s): AMMONIA in the last 168 hours. Coagulation Profile: Recent Labs  Lab 06/29/17 0606  INR 1.33   Cardiac Enzymes: Recent Labs  Lab 06/29/17 0247 06/29/17 0607 06/29/17 1651  TROPONINI <0.03 <0.03 <0.03   BNP (last 3 results) No results for input(s): PROBNP in the last 8760 hours. HbA1C: No results for input(s): HGBA1C in the last 72 hours. CBG: Recent Labs  Lab 07/01/17 1933 07/02/17 0833 07/02/17 1136 07/02/17 2108 07/03/17 0600  GLUCAP 81 70 103* 73 77   Lipid Profile: No results for input(s): CHOL, HDL, LDLCALC, TRIG, CHOLHDL, LDLDIRECT in the last 72 hours. Thyroid Function Tests: No results for input(s): TSH, T4TOTAL, FREET4, T3FREE, THYROIDAB in the last 72 hours. Anemia Panel: Recent Labs    07/02/17 0821  TIBC 176*  IRON 17*   Sepsis Labs: Recent Labs  Lab 06/29/17 0057 06/29/17 0247 06/29/17 0609 07/01/17 0316  PROCALCITON  --  3.23  --  3.85  LATICACIDVEN 0.80 0.9 1.0  --     Recent Results (from the past 240 hour(s))  Blood Culture (routine x 2)     Status: None (Preliminary result)   Collection Time: 06/29/17 12:44 AM  Result Value Ref Range Status   Specimen Description BLOOD RIGHT FOREARM  Final   Special Requests   Final    BOTTLES DRAWN AEROBIC AND ANAEROBIC Blood Culture adequate volume   Culture NO GROWTH 3 DAYS  Final   Report Status PENDING  Incomplete    Blood Culture (routine x 2)     Status: None (Preliminary result)   Collection Time: 06/29/17  1:03 AM  Result Value Ref Range Status   Specimen Description BLOOD RIGHT HAND  Final   Special Requests IN PEDIATRIC BOTTLE Blood Culture adequate volume  Final   Culture NO GROWTH 3 DAYS  Final   Report Status PENDING  Incomplete  Urine culture     Status: None   Collection Time: 06/29/17  4:33 AM  Result Value Ref Range Status   Specimen Description URINE, CATHETERIZED  Final   Special Requests NONE  Final   Culture NO GROWTH  Final   Report Status 06/30/2017 FINAL  Final  Culture, respiratory (NON-Expectorated)     Status: None   Collection Time: 06/29/17  4:53 AM  Result Value Ref Range Status   Specimen Description TRACHEAL ASPIRATE  Final   Special Requests NONE  Final   Gram Stain   Final    ABUNDANT WBC PRESENT,BOTH PMN AND MONONUCLEAR RARE YEAST    Culture FEW CANDIDA ALBICANS  Final   Report Status 07/01/2017 FINAL  Final  Respiratory Panel by PCR     Status: Abnormal   Collection Time: 06/29/17  4:53 AM  Result Value Ref Range Status   Adenovirus NOT DETECTED NOT DETECTED Final   Coronavirus 229E NOT DETECTED NOT DETECTED Final   Coronavirus HKU1 NOT DETECTED NOT DETECTED Final   Coronavirus NL63 NOT DETECTED NOT DETECTED Final   Coronavirus OC43 NOT DETECTED NOT DETECTED Final   Metapneumovirus NOT DETECTED NOT DETECTED Final   Rhinovirus / Enterovirus NOT DETECTED NOT DETECTED Final   Influenza A NOT DETECTED NOT DETECTED Final   Influenza B NOT DETECTED NOT DETECTED Final   Parainfluenza Virus 1 NOT DETECTED NOT DETECTED Final   Parainfluenza Virus 2 NOT DETECTED NOT DETECTED Final   Parainfluenza Virus 3 NOT DETECTED NOT DETECTED Final   Parainfluenza Virus 4 NOT DETECTED NOT DETECTED Final   Respiratory Syncytial Virus NOT DETECTED NOT DETECTED Final   Bordetella pertussis NOT DETECTED NOT DETECTED Final   Chlamydophila pneumoniae NOT DETECTED NOT DETECTED Final    Mycoplasma pneumoniae DETECTED (A) NOT DETECTED Final  MRSA PCR Screening     Status: None   Collection Time: 06/29/17  4:55 AM  Result Value Ref Range Status   MRSA by PCR NEGATIVE NEGATIVE Final    Comment:        The GeneXpert MRSA Assay (FDA approved for NASAL specimens only), is one component of a comprehensive MRSA colonization surveillance program. It is not intended to diagnose MRSA infection nor to guide or monitor treatment for MRSA infections.          Radiology Studies: Dg Chest Port 1 View  Result Date: 07/03/2017 CLINICAL DATA:  Cough and shortness of breath. Acute respiratory failure. EXAM: PORTABLE CHEST 1 VIEW COMPARISON:  07/02/2017 and 07/01/2017 FINDINGS: Power port in place with the tip in the right atrium, unchanged. Slight progression of diffuse bilateral pulmonary infiltrates. No discrete effusions. Heart size is within normal limits. Pulmonary vascularity is obscured by the infiltrates. IMPRESSION: Progressive diffuse bilateral pulmonary infiltrates. Power port tip is in the right atrium. Electronically Signed   By: Francene Boyers M.D.   On: 07/03/2017 07:39   Dg Chest Port 1 View  Result Date: 07/02/2017 CLINICAL DATA:  ET tube, respiratory failure EXAM: PORTABLE CHEST 1 VIEW COMPARISON:  07/01/2017 FINDINGS: Interval removal of endotracheal tube and NG tube. Left Port-A-Cath remains in place, unchanged. Cardiomegaly. Diffuse bilateral airspace disease slightly worsened since prior study. IMPRESSION: Interval extubation. Diffuse bilateral airspace disease has  slightly worsened. Electronically Signed   By: Charlett NoseKevin  Dover M.D.   On: 07/02/2017 07:15        Scheduled Meds: . amLODipine  5 mg Oral Daily  . aspirin EC  81 mg Oral Daily  . Chlorhexidine Gluconate Cloth  6 each Topical Daily  . cycloSPORINE modified  125 mg Oral BID  . furosemide  40 mg Oral Daily  . heparin  5,000 Units Subcutaneous Q8H  . mycophenolate  500 mg Oral BID  .  pantoprazole  40 mg Oral QHS  . pravastatin  40 mg Oral q1800  . predniSONE  2.5 mg Oral Q breakfast   Continuous Infusions: . sodium chloride    . sodium chloride    . azithromycin Stopped (07/02/17 1727)  . ceFEPime (MAXIPIME) IV Stopped (07/02/17 2224)     LOS: 4 days    Time spent: 40 minutes    Ramiro Harvestaniel Thompson, MD [facility] Pager [phone]  If 7PM-7AM, please contact night-coverage [email or web address] Password [email or web address] 07/03/2017, 10:28 AM

## 2017-07-03 NOTE — Telephone Encounter (Signed)
Received notice from [employer] that pt's promethazine supp was approved from 03/30/2017-06/28/2018.

## 2017-07-03 NOTE — Progress Notes (Signed)
Subjective: Interval History: has no complaint,doing better, still coughing.  Objective: Vital signs in last 24 hours: Temp:  [98.4 F (36.9 C)-99.6 F (37.6 C)] 98.5 F (36.9 C) (12/26 0830) Pulse Rate:  [36-68] 56 (12/26 0830) Resp:  [19-29] 21 (12/26 0830) BP: (136-175)/(80-98) 175/98 (12/26 0830) SpO2:  [95 %-99 %] 97 % (12/26 0830) Weight:  [69.2 kg (152 lb 8 oz)] 69.2 kg (152 lb 8 oz) (12/26 0510) Weight change: -4.226 kg (-5.1 oz)  Intake/Output from previous day: 12/25 0701 - 12/26 0700 In: 850 [P.O.:840; I.V.:10] Out: 3700 [Urine:3700] Intake/Output this shift: Total I/O In: 240 [P.O.:240] Out: 750 [Urine:750]  General appearance: alert, cooperative, no distress and pale Resp: rhonchi bilaterally Cardio: S1, S2 normal and systolic murmur: holosystolic 2/6, blowing at apex GI: obese, pos bs, liver down 4 cm, TX LLQ Extremities: edema 1+  Lab Results: Recent Labs    07/02/17 0534 07/03/17 0922  WBC 7.3 5.6  HGB 8.0* 9.0*  HCT 25.6* 27.0*  PLT 217 222   BMET:  Recent Labs    07/02/17 1531 07/03/17 0922  NA 141 141  K 3.5 2.9*  CL 100* 101  CO2 28 28  GLUCOSE 93 136*  BUN 37* 25*  CREATININE 1.61* 1.16*  CALCIUM 8.7* 8.7*   No results for input(s): PTH in the last 72 hours. Iron Studies:  Recent Labs    07/02/17 0821  IRON 17*  TIBC 176*    Studies/Results: Dg Chest Port 1 View  Result Date: 07/03/2017 CLINICAL DATA:  Cough and shortness of breath. Acute respiratory failure. EXAM: PORTABLE CHEST 1 VIEW COMPARISON:  07/02/2017 and 07/01/2017 FINDINGS: Power port in place with the tip in the right atrium, unchanged. Slight progression of diffuse bilateral pulmonary infiltrates. No discrete effusions. Heart size is within normal limits. Pulmonary vascularity is obscured by the infiltrates. IMPRESSION: Progressive diffuse bilateral pulmonary infiltrates. Power port tip is in the right atrium. Electronically Signed   By: Francene BoyersJames  Maxwell M.D.   On:  07/03/2017 07:39   Dg Chest Port 1 View  Result Date: 07/02/2017 CLINICAL DATA:  ET tube, respiratory failure EXAM: PORTABLE CHEST 1 VIEW COMPARISON:  07/01/2017 FINDINGS: Interval removal of endotracheal tube and NG tube. Left Port-A-Cath remains in place, unchanged. Cardiomegaly. Diffuse bilateral airspace disease slightly worsened since prior study. IMPRESSION: Interval extubation. Diffuse bilateral airspace disease has slightly worsened. Electronically Signed   By: Charlett NoseKevin  Dover M.D.   On: 07/02/2017 07:15    I have reviewed the patient's current medications.  Assessment/Plan: 1 Renal TX improved, ? Mild-mod vol xs 2 HTN ^ Norvasc and lasix 3 Neurogenic bladder SIC 4 Obesity 5 Pneu still serious issue, many secretions and very abn CXR on 2 AB 6 Anemia stable, Fe given 7 RP fibrosis  P  ^ lasix, ^amlod, follow closely   LOS: 4 days   Skylinn Vialpando 07/03/2017,10:46 AM

## 2017-07-04 ENCOUNTER — Inpatient Hospital Stay (HOSPITAL_COMMUNITY): Payer: Medicare Other

## 2017-07-04 DIAGNOSIS — R197 Diarrhea, unspecified: Secondary | ICD-10-CM | POA: Diagnosis not present

## 2017-07-04 LAB — CULTURE, BLOOD (ROUTINE X 2)
CULTURE: NO GROWTH
Culture: NO GROWTH
SPECIAL REQUESTS: ADEQUATE
Special Requests: ADEQUATE

## 2017-07-04 LAB — RENAL FUNCTION PANEL
ALBUMIN: 2.6 g/dL — AB (ref 3.5–5.0)
Anion gap: 12 (ref 5–15)
BUN: 21 mg/dL — AB (ref 6–20)
CO2: 26 mmol/L (ref 22–32)
Calcium: 8.8 mg/dL — ABNORMAL LOW (ref 8.9–10.3)
Chloride: 102 mmol/L (ref 101–111)
Creatinine, Ser: 1.29 mg/dL — ABNORMAL HIGH (ref 0.44–1.00)
GFR calc Af Amer: >60 mL/min (ref >60)
GFR calc non Af Amer: 54 mL/min — ABNORMAL LOW (ref >60)
GLUCOSE: 114 mg/dL — AB (ref 65–99)
PHOSPHORUS: 3 mg/dL (ref 2.5–4.6)
POTASSIUM: 3.4 mmol/L — AB (ref 3.5–5.1)
SODIUM: 140 mmol/L (ref 135–145)

## 2017-07-04 LAB — GLUCOSE, CAPILLARY
GLUCOSE-CAPILLARY: 92 mg/dL (ref 65–99)
Glucose-Capillary: 93 mg/dL (ref 65–99)
Glucose-Capillary: 84 mg/dL (ref 65–99)
Glucose-Capillary: 109 mg/dL — ABNORMAL HIGH (ref 65–99)

## 2017-07-04 LAB — MAGNESIUM: Magnesium: 1.5 mg/dL — ABNORMAL LOW (ref 1.7–2.4)

## 2017-07-04 LAB — CBC
HEMATOCRIT: 27.9 % — AB (ref 36.0–46.0)
Hemoglobin: 9.8 g/dL — ABNORMAL LOW (ref 12.0–15.0)
MCH: 33 pg (ref 26.0–34.0)
MCHC: 35.1 g/dL (ref 30.0–36.0)
MCV: 93.9 fL (ref 78.0–100.0)
Platelets: 253 10*3/uL (ref 150–400)
RBC: 2.97 MIL/uL — ABNORMAL LOW (ref 3.87–5.11)
RDW: 14.8 % (ref 11.5–15.5)
WBC: 6.3 10*3/uL (ref 4.0–10.5)

## 2017-07-04 LAB — C DIFFICILE QUICK SCREEN W PCR REFLEX
C DIFFICILE (CDIFF) INTERP: NOT DETECTED
C Diff antigen: NEGATIVE
C Diff toxin: NEGATIVE

## 2017-07-04 MED ORDER — LOPERAMIDE HCL 2 MG PO CAPS
4.0000 mg | ORAL_CAPSULE | Freq: Once | ORAL | Status: AC
  Administered 2017-07-04: 4 mg via ORAL
  Filled 2017-07-04: qty 2

## 2017-07-04 MED ORDER — POTASSIUM CHLORIDE CRYS ER 20 MEQ PO TBCR
40.0000 meq | EXTENDED_RELEASE_TABLET | Freq: Two times a day (BID) | ORAL | Status: AC
  Administered 2017-07-04 (×2): 40 meq via ORAL
  Filled 2017-07-04 (×2): qty 2

## 2017-07-04 MED ORDER — MAGNESIUM SULFATE 2 GM/50ML IV SOLN
2.0000 g | Freq: Once | INTRAVENOUS | Status: AC
  Administered 2017-07-04: 2 g via INTRAVENOUS
  Filled 2017-07-04: qty 50

## 2017-07-04 MED ORDER — HYDRALAZINE HCL 20 MG/ML IJ SOLN
10.0000 mg | Freq: Once | INTRAMUSCULAR | Status: AC
  Administered 2017-07-04: 10 mg via INTRAVENOUS

## 2017-07-04 MED ORDER — LOPERAMIDE HCL 2 MG PO CAPS: 2.0000 mg | ORAL_CAPSULE | ORAL | Status: DC | PRN

## 2017-07-04 MED ORDER — IPRATROPIUM-ALBUTEROL 0.5-2.5 (3) MG/3ML IN SOLN
3.0000 mL | Freq: Two times a day (BID) | RESPIRATORY_TRACT | Status: DC
  Administered 2017-07-04 – 2017-07-05 (×2): 3 mL via RESPIRATORY_TRACT
  Filled 2017-07-04 (×3): qty 3

## 2017-07-04 NOTE — Progress Notes (Signed)
PROGRESS NOTE    Donna Shannon  ZOX:[id number] DOB: 12-03-1985 DOA: 06/29/2017 PCP: Patient, No Pcp Per    Brief Narrative:  31 year old female with PMH of Factor V Leiden, Kidney disease s/p renal transplant x 2, retroperitoneal fibrosis, requires urinary self catheterization, and CVA  7/23 underwent left salpingoopherectomy, admission 8/17-8/18 for ARDS. Transferred to [facility]   12/21 patient presents to [facility] ED with ongoing cough and dyspnea. Worked up for Right lower lobe PNA and sepsis, given IV Levaquin. Father transferred patient to [facility] for further workup. Upon arrival to ED oxygen saturation 57% on room air. Intubated. Administered Cefepime and Vancomycin. CT Chest with diffuse bilateral airspace opacities concerning ARDS. PCCM asked to admit.   Patient admitted by PCCM intubated placed empirically on IV antibiotics and pancultured.  As patient improved she was subsequently extubated and transferred to the stepdown unit and care transferred to [facility].  Nephrology following for acute on chronic kidney disease.     Assessment & Plan:   Principal Problem:   Acute respiratory failure with hypoxia and hypercapnia (HCC) Active Problems:   ARDS (adult respiratory distress syndrome) (HCC)   Mycoplasma pneumoniae pneumonia   Obesity   Kidney transplant status, living related donor   Neurogenic bladder   Hypertensive disorder   Stage 3 chronic kidney disease (HCC)   OSA (obstructive sleep apnea)   Retroperitoneal fibrosis   Vitamin D deficiency   GERD (gastroesophageal reflux disease)   Long-term use of immunosuppressant medication   Acute respiratory failure with hypoxia (HCC)   Septic shock (HCC)   Acute metabolic encephalopathy   Acute kidney injury superimposed on CKD (HCC)   Respiratory acidosis   Diarrhea  #1 acute respiratory failure with hypoxia and hypercapnia with respiratory acidosis concerning for ARDS/mycoplasma pneumonia  in the immunocompromised patient with history of severe obstructive sleep apnea Patient was admitted placed on the critical care service.  Patient pancultured and intubated on admission.  Blood cultures and urine cultures negative to date.  Sputum cultures positive for Candida.  Respiratory viral panel positive for mycoplasma pneumonia.  Urine Legionella and urine pneumococcus antigens were negative.  MRSA PCR negative.  Patient initially placed empirically on IV vancomycin, cefepime and azithromycin.  Vancomycin subsequently discontinued 07/01/2017 and patient currently on IV azithromycin and cefepime.  Patient extubated 07/01/2017 and currently on nasal cannula.  Chest x-ray unchanged with diffuse bilateral infiltrates still concerning for ARDS.  Patient less rhonchorous than she was on 07/03/2017 however still with diffuse coarse breath sounds. She is noted to have a urine output of 3.3 L over the past 24 hours.  Continue empiric IV azithromycin and IV cefepime.  Continue oral prednisone 2.5 mg daily.  Lasix dose was increased to 40 mg twice daily per nephrology.  Continue Mucinex, scheduled nebulizer treatments, Pulmicort, chest PT.  If continued improvement and remains afebrile could likely transition to oral antibiotics tomorrow.   2.  Septic shock Secondary to problem #1.  Not on pressors.  Blood pressure improved on current regimen. Lopressor was held due to low heart rates.  Blood cultures with no growth to date.  Urine Legionella and urine pneumococcus antigen were negative.  Respiratory viral panel positive for mycoplasma pneumonia.  Sputum cultures positive for Candida.  Continue empiric IV antibiotics, fluconazole.  Patient noted to be immunocompromised.  3.  Hypertension Blood pressure improved on current regimen of Lasix, Norvasc 10 mg daily.  Elevated this morning.  The patient's home dose Lopressor held due  to bouts of bradycardia.  4.  Acute metabolic encephalopathy Likely secondary to  problem #1.  Improved.  See problem #1.  5.  History of hyperlipidemia Continue statin.  6.  Factor V Leiden Outpatient follow-up.  7.  Acute renal failure on chronic kidney disease stage III/status post kidney transplant.  Baseline creatinine 1.6-1.8. Likely secondary to a prerenal azotemia as patient noted to have presented in septic shock.  Improving.  Patient with a urine output of 3.3 L over the past 24 hours.  Creatinine currently at 1.29.  Continue current dose of prednisone, cyclosporine, CellCept.  Continue home dose Lasix.  Nephrology following.  Follow.  8.  Hypophosphatemia/hypomagnesemia/hypokalemia Phosphorus levels repleted.  Magnesium at 1.5.  We will give a dose of IV magnesium 2 g x1.  Potassium of 3.4 which has been repleted per nephrology.   9.  Iron deficiency anemia H&H stable. s/p IV Feraheme.  Nephrology following.  10.  Depression Stable.  Continue home regimen Zoloft.  11.  Neurogenic bladder Discontinue Foley catheter. I and O cath every 6 hours as needed.  Continue myrbetriq.  12.  Obesity Patient follows up in the outpatient setting at the weight loss clinic.  13.  Gastroesophageal reflux disease.   PPI.  14 diarrhea Patient complained of multiple episodes of loose stools since last night and this morning.  Patient on empiric IV antibiotics.  Patient not on laxatives.  Check a C. difficile PCR.  If C. difficile PCR is negative will place on Imodium as needed.    DVT prophylaxis: Heparin Code Status: Full Family Communication: Updated patient and father at bedside. Disposition Plan: Remain in stepdown unit.   Consultants:   Admitted to [facility] 06/29/2017  Nephrology: Dr. Allena Katz 06/29/2017  Procedures:  CT Chest 12/22 > Diffuse bilateral airspace opacification, more dense at the lower lung lobes, concerning for ARDS. Underlying pneumonia or flash pulmonary edema cannot be excluded Chest x-ray 06/29/2017, 06/30/2017, 07/01/2017, 07/02/2017,  07/03/2017 2D echo 06/29/2017  CULTURES: Blood 12/22 >> negative U/A 12/22 >> negative Sputum 12/22 >> candida RVP 12/22 > mycoplasma pneumonia Urine legionella, pneumococcus 12/22 > negative  LINES/TUBES: ETT 12/22 >>  12/24   Antimicrobials:  Cefepime 12/22 >> Vancomycin 12/22 >> 12/24 Azithromycin 12/22>>      Subjective: Patient on nasal cannula sitting up in chair.  Patient states shortness of breath improving daily and slowly, however not at baseline.  Patient denies any chest pain.  No abdominal pain.  Patient complaining of multiple episodes of diarrhea overnight and this morning.  Patient states had to use the restroom over 17 times last night.  Tolerating current oral intake.  Patient asking for Foley catheter to be removed.  Objective: Vitals:   07/04/17 0259 07/04/17 0358 07/04/17 0553 07/04/17 0754  BP: (!) 160/103 (!) 136/103 (!) 128/92 123/88  Pulse: (!) 57 (!) 55 (!) 58 (!) 121  Resp: (!) 22 16 18  (!) 25  Temp: 98.7 F (37.1 C)   98.2 F (36.8 C)  TempSrc: Oral   Oral  SpO2: 100% 98% 98% 100%  Weight: 67 kg (147 lb 12.8 oz)     Height:        Intake/Output Summary (Last 24 hours) at 07/04/2017 1111 Last data filed at 07/04/2017 0719 Gross per 24 hour  Intake 600 ml  Output 3250 ml  Net -2650 ml   Filed Weights   07/02/17 0500 07/03/17 0510 07/04/17 0259  Weight: 73.4 kg (161 lb 13.1 oz) 69.2 kg (152 lb 8  oz) 67 kg (147 lb 12.8 oz)    Examination:  General exam: Appears calm and comfortable.  Sitting up in chair. Respiratory system: Less coarse, less rhonchorous breath sounds.  No wheezing.  No crackles.  On nasal cannula.  Cardiovascular system: Regular rate and rhythm no murmurs, rubs, gallops.  No lower extremity edema. Gastrointestinal system: Abdomen is soft, nontender, nondistended, positive bowel sounds.  No hepatosplenomegaly.  Central nervous system: Alert and oriented. No focal neurological deficits. Extremities: Symmetric 5 x 5  power. Skin: No rashes, lesions or ulcers Psychiatry: Judgement and insight appear fair. Mood & affect appropriate.     Data Reviewed: I have personally reviewed following labs and imaging studies  CBC: Recent Labs  Lab 06/29/17 0047  06/30/17 0720 06/30/17 1848 07/01/17 0316 07/02/17 0534 07/03/17 0922 07/04/17 0847  WBC 20.8*   < > 9.0  --  9.8 7.3 5.6 6.3  NEUTROABS 18.3*  --   --   --   --  5.3  --   --   HGB 10.1*   < > 6.8* 8.3* 8.4* 8.0* 9.0* 9.8*  HCT 33.9*   < > 21.6* 25.6* 26.9* 25.6* 27.0* 27.9*  MCV 94.7   < > 90.0  --  92.8 94.5 96.4 93.9  PLT 279   < > 182  --  201 217 222 253   < > = values in this interval not displayed.   Basic Metabolic Panel: Recent Labs  Lab 06/30/17 0720 06/30/17 0758  07/01/17 0316 07/02/17 0533 07/02/17 1531 07/03/17 0922 07/04/17 0847  NA  --  140   < > 141 145 141 141 140  K  --  2.4*   < > 3.6 2.7* 3.5 2.9* 3.4*  CL  --  98*   < > 99* 103 100* 101 102  CO2  --  32   < > 30 30 28 28 26   GLUCOSE  --  145*   < > 95 71 93 136* 114*  BUN  --  35*   < > 36* 37* 37* 25* 21*  CREATININE  --  2.28*   < > 2.20* 1.86* 1.61* 1.16* 1.29*  CALCIUM  --  7.0*   < > 8.0* 8.4* 8.7* 8.7* 8.8*  MG 2.0  --   --  2.1 2.1  --  1.6* 1.5*  PHOS  --  3.3  --  6.2* 3.0  --  1.4* 3.0   < > = values in this interval not displayed.   GFR: Estimated Creatinine Clearance: 55.7 mL/min (A) (by C-G formula based on SCr of 1.29 mg/dL (H)). Liver Function Tests: Recent Labs  Lab 06/29/17 0047 06/30/17 0758 07/01/17 0316 07/02/17 0533 07/03/17 0922 07/04/17 0847  AST 54*  --   --   --   --   --   ALT 21  --   --   --   --   --   ALKPHOS 42  --   --   --   --   --   BILITOT 0.9  --   --   --   --   --   PROT 6.4*  --   --   --   --   --   ALBUMIN 2.8* 1.8* 2.0* 2.0* 2.3* 2.6*   No results for input(s): LIPASE, AMYLASE in the last 168 hours. No results for input(s): AMMONIA in the last 168 hours. Coagulation Profile: Recent Labs  Lab  06/29/17 [phone]  INR 1.33   Cardiac Enzymes: Recent Labs  Lab 06/29/17 0247 06/29/17 0607 06/29/17 1651  TROPONINI <0.03 <0.03 <0.03   BNP (last 3 results) No results for input(s): PROBNP in the last 8760 hours. HbA1C: No results for input(s): HGBA1C in the last 72 hours. CBG: Recent Labs  Lab 07/03/17 0600 07/03/17 1130 07/03/17 1630 07/03/17 2111 07/04/17 0636  GLUCAP 77 82 94 109* 93   Lipid Profile: No results for input(s): CHOL, HDL, LDLCALC, TRIG, CHOLHDL, LDLDIRECT in the last 72 hours. Thyroid Function Tests: No results for input(s): TSH, T4TOTAL, FREET4, T3FREE, THYROIDAB in the last 72 hours. Anemia Panel: Recent Labs    07/02/17 0821  TIBC 176*  IRON 17*   Sepsis Labs: Recent Labs  Lab 06/29/17 0057 06/29/17 0247 06/29/17 0609 07/01/17 0316  PROCALCITON  --  3.23  --  3.85  LATICACIDVEN 0.80 0.9 1.0  --     Recent Results (from the past 240 hour(s))  Blood Culture (routine x 2)     Status: None (Preliminary result)   Collection Time: 06/29/17 12:44 AM  Result Value Ref Range Status   Specimen Description BLOOD RIGHT FOREARM  Final   Special Requests   Final    BOTTLES DRAWN AEROBIC AND ANAEROBIC Blood Culture adequate volume   Culture NO GROWTH 4 DAYS  Final   Report Status PENDING  Incomplete  Blood Culture (routine x 2)     Status: None (Preliminary result)   Collection Time: 06/29/17  1:03 AM  Result Value Ref Range Status   Specimen Description BLOOD RIGHT HAND  Final   Special Requests IN PEDIATRIC BOTTLE Blood Culture adequate volume  Final   Culture NO GROWTH 4 DAYS  Final   Report Status PENDING  Incomplete  Urine culture     Status: None   Collection Time: 06/29/17  4:33 AM  Result Value Ref Range Status   Specimen Description URINE, CATHETERIZED  Final   Special Requests NONE  Final   Culture NO GROWTH  Final   Report Status 06/30/2017 FINAL  Final  Culture, respiratory (NON-Expectorated)     Status: None   Collection Time:  06/29/17  4:53 AM  Result Value Ref Range Status   Specimen Description TRACHEAL ASPIRATE  Final   Special Requests NONE  Final   Gram Stain   Final    ABUNDANT WBC PRESENT,BOTH PMN AND MONONUCLEAR RARE YEAST    Culture FEW CANDIDA ALBICANS  Final   Report Status 07/01/2017 FINAL  Final  Respiratory Panel by PCR     Status: Abnormal   Collection Time: 06/29/17  4:53 AM  Result Value Ref Range Status   Adenovirus NOT DETECTED NOT DETECTED Final   Coronavirus 229E NOT DETECTED NOT DETECTED Final   Coronavirus HKU1 NOT DETECTED NOT DETECTED Final   Coronavirus NL63 NOT DETECTED NOT DETECTED Final   Coronavirus OC43 NOT DETECTED NOT DETECTED Final   Metapneumovirus NOT DETECTED NOT DETECTED Final   Rhinovirus / Enterovirus NOT DETECTED NOT DETECTED Final   Influenza A NOT DETECTED NOT DETECTED Final   Influenza B NOT DETECTED NOT DETECTED Final   Parainfluenza Virus 1 NOT DETECTED NOT DETECTED Final   Parainfluenza Virus 2 NOT DETECTED NOT DETECTED Final   Parainfluenza Virus 3 NOT DETECTED NOT DETECTED Final   Parainfluenza Virus 4 NOT DETECTED NOT DETECTED Final   Respiratory Syncytial Virus NOT DETECTED NOT DETECTED Final   Bordetella pertussis NOT DETECTED NOT DETECTED Final   Chlamydophila pneumoniae NOT DETECTED  NOT DETECTED Final   Mycoplasma pneumoniae DETECTED (A) NOT DETECTED Final  MRSA PCR Screening     Status: None   Collection Time: 06/29/17  4:55 AM  Result Value Ref Range Status   MRSA by PCR NEGATIVE NEGATIVE Final    Comment:        The GeneXpert MRSA Assay (FDA approved for NASAL specimens only), is one component of a comprehensive MRSA colonization surveillance program. It is not intended to diagnose MRSA infection nor to guide or monitor treatment for MRSA infections.          Radiology Studies: Dg Chest Port 1 View  Result Date: 07/04/2017 CLINICAL DATA:  ARDS EXAM: PORTABLE CHEST 1 VIEW COMPARISON:  07/03/2017 FINDINGS: Cardiac shadow is  stable. Right-sided chest wall port is again seen and stable the degree of central vascular congestion and infiltrative changes has nearly completely resolved in the interval from the prior exam. No sizable effusion is noted. No acute bony abnormality is seen. Postsurgical changes in the abdomen are noted. IMPRESSION: Near complete resolution of previously seen vascular congestion and infiltrative changes. Electronically Signed   By: Alcide CleverMark  Lukens M.D.   On: 07/04/2017 08:09   Dg Chest Port 1 View  Result Date: 07/03/2017 CLINICAL DATA:  Cough and shortness of breath. Acute respiratory failure. EXAM: PORTABLE CHEST 1 VIEW COMPARISON:  07/02/2017 and 07/01/2017 FINDINGS: Power port in place with the tip in the right atrium, unchanged. Slight progression of diffuse bilateral pulmonary infiltrates. No discrete effusions. Heart size is within normal limits. Pulmonary vascularity is obscured by the infiltrates. IMPRESSION: Progressive diffuse bilateral pulmonary infiltrates. Power port tip is in the right atrium. Electronically Signed   By: Francene BoyersJames  Maxwell M.D.   On: 07/03/2017 07:39        Scheduled Meds: . amLODipine  10 mg Oral QHS  . aspirin EC  81 mg Oral Daily  . budesonide (PULMICORT) nebulizer solution  0.5 mg Nebulization BID  . Chlorhexidine Gluconate Cloth  6 each Topical Daily  . cycloSPORINE modified  125 mg Oral BID  . fluconazole  100 mg Oral Daily  . furosemide  40 mg Oral BID  . guaiFENesin  1,200 mg Oral BID  . heparin  5,000 Units Subcutaneous Q8H  . ipratropium-albuterol  3 mL Nebulization BID  . mirabegron ER  25 mg Oral Daily  . mycophenolate  500 mg Oral BID  . pantoprazole  40 mg Oral QHS  . potassium chloride  40 mEq Oral Once  . potassium chloride  40 mEq Oral BID  . pravastatin  40 mg Oral q1800  . predniSONE  2.5 mg Oral Q breakfast   Continuous Infusions: . sodium chloride 250 mL (07/04/17 0035)  . sodium chloride    . azithromycin Stopped (07/03/17 1720)  .  ceFEPime (MAXIPIME) IV Stopped (07/03/17 2230)     LOS: 5 days    Time spent: 40 minutes    Ramiro Harvestaniel Aaminah Forrester, MD [facility] Pager [phone]  If 7PM-7AM, please contact night-coverage [email or web address] Password [email or web address] 07/04/2017, 11:11 AM

## 2017-07-04 NOTE — Progress Notes (Signed)
Subjective: Interval History: has complaints , D[facility]  Objective: Vital signs in last 24 hours: Temp:  [98.2 F (36.8 C)-99.2 F (37.3 C)] 98.2 F (36.8 C) (12/27 0754) Pulse Rate:  [55-121] 121 (12/27 0754) Resp:  [16-25] 25 (12/27 0754) BP: (123-164)/(88-107) 123/88 (12/27 0754) SpO2:  [96 %-100 %] 100 % (12/27 0754) Weight:  [67 kg (147 lb 12.8 oz)] 67 kg (147 lb 12.8 oz) (12/27 0259) Weight change: -2.132 kg (-11.2 oz)  Intake/Output from previous day: 12/26 0701 - 12/27 0700 In: 840 [P.O.:240; IV Piggyback:600] Out: 3300 [Urine:3300] Intake/Output this shift: Total I/O In: -  Out: 700 [Urine:700]  General appearance: alert, cooperative, no distress, moderately obese and pale Resp: rhonchi bilaterally Cardio: S1, S2 normal and systolic murmur: holosystolic 2/6, blowing at apex GI: obese, pos bs, soft, TX LLQ Extremities: edema tr  Lab Results: Recent Labs    07/03/17 0922 07/04/17 0847  WBC 5.6 6.3  HGB 9.0* 9.8*  HCT 27.0* 27.9*  PLT 222 253   BMET:  Recent Labs    07/03/17 0922 07/04/17 0847  NA 141 140  K 2.9* 3.4*  CL 101 102  CO2 28 26  GLUCOSE 136* 114*  BUN 25* 21*  CREATININE 1.16* 1.29*  CALCIUM 8.7* 8.8*   No results for input(s): PTH in the last 72 hours. Iron Studies:  Recent Labs    07/02/17 0821  IRON 17*  TIBC 176*    Studies/Results: Dg Chest Port 1 View  Result Date: 07/04/2017 CLINICAL DATA:  ARDS EXAM: PORTABLE CHEST 1 VIEW COMPARISON:  07/03/2017 FINDINGS: Cardiac shadow is stable. Right-sided chest wall port is again seen and stable the degree of central vascular congestion and infiltrative changes has nearly completely resolved in the interval from the prior exam. No sizable effusion is noted. No acute bony abnormality is seen. Postsurgical changes in the abdomen are noted. IMPRESSION: Near complete resolution of previously seen vascular congestion and infiltrative changes.  Electronically Signed   By: Alcide CleverMark  Lukens M.D.   On: 07/04/2017 08:09   Dg Chest Port 1 View  Result Date: 07/03/2017 CLINICAL DATA:  Cough and shortness of breath. Acute respiratory failure. EXAM: PORTABLE CHEST 1 VIEW COMPARISON:  07/02/2017 and 07/01/2017 FINDINGS: Power port in place with the tip in the right atrium, unchanged. Slight progression of diffuse bilateral pulmonary infiltrates. No discrete effusions. Heart size is within normal limits. Pulmonary vascularity is obscured by the infiltrates. IMPRESSION: Progressive diffuse bilateral pulmonary infiltrates. Power port tip is in the right atrium. Electronically Signed   By: Francene BoyersJames  Maxwell M.D.   On: 07/03/2017 07:39    I have reviewed the patient's current medications.  Assessment/Plan: 1 Renal TX , AKI resolved.  Will go to home [facility]  2 HTn improved with Lasix, amlod 3 neurogenic bladder [facility] 4 D discussed with Dr. Janee Mornhompson, eval , not typ for C diff but at risk 5 Obesity 6RPF 7 OSA P cont bp meds, immunosuppress, [facility], w/u D   LOS: 5 days   Fayrene FearingJames Laurren Lepkowski 07/04/2017,10:16 AM

## 2017-07-04 NOTE — Progress Notes (Signed)
RT came to do neb tx. Staff notified pt needs assistance to the bathroom. Neb tx on HOLD due to pt unavailable at this time

## 2017-07-04 NOTE — Care Management Note (Signed)
Case Management Note Donn PieriniKristi Aaliyah Cancro RN, BSN Unit 4E-Case Manager [phone]  Patient Details  Name: Donna EvenerStephanie Shiner MRN: [id number] Date of Birth: 04/06/1986  Subjective/Objective:  Pt admitted with acute resp. Failure -required intubation- RLL PNA- now extubated- continues on IV abx for now                  Action/Plan: PTA pt lived at home- good family support by parents- [place] in [place]- CM to follow   Expected Discharge Date:                  Expected Discharge Plan:  Home/Self Care  In-House Referral:     Discharge planning Services  CM Consult  Post Acute Care Choice:    Choice offered to:     DME Arranged:    DME Agency:     HH Arranged:    HH Agency:     Status of Service:  In process, will continue to follow  If discussed at Long Length of Stay Meetings, dates discussed:    Discharge Disposition:   Additional Comments:  Darrold SpanWebster, Sharalyn Lomba Hall, RN 07/04/2017, 11:31 AM

## 2017-07-04 NOTE — Progress Notes (Signed)
Patient ambulated 320 feet in hallway with standby assistance. On 4L o2 sat 98-100%, 2L sat 96-100%. On room air sat 92-95%. Tolerated walk well.

## 2017-07-05 LAB — CBC
HCT: 27.4 % — ABNORMAL LOW (ref 36.0–46.0)
Hemoglobin: 9.5 g/dL — ABNORMAL LOW (ref 12.0–15.0)
MCH: 33.3 pg (ref 26.0–34.0)
MCHC: 34.7 g/dL (ref 30.0–36.0)
MCV: 96.1 fL (ref 78.0–100.0)
PLATELETS: 259 10*3/uL (ref 150–400)
RBC: 2.85 MIL/uL — AB (ref 3.87–5.11)
RDW: 16.1 % — ABNORMAL HIGH (ref 11.5–15.5)
WBC: 6.3 10*3/uL (ref 4.0–10.5)

## 2017-07-05 LAB — RENAL FUNCTION PANEL
Albumin: 2.6 g/dL — ABNORMAL LOW (ref 3.5–5.0)
Anion gap: 12 (ref 5–15)
BUN: 24 mg/dL — ABNORMAL HIGH (ref 6–20)
CALCIUM: 8.7 mg/dL — AB (ref 8.9–10.3)
CHLORIDE: 103 mmol/L (ref 101–111)
CO2: 25 mmol/L (ref 22–32)
CREATININE: 1.39 mg/dL — AB (ref 0.44–1.00)
GFR, EST AFRICAN AMERICAN: 57 mL/min — AB (ref >60)
GFR, EST NON AFRICAN AMERICAN: 49 mL/min — AB (ref >60)
Glucose, Bld: 79 mg/dL (ref 65–99)
PHOSPHORUS: 3.9 mg/dL (ref 2.5–4.6)
Potassium: 4.5 mmol/L (ref 3.5–5.1)
Sodium: 140 mmol/L (ref 135–145)

## 2017-07-05 LAB — GLUCOSE, CAPILLARY
GLUCOSE-CAPILLARY: 82 mg/dL (ref 65–99)
GLUCOSE-CAPILLARY: 104 mg/dL — AB (ref 65–99)
Glucose-Capillary: 80 mg/dL (ref 65–99)

## 2017-07-05 LAB — MAGNESIUM: MAGNESIUM: 1.8 mg/dL (ref 1.7–2.4)

## 2017-07-05 MED ORDER — MIRABEGRON ER 25 MG PO TB24: 25.0000 mg | ORAL_TABLET | Freq: Every day | ORAL | 0 refills | Status: DC

## 2017-07-05 MED ORDER — POTASSIUM CHLORIDE CRYS ER 10 MEQ PO TBCR: 10.0000 meq | EXTENDED_RELEASE_TABLET | Freq: Every day | ORAL | 0 refills | Status: AC

## 2017-07-05 MED ORDER — FUROSEMIDE 40 MG PO TABS: 40.0000 mg | ORAL_TABLET | Freq: Two times a day (BID) | ORAL | 0 refills | Status: DC

## 2017-07-05 MED ORDER — AMLODIPINE BESYLATE 10 MG PO TABS: 10.0000 mg | ORAL_TABLET | Freq: Every day | ORAL | 0 refills | Status: DC

## 2017-07-05 MED ORDER — CEPHALEXIN 500 MG PO CAPS: 500.0000 mg | ORAL_CAPSULE | Freq: Two times a day (BID) | ORAL | 0 refills | Status: AC

## 2017-07-05 MED ORDER — POTASSIUM CHLORIDE CRYS ER 10 MEQ PO TBCR
10.0000 meq | EXTENDED_RELEASE_TABLET | Freq: Once | ORAL | 0 refills | Status: DC
Start: 2017-07-05 — End: 2017-07-05

## 2017-07-05 MED ORDER — AZITHROMYCIN 500 MG PO TABS: 500.0000 mg | ORAL_TABLET | Freq: Every day | ORAL | 0 refills | Status: AC

## 2017-07-05 MED ORDER — HEPARIN SOD (PORK) LOCK FLUSH 100 UNIT/ML IV SOLN
500.0000 [IU] | INTRAVENOUS | Status: AC | PRN
  Administered 2017-07-05: 500 [IU]

## 2017-07-05 NOTE — Progress Notes (Signed)
Pharmacy Antibiotic Note  Donna Shannon is a 31 y.o. female on day # 7 Cefepime for HCAP coverage and Azithromycin for Mycoplasma pneumonia.  Pharmacy has been consulted for Cefepime dosing.  Hx kidney transplant x 2, on immunosuppressants.  Creatinine trended down then up slightly over the last few days, but Cefepime dose remains appropriate.  Plan:  Continue Cefepime 1gm IV q24hrs.  Also on Azithromycin 500 mg IV q24hrs.  Will follow renal function, follow up for antibiotic plans.  Height: 5\' 2"  (157.5 cm) Weight: 144 lb 12.8 oz (65.7 kg) IBW/kg (Calculated) : 50.1  Temp (24hrs), Avg:98.6 F (37 C), Min:98 F (36.7 C), Max:98.9 F (37.2 C)  Recent Labs  Lab 06/29/17 0057 06/29/17 0247  06/29/17 0609  07/01/17 0316 07/02/17 0533 07/02/17 0534 07/02/17 1531 07/03/17 0922 07/04/17 0847 07/05/17 0557  WBC  --   --    < >  --    < > 9.8  --  7.3  --  5.6 6.3 6.3  CREATININE  --   --    < >  --    < > 2.20* 1.86*  --  1.61* 1.16* 1.29* 1.39*  LATICACIDVEN 0.80 0.9  --  1.0  --   --   --   --   --   --   --   --    < > = values in this interval not displayed.    Estimated Creatinine Clearance: 51.2 mL/min (A) (by C-G formula based on SCr of 1.39 mg/dL (H)).    Allergies  Allergen Reactions  . Phenazopyridine Other (See Comments)    Caused liver functions to go down and jaundice   . Vancomycin Itching  . Versed [Midazolam] Itching  . Sulfa Antibiotics Itching    Antimicrobials this admission: Vancomycin 12/22>>12/24 Cefepime 12/22>> Azithromycin 12/22>> Fluconazole 12/26>>12/28  Dose adjustments this admission:  n/a  Microbiology results: 12/22 Blood - negative 12/22 MRSA PCR - negative 12/22 TA - few Candida albicans 12/22 Resp panel - +Mycoplasma pneumoniae 12/22 Urine - negative 12/24 Strep pneumo Ag - negative 12/24 Legionella Ag - negatice 12/27 C diff PCR - negative  Thank you for allowing pharmacy to be a part of this patient's care.  Dennie Fettersgan, Syra Sirmons  [place], [place] Pager: [phone] 07/05/2017 3:30 PM

## 2017-07-05 NOTE — Progress Notes (Signed)
Nicholaus BloomKelley RN contacted prior to reaccess of port. RN requested that reaccess be held due to patient potential discharge on today.

## 2017-07-05 NOTE — Progress Notes (Signed)
[facility] Progress Note    Assessment/ Plan:   1 Renal TX , AKI resolved.  On home immunosuppression regimen.  Last cyclosporine level 43 before really beginning azithro (can potentiate levels).  Arranging labs through our office for Monday 12/31.  D/W Dr Kathrene [place]ongo[place] 2 HTN: improved.  On amlodipine 10 mg daily and Lasix 40 mg BID 3 neurogenic bladder- [facility] 4 Diarrhea: C diff negative.  Imodium improved diarrhea.  5.  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  Had been on fluconazole (received 2 doses), no clear indication.  This has been stopped.  6.  Dispo: OK to go from renal perspective with labs Monday   Subjective:    For d/c today.  She was placed on fluconazole with no clear indication yesterday which is a potent inducer of CNI levels.  This has been stopped.   Objective:   BP (!) 131/93 (BP Location: Left Arm)   Pulse 67   Temp 98.5 F (36.9 C) (Axillary)   Resp (!) 28   Ht 5\' 2"  (1.575 m)   Wt 65.7 kg (144 lb 12.8 oz)   LMP 07/04/2017   SpO2 96%   BMI 26.48 kg/m   Intake/Output Summary (Last 24 hours) at 07/05/2017 1156 Last data filed at 07/05/2017 0600 Gross per 24 hour  Intake 480 ml  Output 1650 ml  Net -1170 ml   Weight change: -1.361 kg ()  Physical Exam: Gen: younger woman, sitting in bed< NAD CHEST: R port accessed CVS: RRR no m/r/g Resp: faint expiratory wheezes throughout Abd: obese, allograft nontender Ext: trace LE edema  Imaging: Dg Chest Port 1 View  Result Date: 07/04/2017 CLINICAL DATA:  ARDS EXAM: PORTABLE CHEST 1 VIEW COMPARISON:  07/03/2017 FINDINGS: Cardiac shadow is stable. Right-sided chest wall port is again seen and stable the degree of central vascular congestion and infiltrative changes has nearly completely resolved in the interval from the prior exam. No sizable effusion is noted. No acute bony abnormality is seen. Postsurgical changes in the abdomen are noted. IMPRESSION: Near  complete resolution of previously seen vascular congestion and infiltrative changes. Electronically Signed   By: Alcide CleverMark  Lukens M.D.   On: 07/04/2017 08:09    Labs: BMET Recent Labs  Lab 06/29/17 0606 06/30/17 [id number] 06/30/17 1847 07/01/17 0316 07/02/17 0533 07/02/17 1531 07/03/17 0922 07/04/17 0847 07/05/17 0557  NA 141 140 141 141 145 141 141 140 140  K 4.3 2.4* 3.0* 3.6 2.7* 3.5 2.9* 3.4* 4.5  CL 112* 98* 99* 99* 103 100* 101 102 103  CO2 18* 32 32 30 30 28 28 26 25   GLUCOSE 78 145* 105* 95 71 93 136* 114* 79  BUN 43* 35* 35* 36* 37* 37* 25* 21* 24*  CREATININE 2.56* 2.28* 2.12* 2.20* 1.86* 1.61* 1.16* 1.29* 1.39*  CALCIUM 7.6* 7.0* 7.3* 8.0* 8.4* 8.7* 8.7* 8.8* 8.7*  PHOS 7.8* 3.3  --  6.2* 3.0  --  1.4* 3.0 3.9   CBC Recent Labs  Lab 06/29/17 0047  07/02/17 0534 07/03/17 0922 07/04/17 0847 07/05/17 0557  WBC 20.8*   < > 7.3 5.6 6.3 6.3  NEUTROABS 18.3*  --  5.3  --   --   --   HGB 10.1*   < > 8.0* 9.0* 9.8* 9.5*  HCT 33.9*   < > 25.6* 27.0* 27.9* 27.4*  MCV 94.7   < > 94.5 96.4 93.9 96.1  PLT 279   < > 217 222 253 259   < > =  values in this interval not displayed.    Medications:    . amLODipine  10 mg Oral QHS  . aspirin EC  81 mg Oral Daily  . budesonide (PULMICORT) nebulizer solution  0.5 mg Nebulization BID  . Chlorhexidine Gluconate Cloth  6 each Topical Daily  . cycloSPORINE modified  125 mg Oral BID  . furosemide  40 mg Oral BID  . guaiFENesin  1,200 mg Oral BID  . heparin  5,000 Units Subcutaneous Q8H  . ipratropium-albuterol  3 mL Nebulization BID  . mirabegron ER  25 mg Oral Daily  . mycophenolate  500 mg Oral BID  . pantoprazole  40 mg Oral QHS  . potassium chloride  40 mEq Oral Once  . pravastatin  40 mg Oral q1800  . predniSONE  2.5 mg Oral Q breakfast      Bufford ButtnerElizabeth Anouk Critzer, MD [facility] pgr [phone] 07/05/2017, 11:56 AM

## 2017-07-05 NOTE — Progress Notes (Signed)
SATURATION QUALIFICATIONS: (This note is used to comply with regulatory documentation for home oxygen)  Patient Saturations on Room Air at Rest = 96%  Patient Saturations on Room Air while Ambulating = 91%    Please briefly explain why patient needs home oxygen:  Pt does not qualify for home O2

## 2017-07-05 NOTE — Discharge Summary (Signed)
Physician Discharge Summary  Donna Shannon ZOX:[id number] DOB: [birth year] DOA: 06/29/2017  PCP: Patient, No Pcp Per  Admit date: 06/29/2017 Discharge date: 07/05/2017  Time spent: > 35 minutes  Recommendations for Outpatient Follow-up:  1. Ensure patient completes antibiotic regimen will have completed a 10 day course (opted for a longer course due to history of immunosuppression due to medications for patient with history of kidney transplant) 2. Sputum  Culture positive for candida but may just be contaminant as patient improving on antibiotic. Should condition worsen off antibiotics consider adding antifungal   Discharge Diagnoses:  Principal Problem:   Acute respiratory failure with hypoxia and hypercapnia (HCC) Active Problems:   Obesity   Kidney transplant status, living related donor   Neurogenic bladder   Hypertensive disorder   Stage 3 chronic kidney disease (HCC)   OSA (obstructive sleep apnea)   Retroperitoneal fibrosis   Vitamin D deficiency   ARDS (adult respiratory distress syndrome) (HCC)   GERD (gastroesophageal reflux disease)   Long-term use of immunosuppressant medication   Acute respiratory failure with hypoxia (HCC)   Mycoplasma pneumoniae pneumonia   Septic shock (HCC)   Acute metabolic encephalopathy   Acute kidney injury superimposed on CKD (HCC)   Respiratory acidosis   Diarrhea   Discharge Condition: stable  Diet recommendation: renal diet  Filed Weights   07/03/17 0510 07/04/17 0259 07/05/17 0304  Weight: 69.2 kg (152 lb 8 oz) 67 kg (147 lb 12.8 oz) 65.7 kg (144 lb 12.8 oz)    History of present illness:  31 year old female with PMH of Factor V Leiden, Kidney disease s/p renal transplant x 2, retroperitoneal fibrosis, requires urinary self catheterization, and CVA  7/23 underwent left salpingoopherectomy, admission 8/17-8/18 for ARDS. Transferred to [facility]   12/21 patient presents to [facility] ED with ongoing cough and dyspnea.  Worked up for Right lower lobe PNA and sepsis, given IV Levaquin. Father transferred patient to [facility] for further workup. Upon arrival to ED oxygen saturation 57% on room air. Intubated. Administered Cefepime and Vancomycin. CT Chest with diffuse bilateral airspace opacities concerning ARDS. PCCM asked to admit.  Patient admitted by PCCM intubated placed empirically on IV antibiotics and pancultured.  As patient improved she was subsequently extubated and transferred to the stepdown unit and care transferred to [facility].  Nephrology following for acute on chronic kidney disease.  Hospital Course:   #1 acute respiratory failure with hypoxia and hypercapnia with respiratory acidosis concerning for ARDS/mycoplasma pneumonia in the immunocompromised patient with history of severe obstructive sleep apnea Patient was admitted placed on the critical care service.  Patient pancultured and intubated on admission.  Blood cultures and urine cultures negative to date.  Sputum cultures positive for Candida.  Respiratory viral panel positive for mycoplasma pneumonia.  Urine Legionella and urine pneumococcus antigens were negative.  MRSA PCR negative.  Patient initially placed empirically on IV vancomycin, cefepime and azithromycin.  Vancomycin subsequently discontinued 07/01/2017 and patient currently on IV azithromycin and cefepime.  Patient extubated 07/01/2017 and currently on nasal cannula.  Chest x-ray unchanged with diffuse bilateral infiltrates still concerning for ARDS.  Patient less rhonchorous than she was on 07/03/2017 however still with diffuse coarse breath sounds. She is noted to have a urine output of 3.3 L over the past 24 hours.  Continue empiric IV azithromycin and IV cefepime.  Continue oral prednisone 2.5 mg daily.  Lasix dose was increased to 40 mg twice daily per nephrology.  Continue Mucinex, scheduled nebulizer  treatments, Pulmicort, chest PT.  - transitioning to oral  antibiotics as outpatient to complete 3 more days then d/c  2.  Septic shock Secondary to problem #1.  Not on pressors.  Blood pressure improved on current regimen. Lopressor was held due to low heart rates.  Blood cultures with no growth to date.  Urine Legionella and urine pneumococcus antigen were negative.  Respiratory viral panel positive for mycoplasma pneumonia.  Sputum cultures positive for Candida. But was improving on antibiotics. Antifungal could interfere with cellcept as such will hold. Should respiratory condition get worse just on antibiotics may have to restart antifungal. Pt has close follow up.  3.  Hypertension Blood pressure improved on current regimen of Lasix, Norvasc 10 mg daily.  Elevated this morning.  The patient's home dose Lopressor held due to bouts of bradycardia.  4.  Acute metabolic encephalopathy Likely secondary to problem #1.  Improved.  See problem #1. - resolved  5.  History of hyperlipidemia Continue statin.  6.  Factor V Leiden Outpatient follow-up.  7.  Acute renal failure on chronic kidney disease stage III/status post kidney transplant.  Baseline creatinine 1.6-1.8. Likely secondary to a prerenal azotemia as patient noted to have presented in septic shock.  Improving.  Patient with a urine output of 3.3 L over the past 24 hours.  Creatinine currently at 1.29.  Continue current dose of prednisone, cyclosporine, CellCept.  Continue lasix dose outlined by nephro  Nephrology following.  Follow.  8.  Hypophosphatemia/hypomagnesemia/hypokalemia Nephrology managing.  9.  Iron deficiency anemia H&H stable. s/p IV Feraheme.  Nephrology following.  10.  Depression Stable.  Continue home regimen Zoloft.  11.  Neurogenic bladder Discontinue Foley catheter. I and O cath every 6 hours as needed.  Continue myrbetriq.  12.  Obesity Patient follows up in the outpatient setting at the weight loss clinic.  13.  Gastroesophageal reflux disease.    PPI.  14 diarrhea resolved   Procedures:  None  Consultations:  Nephrology  Discharge Exam: Vitals:   07/05/17 0828 07/05/17 1109  BP: (!) 119/94 (!) 131/93  Pulse: 86 67  Resp:  (!) 28  Temp: 98.7 F (37.1 C) 98.5 F (36.9 C)  SpO2:  96%    General: Pt in nad, alert and awake Cardiovascular: rrr, no rubs Respiratory: no increased wob, no wheezes  Discharge Instructions   Discharge Instructions    Call MD for:  severe uncontrolled pain   Complete by:  As directed    Call MD for:  temperature >100.4   Complete by:  As directed    Diet - low sodium heart healthy   Complete by:  As directed    Increase activity slowly   Complete by:  As directed      Allergies as of 07/05/2017      Reactions   Phenazopyridine Other (See Comments)   Caused liver functions to go down and jaundice    Vancomycin Itching   Versed [midazolam] Itching   Sulfa Antibiotics Itching      Medication List    STOP taking these medications   metoprolol tartrate 50 MG tablet Commonly known as:  LOPRESSOR     TAKE these medications   acetaZOLAMIDE 250 MG tablet Commonly known as:  DIAMOX Take 250 mg by mouth 2 (two) times daily.   ALPRAZolam 0.5 MG tablet Commonly known as:  XANAX Take 1 tablet (0.5 mg total) by mouth 3 (three) times daily as needed for anxiety. May take 1-2 30-60  minutes before LP. Do not drive.   amLODipine 10 MG tablet Commonly known as:  NORVASC Take 1 tablet (10 mg total) by mouth at bedtime.   aspirin EC 81 MG tablet Take 81 mg by mouth daily.   azithromycin 500 MG tablet Commonly known as:  ZITHROMAX Take 1 tablet (500 mg total) by mouth daily for 3 days. Take 1 tablet daily for 3 days.   cephALEXin 500 MG capsule Commonly known as:  KEFLEX Take 1 capsule (500 mg total) by mouth 2 (two) times daily for 3 days.   cholecalciferol 1000 units tablet Commonly known as:  VITAMIN D Take 1,000 Units by mouth daily.   clonazePAM 0.5 MG  tablet Commonly known as:  KLONOPIN Take 0.5 mg by mouth 3 (three) times daily as needed for anxiety.   cycloSPORINE modified 25 MG capsule Commonly known as:  NEORAL Take 125 mg by mouth 2 (two) times daily.   furosemide 40 MG tablet Commonly known as:  LASIX Take 1 tablet (40 mg total) by mouth 2 (two) times daily. What changed:  when to take this   gabapentin 300 MG capsule Commonly known as:  NEURONTIN Take 300 mg by mouth 3 (three) times daily.   lidocaine 2 % jelly Commonly known as:  XYLOCAINE Apply 1 application topically daily as needed (pain).   methylphenidate 10 MG tablet Commonly known as:  RITALIN Take 10 mg by mouth 3 (three) times daily.   mirabegron ER 25 MG Tb24 tablet Commonly known as:  MYRBETRIQ Take 1 tablet (25 mg total) by mouth daily. Start taking on:  07/06/2017 What changed:  medication strength   mycophenolate 500 MG tablet Commonly known as:  CELLCEPT TAKE 1 TABLET BY MOUTH TWICE A DAY   ondansetron 8 MG tablet Commonly known as:  ZOFRAN Take 4 mg by mouth every 8 (eight) hours as needed for nausea or vomiting.   potassium chloride 10 MEQ tablet Commonly known as:  K-DUR,KLOR-CON Take 1 tablet (10 mEq total) by mouth daily.   pravastatin 40 MG tablet Commonly known as:  PRAVACHOL Take 40 mg by mouth at bedtime.   predniSONE 2.5 MG tablet Commonly known as:  DELTASONE Take 2.5 mg by mouth daily with breakfast.   promethazine 25 MG suppository Commonly known as:  PHENERGAN UNWRAP AND INSERT 1 SUPPOSITORY RECTALLY EVERY 6 HOURS AS NEEDED FOR NAUSEA OR VOMITING   ranitidine 75 MG tablet Commonly known as:  ZANTAC Take 75 mg by mouth daily.   rizatriptan 10 MG tablet Commonly known as:  MAXALT Take 10 mg by mouth daily as needed for migraine. May repeat in 2 hours if needed   sertraline 100 MG tablet Commonly known as:  ZOLOFT Take 100 mg by mouth daily.   sodium bicarbonate 650 MG tablet Take 1,300 mg by mouth 2 (two) times  daily.   zolpidem 5 MG tablet Commonly known as:  AMBIEN Take 5 mg by mouth at bedtime.      Allergies  Allergen Reactions  . Phenazopyridine Other (See Comments)    Caused liver functions to go down and jaundice   . Vancomycin Itching  . Versed [Midazolam] Itching  . Sulfa Antibiotics Itching      The results of significant diagnostics from this hospitalization (including imaging, microbiology, ancillary and laboratory) are listed below for reference.    Significant Diagnostic Studies: Ct Chest Wo Contrast  Result Date: 06/29/2017 CLINICAL DATA:  Subacute onset of cough.  Sepsis. EXAM: CT CHEST WITHOUT CONTRAST  TECHNIQUE: Multidetector CT imaging of the chest was performed following the standard protocol without IV contrast. COMPARISON:  CT of the chest performed 02/22/2017 FINDINGS: Cardiovascular: The heart is normal in size. A right-sided chest port is noted ending about the right ventricle. The thoracic aorta is grossly unremarkable. The great vessels are within normal limits. Mediastinum/Nodes: Trace pericardial fluid remains within normal limits. No definite mediastinal lymphadenopathy is seen. The patient's endotracheal tube is seen ending 2 cm above the carina. An enteric tube is noted extending into the stomach. The visualized portions of the thyroid gland are unremarkable. No axillary lymphadenopathy is seen. Lungs/Pleura: Diffuse bilateral airspace opacification is noted, more dense at the lower lung lobes, and particularly hazy at the upper lung lobes, concerning for ARDS. Underlying pneumonia or flash pulmonary edema cannot be excluded. No pleural effusion or pneumothorax is seen. Upper Abdomen: The visualized portions of the liver and spleen are unremarkable. The visualized portions of the gallbladder and pancreas are within normal limits. Musculoskeletal: No acute osseous abnormalities are identified. The visualized musculature is unremarkable in appearance. IMPRESSION:  Diffuse bilateral airspace opacification, more dense at the lower lung lobes, concerning for ARDS. Underlying pneumonia or flash pulmonary edema cannot be excluded. Electronically Signed   By: Roanna Raider M.D.   On: 06/29/2017 04:11   Dg Chest Port 1 View  Result Date: 07/04/2017 CLINICAL DATA:  ARDS EXAM: PORTABLE CHEST 1 VIEW COMPARISON:  07/03/2017 FINDINGS: Cardiac shadow is stable. Right-sided chest wall port is again seen and stable the degree of central vascular congestion and infiltrative changes has nearly completely resolved in the interval from the prior exam. No sizable effusion is noted. No acute bony abnormality is seen. Postsurgical changes in the abdomen are noted. IMPRESSION: Near complete resolution of previously seen vascular congestion and infiltrative changes. Electronically Signed   By: Alcide Clever M.D.   On: 07/04/2017 08:09   Dg Chest Port 1 View  Result Date: 07/03/2017 CLINICAL DATA:  Cough and shortness of breath. Acute respiratory failure. EXAM: PORTABLE CHEST 1 VIEW COMPARISON:  07/02/2017 and 07/01/2017 FINDINGS: Power port in place with the tip in the right atrium, unchanged. Slight progression of diffuse bilateral pulmonary infiltrates. No discrete effusions. Heart size is within normal limits. Pulmonary vascularity is obscured by the infiltrates. IMPRESSION: Progressive diffuse bilateral pulmonary infiltrates. Power port tip is in the right atrium. Electronically Signed   By: Francene Boyers M.D.   On: 07/03/2017 07:39   Dg Chest Port 1 View  Result Date: 07/02/2017 CLINICAL DATA:  ET tube, respiratory failure EXAM: PORTABLE CHEST 1 VIEW COMPARISON:  07/01/2017 FINDINGS: Interval removal of endotracheal tube and NG tube. Left Port-A-Cath remains in place, unchanged. Cardiomegaly. Diffuse bilateral airspace disease slightly worsened since prior study. IMPRESSION: Interval extubation. Diffuse bilateral airspace disease has slightly worsened. Electronically Signed    By: Charlett Nose M.D.   On: 07/02/2017 07:15   Dg Chest Port 1 View  Result Date: 07/01/2017 CLINICAL DATA:  Followup ventilator support EXAM: PORTABLE CHEST 1 VIEW COMPARISON:  06/30/2017 FINDINGS: Endotracheal tube tip is 2.5 cm above the carina. Nasogastric tube enters the stomach. Power port tip remains in the right atrium. Bilateral pulmonary infiltrates persist without dense consolidation. No new finding. IMPRESSION: Persistent bilateral pneumonia.  No new finding. Electronically Signed   By: Paulina Fusi M.D.   On: 07/01/2017 06:58   Dg Chest Port 1 View  Result Date: 06/30/2017 CLINICAL DATA:  Ventilator dependent EXAM: PORTABLE CHEST 1 VIEW COMPARISON:  Yesterday  FINDINGS: Endotracheal tube tip between the clavicular heads and carina. An orogastric tube reaches the stomach. Porta catheter on the right with tip at the right atrium, likely near the tricuspid valve. Bilateral airspace disease as seen on CT yesterday. There has been some improvement. No visible effusion or pneumothorax. IMPRESSION: 1. Stable positioning of tubes and lines. 2. Notable porta catheter tip positioning in the deep right atrium, near the tricuspid valve. Recommend specific follow-up. 3. Bilateral airspace disease is improved from yesterday, rapid evolution favoring improving edema. Electronically Signed   By: Marnee Spring M.D.   On: 06/30/2017 06:42   Dg Chest Portable 1 View  Result Date: 06/29/2017 CLINICAL DATA:  31 year old female status post intubation. EXAM: PORTABLE CHEST 1 VIEW COMPARISON:  Earlier Chest radiograph dated 06/29/2017 FINDINGS: There has been interval placement of an endotracheal tube with tip approximately 2 cm above the carina. Right-sided Port-A-Cath in similar positioning as before. Enteric tube with sideport over the stomach and tip beyond the inferior margin of the image. Diffuse bilateral, left greater right, and lower lobe predominant airspace opacities again noted most likely  representing ARDS. Probable small pleural effusions. No pneumothorax. Stable cardiac silhouette. No acute osseous pathology. Surgical clips noted over the upper abdomen. IMPRESSION: 1. Interval placement of an endotracheal tube with tip approximately 2 cm above the carina. Enteric tube extends into the stomach. 2. Interval worsening of the bilateral airspace opacity compared to the earlier radiograph. Electronically Signed   By: Elgie Collard M.D.   On: 06/29/2017 02:29   Dg Chest Port 1 View  Result Date: 06/29/2017 CLINICAL DATA:  Cough and bronchitis EXAM: PORTABLE CHEST 1 VIEW COMPARISON:  Chest radiograph 02/23/2017 FINDINGS: Dual-lumen right chest wall Port-A-Cath tip is in the right atrium. There are left-greater-than-right bibasilar opacities. No pleural effusion or pneumothorax. Mild cardiomegaly. Chronic peribronchial thickening. IMPRESSION: Mild cardiomegaly and peribronchial thickening consistent with chronic bronchitis. Electronically Signed   By: Deatra Robinson M.D.   On: 06/29/2017 01:11    Microbiology: Recent Results (from the past 240 hour(s))  Blood Culture (routine x 2)     Status: None   Collection Time: 06/29/17 12:44 AM  Result Value Ref Range Status   Specimen Description BLOOD RIGHT FOREARM  Final   Special Requests   Final    BOTTLES DRAWN AEROBIC AND ANAEROBIC Blood Culture adequate volume   Culture NO GROWTH 5 DAYS  Final   Report Status 07/04/2017 FINAL  Final  Blood Culture (routine x 2)     Status: None   Collection Time: 06/29/17  1:03 AM  Result Value Ref Range Status   Specimen Description BLOOD RIGHT HAND  Final   Special Requests IN PEDIATRIC BOTTLE Blood Culture adequate volume  Final   Culture NO GROWTH 5 DAYS  Final   Report Status 07/04/2017 FINAL  Final  Urine culture     Status: None   Collection Time: 06/29/17  4:33 AM  Result Value Ref Range Status   Specimen Description URINE, CATHETERIZED  Final   Special Requests NONE  Final   Culture NO  GROWTH  Final   Report Status 06/30/2017 FINAL  Final  Culture, respiratory (NON-Expectorated)     Status: None   Collection Time: 06/29/17  4:53 AM  Result Value Ref Range Status   Specimen Description TRACHEAL ASPIRATE  Final   Special Requests NONE  Final   Gram Stain   Final    ABUNDANT WBC PRESENT,BOTH PMN AND MONONUCLEAR RARE YEAST  Culture FEW CANDIDA ALBICANS  Final   Report Status 07/01/2017 FINAL  Final  Respiratory Panel by PCR     Status: Abnormal   Collection Time: 06/29/17  4:53 AM  Result Value Ref Range Status   Adenovirus NOT DETECTED NOT DETECTED Final   Coronavirus 229E NOT DETECTED NOT DETECTED Final   Coronavirus HKU1 NOT DETECTED NOT DETECTED Final   Coronavirus NL63 NOT DETECTED NOT DETECTED Final   Coronavirus OC43 NOT DETECTED NOT DETECTED Final   Metapneumovirus NOT DETECTED NOT DETECTED Final   Rhinovirus / Enterovirus NOT DETECTED NOT DETECTED Final   Influenza A NOT DETECTED NOT DETECTED Final   Influenza B NOT DETECTED NOT DETECTED Final   Parainfluenza Virus 1 NOT DETECTED NOT DETECTED Final   Parainfluenza Virus 2 NOT DETECTED NOT DETECTED Final   Parainfluenza Virus 3 NOT DETECTED NOT DETECTED Final   Parainfluenza Virus 4 NOT DETECTED NOT DETECTED Final   Respiratory Syncytial Virus NOT DETECTED NOT DETECTED Final   Bordetella pertussis NOT DETECTED NOT DETECTED Final   Chlamydophila pneumoniae NOT DETECTED NOT DETECTED Final   Mycoplasma pneumoniae DETECTED (A) NOT DETECTED Final  MRSA PCR Screening     Status: None   Collection Time: 06/29/17  4:55 AM  Result Value Ref Range Status   MRSA by PCR NEGATIVE NEGATIVE Final    Comment:        The GeneXpert MRSA Assay (FDA approved for NASAL specimens only), is one component of a comprehensive MRSA colonization surveillance program. It is not intended to diagnose MRSA infection nor to guide or monitor treatment for MRSA infections.   C difficile quick scan w PCR reflex     Status: None    Collection Time: 07/04/17 10:56 AM  Result Value Ref Range Status   C Diff antigen NEGATIVE NEGATIVE Final   C Diff toxin NEGATIVE NEGATIVE Final   C Diff interpretation No C. difficile detected.  Final     Labs: Basic Metabolic Panel: Recent Labs  Lab 07/01/17 0316 07/02/17 0533 07/02/17 1531 07/03/17 0922 07/04/17 0847 07/05/17 0557  NA 141 145 141 141 140 140  K 3.6 2.7* 3.5 2.9* 3.4* 4.5  CL 99* 103 100* 101 102 103  CO2 30 30 28 28 26 25   GLUCOSE 95 71 93 136* 114* 79  BUN 36* 37* 37* 25* 21* 24*  CREATININE 2.20* 1.86* 1.61* 1.16* 1.29* 1.39*  CALCIUM 8.0* 8.4* 8.7* 8.7* 8.8* 8.7*  MG 2.1 2.1  --  1.6* 1.5* 1.8  PHOS 6.2* 3.0  --  1.4* 3.0 3.9   Liver Function Tests: Recent Labs  Lab 06/29/17 0047  07/01/17 0316 07/02/17 0533 07/03/17 0922 07/04/17 0847 07/05/17 0557  AST 54*  --   --   --   --   --   --   ALT 21  --   --   --   --   --   --   ALKPHOS 42  --   --   --   --   --   --   BILITOT 0.9  --   --   --   --   --   --   PROT 6.4*  --   --   --   --   --   --   ALBUMIN 2.8*   < > 2.0* 2.0* 2.3* 2.6* 2.6*   < > = values in this interval not displayed.   No results for input(s): LIPASE, AMYLASE in the  last 168 hours. No results for input(s): AMMONIA in the last 168 hours. CBC: Recent Labs  Lab 06/29/17 0047  07/01/17 0316 07/02/17 0534 07/03/17 0922 07/04/17 0847 07/05/17 0557  WBC 20.8*   < > 9.8 7.3 5.6 6.3 6.3  NEUTROABS 18.3*  --   --  5.3  --   --   --   HGB 10.1*   < > 8.4* 8.0* 9.0* 9.8* 9.5*  HCT 33.9*   < > 26.9* 25.6* 27.0* 27.9* 27.4*  MCV 94.7   < > 92.8 94.5 96.4 93.9 96.1  PLT 279   < > 201 217 222 253 259   < > = values in this interval not displayed.   Cardiac Enzymes: Recent Labs  Lab 06/29/17 0247 06/29/17 0607 06/29/17 1651  TROPONINI <0.03 <0.03 <0.03   BNP: BNP (last 3 results) Recent Labs    07/11/16 1904  BNP 7.3    ProBNP (last 3 results) No results for input(s): PROBNP in the last 8760  hours.  CBG: Recent Labs  Lab 07/04/17 1155 07/04/17 1627 07/04/17 2050 07/05/17 0612 07/05/17 1106  GLUCAP 84 109* 92 80 82       Signed:  Penny Pia MD.  [facility] 07/05/2017, 3:48 PM

## 2017-08-16 ENCOUNTER — Other Ambulatory Visit: Payer: Self-pay | Admitting: Neurology

## 2017-10-01 ENCOUNTER — Institutional Professional Consult (permissible substitution): Payer: Medicare Other | Admitting: Internal Medicine

## 2017-10-21 ENCOUNTER — Other Ambulatory Visit: Payer: Self-pay | Admitting: Neurology

## 2017-10-25 ENCOUNTER — Telehealth: Payer: Self-pay | Admitting: Neurology

## 2017-10-25 NOTE — Telephone Encounter (Signed)
Can you call patient and get her an appt in a few weeks whatever she likes doesn't sound urgent. thanks

## 2017-10-28 NOTE — Telephone Encounter (Addendum)
Called pt & offered her an appt for Monday May 13th @ 1:00, arrival time 12:30. She verbalized appreciation and agreement.    Pt also offered a 11/06/17 appt but she preferred to keep the 5/13 appt.

## 2017-11-04 ENCOUNTER — Other Ambulatory Visit: Payer: Self-pay | Admitting: Neurology

## 2017-11-18 ENCOUNTER — Ambulatory Visit: Payer: Self-pay | Admitting: Neurology

## 2017-11-22 ENCOUNTER — Other Ambulatory Visit: Payer: Self-pay | Admitting: Neurology

## 2017-11-25 ENCOUNTER — Other Ambulatory Visit: Payer: Self-pay | Admitting: Neurology

## 2017-11-25 MED ORDER — PROMETHAZINE HCL 50 MG PO TABS: 50.0000 mg | ORAL_TABLET | Freq: Four times a day (QID) | ORAL | 11 refills | Status: DC | PRN

## 2017-11-26 ENCOUNTER — Telehealth: Payer: Self-pay | Admitting: Neurology

## 2017-11-26 NOTE — Telephone Encounter (Signed)
Called pt & LVM asking for call back. When she calls back, please offer appt for ov on Thurs 5/30 @ 4:00 pm if it is still available.

## 2017-11-26 NOTE — Telephone Encounter (Signed)
Pt called to schedule an appt. She said it needs to be soon as she has been sick the past 4 days. Pt states she is having pain behind the eyes. Please call to advise, Dr Lucia Gaskins is booked into August

## 2017-11-27 NOTE — Telephone Encounter (Addendum)
Patient called office. RN informed pt that her parents called office stating that she had signs of increased pressure and were concerned about her. She just woke up but said she feels fine so far today. She would like to be seen next Thursday 5/30 by Dr. Lucia Gaskins to discuss migraine meds, etc. She denies slurred speech, unilateral weakness, numbness/tingling, headache, vision changes, balance issues. She said sometimes she has trouble getting her thoughts out and she has done this "kinda" in the past with migraines. Overall she said she feels fine. She stated that she would go to the ED if she gets worse. RN also encouraged pt to call back if needed between now and then to see if there are any cancellations if she feels she needs to be seen sooner. Pt verbalized understanding. Pt was scheduled for next Thurs 5/20 @ 4:00 pm arrival time 3:30.

## 2017-11-27 NOTE — Telephone Encounter (Signed)
I spoke to patient, she is fine, parents mean well but jumped the gun. She does not feel she needs immediate attention appt next week is fine thanks

## 2017-11-27 NOTE — Telephone Encounter (Addendum)
Pt's mother Stanton Kidney on [place]) called said the pt cannot wait until next Thursday she is very concerned she will get worse. She said the pt is not "acting right", and has started saying things that don't make sense, left eye is drooping. She said the pt is not sleeping soundly. She is thinking fluid is building in the brain. She is requesting Dr Lucia Gaskins to call her back at [phone]

## 2017-11-27 NOTE — Telephone Encounter (Addendum)
Spoke with pt's mother & father (both on [place]), mostly with father. RN advised for pt to go to the ED with these symptoms of not "acting right", and saying things that don't make sense, left eye drooping. The mother stated that [facility] "won't touch her" since she's a transplant patient. Pt's father stated that he does not think the patient is having a stroke, he has experience with rescue. He says that she is just showing some signs of how she did in the past when she had increased pressure. She ate dinner last night, said some things that didn't make sense, but claimed she was joking. She got an infusion yesterday for a migraine. He said her eye looked fine to him yesterday. The patient was sleeping at the time of the call. Her father stated that in her sleep, her mother saw the patient slinging her arm around and kicking her legs some. RN advised again if these are acute changes we advise her to go to the ED. They can use their judgment but we recommend ED. MD unable to see pt today. Will speak with Dr. Lucia Gaskins and call them back today. He verbalized understanding and appreciation.

## 2017-11-28 ENCOUNTER — Telehealth: Payer: Self-pay | Admitting: *Deleted

## 2017-11-28 NOTE — Telephone Encounter (Signed)
Faxed infusion standing orders to [facility]. Received a receipt of confirmation.

## 2017-12-05 ENCOUNTER — Telehealth: Payer: Self-pay | Admitting: Neurology

## 2017-12-05 ENCOUNTER — Encounter: Payer: Self-pay | Admitting: Neurology

## 2017-12-05 ENCOUNTER — Ambulatory Visit (INDEPENDENT_AMBULATORY_CARE_PROVIDER_SITE_OTHER): Payer: Medicare Other | Admitting: Neurology

## 2017-12-05 VITALS — BP 103/73 | HR 69 | Ht 62.0 in | Wt 137.6 lb

## 2017-12-05 DIAGNOSIS — G43711 Chronic migraine without aura, intractable, with status migrainosus: Secondary | ICD-10-CM

## 2017-12-05 NOTE — Progress Notes (Signed)
[facility]    Provider: Dr Lucia Gaskins Referring Provider: Annie Sable, MD Primary Care Physician: No PCP Per Patient  CC: Chronic migraine  - Patient has a long history of migraines.  - She has tried multiple medications.  - She eats well, she exercises regularly, she is at a normal healthy weight.  - There is no medication overuse. There is no aura. - Migraines are unilateral and spread to the whole head, often behind the eyes, pulsating and throbbing, +light and sound sensitivity, +nausea and vomiting - Are moderately severe to severe enough to need to go for an infusion. - She has baseline 15 migraine days a month for more than the last 6 months. They can last up to 24 hours and be severe - Movement makes it worse - She has recently lost weight and has no signs or symptoms of pseudotumor cerebri.  She had a very bad migraine that started in the setting of stress with severe nausea, she haa an infusion migraine cocktail which helped but it was severe for several days. After there infusion she felt good for a day but ever since then she is having even more headaches. Migraine is unilateral and spreads to behind the eyes, pressure and pulsating and throbbing, nausea and vomiting, light and sound sensitivity. She has vomited in the past. Laying down helps and being still helps, movement makes it worse. She has recently lost weight and has no signs or symptoms of pseudotumor cerebri.  Review of Systems: Patient complains of symptoms per HPI as well as the following symptoms: tremor, abdominal incision. Pertinent negatives and positives per HPI. All others negative.   Social History   Socioeconomic History  . Marital status: Single    Spouse name: Not on file  . Number of children: Not on file  . Years of education: Not on file  . Highest education level: Not on file  Occupational History  . Occupation: LPN -school nurse  Social Needs  . Financial  resource strain: Not on file  . Food insecurity:    Worry: Not on file    Inability: Not on file  . Transportation needs:    Medical: Not on file    Non-medical: Not on file  Tobacco Use  . Smoking status: Former Smoker    Packs/day: 0.50    Years: 4.00    Pack years: 2.00    Types: Cigarettes    Last attempt to quit: 07/09/2014    Years since quitting: 3.4  . Smokeless tobacco: Never Used  Substance and Sexual Activity  . Alcohol use: Yes    Comment: 04/24/2016 "might have 2 drinks/month, if that"  . Drug use: No  . Sexual activity: Never    Birth control/protection: Pill  Lifestyle  . Physical activity:    Days per week: Not on file    Minutes per session: Not on file  . Stress: Not on file  Relationships  . Social connections:    Talks on phone: Not on file    Gets together: Not on file    Attends religious service: Not on file    Active member of club or organization: Not on file    Attends meetings of clubs or organizations: Not on file    Relationship status: Not on file  . Intimate partner violence:    Fear of current or ex partner: Not on file    Emotionally abused: Not on file    Physically abused: Not on file  Forced sexual activity: Not on file  Other Topics Concern  . Not on file  Social History Narrative   Lives mother and father   Caffeine use: 1 cup soda/day    Family History  Problem Relation Age of Onset  . Hypertension Mother   . CAD Father        s/p CABG  . Hyperlipidemia Father   . Heart disease Father   . Thyroid disease Father     Past Medical History:  Diagnosis Date  . Anemia   . Anxiety   . Arthritis    "left knee" (04/24/2016)  . Back pain   . Chronic lower back pain   . Constipation   . Depression   . Edema   . Essential hypertension   . Factor V Leiden (HCC)    [name] 04/24/2016  . Frequent UTI    [name] 04/24/2016  . GERD (gastroesophageal reflux disease)   . Gout   . High cholesterol   . HTN (hypertension)   .  Kidney disease   . Migraine    "weekly" (04/24/2016)  . Neurogenic bladder    augmented bladder, I/O self caths  . On home oxygen therapy    "2L; every night" (04/24/2016)  . Osteoporosis   . Ovarian cyst dx'd 04/22/2016   left  . Palpitations   . Pneumonia 03/2016   [name] 04/24/2016  . Renal transplant failure and rejection    age 68-11  . 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 Seizures (HCC)   . Self-catheterizes urinary bladder    "~ 12 X/day" (04/24/2016)  . SOB (shortness of breath)   . Stroke [facility]) 1991   denies residual on 04/24/2016  . Vitamin D deficiency     Past Surgical History:  Procedure Laterality Date  . ABDOMINAL HERNIA REPAIR  "several; when I was little"  . allograph biopsy  2013   [name] 04/24/2016  . HERNIA REPAIR    . KIDNEY TRANSPLANT  1996; 1998   father was donor; mother was donor/notes 04/24/2016  . PACEMAKER INSERTION  "?early 2000s"   for bladder function  . PARTIAL NEPHRECTOMY  1990s X 5   "removing disease"  . PORTACATH PLACEMENT Right 11/2015  . RIGHT OOPHORECTOMY Right 2001   ovarian torsion    Current Outpatient Medications  Medication Sig Dispense Refill  . acetaZOLAMIDE (DIAMOX) 250 MG tablet TAKE 2 TABLETS BY MOUTH TWICE DAILY 120 tablet 3  . aspirin EC 81 MG tablet Take 81 mg by mouth daily.    . cholecalciferol (VITAMIN D) 1000 units tablet Take 1,000 Units by mouth daily.    . clonazePAM (KLONOPIN) 0.5 MG tablet Take 0.5 mg by mouth 3 (three) times daily as needed for anxiety.     . cycloSPORINE modified (NEORAL) 25 MG capsule Take 125 mg by mouth 2 (two) times daily.    . DULoxetine (CYMBALTA) 30 MG capsule   11  . furosemide (LASIX) 40 MG tablet Take 1 tablet (40 mg total) by mouth 2 (two) times daily. 60 tablet 0  . gabapentin (NEURONTIN) 300 MG capsule Take 300 mg by mouth 3 (three) times daily.     . methylphenidate (RITALIN) 10 MG tablet Take 10 mg by mouth 3 (three)  times daily.     . mirabegron ER (MYRBETRIQ) 25 MG TB24 tablet Take 1 tablet (25 mg total) by mouth daily. 30 tablet 0  . mycophenolate (CELLCEPT) 500  MG tablet TAKE 1 TABLET BY MOUTH TWICE A DAY    . potassium chloride (K-DUR,KLOR-CON) 10 MEQ tablet Take 1 tablet (10 mEq total) by mouth daily. 15 tablet 0  . pravastatin (PRAVACHOL) 40 MG tablet Take 40 mg by mouth at bedtime.    . predniSONE (DELTASONE) 2.5 MG tablet Take 2.5 mg by mouth daily with breakfast.     . promethazine (PHENERGAN) 25 MG suppository UNWRAP AND INSERT 1 SUPPOSITORY RECTALLY EVERY 6 HOURS AS NEEDED FOR NAUSEA OR VOMITING 12 suppository 0  . promethazine (PHENERGAN) 50 MG tablet Take 1 tablet (50 mg total) by mouth every 6 (six) hours as needed for nausea or vomiting. 60 tablet 11  . ranitidine (ZANTAC) 75 MG tablet Take 75 mg by mouth daily.     . rizatriptan (MAXALT) 10 MG tablet Take 10 mg by mouth daily as needed for migraine. May repeat in 2 hours if needed    . sodium bicarbonate 650 MG tablet Take 1,300 mg by mouth 2 (two) times daily.    [name] zolpidem (AMBIEN) 5 MG tablet Take 5 mg by mouth at bedtime.     No current facility-administered medications for this visit.     Allergies as of 12/05/2017 - Review Complete 12/05/2017  Allergen Reaction Noted  . Phenazopyridine Other (See Comments) 06/29/2017  . Vancomycin Itching 06/29/2017  . Versed [midazolam] Itching 06/29/2017  . Sulfa antibiotics Itching 06/25/2016    Vitals: BP 103/73   Pulse 69   Ht  (1.575 m)   Wt 137 lb 9.6 oz (62.4 kg)   BMI 25.17 kg/m  Last Weight:  Wt Readings from Last 1 Encounters:  12/05/17 137 lb 9.6 oz (62.4 kg)   Last Height:   Ht Readings from Last 1 Encounters:  12/05/17  (1.575 m)   Physical exam: Exam: Gen: NAD, conversant, well nourised, well groomed                     CV: RRR, no MRG. No Carotid Bruits. No peripheral edema, warm, nontender Eyes: Conjunctivae clear without exudates or  hemorrhage  Neuro: Detailed Neurologic Exam  Speech:    Speech is normal; fluent and spontaneous with normal comprehension.  Cognition:    The patient is oriented to person, place, and time;     recent and remote memory intact;     language fluent;     normal attention, concentration,     fund of knowledge Cranial Nerves:    The pupils are equal, round, and reactive to light. The fundi are normal and spontaneous venous pulsations are present. Visual fields are full to finger confrontation. Extraocular movements are intact. Trigeminal sensation is intact and the muscles of mastication are normal. The face is symmetric. The palate elevates in the midline. Hearing intact. Voice is normal. Shoulder shrug is normal. The tongue has normal motion without fasciculations.   Motor Observation:    High frequency tremor. Tone:    Normal muscle tone.    Posture:    Posture is normal. normal erect    Strength:    Strength is V/V in the upper and lower limbs.        Assessment/Plan:Donna Shannon a 32 y.o. femalehere as a referral from Dr. Dara Lords intractable migraines . 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  Recommend botox for migraine. Has tried Diamox, cymbalta, gabapentin. Phenergan, maxalt, fioricet, flexeril, Keppra, zofran, zoloft,  compazine, paxil, metoprolol  - Patient has a long history of migraines.  - She has tried multiple medications.  - She eats well, she exercises regularly, she is at a normal healthy weight.  - There is no medication overuse. There is no aura. - Migraines are unilateral and spread to the whole head, often behind the eyes, pulsating and throbbing, +light and sound sensitivity, +nausea and vomiting - Are moderately severe to severe enough to need to go for an infusion. - She has baseline 15 migraine days a month for  more than the last 6 months. They can last up to 24 hours and be severe - Movement makes it worse - She has recently lost weight and has no signs or symptoms of pseudotumor cerebri.   Naomie Dean, MD  [facility] [address] [place], [place] [address]  Phone [phone] Fax [phone]  A total of 25 minutes was spent face-to-face with this patient. Over half this time was spent on counseling patient on the chronic intractable migraines diagnosis and different diagnostic and therapeutic options available.

## 2017-12-05 NOTE — Telephone Encounter (Signed)
Bethany: can you please fill out a form for botox for migraine G43.711  Duwayne Heck can you call her to set up Botox? Can she do buy and bill? Id love to get her in next week if she is buy and bill and of medicare doesn't take a long time to get approval (she is medicare with secondary medicaid). Let me know thanks

## 2017-12-09 NOTE — Telephone Encounter (Addendum)
Received a patient visit record from [facility]. However the page was unable to be read. Called [facility] and requested for copy to be faxed again. Gave them our fax number [phone] and they will be faxing it again.

## 2017-12-09 NOTE — Telephone Encounter (Signed)
Botox form initiated per Dr. Lucia GaskinsAhern.

## 2017-12-09 NOTE — Telephone Encounter (Addendum)
Received clear report from patient visit. She received infusion for migraine and tolerated well. Dr. Lucia GaskinsAhern made aware.

## 2017-12-10 NOTE — Telephone Encounter (Signed)
Verified insurance with patient. No pre cert is required. Patient has been scheduled for next week.

## 2017-12-17 ENCOUNTER — Other Ambulatory Visit: Payer: Self-pay | Admitting: Neurology

## 2017-12-17 DIAGNOSIS — G932 Benign intracranial hypertension: Secondary | ICD-10-CM

## 2017-12-18 ENCOUNTER — Other Ambulatory Visit: Payer: Self-pay | Admitting: *Deleted

## 2017-12-18 ENCOUNTER — Telehealth: Payer: Self-pay | Admitting: *Deleted

## 2017-12-18 ENCOUNTER — Ambulatory Visit (INDEPENDENT_AMBULATORY_CARE_PROVIDER_SITE_OTHER): Payer: Medicare Other | Admitting: Neurology

## 2017-12-18 ENCOUNTER — Encounter: Payer: Self-pay | Admitting: Neurology

## 2017-12-18 VITALS — BP 95/65 | HR 69

## 2017-12-18 DIAGNOSIS — G43711 Chronic migraine without aura, intractable, with status migrainosus: Secondary | ICD-10-CM

## 2017-12-18 MED ORDER — ACETAZOLAMIDE 250 MG PO TABS: 750.0000 mg | ORAL_TABLET | Freq: Two times a day (BID) | ORAL | 3 refills | Status: DC

## 2017-12-18 MED ORDER — ERENUMAB-AOOE 140 MG/ML ~~LOC~~ SOAJ: 140.0000 mg | SUBCUTANEOUS | 11 refills | Status: DC

## 2017-12-18 NOTE — Progress Notes (Signed)
Botox- 100 units x 2 vials Lot: [id number] Expiration: 05/2020 NDC: 0023-1145-01  Bacteriostatic 0.9% Sodium Chloride- 4mL total Lot: [id number] Expiration: 11/07/2018 NDC: 0409-1966-02  Dx: G43.711 B/B   

## 2017-12-18 NOTE — Progress Notes (Signed)
Per Dr. Lucia GaskinsAhern, increase Acetazolamide to 750 mg twice daily using the 250 mg tabs. Refill for one year using 3 month supply.   E-scribed to pt's pharmacy. Pharmacy note included.

## 2017-12-18 NOTE — Telephone Encounter (Signed)
Pt coming for infusion around 1:45 today. Spoke with pt on phone and informed her that per infusion she can come as planned between 11:30-1 or 1:45. Pt Botox appt at 4:00 today. Pt agreed to come at 1:45 for infusion.

## 2017-12-18 NOTE — Telephone Encounter (Signed)
Orders written for infusion per Dr. Lucia GaskinsAhern. Pt agreed to the meds to be given.  Toradol 30 mg IV x 1  Depacon 1 gram IV x 1 Solumedrol 250 mg IV x 1 Compazine 10 mg IV x 1  Orders and copy of insurance card given to infusion staff.

## 2017-12-19 ENCOUNTER — Telehealth: Payer: Self-pay

## 2017-12-19 NOTE — Telephone Encounter (Signed)
PA done on cover my meds for Aimovig.  [facility]

## 2017-12-19 NOTE — Telephone Encounter (Signed)
PA approve on cover my meds,approve from 09/20/2017 to 03/19/2018.

## 2017-12-26 ENCOUNTER — Inpatient Hospital Stay: Admission: RE | Admit: 2017-12-26 | Payer: Medicare Other | Source: Ambulatory Visit

## 2017-12-27 ENCOUNTER — Other Ambulatory Visit: Payer: Self-pay | Admitting: Neurology

## 2018-01-27 ENCOUNTER — Other Ambulatory Visit: Payer: Self-pay | Admitting: Neurology

## 2018-01-27 DIAGNOSIS — G43711 Chronic migraine without aura, intractable, with status migrainosus: Secondary | ICD-10-CM

## 2018-01-27 MED ORDER — ERENUMAB-AOOE 140 MG/ML ~~LOC~~ SOAJ: 140.0000 mg | SUBCUTANEOUS | 11 refills | Status: DC

## 2018-02-03 ENCOUNTER — Telehealth: Payer: Self-pay | Admitting: Neurology

## 2018-02-03 NOTE — Telephone Encounter (Signed)
Toma CopierBethany, please place Judeth CornfieldStephanie in the 11:30am appointment slot today for worsening headache. thanks

## 2018-02-04 ENCOUNTER — Ambulatory Visit (INDEPENDENT_AMBULATORY_CARE_PROVIDER_SITE_OTHER): Payer: Medicare Other | Admitting: Neurology

## 2018-02-04 ENCOUNTER — Encounter: Payer: Self-pay | Admitting: Neurology

## 2018-02-04 VITALS — BP 102/69 | HR 83 | Ht 62.0 in | Wt 140.0 lb

## 2018-02-04 DIAGNOSIS — G9340 Encephalopathy, unspecified: Secondary | ICD-10-CM

## 2018-02-04 DIAGNOSIS — S139XXA Sprain of joints and ligaments of unspecified parts of neck, initial encounter: Secondary | ICD-10-CM | POA: Diagnosis not present

## 2018-02-04 DIAGNOSIS — N39498 Other specified urinary incontinence: Secondary | ICD-10-CM | POA: Diagnosis not present

## 2018-02-04 DIAGNOSIS — R410 Disorientation, unspecified: Secondary | ICD-10-CM

## 2018-02-04 DIAGNOSIS — G3281 Cerebellar ataxia in diseases classified elsewhere: Secondary | ICD-10-CM

## 2018-02-04 DIAGNOSIS — R419 Unspecified symptoms and signs involving cognitive functions and awareness: Secondary | ICD-10-CM

## 2018-02-04 DIAGNOSIS — M436 Torticollis: Secondary | ICD-10-CM

## 2018-02-04 NOTE — Telephone Encounter (Signed)
done

## 2018-02-04 NOTE — Progress Notes (Signed)
[facility]    Provider: Dr Lucia Gaskins Referring Provider: Annie Sable, MD Primary Care Physician: Annie Sable, MD  CC: Chronic migraine  Interval history 02/04/2018: She has been doing exceptionally well on Botox and Aimovig, has gone several months without a headache.  This past weekend it was terrible, [place] (4 days ago) and was feeling fine. She woke up Saturday and couldn;t raise her head all day long, she literally could not do it, she had to hold it up, thought it was a "crick" tightness when she tried to raise it it was painful, she couldn't mover he head all day, no fevers, no chills, no headache. Sheis "missing pieces of Saturday" and it came to a head when she went to dinner Saturday night they went to a buffet and she kept falling asleep and was confused at times, no new medications, didn;t drink alcohol, didn;t miss any medications. On Sunday she drank a [employer]. Having weakness in the arms as well. She had imbalance. Acute weakness of legs. Today she feels extremely sore in the legs. She is having incontinence. Her urine smells horrible. It is gel like and "stinks so bad".  Her neck is better, feels a little better just very sore. She feels tired, her vision is blurry, her neck hurts and her urine smells very bad.   Past history:   - Patient has a long history of migraines.  - She has tried multiple medications.  - She eats well, she exercises regularly, she is at a normal healthy weight.  - There is no medication overuse. There is no aura. - Migraines are unilateral and spread to the whole head, often behind the eyes, pulsating and throbbing, +light and sound sensitivity, +nausea and vomiting - Are moderately severe to severe enough to need to go for an infusion. - She has baseline 15 migraine days a month for more than the last 6 months. They can last up to 24 hours and be severe - Movement makes it  worse - She has recently lost weight and has no signs or symptoms of pseudotumor cerebri.  She had a very bad migraine that started in the setting of stress with severe nausea, she haa an infusion migraine cocktail which helped but it was severe for several days. After there infusion she felt good for a day but ever since then she is having even more headaches. Migraine is unilateral and spreads to behind the eyes, pressure and pulsating and throbbing, nausea and vomiting, light and sound sensitivity. She has vomited in the past. Laying down helps and being still helps, movement makes it worse. She has recently lost weight and has no signs or symptoms of pseudotumor cerebri.  Review of Systems: Patient complains of symptoms per HPI as well as the following symptoms: tremor, abdominal incision. Pertinent negatives and positives per HPI. All others negative.   Social History   Socioeconomic History  . Marital status: Single    Spouse name: Not on file  . Number of children: Not on file  . Years of education: Not on file  . Highest education level: Associate degree: academic program  Occupational History  . Occupation: LPN -school nurse  Social Needs  . Financial resource strain: Not on file  . Food insecurity:    Worry: Not on file    Inability: Not on file  . Transportation needs:    Medical: Not on file    Non-medical: Not on file  Tobacco Use  . Smoking status: Former Smoker    Packs/day: 0.50    Years: 4.00    Pack years: 2.00    Types: Cigarettes    Last attempt to quit: 07/09/2014    Years since quitting: 3.5  . Smokeless tobacco: Never Used  Substance and Sexual Activity  . Alcohol use: Yes    Comment: 04/24/2016 "might have 2 drinks/month, if that"  . Drug use: No  . Sexual activity: Never    Birth control/protection: Pill  Lifestyle  . Physical activity:    Days per week: Not on file    Minutes per session: Not on file  . Stress: Not on file  Relationships  .  Social connections:    Talks on phone: Not on file    Gets together: Not on file    Attends religious service: Not on file    Active member of club or organization: Not on file    Attends meetings of clubs or organizations: Not on file    Relationship status: Not on file  . Intimate partner violence:    Fear of current or ex partner: Not on file    Emotionally abused: Not on file    Physically abused: Not on file    Forced sexual activity: Not on file  Other Topics Concern  . Not on file  Social History Narrative   Lives mother and father   Caffeine use: 1 cup soda/day    Family History  Problem Relation Age of Onset  . Hypertension Mother   . CAD Father        s/p CABG  . Hyperlipidemia Father   . Heart disease Father   . Thyroid disease Father   . Brain cancer Maternal Grandmother   . Stomach cancer Maternal Grandmother   . Brain cancer Maternal Grandfather   . Lung cancer Maternal Grandfather     Past Medical History:  Diagnosis Date  . Anemia   . Anxiety   . Arthritis    "left knee" (04/24/2016)  . Back pain   . Chronic lower back pain   . Constipation   . Depression   . Edema   . Essential hypertension   . Factor V Leiden (HCC)    [name] 04/24/2016  . Frequent UTI    [name] 04/24/2016  . GERD (gastroesophageal reflux disease)   . Gout   . High cholesterol   . HTN (hypertension)   . Kidney disease   . Migraine    "weekly" (04/24/2016)  . Neurogenic bladder    augmented bladder, I/O self caths  . On home oxygen therapy    "2L; every night" (04/24/2016)  . Osteoporosis   . Ovarian cyst dx'd 04/22/2016   left  . Palpitations   . Pneumonia 03/2016   [name] 04/24/2016  . Renal transplant failure and rejection    age 65-11  . 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 Seizures (HCC)   . Self-catheterizes urinary bladder    "~ 12 X/day" (04/24/2016)  . SOB (shortness of breath)   . Stroke [facility])  1991   denies residual on 04/24/2016  . Vitamin D deficiency     Past Surgical History:  Procedure Laterality Date  . ABDOMINAL HERNIA REPAIR  "several; when I was little"  . allograph biopsy  2013   [name] 04/24/2016  . HERNIA REPAIR    . KIDNEY TRANSPLANT  1996; 1998  father was donor; mother was donor/notes 04/24/2016  . PACEMAKER INSERTION  "?early 2000s"   for bladder function  . PARTIAL NEPHRECTOMY  1990s X 5   "removing disease"  . PORTACATH PLACEMENT Right 11/2015  . RIGHT OOPHORECTOMY Right 2001   ovarian torsion    Current Outpatient Medications  Medication Sig Dispense Refill  . acetaZOLAMIDE (DIAMOX) 250 MG tablet Take 3 tablets (750 mg total) by mouth 2 (two) times daily. 540 tablet 3  . aspirin EC 81 MG tablet Take 81 mg by mouth daily.    . cholecalciferol (VITAMIN D) 1000 units tablet Take 1,000 Units by mouth daily.    . clonazePAM (KLONOPIN) 0.5 MG tablet Take 0.5 mg by mouth 3 (three) times daily as needed for anxiety.     . cycloSPORINE modified (NEORAL) 25 MG capsule Take by mouth 2 (two) times daily. 125 mg in the morning and 100 mg at night    . DULoxetine (CYMBALTA) 30 MG capsule Take 60 mg by mouth daily.   11  . Erenumab-aooe (AIMOVIG) 140 MG/ML SOAJ Inject 140 mg into the skin every 30 (thirty) days. 1 pen 11  . flavoxATE (URISPAS) 100 MG tablet Take 2 tablets in the morning and 1 tablet at night  11  . gabapentin (NEURONTIN) 300 MG capsule Take 300 mg by mouth 3 (three) times daily.     . methylphenidate (RITALIN) 10 MG tablet Take 10 mg by mouth 4 (four) times daily.     . metoprolol succinate (TOPROL-XL) 100 MG 24 hr tablet Take 100 mg by mouth at bedtime.  6  . mycophenolate (CELLCEPT) 500 MG tablet TAKE 1 TABLET BY MOUTH TWICE A DAY    . MYRBETRIQ 25 MG TB24 tablet Take 1 tablet by mouth at bedtime.  11  . potassium chloride (K-DUR,KLOR-CON) 10 MEQ tablet Take 1 tablet (10 mEq total) by mouth daily. 15 tablet 0  . predniSONE (DELTASONE) 2.5 MG  tablet Take 2.5 mg by mouth daily with breakfast.     . ranitidine (ZANTAC) 75 MG tablet Take 75 mg by mouth daily.     . rizatriptan (MAXALT) 10 MG tablet Take 10 mg by mouth daily as needed for migraine. May repeat in 2 hours if needed    . sodium bicarbonate 650 MG tablet Take 1,950 mg by mouth 2 (two) times daily.     [name]. zolpidem (AMBIEN) 5 MG tablet Take 5 mg by mouth at bedtime.    . furosemide (LASIX) 40 MG tablet Take 1 tablet (40 mg total) by mouth 2 (two) times daily. (Patient not taking: Reported on 12/18/2017) 60 tablet 0  . promethazine (PHENERGAN) 25 MG suppository UNWRAP AND INSERT 1 SUPPOSITORY RECTALLY EVERY 6 HOURS AS NEEDED FOR NAUSEA OR VOMITING 12 suppository 0  . promethazine (PHENERGAN) 50 MG tablet Take 1 tablet (50 mg total) by mouth every 6 (six) hours as needed for nausea or vomiting. 60 tablet 11  . traMADol (ULTRAM) 50 MG tablet Take 50 mg by mouth as needed.  0   No current facility-administered medications for this visit.     Allergies as of 02/04/2018 - Review Complete 02/04/2018  Allergen Reaction Noted  . Phenazopyridine Other (See Comments) 06/29/2017  . Vancomycin Itching 06/29/2017  . Versed [midazolam] Itching 06/29/2017  . Sulfa antibiotics Itching 06/25/2016    Vitals: BP 102/69 (BP Location: Right Arm, Patient Position: Sitting)   Pulse 83   Ht 5\' 2"  (1.575 m)   Wt 140 lb (63.5 kg)  BMI 25.61 kg/m  Last Weight:  Wt Readings from Last 1 Encounters:  02/04/18 140 lb (63.5 kg)   Last Height:   Ht Readings from Last 1 Encounters:  02/04/18 5\' 2"  (1.575 m)   Physical exam: Exam: Gen: NAD, conversant, well nourised, well groomed                     CV: RRR, no MRG. No Carotid Bruits. No peripheral edema, warm, nontender Eyes: Conjunctivae clear without exudates or hemorrhage  Neuro: Detailed Neurologic Exam  Speech:    Speech is normal; fluent and spontaneous with normal comprehension. But appears with flat affect Cognition:    The  patient is oriented to person, place, and time;     recent and remote memory intact;     language fluent;     normal attention, concentration,     fund of knowledge Cranial Nerves:    The pupils are equal, round, and reactive to light. The fundi are normal and spontaneous venous pulsations are present. Visual fields are full to finger confrontation. Extraocular movements are intact. Trigeminal sensation is intact and the muscles of mastication are normal. The face is symmetric. The palate elevates in the midline. Hearing intact. Voice is normal. Shoulder shrug is normal. The tongue has normal motion without fasciculations.   Motor Observation:    High frequency tremor., ataxic and wide based Tone:     Increased in the cervical muscles, am able to rotate patient's head without significant pain or stiffness  Posture:    Posture is stooped with head forward protrusion    Strength:    Strength is V/V in the upper and lower limbs.        Assessment/Plan:Donna Shannon a 32 y.o. femalehere as a referral from Dr. Dara Lords intractable migraines . 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  Patient with very complicated PMHx as above with acute onset ataxia, confusion, neck pain, urinary changes. Need labs and urine today to check for infection or metabolic abnormalities. Also MRI of the brain and cervical spine (cannot use contrast due to renal hx) to evaluate for causes of seizures, infections such as meningitis, acute cord injury, and LP. Advised her to go to the ED but she declines, will order but if she worsens she is to go immediately. Encouraged ED due to possible urinary infection, she will consider.   Orders Placed This Encounter  Procedures  . Culture, Urine  . MR BRAIN WO CONTRAST  . MR CERVICAL SPINE WO CONTRAST  . DG FLUORO GUIDED LOC OF  NEEDLE/CATH TIP FOR SPINAL INJECT LT  . Comprehensive metabolic panel  . Urinalysis, Routine w reflex microscopic  . CBC with Differential/Platelets  . EEG    Migraines: doing exceptionally well on botox and Aimovig  Continue botox and Aimovig for migraine. Has tried Diamox, cymbalta, gabapentin. Phenergan, maxalt, fioricet, flexeril, Keppra, zofran, zoloft, compazine, paxil, metoprolol   - Patient has a long history of migraines.  - She has tried multiple medications.  - She eats well, she exercises regularly, she is at a normal healthy weight.  - There is no medication overuse. There is no aura. - Migraines are unilateral and spread to the whole head, often behind the eyes, pulsating and throbbing, +light and sound sensitivity, +nausea and vomiting - Are moderately severe to severe enough to need to go for an infusion. - She has baseline 15 migraine  days a month for more than the last 6 months. They can last up to 24 hours and be severe - Movement makes it worse - She has recently lost weight and has no signs or symptoms of pseudotumor cerebri.   Naomie Dean, MD  [facility] [address] [place], [place] [address]  Phone [phone] Fax [phone]  A total of 40 minutes was spent face-to-face with this patient. Over half this time was spent on counseling patient on the  1. Encephalopathy   2. Acute confusion   3. Other urinary incontinence   4. Acute neck sprain, initial encounter   5. Alteration of awareness   6. Neck stiffness   7. Cerebellar ataxia in diseases classified elsewhere [facility])    chronic intractable migraines diagnosis and different diagnostic and therapeutic options available.

## 2018-02-04 NOTE — Patient Instructions (Signed)
Proceed to ED for any worsening MRI brain and cervical spine Labs EEG Lumbar Puncture

## 2018-02-05 ENCOUNTER — Telehealth: Payer: Self-pay | Admitting: *Deleted

## 2018-02-05 LAB — URINALYSIS, ROUTINE W REFLEX MICROSCOPIC
Bilirubin, UA: NEGATIVE
Glucose, UA: NEGATIVE
Ketones, UA: NEGATIVE
Nitrite, UA: POSITIVE — AB
PH UA: 7 (ref 5.0–7.5)
PROTEIN UA: NEGATIVE
RBC, UA: NEGATIVE
Specific Gravity, UA: 1.011 (ref 1.005–1.030)
Urobilinogen, Ur: 0.2 mg/dL (ref 0.2–1.0)

## 2018-02-05 LAB — COMPREHENSIVE METABOLIC PANEL
ALT: 18 IU/L (ref 0–32)
AST: 57 IU/L — ABNORMAL HIGH (ref 0–40)
Albumin/Globulin Ratio: 1.9 (ref 1.2–2.2)
Albumin: 4.7 g/dL (ref 3.5–5.5)
Alkaline Phosphatase: 39 IU/L (ref 39–117)
BILIRUBIN TOTAL: 0.4 mg/dL (ref 0.0–1.2)
BUN / CREAT RATIO: 17 (ref 9–23)
BUN: 36 mg/dL — ABNORMAL HIGH (ref 6–20)
CHLORIDE: 101 mmol/L (ref 96–106)
CO2: 22 mmol/L (ref 20–29)
Calcium: 9.3 mg/dL (ref 8.7–10.2)
Creatinine, Ser: 2.11 mg/dL — ABNORMAL HIGH (ref 0.57–1.00)
GFR, EST AFRICAN AMERICAN: 34 mL/min/{1.73_m2} — AB (ref >59)
GFR, EST NON AFRICAN AMERICAN: 30 mL/min/{1.73_m2} — AB (ref >59)
GLOBULIN, TOTAL: 2.5 g/dL (ref 1.5–4.5)
Glucose: 98 mg/dL (ref 65–99)
Potassium: 3.4 mmol/L — ABNORMAL LOW (ref 3.5–5.2)
SODIUM: 143 mmol/L (ref 134–144)
TOTAL PROTEIN: 7.2 g/dL (ref 6.0–8.5)

## 2018-02-05 LAB — MICROSCOPIC EXAMINATION
Casts: NONE SEEN /lpf
RBC, UA: NONE SEEN /hpf (ref 0–2)
WBC, UA: >30 /hpf — AB (ref 0–5)

## 2018-02-05 LAB — CBC WITH DIFFERENTIAL/PLATELET
BASOS: 0 %
Basophils Absolute: 0 10*3/uL (ref 0.0–0.2)
EOS (ABSOLUTE): 0.1 10*3/uL (ref 0.0–0.4)
EOS: 1 %
HEMATOCRIT: 33.1 % — AB (ref 34.0–46.6)
HEMOGLOBIN: 9.9 g/dL — AB (ref 11.1–15.9)
Immature Grans (Abs): 0 10*3/uL (ref 0.0–0.1)
Immature Granulocytes: 0 %
LYMPHS ABS: 1.5 10*3/uL (ref 0.7–3.1)
Lymphs: 21 %
MCH: 28.5 pg (ref 26.6–33.0)
MCHC: 29.9 g/dL — ABNORMAL LOW (ref 31.5–35.7)
MCV: 95 fL (ref 79–97)
MONOCYTES: 6 %
Monocytes Absolute: 0.5 10*3/uL (ref 0.1–0.9)
NEUTROS ABS: 5.1 10*3/uL (ref 1.4–7.0)
Neutrophils: 72 %
Platelets: 292 10*3/uL (ref 150–450)
RBC: 3.47 x10E6/uL — ABNORMAL LOW (ref 3.77–5.28)
RDW: 14.1 % (ref 12.3–15.4)
WBC: 7.2 10*3/uL (ref 3.4–10.8)

## 2018-02-05 NOTE — Telephone Encounter (Signed)
-----   Message from Anson FretAntonia B Ahern, MD sent at 02/05/2018 10:47 AM EDT ----- Kidney function worsening, she needs to call her kidney physician Creatining is 2.11

## 2018-02-05 NOTE — Telephone Encounter (Signed)
Spoke with patient and informed her of lab results. Discussed that Dr. Lucia GaskinsAhern says her kidney function is worsening. Her creatinine is now 2.11 and she needs to call her kidney doctor. Patient verbalized understanding. She had no questions. RN encouraged pt to check mychart which has the lab results. Pt didn't have the activation code. RN gave her the support number to call [phone]. Pt verbalized appreciation.

## 2018-02-06 ENCOUNTER — Ambulatory Visit (INDEPENDENT_AMBULATORY_CARE_PROVIDER_SITE_OTHER): Payer: Medicare Other | Admitting: Diagnostic Neuroimaging

## 2018-02-06 ENCOUNTER — Telehealth: Payer: Self-pay | Admitting: Neurology

## 2018-02-06 DIAGNOSIS — R419 Unspecified symptoms and signs involving cognitive functions and awareness: Secondary | ICD-10-CM

## 2018-02-06 DIAGNOSIS — N39498 Other specified urinary incontinence: Secondary | ICD-10-CM

## 2018-02-06 DIAGNOSIS — R410 Disorientation, unspecified: Secondary | ICD-10-CM

## 2018-02-06 DIAGNOSIS — G9340 Encephalopathy, unspecified: Secondary | ICD-10-CM

## 2018-02-06 DIAGNOSIS — G3281 Cerebellar ataxia in diseases classified elsewhere: Secondary | ICD-10-CM

## 2018-02-06 DIAGNOSIS — S139XXA Sprain of joints and ligaments of unspecified parts of neck, initial encounter: Secondary | ICD-10-CM

## 2018-02-06 DIAGNOSIS — M436 Torticollis: Secondary | ICD-10-CM

## 2018-02-06 LAB — URINE CULTURE

## 2018-02-06 NOTE — Telephone Encounter (Signed)
Spoke to patient about urinalysis with +nitrite, +3 leukoesterase and > 30 WBCs. Told her she needs to call Dr. Logan BoresEvans for treatment today. Forwarded all labs to Dr. Logan BoresEvans. Called Dr. Logan BoresEvans office to make them aware to look for labs and to ensure Dr. Logan BoresEvans has the results today. Spoke to tawana.

## 2018-02-07 NOTE — Procedures (Signed)
   [facility]  EEG (ELECTROENCEPHALOGRAM) REPORT   STUDY DATE: 02/06/18 PATIENT NAME: Donna EvenerStephanie Sahakian DOB: 08/04/1985 MRN: [id number]  ORDERING CLINICIAN: Naomie DeanAntonia Ahern, MD   TECHNOLOGIST: Charlett BlakeN Willard TECHNIQUE: Electroencephalogram was recorded utilizing standard 10-20 system of lead placement and reformatted into average and bipolar montages.  RECORDING TIME: 21 minutes  ACTIVATION: hyperventilation and photic stimulation  CLINICAL INFORMATION: 32 year old female with confusion  FINDINGS: Posterior dominant background rhythms, which attenuate with eye opening, ranging 9-10 hertz and 20-30 microvolts. No focal, lateralizing, epileptiform activity or seizures are seen. Patient recorded in the awake and drowsy state. EKG channel shows regular rhythm of 80-85 beats per minute.   IMPRESSION:   Normal EEG in the awake and drowsy states.    INTERPRETING PHYSICIAN:  Suanne MarkerVIKRAM R. Jenesis Suchy, MD Certified in Neurology, Neurophysiology and Neuroimaging  [facility] [address], [address] [place], [place] [address] [phone]

## 2018-02-10 ENCOUNTER — Ambulatory Visit: Payer: Medicare Other | Admitting: Neurology

## 2018-02-10 ENCOUNTER — Telehealth: Payer: Self-pay | Admitting: *Deleted

## 2018-02-10 NOTE — Telephone Encounter (Signed)
Cancel MRI brain & C-spine per Dr. Lucia GaskinsAhern.

## 2018-02-10 NOTE — Telephone Encounter (Signed)
-----   Message from Anson FretAntonia B Ahern, MD sent at 02/08/2018  8:56 AM EDT ----- eeg is normal, patient cannot receive via mychart please call thanks

## 2018-02-10 NOTE — Telephone Encounter (Signed)
Called pt and informed of her normal EEG. Pt verbalized understanding and appreciation and had no questions.

## 2018-02-10 NOTE — Telephone Encounter (Signed)
Noted, thank you

## 2018-02-17 ENCOUNTER — Ambulatory Visit
Admission: RE | Admit: 2018-02-17 | Discharge: 2018-02-17 | Disposition: A | Discharge status: Home / Self Care | Payer: Medicare Other | Source: Ambulatory Visit | Attending: Neurology | Admitting: Neurology

## 2018-02-17 VITALS — BP 109/75 | HR 78

## 2018-02-17 DIAGNOSIS — M436 Torticollis: Secondary | ICD-10-CM

## 2018-02-17 DIAGNOSIS — N39498 Other specified urinary incontinence: Secondary | ICD-10-CM

## 2018-02-17 DIAGNOSIS — G932 Benign intracranial hypertension: Secondary | ICD-10-CM

## 2018-02-17 DIAGNOSIS — R410 Disorientation, unspecified: Secondary | ICD-10-CM

## 2018-02-17 DIAGNOSIS — R419 Unspecified symptoms and signs involving cognitive functions and awareness: Secondary | ICD-10-CM

## 2018-02-17 DIAGNOSIS — G3281 Cerebellar ataxia in diseases classified elsewhere: Secondary | ICD-10-CM

## 2018-02-17 DIAGNOSIS — G9340 Encephalopathy, unspecified: Secondary | ICD-10-CM

## 2018-02-17 DIAGNOSIS — S139XXA Sprain of joints and ligaments of unspecified parts of neck, initial encounter: Secondary | ICD-10-CM

## 2018-02-17 LAB — CSF CELL COUNT WITH DIFFERENTIAL
BASOPHILS, %: 0 %
EOS CSF: 0 %
Lymphs, CSF: 84 % — ABNORMAL HIGH (ref 40–80)
Monocyte/Macrophage: 16 % (ref 15–45)
RBC Count, CSF: 0 cells/uL (ref 0–10)
SEGMENTED NEUTROPHILS-CSF: 0 % (ref 0–6)
WBC, CSF: 7 cells/uL — ABNORMAL HIGH (ref 0–5)

## 2018-02-17 LAB — GLUCOSE, CSF: Glucose, CSF: 67 mg/dL (ref 40–80)

## 2018-02-17 LAB — PROTEIN, CSF: Total Protein, CSF: 42 mg/dL (ref 15–45)

## 2018-02-17 NOTE — Discharge Instructions (Signed)

## 2018-02-19 ENCOUNTER — Telehealth: Payer: Self-pay | Admitting: *Deleted

## 2018-02-19 NOTE — Telephone Encounter (Addendum)
Called pt and LVM asking for call back.  ----- Message from Anson FretAntonia B Ahern, MD sent at 02/18/2018 10:37 AM EDT ----- CSF unremarkable. She had 7 WBCs in her csf but I am not concerned as she does not have any symptoms of meningitis.  Notes recorded by Anson FretAhern, Antonia B, MD on 02/18/2018 at 10:33 AM EDT Opening pressure is essentially normal, please let patient know thanks

## 2018-02-20 ENCOUNTER — Other Ambulatory Visit: Payer: Self-pay | Admitting: Neurology

## 2018-02-20 DIAGNOSIS — G971 Other reaction to spinal and lumbar puncture: Secondary | ICD-10-CM

## 2018-02-21 ENCOUNTER — Ambulatory Visit
Admission: RE | Admit: 2018-02-21 | Discharge: 2018-02-21 | Disposition: A | Discharge status: Home / Self Care | Payer: Medicare Other | Source: Ambulatory Visit | Attending: Neurology | Admitting: Neurology

## 2018-02-21 ENCOUNTER — Other Ambulatory Visit: Payer: Medicare Other

## 2018-02-21 DIAGNOSIS — G971 Other reaction to spinal and lumbar puncture: Secondary | ICD-10-CM

## 2018-02-21 MED ORDER — IOPAMIDOL (ISOVUE-M 200) INJECTION 41%
1.0000 mL | Freq: Once | INTRAMUSCULAR | Status: AC
  Administered 2018-02-21: 1 mL via EPIDURAL

## 2018-02-21 NOTE — Progress Notes (Signed)
Blood obtained from pt's R AC for blood patch. Drawn by Donnamarie PoagJeanne, RN. Pt tolerated procedure well.

## 2018-02-21 NOTE — Discharge Instructions (Signed)
Blood Patch Discharge Instructions  1. Go home and rest quietly for the next 24 hours.  It is important to lie flat for the next 24 hours.  Get up only to go to the restroom.  You may lie in the bed or on a couch on your back, your stomach, your left side or your right side.  You may have one pillow under your head.  You may have pillows between your knees while you are on your side or under your knees while you are on your back.  2. DO NOT drive today.  Recline the seat as far back as it will go, while still wearing your seat belt, on the way home.  3. You may get up to go to the bathroom as needed.  You may sit up for 10 minutes to eat.  You may resume your normal diet and medications unless otherwise indicated.  Drink a lot of extra fluids today and tomorrow..   4. You may resume normal activities after your 24 hours of bed rest is over; however, do not exert yourself strongly or do any heavy lifting tomorrow.  5. Call your physician for a follow-up appointment.   6. If you have any questions  after you arrive home, please call [phone].  Discharge instructions have been explained to the patient.  The patient, or the person responsible for the patient, fully understands these instructions.

## 2018-03-07 ENCOUNTER — Other Ambulatory Visit: Payer: Self-pay | Admitting: Neurology

## 2018-03-12 ENCOUNTER — Telehealth: Payer: Self-pay

## 2018-03-12 NOTE — Telephone Encounter (Signed)
Received approval letter for Aimovig reflecting same dates as noted below. For any questions contact [phone]. [facility] and informed pharmacist of the approval. He will run this through and contact the patient. He verbalized appreciation.

## 2018-03-12 NOTE — Telephone Encounter (Signed)
Received PA request for aimovig from covermymeds. KEY: [id number]. PA completed and sent to [facility] [employer]. PA was approved from 12/12/17-03/12/2019.

## 2018-03-21 ENCOUNTER — Telehealth: Payer: Self-pay | Admitting: Neurology

## 2018-03-21 NOTE — Telephone Encounter (Signed)
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  Pt asking to be called to r/s

## 2018-03-24 ENCOUNTER — Ambulatory Visit: Payer: Medicare Other | Admitting: Neurology

## 2018-03-26 NOTE — Telephone Encounter (Signed)
Pt has returned the call to Danielle re: her Botox, please call pt back

## 2018-03-26 NOTE — Telephone Encounter (Signed)
I called the patient and she answered but requested to call us back because she was at work.

## 2018-03-31 NOTE — Telephone Encounter (Signed)
I called and scheduled the patient.  °

## 2018-04-09 ENCOUNTER — Ambulatory Visit (INDEPENDENT_AMBULATORY_CARE_PROVIDER_SITE_OTHER): Payer: Medicare Other | Admitting: Neurology

## 2018-04-09 DIAGNOSIS — G43711 Chronic migraine without aura, intractable, with status migrainosus: Secondary | ICD-10-CM | POA: Diagnosis not present

## 2018-04-09 MED ORDER — ACETAZOLAMIDE 250 MG PO TABS: 500.0000 mg | ORAL_TABLET | Freq: Two times a day (BID) | ORAL | 11 refills | Status: AC

## 2018-04-09 NOTE — Progress Notes (Signed)
+eyes, +temples, +,masseters, did not do the traps we did it all in LS area.  Decrease Diamox to 500mg  twice daily x 2-4 weeks and then is stable decreased ro 250mg  (1 pill) twice daily.     Consent Form Botulism Toxin Injection For Chronic Migraine    Reviewed orally with patient, additionally signature is on file:  Botulism toxin has been approved by the Federal drug administration for treatment of chronic migraine. Botulism toxin does not cure chronic migraine and it may not be effective in some patients.  The administration of botulism toxin is accomplished by injecting a small amount of toxin into the muscles of the neck and head. Dosage must be titrated for each individual. Any benefits resulting from botulism toxin tend to wear off after 3 months with a repeat injection required if benefit is to be maintained. Injections are usually done every 3-4 months with maximum effect peak achieved by about 2 or 3 weeks. Botulism toxin is expensive and you should be sure of what costs you will incur resulting from the injection.  The side effects of botulism toxin use for chronic migraine may include:   -Transient, and usually mild, facial weakness with facial injections  -Transient, and usually mild, head or neck weakness with head/neck injections  -Reduction or loss of forehead facial animation due to forehead muscle weakness  -Eyelid drooping  -Dry eye  -Pain at the site of injection or bruising at the site of injection  -Double vision  -Potential unknown long term risks  Contraindications: You should not have Botox if you are pregnant, nursing, allergic to albumin, have an infection, skin condition, or muscle weakness at the site of the injection, or have myasthenia gravis, Lambert-Eaton syndrome, or ALS.  It is also possible that as with any injection, there may be an allergic reaction or no effect from the medication. Reduced effectiveness after repeated injections is sometimes seen  and rarely infection at the injection site may occur. All care will be taken to prevent these side effects. If therapy is given over a long time, atrophy and wasting in the muscle injected may occur. Occasionally the patient's become refractory to treatment because they develop antibodies to the toxin. In this event, therapy needs to be modified.  I have read the above information and consent to the administration of botulism toxin.    BOTOX PROCEDURE NOTE FOR MIGRAINE HEADACHE    Contraindications and precautions discussed with patient(above). Aseptic procedure was observed and patient tolerated procedure. Procedure performed by Dr. Artemio Aly  The condition has existed for more than 6 months, and pt does not have a diagnosis of ALS, Myasthenia Gravis or Lambert-Eaton Syndrome.  Risks and benefits of injections discussed and pt agrees to proceed with the procedure.  Written consent obtained  These injections are medically necessary. Pt  receives good benefits from these injections. These injections do not cause sedations or hallucinations which the oral therapies may cause.  Indication/Diagnosis: chronic migraine BOTOX(J0585) injection was performed according to protocol by [employer]. 200 units of BOTOX was dissolved into 4 cc NS.   NDC: [address]-95   Description of procedure:  The patient was placed in a sitting position. The standard protocol was used for Botox as follows, with 5 units of Botox injected at each site:   -Procerus muscle, midline injection  -Corrugator muscle, bilateral injection  -Frontalis muscle, bilateral injection, with 2 sites each side, medial injection was performed in the upper one third of the frontalis muscle, in the  region vertical from the medial inferior edge of the superior orbital rim. The lateral injection was again in the upper one third of the forehead vertically above the lateral limbus of the cornea, 1.5 cm lateral to the medial injection  site.  -Temporalis muscle injection, 4 sites, bilaterally. The first injection was 3 cm above the tragus of the ear, second injection site was 1.5 cm to 3 cm up from the first injection site in line with the tragus of the ear. The third injection site was 1.5-3 cm forward between the first 2 injection sites. The fourth injection site was 1.5 cm posterior to the second injection site.  -Occipitalis muscle injection, 3 sites, bilaterally. The first injection was done one half way between the occipital protuberance and the tip of the mastoid process behind the ear. The second injection site was done lateral and superior to the first, 1 fingerbreadth from the first injection. The third injection site was 1 fingerbreadth superiorly and medially from the first injection site.  -Cervical paraspinal muscle injection, 2 sites, bilateral knee first injection site was 1 cm from the midline of the cervical spine, 3 cm inferior to the lower border of the occipital protuberance. The second injection site was 1.5 cm superiorly and laterally to the first injection site.  -Trapezius muscle injection was performed at 3 sites, bilaterally. The first injection site was in the upper trapezius muscle halfway between the inflection point of the neck, and the acromion. The second injection site was one half way between the acromion and the first injection site. The third injection was done between the first injection site and the inflection point of the neck.   Will return for repeat injection in 3 months.   A 200 unit sof Botox was used, 155 units were injected, the rest of the Botox was wasted. The patient tolerated the procedure well, there were no complications of the above procedure.

## 2018-04-09 NOTE — Progress Notes (Signed)
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   

## 2018-06-30 ENCOUNTER — Other Ambulatory Visit: Payer: Self-pay | Admitting: Neurology

## 2018-07-22 ENCOUNTER — Ambulatory Visit (INDEPENDENT_AMBULATORY_CARE_PROVIDER_SITE_OTHER): Payer: Medicare Other | Admitting: Neurology

## 2018-07-22 DIAGNOSIS — G43711 Chronic migraine without aura, intractable, with status migrainosus: Secondary | ICD-10-CM

## 2018-07-22 NOTE — Progress Notes (Signed)
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    

## 2018-07-22 NOTE — Progress Notes (Signed)
Consent Form Botulism Toxin Injection For Chronic Migraine  Interval history 07/12/2018: Doing exceptionally well, > 75% decrease in migraine frequency. +eyes, +temples, +,masseters, extra in the right rhomboids    Reviewed orally with patient, additionally signature is on file:  Botulism toxin has been approved by the Federal drug administration for treatment of chronic migraine. Botulism toxin does not cure chronic migraine and it may not be effective in some patients.  The administration of botulism toxin is accomplished by injecting a small amount of toxin into the muscles of the neck and head. Dosage must be titrated for each individual. Any benefits resulting from botulism toxin tend to wear off after 3 months with a repeat injection required if benefit is to be maintained. Injections are usually done every 3-4 months with maximum effect peak achieved by about 2 or 3 weeks. Botulism toxin is expensive and you should be sure of what costs you will incur resulting from the injection.  The side effects of botulism toxin use for chronic migraine may include:   -Transient, and usually mild, facial weakness with facial injections  -Transient, and usually mild, head or neck weakness with head/neck injections  -Reduction or loss of forehead facial animation due to forehead muscle weakness  -Eyelid drooping  -Dry eye  -Pain at the site of injection or bruising at the site of injection  -Double vision  -Potential unknown long term risks  Contraindications: You should not have Botox if you are pregnant, nursing, allergic to albumin, have an infection, skin condition, or muscle weakness at the site of the injection, or have myasthenia gravis, Lambert-Eaton syndrome, or ALS.  It is also possible that as with any injection, there may be an allergic reaction or no effect from the medication. Reduced effectiveness after repeated injections is sometimes seen and rarely infection at the injection  site may occur. All care will be taken to prevent these side effects. If therapy is given over a long time, atrophy and wasting in the muscle injected may occur. Occasionally the patient's become refractory to treatment because they develop antibodies to the toxin. In this event, therapy needs to be modified.  I have read the above information and consent to the administration of botulism toxin.    BOTOX PROCEDURE NOTE FOR MIGRAINE HEADACHE    Contraindications and precautions discussed with patient(above). Aseptic procedure was observed and patient tolerated procedure. Procedure performed by Dr. Artemio Alyoni Latroya Ng  The condition has existed for more than 6 months, and pt does not have a diagnosis of ALS, Myasthenia Gravis or Lambert-Eaton Syndrome.  Risks and benefits of injections discussed and pt agrees to proceed with the procedure.  Written consent obtained  These injections are medically necessary. Pt  receives good benefits from these injections. These injections do not cause sedations or hallucinations which the oral therapies may cause.  Indication/Diagnosis: chronic migraine BOTOX(J0585) injection was performed according to protocol by [employer]. 200 units of BOTOX was dissolved into 4 cc NS.   NDC: [address]-4000023-1145-01   Description of procedure:  The patient was placed in a sitting position. The standard protocol was used for Botox as follows, with 5 units of Botox injected at each site:   -Procerus muscle, midline injection  -Corrugator muscle, bilateral injection  -Frontalis muscle, bilateral injection, with 2 sites each side, medial injection was performed in the upper one third of the frontalis muscle, in the region vertical from the medial inferior edge of the superior orbital rim. The lateral injection was again in  the upper one third of the forehead vertically above the lateral limbus of the cornea, 1.5 cm lateral to the medial injection site.  -Temporalis muscle injection, 4  sites, bilaterally. The first injection was 3 cm above the tragus of the ear, second injection site was 1.5 cm to 3 cm up from the first injection site in line with the tragus of the ear. The third injection site was 1.5-3 cm forward between the first 2 injection sites. The fourth injection site was 1.5 cm posterior to the second injection site.  -Occipitalis muscle injection, 3 sites, bilaterally. The first injection was done one half way between the occipital protuberance and the tip of the mastoid process behind the ear. The second injection site was done lateral and superior to the first, 1 fingerbreadth from the first injection. The third injection site was 1 fingerbreadth superiorly and medially from the first injection site.  -Cervical paraspinal muscle injection, 2 sites, bilateral knee first injection site was 1 cm from the midline of the cervical spine, 3 cm inferior to the lower border of the occipital protuberance. The second injection site was 1.5 cm superiorly and laterally to the first injection site.  -Trapezius muscle injection was performed at 3 sites, bilaterally. The first injection site was in the upper trapezius muscle halfway between the inflection point of the neck, and the acromion. The second injection site was one half way between the acromion and the first injection site. The third injection was done between the first injection site and the inflection point of the neck.   Will return for repeat injection in 3 months.   A 200 unit sof Botox was used, 155 units were injected, the rest of the Botox was wasted. The patient tolerated the procedure well, there were no complications of the above procedure.

## 2018-09-18 ENCOUNTER — Ambulatory Visit (INDEPENDENT_AMBULATORY_CARE_PROVIDER_SITE_OTHER): Payer: Medicare Other | Admitting: Neurology

## 2018-09-18 ENCOUNTER — Other Ambulatory Visit: Payer: Self-pay

## 2018-09-18 ENCOUNTER — Encounter: Payer: Self-pay | Admitting: Neurology

## 2018-09-18 VITALS — BP 122/83 | HR 72 | Ht 62.0 in | Wt 138.0 lb

## 2018-09-18 DIAGNOSIS — G43711 Chronic migraine without aura, intractable, with status migrainosus: Secondary | ICD-10-CM | POA: Diagnosis not present

## 2018-09-18 DIAGNOSIS — R51 Headache: Secondary | ICD-10-CM

## 2018-09-18 DIAGNOSIS — R519 Headache, unspecified: Secondary | ICD-10-CM

## 2018-09-18 MED ORDER — METHYLPREDNISOLONE ACETATE 80 MG/ML IJ SUSP
80.0000 mg | Freq: Once | INTRAMUSCULAR | Status: AC
  Administered 2018-09-18: 80 mg via INTRAMUSCULAR

## 2018-09-18 NOTE — Progress Notes (Signed)
15 minutes 72902 x 2 ubrelvy 100mg  x 1 - [id number] c 04/2020 ndc 8022-3361-22 80 mg methylprednisolone [id number] b 1/21 ndc 5110-2111-73  Patient comes in today with intractable migraine for days, nothing helping.  Patient reporting significant stress at home, she lives at home with her parents at this time.  We discussed stress management and how stress plays a role in migraines.  Patient has tried her acute medications without relief.  We discussed plan today which could include nerve blocks, and fusion, cannot perform DHE on patient due to renal function, discussed sphenopalatine ganglion blocks as well.  After discussion, we decided we will perform nerve blocks and send to infusion if no improvement.  Patient's migraine improved from a 7 out of 10 to a 3 out of 10 with the nerve blocks however she still felt as though she was impaired and we brought her to infusion after which patient felt much improved almost 0 out of 10.  I followed up with patient at the end of the day and she was feeling much better.  A total of 15 minutes was spent face-to-face with this patient. Over half this time was spent on counseling patient on the  1. Chronic migraine without aura, with intractable migraine, so stated, with status migrainosus   2. Chronic intractable headache, unspecified headache type    diagnosis and different diagnostic and therapeutic options, counseling and coordination of care, risks ans benefits of management, compliance, or risk factor reduction and education.  This does not include time spent on nerve block or any other procedure.   Performed by Dr. Lucia Gaskins M.D.  All procedures a documented blood were medically necessary, reasonable and appropriate based on the patient's history, medical diagnosis and physician opinion. Verbal informed consent was obtained from the patient, patient was informed of potential risk of procedure, including bruising, bleeding, hematoma formation, infection, muscle weakness,  muscle pain, numbness, transient hypertension, transient hyperglycemia and transient insomnia among others. All areas injected were topically clean with isopropyl rubbing alcohol. Nonsterile nonlatex gloves were worn during the procedure.   Supraorbital nerve block (64400): Supraorbital nerve site was identified along the incision of the frontal bone on the orbital/supraorbital ridge. Medication was injected into the left and right supraorbital nerve areas. Patient's condition is associated with inflammation of the supraorbital and associated muscle groups. Injection was deemed medically necessary, reasonable and appropriate. Injection represents a separate and unique surgical service. surgical service.  Greater occipital nerve block [phone]). The greater occipital nerve site was identified at the nuchal line medial to the occipital artery. Medication was injected into the left and right occipital nerve areas and suboccipital areas. Patient's condition is associated with inflammation of the greater occipital nerve and associated multiple groups. Injection was deemed medically necessary, reasonable and appropriate. Injection represents a separate and unique surgical service.

## 2018-09-18 NOTE — Progress Notes (Signed)
Nerve block w/o steroid: Pt signed consent  0.5% Bupivocaine 5.25 mL LOT: [id number] EXP: 02/2020 NDC: 02725-366-44  2% Lidocaine 5.25 mL LOT: [id number] EXP: 02/07/2019 NDC: 0347-4259-56  Dr. Lucia Gaskins administered Depomedrol IM   Infusion order written for patient per Dr. Lucia Gaskins Depacon 1 gram IV x 1 Toradol 30 mg IV x 1 Compazine 10 mg IV x 1

## 2018-10-07 IMAGING — CT CT ABD-PELV W/O CM
2 of 4 series · 14 of 46 positions shown, 16 images · non-contrast
Comparison: CXR from 07/11/2016 fell the left CT abdomen 04/28/2016

CLINICAL DATA: Mediastinal fibrosis. History of renal transplant,
right oophorectomy, retroperitoneal fibrosis, hernia repair and
Port-A-Cath placement. Neurogenic bladder with frequent urinary
tract infections.

EXAM:
CT CHEST, ABDOMEN AND PELVIS WITHOUT CONTRAST
TECHNIQUE: Multidetector CT imaging of the chest, abdomen and pelvis was
performed following the standard protocol without IV contrast.

[Series 2: cap wo 5.0 i31f 1 · axial · 0.83mm/px · z∈[-599,-79]mm · 11 of 120 slices shown, 13 images]
[im 8/120  soft-tissue]
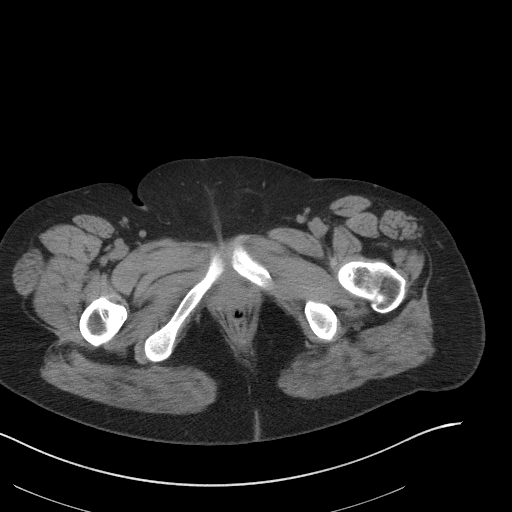
[im 8/120  bone]
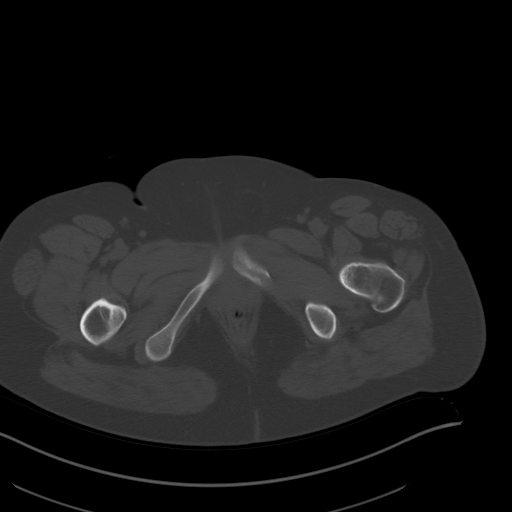
[im 23/120  soft-tissue]
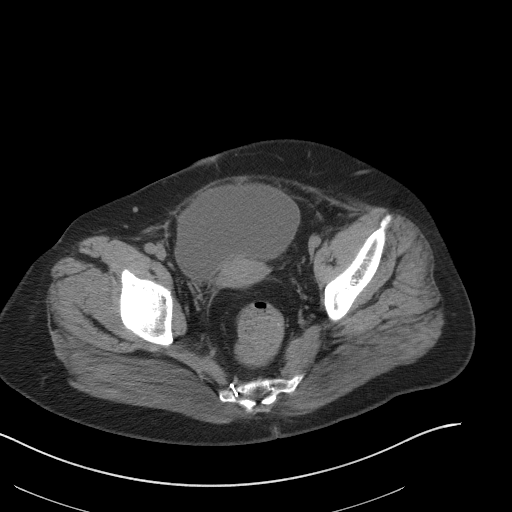
[im 30/120  soft-tissue]
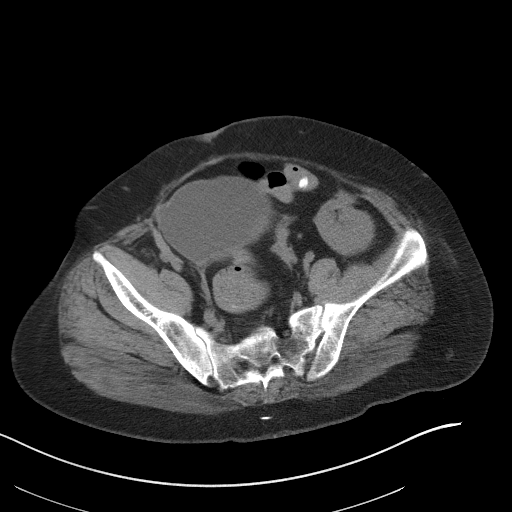
[im 38/120  soft-tissue]
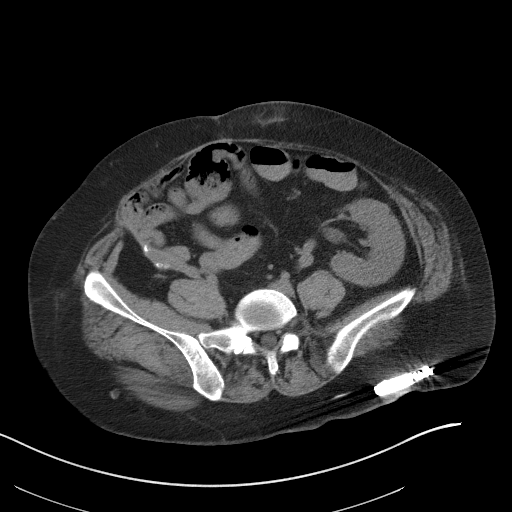
[im 53/120  soft-tissue]
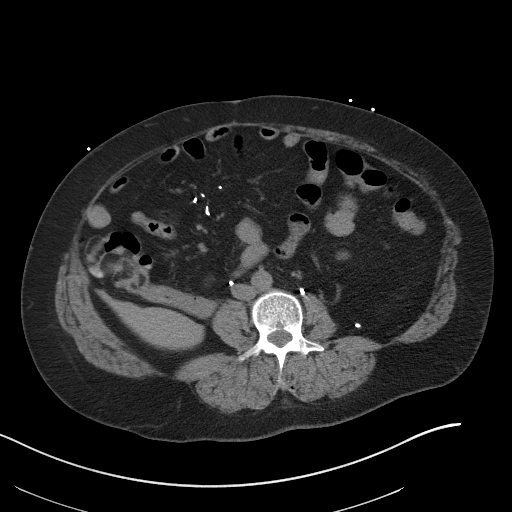
[im 60/120  soft-tissue]
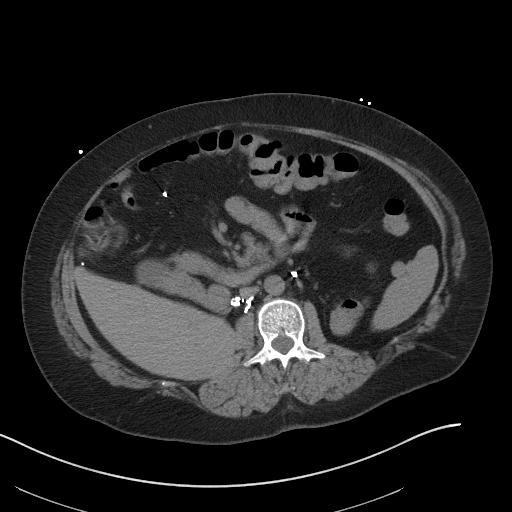
[im 67/120  soft-tissue]
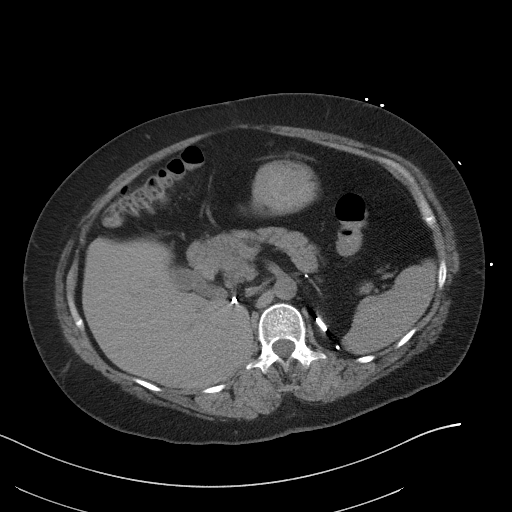
[im 82/120  soft-tissue]
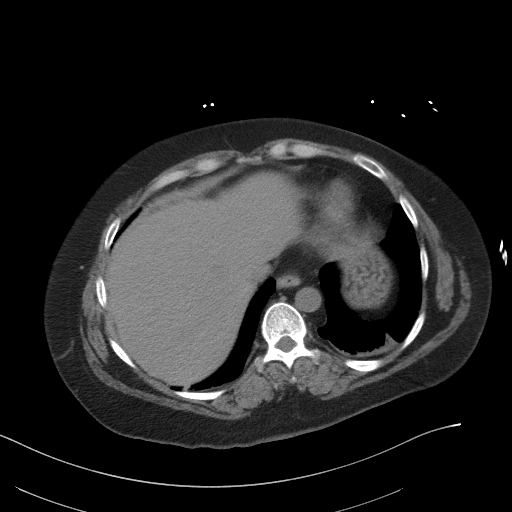
[im 90/120  soft-tissue]
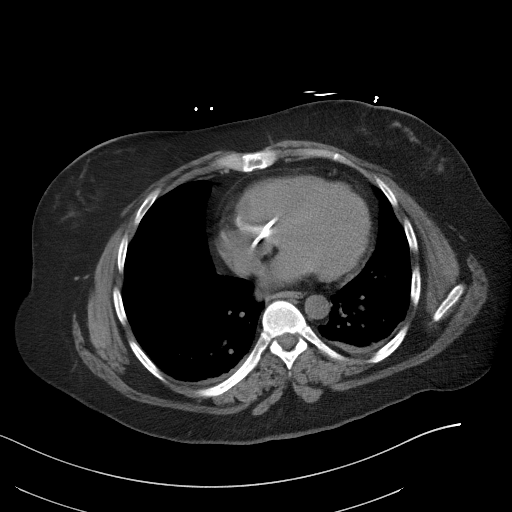
[im 90/120  bone]
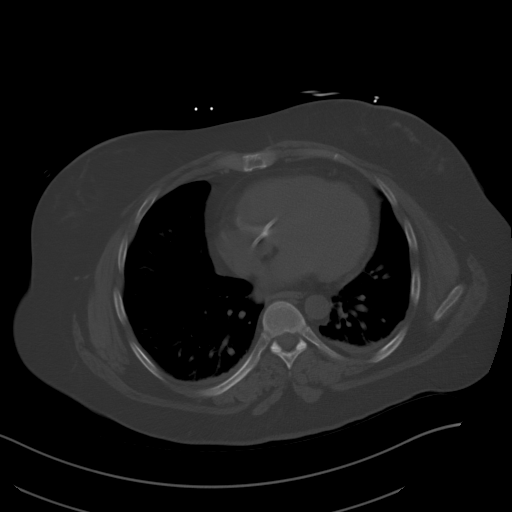
[im 97/120  soft-tissue]
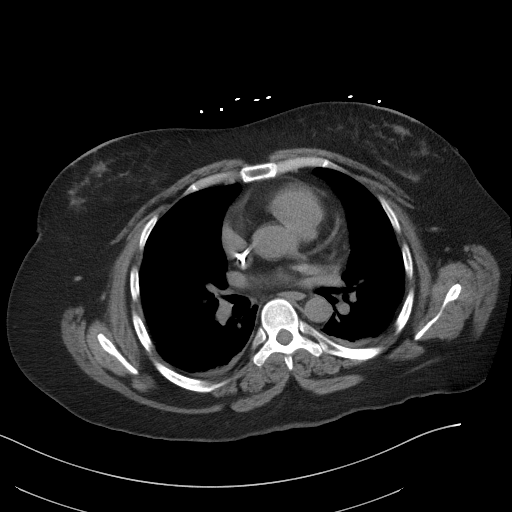
[im 112/120  soft-tissue]
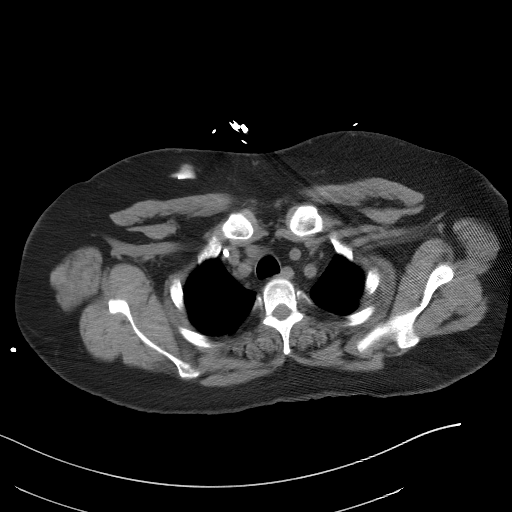

[Series 5: coronal · coronal · 0.79mm/px · 3 of 135 slices shown]
[im 45/135  soft-tissue]
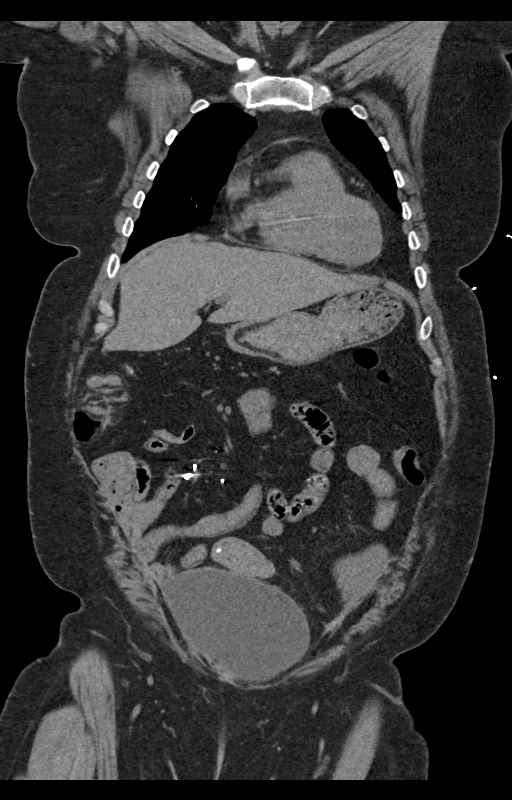
[im 60/135  soft-tissue]
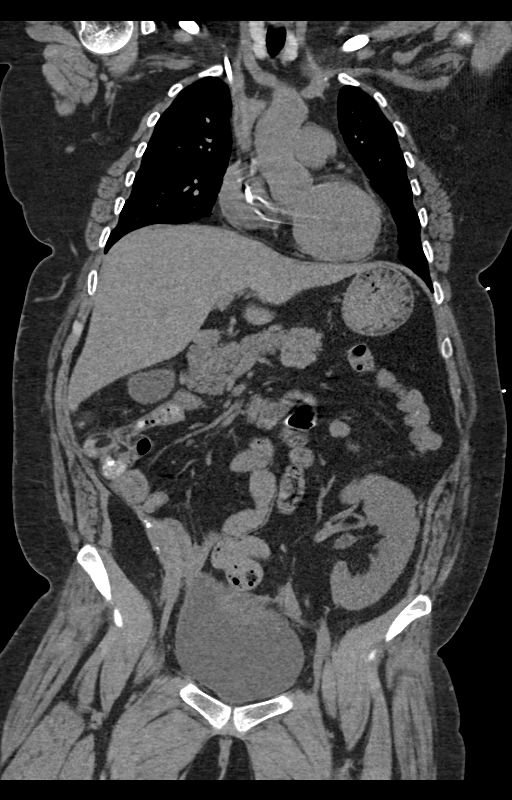
[im 75/135  soft-tissue]
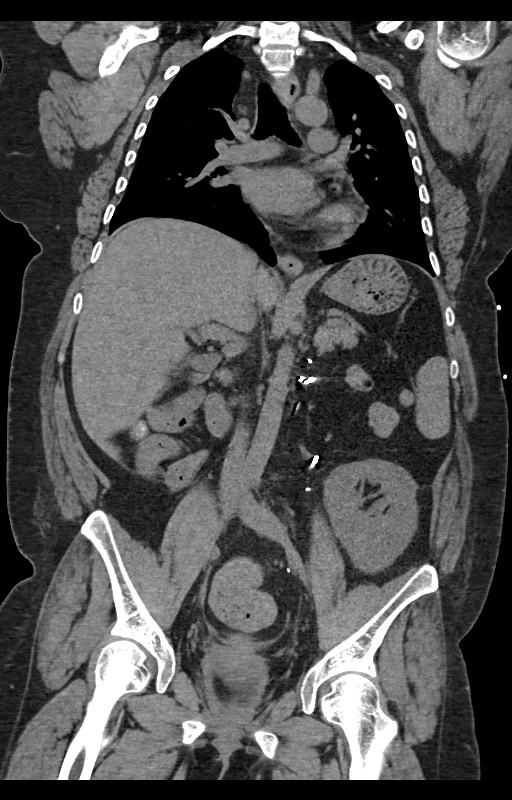

[14 of 46 positions shown; findings below may reference images not displayed]

FINDINGS: CT CHEST FINDINGS

Cardiovascular: Top normal size cardiac chambers without pericardial
effusion. No thoracic aortic aneurysm. Main pulmonary artery caliber
is top-normal at approximately 2.9 cm. Port catheter noted in the
right atrium with motion artifacts from the tip projecting into the
right ventricle.

Mediastinum/Nodes: No mediastinal adenopathy. No thyromegaly or
discrete mass. The trachea and mainstem bronchi are patent. No
esophageal abnormality by CT. Small hiatal hernia.

Lungs/Pleura: Bibasilar dependent atelectasis. Trace pleural
effusions. Mild lower lobe bronchiectasis bilaterally. No pneumonic
consolidations. No dominant mass or pneumothorax.

Musculoskeletal: Nonacute

CT ABDOMEN PELVIS FINDINGS

Hepatobiliary: No focal liver abnormality is seen. No gallstones,
gallbladder wall thickening, or biliary dilatation. Probable minimal
sludge along the dependent aspect of the gallbladder.

Pancreas: Unremarkable. No pancreatic ductal dilatation or
surrounding inflammatory changes.

Spleen: Normal in size without focal abnormality.

Adrenals/Urinary Tract: No acute abnormality of the adrenal glands.
Status post right nephrectomy with transplanted left kidney. No
obstruction of the transplanted kidney or nephrolithiasis. Neural
stimulator noted overlying the left gluteal muscle with leads
projecting into the right hemipelvis.

Stomach/Bowel: Contracted stomach. Normal small bowel rotation. No
bowel obstruction or acute inflammation. Mild submucosal fat
deposition along the right colon.

Vascular/Lymphatic: No significant vascular findings are present. No
enlarged abdominal or pelvic lymph nodes. No aortic aneurysm.

Reproductive: The uterus is unremarkable. The patient is status post
right oophorectomy. No left adnexal mass.

Other: Postoperative scarring in the right lower quadrant.
Retroperitoneal surgical clips as well as right lower quadrant
surgical clips presumably from prior nephrectomy. Subcutaneous left
lower quadrant fatty induration likely related to injection of
subcutaneous medication.

Musculoskeletal: Nonacute
IMPRESSION: 1. Trace pleural effusions.
2. Port catheter tip noted into the right atrium. Cardiac pulsations
may occasionally place tip across the tricuspid into right
ventricle.
3. Status post right nephrectomy with transplanted left kidney in
the left lower quadrant. No obstructive uropathy. Neurostimulator
device for neurogenic bladder noted.
4. Probable mild layering sludge along the dependent wall of the
gallbladder.

## 2018-11-05 ENCOUNTER — Other Ambulatory Visit: Payer: Self-pay | Admitting: *Deleted

## 2018-11-05 DIAGNOSIS — G43711 Chronic migraine without aura, intractable, with status migrainosus: Secondary | ICD-10-CM

## 2018-11-05 MED ORDER — ERENUMAB-AOOE 140 MG/ML ~~LOC~~ SOAJ: 140.0000 mg | SUBCUTANEOUS | 3 refills | Status: DC

## 2018-12-04 ENCOUNTER — Other Ambulatory Visit: Payer: Self-pay | Admitting: Neurology

## 2018-12-08 HISTORY — PX: BLADDER SURGERY: SHX569

## 2018-12-22 ENCOUNTER — Telehealth: Payer: Self-pay | Admitting: Neurology

## 2018-12-22 NOTE — Telephone Encounter (Signed)
Per Jael, this call occurred at 2:28 pm today. I advised her to let the pt's father know if she is having acute changes to proceed to ED or call 911 for an emergency.   Dr. Jaynee Eagles aware that pt's father was advised she needed to go to the ED.

## 2018-12-22 NOTE — Telephone Encounter (Signed)
Lovena Le, would you mind calling patient and letting her know I am out of the office bc of a family emergency, please.  She really needs to see her primary care as well as her kidney physician within the next day or two, or go to the Emergency Room at [facility]. She has an appointment with me Thursday but she should be seeing her other physicians or ED for blood work and a check up before that. I will see her Thursday, Thank you

## 2018-12-22 NOTE — Telephone Encounter (Signed)
Pt called again and wanted to be seen today. I informed pt that the provider had nothing available for this afternoon. Pt handed her father the phone and he stated that she is not acting like herself and that she needs to be seen. I advised the father that she had an appt on Thursday and if she can not wait till then to take the pt to the ER. He stated that the [facility] was no good. I reached out to the nurse and she also advised that if the pt has changes before the Thursday appt to take pt to a [facility].

## 2018-12-22 NOTE — Telephone Encounter (Signed)
Pt father has called stating that pt is not acting like herself and he feels it is likely due to pressure on the brain.  Pt father was told that a message would be sent to RN to ask if the appointment for 06-18 could be in office and not a virtual.  Pt father states he has Dr Cathren Laine personal cell and would call it if need be.  He was told that a message would be sent to RN in response to his request for the appointment to be in office for Thursday of this week.

## 2018-12-23 NOTE — Telephone Encounter (Signed)
After leaving voicemail for patient, I called patient's father and was able to get in touch with him to explain Dr. Cathren Laine advice. I explained that patient should have a follow-up with PCP and Kidney Care prior to Thursday's appt at [facility]. If there are changes patient should go to the ED for care/bloodwork as they can help immediately with the issues patient is experiencing. Patient's father asked if patient can keep her appointment for Thursday. I explained that Dr. Jaynee Eagles will still see patient on Thursday.

## 2018-12-23 NOTE — Telephone Encounter (Signed)
I called patient back and was sent to voicemail. I explained to her in voicemail that Dr. Jaynee Eagles is unavailable to see her until her appointment Thursday. I advised that it is very important for her to go to the ED if she is experiencing changes such as these, as they can perform blood work. I advised that she should also follow-up with her primary care and kidney physician before her follow-up Thursday with Dr. Jaynee Eagles. I advised her to call back with any questions.

## 2018-12-25 ENCOUNTER — Ambulatory Visit (HOSPITAL_COMMUNITY)
Admission: RE | Admit: 2018-12-25 | Discharge: 2018-12-25 | Disposition: A | Discharge status: Home / Self Care | Payer: Medicare Other | Source: Ambulatory Visit | Attending: Neurology | Admitting: Neurology

## 2018-12-25 ENCOUNTER — Encounter: Payer: Self-pay | Admitting: Neurology

## 2018-12-25 ENCOUNTER — Ambulatory Visit (INDEPENDENT_AMBULATORY_CARE_PROVIDER_SITE_OTHER): Payer: Medicare Other | Admitting: Neurology

## 2018-12-25 ENCOUNTER — Telehealth: Payer: Self-pay | Admitting: Neurology

## 2018-12-25 ENCOUNTER — Other Ambulatory Visit: Payer: Self-pay

## 2018-12-25 ENCOUNTER — Telehealth: Payer: Self-pay | Admitting: *Deleted

## 2018-12-25 VITALS — BP 114/80 | HR 84 | Temp 97.5°F | Ht 62.0 in | Wt 134.0 lb

## 2018-12-25 DIAGNOSIS — R404 Transient alteration of awareness: Secondary | ICD-10-CM

## 2018-12-25 DIAGNOSIS — G9340 Encephalopathy, unspecified: Secondary | ICD-10-CM | POA: Diagnosis not present

## 2018-12-25 DIAGNOSIS — R569 Unspecified convulsions: Secondary | ICD-10-CM | POA: Diagnosis not present

## 2018-12-25 NOTE — Telephone Encounter (Signed)
Medicare no auth patient is scheduled at [facility] for today 12/25/18 arrival time is 1:30 pm. Patient is aware of this.

## 2018-12-25 NOTE — Telephone Encounter (Signed)
Referral for 72 hour AMB EEG faxed to [employer] with ins card, office note, EEG. Received a receipt of confirmation.
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
## 2018-12-26 ENCOUNTER — Telehealth: Payer: Self-pay | Admitting: Neurology

## 2018-12-26 NOTE — Telephone Encounter (Signed)
Called patient, left message on her cell phone. MRI brain normal (Bethany FYI in case Kammi calls [place] back).

## 2018-12-27 IMAGING — US US ABDOMEN LIMITED
1 series · 14 of 18 positions shown · non-contrast
Comparison: None.

CLINICAL DATA: Possible abdominal wall hernia.

EXAM:
LIMITED ABDOMINAL ULTRASOUND

[Series 1: us abdomen limited · 0.11mm/px · 18 acquisitions, 14 frames shown]
[im 1/18]
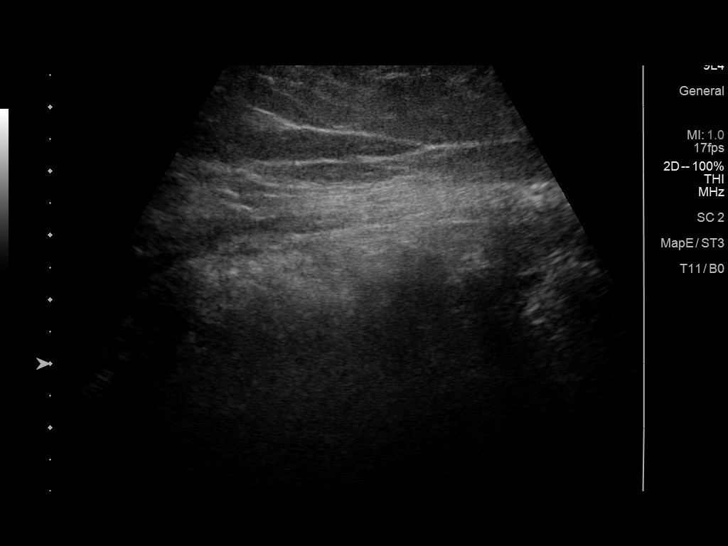
[im 2/18]
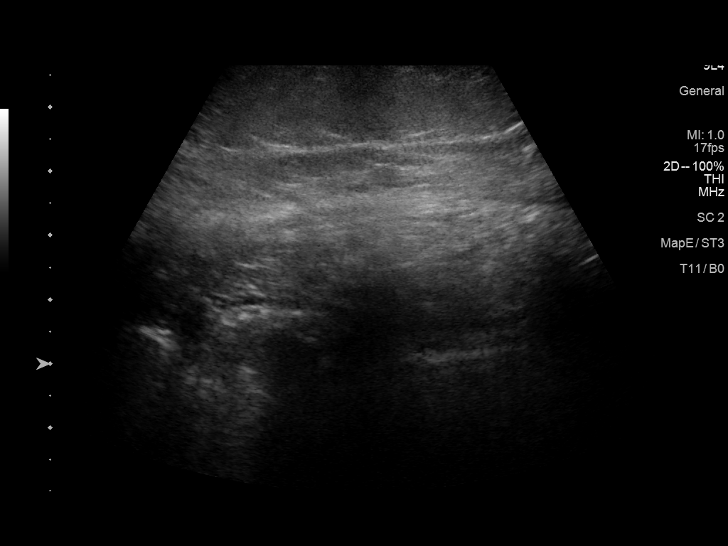
[im 4/18]
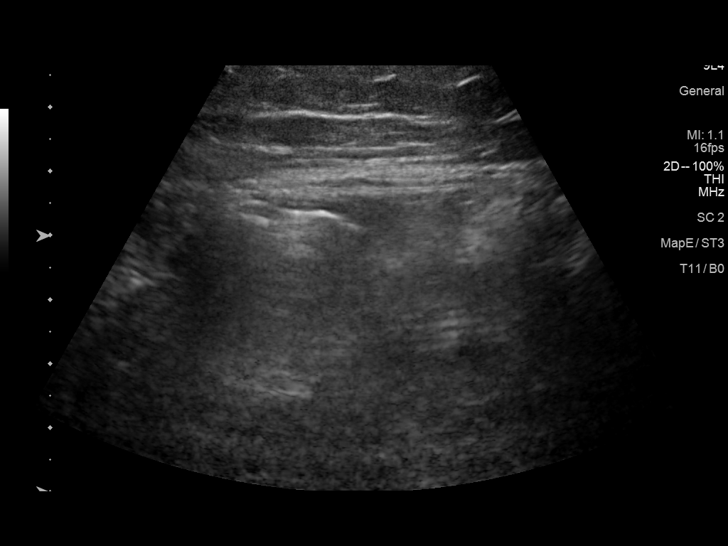
[im 5/18]
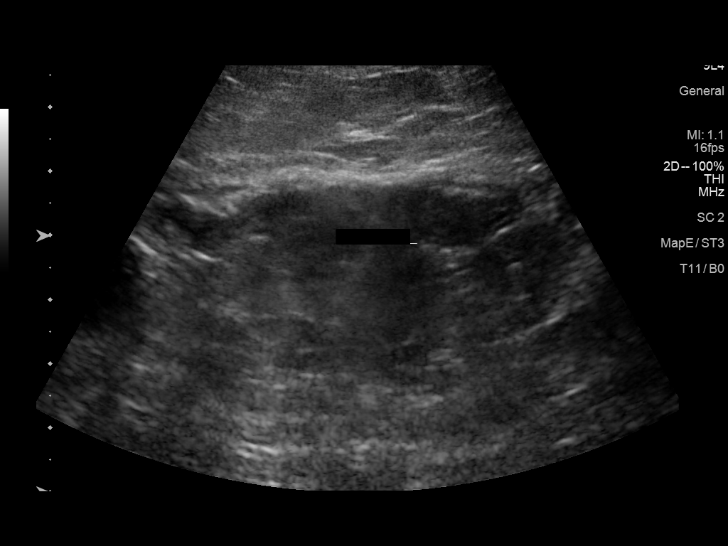
[im 6/18]
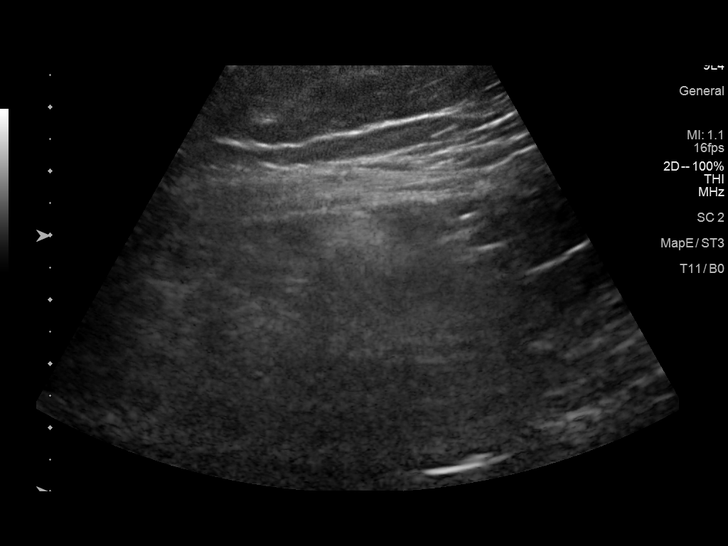
[im 8/18]
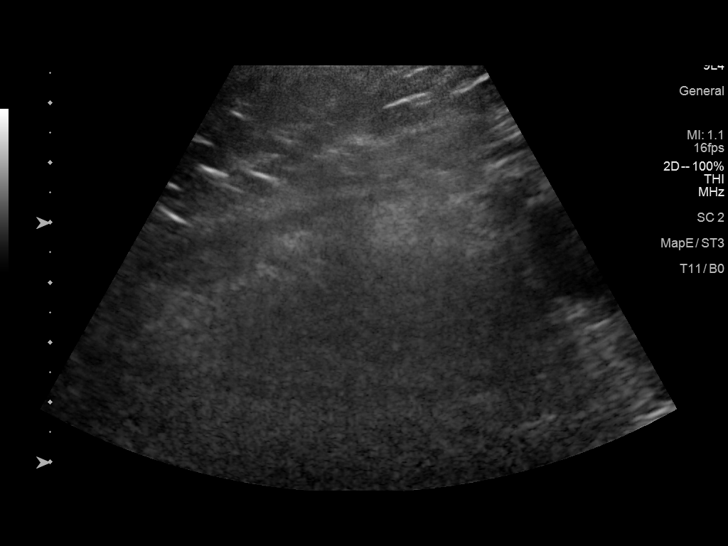
[im 9/18]
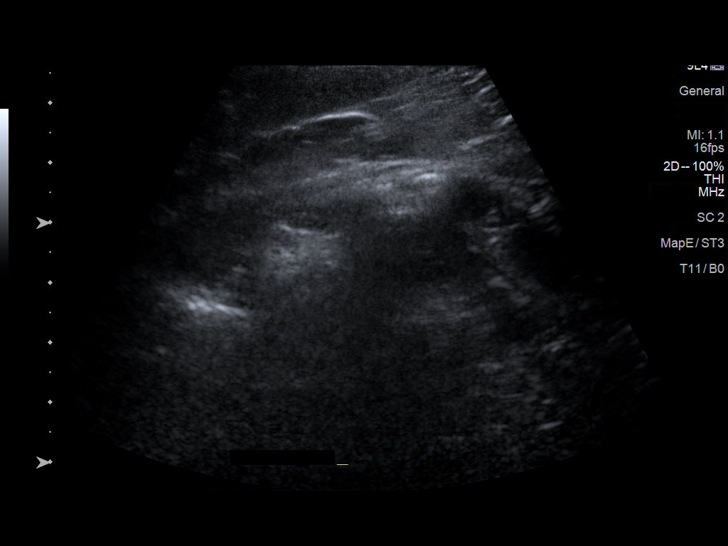
[im 10/18]
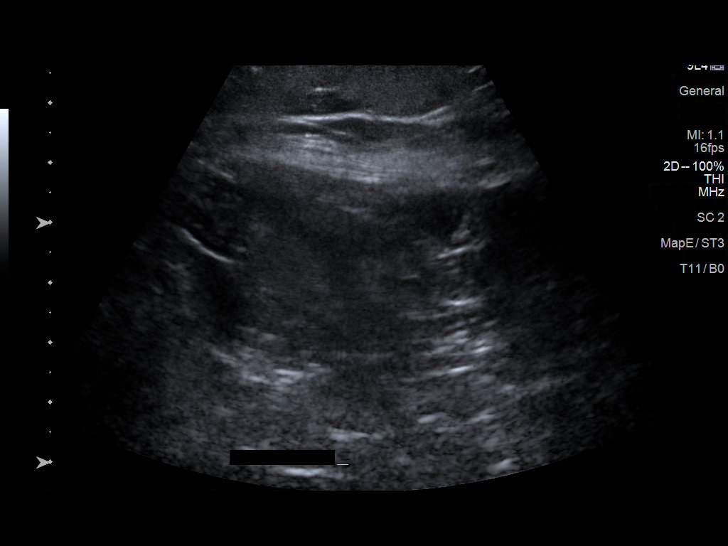
[im 11/18]
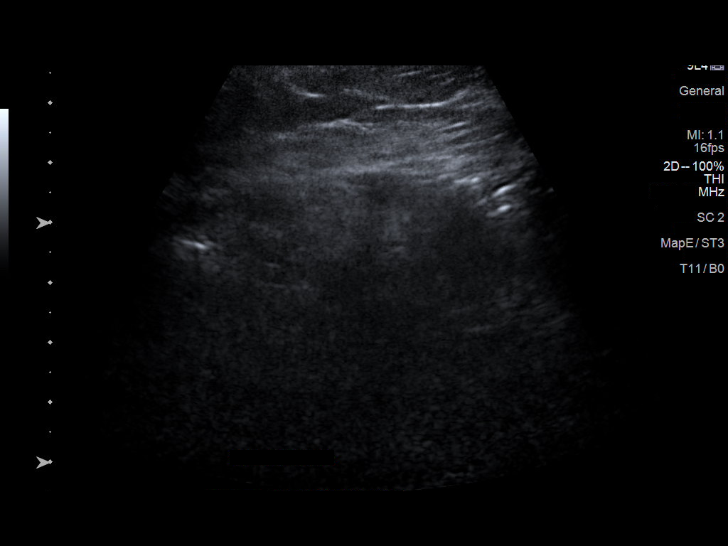
[im 13/18]
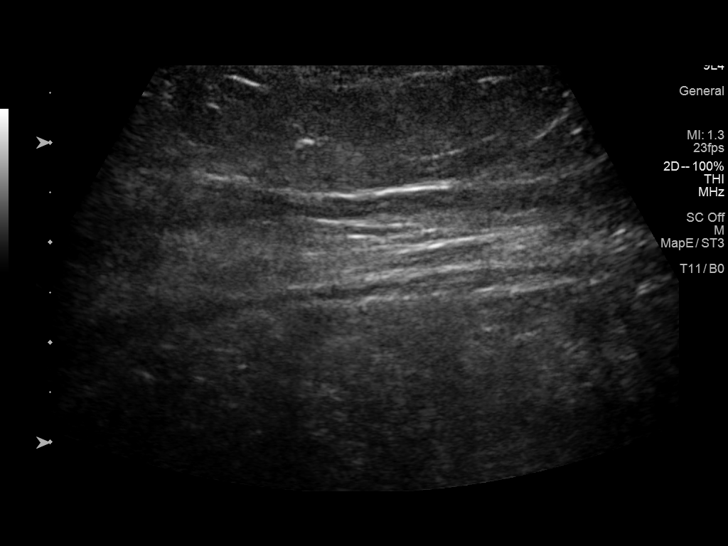
[im 14/18]
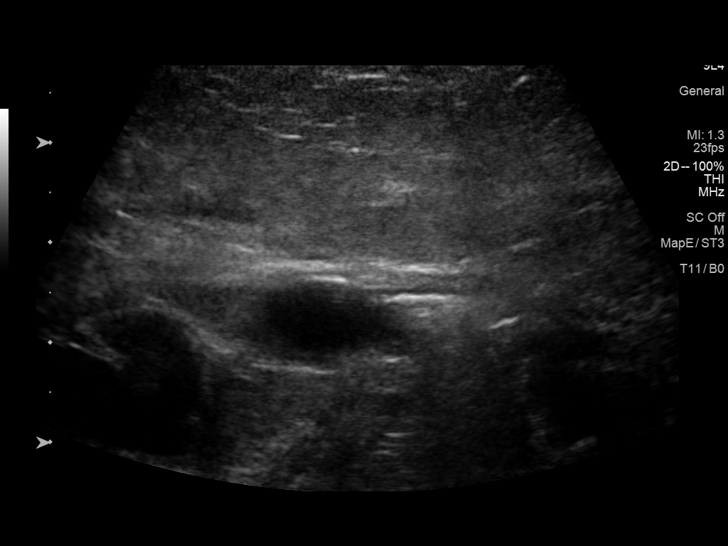
[im 15/18]
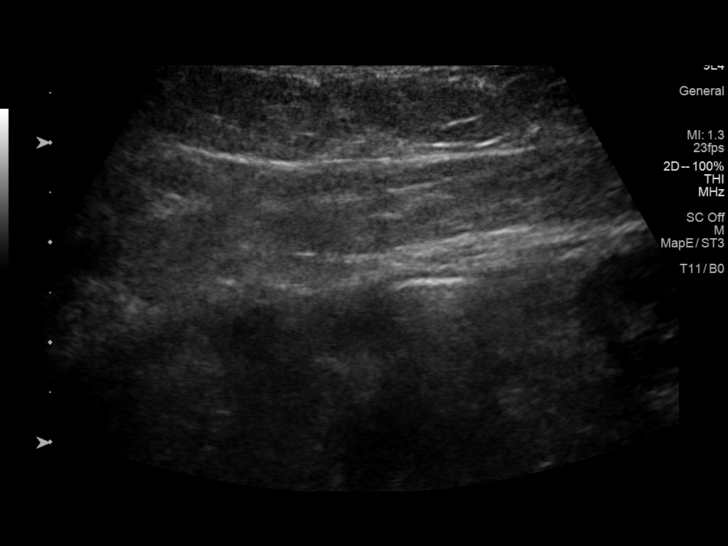
[im 17/18]
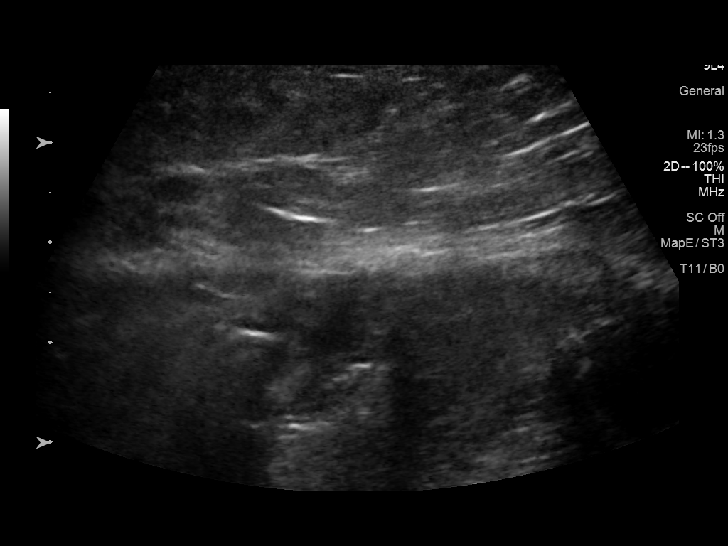
[im 18/18]
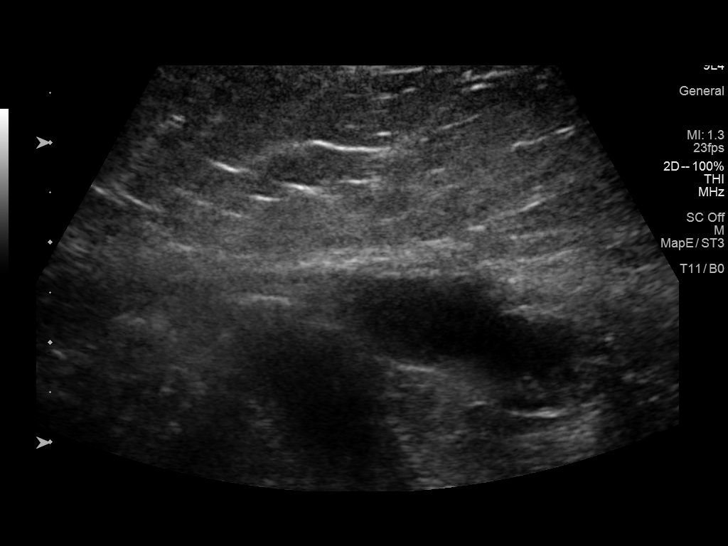

[14 of 18 positions shown; findings below may reference images not displayed]

FINDINGS: Limited sonographic evaluation was noted in area palpable concern in
the mid abdomen. No mass, fluid collection, cyst or hernia is noted.
IMPRESSION: No sonographic abnormality seen in area palpable concern in mid
abdomen.

## 2018-12-29 NOTE — Telephone Encounter (Signed)
Noted thanks °

## 2019-01-01 NOTE — Telephone Encounter (Signed)
Noted. We have not received these from [facility] yet.

## 2019-01-01 NOTE — Telephone Encounter (Signed)
Pt mother(on DPR) is asking for a call with the EEG results as soon as they are available

## 2019-01-06 ENCOUNTER — Telehealth: Payer: Self-pay | Admitting: Neurology

## 2019-01-06 NOTE — Telephone Encounter (Signed)
I called the pt's home #. Pt's father answered. They were all asleep. Will call back later.

## 2019-01-06 NOTE — Telephone Encounter (Signed)
Donna Shannon, there is no evidence for seizures. Please schedule a phone appointment if they want to follow up on this as we need to discuss next steps like further seizure testing (again, no evidence for seizures, 3-day eeg was completely normal!). I also think sending her to therapy or psychiatry for evaluation may be warranted.

## 2019-01-06 NOTE — Telephone Encounter (Signed)
The patient returned my call. Discussed ambulatory EEG results. She understands it was normal. She requested a letter from Dr. Jaynee Eagles that she can give to the lawyer that supports what MD feels was the cause of the episode she had at the beach.

## 2019-01-06 NOTE — Telephone Encounter (Signed)
Received results from [employer]. Dr. Jaynee Eagles reviewed the results and patient events. No clinical or EEG changes noted for these events.   Reading physician's conclusion/impression:  This 72 hour awake and asleep video EEG is within normal limits. The clinical features of these events are described in the push button summary above. There was no epileptiform activity associated with these events.   Impression: A normal electroencephalogram in and of itself, does not rule out the clinical diagnosis of seizure disorder. Clinical correlation recommended.

## 2019-01-06 NOTE — Telephone Encounter (Signed)
I called and spoke with patient's father. Spoke with him for aprx 15 minutes. Patient's father explained to me that what they are needing some sort of documentation to take to their lawyer for help in patient's case in reference to her recent incident at the beach. They intend to build patient's case around her extensive medical history as this event was not like patient's character. Patient's father understands and is thankful for the normal eeg and MRI results. Patient's father explained to me that the patient's first court hearing is on July 24th and they would like to have documentation of patient's medical history and physician documentation of patient's character. They are agreeable to have an appointment with Dr. Jaynee Eagles if needed. Please advise.

## 2019-01-06 NOTE — Telephone Encounter (Signed)
I called patient's father back and advised that Dr. Jaynee Eagles will write a letter on patient's behalf this weekend. Patient's father was very Patent attorney.

## 2019-01-06 NOTE — Telephone Encounter (Signed)
Pt's father called stating he would like to speak to the provider about his daughter's seizures and her condition that she is in right now. Please advise.

## 2019-01-06 NOTE — Telephone Encounter (Signed)
I will write something for her this weekend in support of patient who is absolutely lovely. Please let them know, thanks

## 2019-01-08 ENCOUNTER — Other Ambulatory Visit: Payer: Self-pay | Admitting: Neurology

## 2019-01-14 ENCOUNTER — Encounter: Payer: Self-pay | Admitting: Neurology

## 2019-01-14 ENCOUNTER — Ambulatory Visit (INDEPENDENT_AMBULATORY_CARE_PROVIDER_SITE_OTHER): Payer: Medicare Other | Admitting: Neurology

## 2019-01-14 ENCOUNTER — Other Ambulatory Visit: Payer: Self-pay

## 2019-01-14 VITALS — BP 118/70 | HR 84 | Temp 98.4°F | Wt 131.0 lb

## 2019-01-14 DIAGNOSIS — R419 Unspecified symptoms and signs involving cognitive functions and awareness: Secondary | ICD-10-CM | POA: Diagnosis not present

## 2019-01-14 MED ORDER — LAMOTRIGINE 25 MG PO TABS: ORAL_TABLET | ORAL | 6 refills | Status: DC
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
## 2019-01-14 NOTE — Patient Instructions (Addendum)
Start Lamictal  Lamotrigine tablets What is this medicine? LAMOTRIGINE (la MOE Hendricks Limes) is used to control seizures in adults and children with epilepsy and Lennox-Gastaut syndrome. It is also used in adults to treat bipolar disorder.   This medicine may be used for other purposes; ask your health care provider or pharmacist if you have questions. COMMON BRAND NAME(S): Lamictal, Subvenite What should I tell my health care provider before I take this medicine? They need to know if you have any of these conditions:  aseptic meningitis during prior use of lamotrigine  depression  folate deficiency  kidney disease  liver disease  suicidal thoughts, plans, or attempt; a previous suicide attempt by you or a family member  an unusual or allergic reaction to lamotrigine or other seizure medications, other medicines, foods, dyes, or preservatives  pregnant or trying to get pregnant  breast-feeding How should I use this medicine? Take this medicine by mouth with a glass of water. Follow the directions on the prescription label. Do not chew these tablets. If this medicine upsets your stomach, take it with food or milk. Take your doses at regular intervals. Do not take your medicine more often than directed. A special MedGuide will be given to you by the pharmacist with each new prescription and refill. Be sure to read this information carefully each time. Talk to your pediatrician regarding the use of this medicine in children. While this drug may be prescribed for children as young as 2 years for selected conditions, precautions do apply. Overdosage: If you think you have taken too much of this medicine contact a poison control center or emergency room at once. NOTE: This medicine is only for you. Do not share this medicine with others. What if I miss a dose? If you miss a dose, take it as soon as you can. If it is almost time for your next dose, take only that dose. Do not take double or  extra doses. What may interact with this medicine?  atazanavir  carbamazepine  female hormones, including contraceptive or birth control pills  lopinavir  methotrexate  phenobarbital  phenytoin  primidone  pyrimethamine  rifampin  ritonavir  trimethoprim  valproic acid This list may not describe all possible interactions. Give your health care provider a list of all the medicines, herbs, non-prescription drugs, or dietary supplements you use. Also tell them if you smoke, drink alcohol, or use illegal drugs. Some items may interact with your medicine. What should I watch for while using this medicine? Visit your doctor or health care provider for regular checks on your progress. If you take this medicine for seizures, wear a Medic Alert bracelet or necklace. Carry an identification card with information about your condition, medicines, and doctor or health care provider. It is important to take this medicine exactly as directed. When first starting treatment, your dose will need to be adjusted slowly. It may take weeks or months before your dose is stable. You should contact your doctor or health care provider if your seizures get worse or if you have any new types of seizures. Do not stop taking this medicine unless instructed by your doctor or health care provider. Stopping your medicine suddenly can increase your seizures or their severity. This medicine may cause serious skin reactions. They can happen weeks to months after starting the medicine. Contact your health care provider right away if you notice fevers or flu-like symptoms with a rash. The rash may be red or purple and then  turn into blisters or peeling of the skin. Or, you might notice a red rash with swelling of the face, lips or lymph nodes in your neck or under your arms. You may get drowsy, dizzy, or have blurred vision. Do not drive, use machinery, or do anything that needs mental alertness until you know how this  medicine affects you. To reduce dizzy or fainting spells, do not sit or stand up quickly, especially if you are an older patient. Alcohol can increase drowsiness and dizziness. Avoid alcoholic drinks. If you are taking this medicine for bipolar disorder, it is important to report any changes in your mood to your doctor or health care provider. If your condition gets worse, you get mentally depressed, feel very hyperactive or manic, have difficulty sleeping, or have thoughts of hurting yourself or committing suicide, you need to get help from your health care provider right away. If you are a caregiver for someone taking this medicine for bipolar disorder, you should also report these behavioral changes right away. The use of this medicine may increase the chance of suicidal thoughts or actions. Pay special attention to how you are responding while on this medicine. Your mouth may get dry. Chewing sugarless gum or sucking hard candy, and drinking plenty of water may help. Contact your doctor if the problem does not go away or is severe. Women who become pregnant while using this medicine may enroll in the [place] Pregnancy Registry by calling [phone]. This registry collects information about the safety of antiepileptic drug use during pregnancy. This medicine may cause a decrease in folic acid. You should make sure that you get enough folic acid while you are taking this medicine. Discuss the foods you eat and the vitamins you take with your health care provider. What side effects may I notice from receiving this medicine? Side effects that you should report to your doctor or health care professional as soon as possible:  allergic reactions like skin rash, itching or hives, swelling of the face, lips, or tongue  changes in vision  depressed mood  elevated mood, decreased need for sleep, racing thoughts, impulsive behavior  loss of balance or coordination  mouth  sores  rash, fever, and swollen lymph nodes  redness, blistering, peeling or loosening of the skin, including inside the mouth  right upper belly pain  seizures  severe muscle pain  signs and symptoms of aseptic meningitis such as stiff neck and sensitivity to light, headache, drowsiness, fever, nausea, vomiting, rash  signs of infection - fever or chills, cough, sore throat, pain or difficulty passing urine  suicidal thoughts or other mood changes  swollen lymph nodes  trouble walking  unusual bruising or bleeding  unusually weak or tired  yellowing of the eyes or skin Side effects that usually do not require medical attention (report to your doctor or health care professional if they continue or are bothersome):  diarrhea  dizziness  dry mouth  stuffy nose  tiredness  tremors  trouble sleeping This list may not describe all possible side effects. Call your doctor for medical advice about side effects. You may report side effects to FDA at 1-800-FDA-1088. Where should I keep my medicine? Keep out of reach of children. Store at room temperature between 15 and 30 degrees C (59 and 86 degrees F). Throw away any unused medicine after the expiration date. NOTE: This sheet is a summary. It may not cover all possible information. If you have questions about this  medicine, talk to your doctor, pharmacist, or health care provider.  2020 Elsevier/Gold Standard (2018-09-26 15:03:40)

## 2019-01-15 ENCOUNTER — Telehealth: Payer: Self-pay | Admitting: *Deleted

## 2019-01-15 LAB — CBC WITH DIFFERENTIAL/PLATELET
Basophils Absolute: 0 10*3/uL (ref 0.0–0.2)
Basos: 0 %
EOS (ABSOLUTE): 0.1 10*3/uL (ref 0.0–0.4)
Eos: 1 %
Hematocrit: 34.2 % (ref 34.0–46.6)
Hemoglobin: 11.1 g/dL (ref 11.1–15.9)
Immature Grans (Abs): 0 10*3/uL (ref 0.0–0.1)
Immature Granulocytes: 0 %
Lymphocytes Absolute: 1.6 10*3/uL (ref 0.7–3.1)
Lymphs: 26 %
MCH: 30.2 pg (ref 26.6–33.0)
MCHC: 32.5 g/dL (ref 31.5–35.7)
MCV: 93 fL (ref 79–97)
Monocytes Absolute: 0.5 10*3/uL (ref 0.1–0.9)
Monocytes: 8 %
Neutrophils Absolute: 3.9 10*3/uL (ref 1.4–7.0)
Neutrophils: 65 %
Platelets: 243 10*3/uL (ref 150–450)
RBC: 3.67 x10E6/uL — ABNORMAL LOW (ref 3.77–5.28)
RDW: 12.9 % (ref 11.7–15.4)
WBC: 6 10*3/uL (ref 3.4–10.8)

## 2019-01-15 LAB — COMPREHENSIVE METABOLIC PANEL
ALT: 10 IU/L (ref 0–32)
AST: 22 IU/L (ref 0–40)
Albumin/Globulin Ratio: 1.7 (ref 1.2–2.2)
Albumin: 4.6 g/dL (ref 3.8–4.8)
Alkaline Phosphatase: 41 IU/L (ref 39–117)
BUN/Creatinine Ratio: 22 (ref 9–23)
BUN: 48 mg/dL — ABNORMAL HIGH (ref 6–20)
Bilirubin Total: 0.4 mg/dL (ref 0.0–1.2)
CO2: 18 mmol/L — ABNORMAL LOW (ref 20–29)
Calcium: 9.8 mg/dL (ref 8.7–10.2)
Chloride: 108 mmol/L — ABNORMAL HIGH (ref 96–106)
Creatinine, Ser: 2.23 mg/dL — ABNORMAL HIGH (ref 0.57–1.00)
GFR calc Af Amer: 32 mL/min/{1.73_m2} — ABNORMAL LOW (ref >59)
GFR calc non Af Amer: 28 mL/min/{1.73_m2} — ABNORMAL LOW (ref >59)
Globulin, Total: 2.7 g/dL (ref 1.5–4.5)
Glucose: 82 mg/dL (ref 65–99)
Potassium: 5.1 mmol/L (ref 3.5–5.2)
Sodium: 142 mmol/L (ref 134–144)
Total Protein: 7.3 g/dL (ref 6.0–8.5)

## 2019-01-15 NOTE — Telephone Encounter (Signed)
Spoke with pt. She is aware creatinine 2.23. She said she called Denice Paradise Hughes's office and LVM asking for call back.

## 2019-01-15 NOTE — Telephone Encounter (Signed)
-----   Message from Melvenia Beam, MD sent at 01/15/2019 11:38 AM EDT ----- Creatinine 2.23 let her know thanks

## 2019-01-19 ENCOUNTER — Other Ambulatory Visit: Payer: Self-pay

## 2019-01-20 ENCOUNTER — Other Ambulatory Visit: Payer: Self-pay

## 2019-01-20 ENCOUNTER — Encounter: Payer: Self-pay | Admitting: *Deleted

## 2019-01-20 ENCOUNTER — Encounter: Payer: Self-pay | Admitting: Neurology

## 2019-02-11 ENCOUNTER — Ambulatory Visit: Payer: Medicare Other | Admitting: Neurology

## 2019-02-16 ENCOUNTER — Encounter: Payer: Self-pay | Admitting: Neurology

## 2019-02-16 ENCOUNTER — Other Ambulatory Visit: Payer: Self-pay

## 2019-02-16 ENCOUNTER — Ambulatory Visit (INDEPENDENT_AMBULATORY_CARE_PROVIDER_SITE_OTHER): Payer: Medicare Other | Admitting: Neurology

## 2019-02-16 VITALS — BP 110/80 | HR 108 | Temp 98.7°F | Ht 62.0 in | Wt 137.0 lb

## 2019-02-16 DIAGNOSIS — Z79899 Other long term (current) drug therapy: Secondary | ICD-10-CM | POA: Diagnosis not present

## 2019-02-16 DIAGNOSIS — R419 Unspecified symptoms and signs involving cognitive functions and awareness: Secondary | ICD-10-CM | POA: Diagnosis not present

## 2019-02-16 DIAGNOSIS — G43711 Chronic migraine without aura, intractable, with status migrainosus: Secondary | ICD-10-CM | POA: Diagnosis not present

## 2019-02-16 MED ORDER — NURTEC 75 MG PO TBDP: 75.0000 mg | ORAL_TABLET | Freq: Every day | ORAL | 6 refills | Status: DC | PRN

## 2019-02-16 NOTE — Progress Notes (Signed)
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 fioricet, flexeril, Keppra, zofran, zoloft, compazine, paxil, metoprolol  Discussed: Per [facility] [place] DM[place] statutes, patients with seizures are not allowed to drive until they have been seizure-free for six months.    Use caution when using heavy equipment or power tools. Avoid working on ladders or at heights. Take showers instead of baths. Ensure the water temperature is not too high on the home water heater. Do not go swimming alone. Do not lock yourself in a room alone (i.e. bathroom). When caring for infants or small children, sit down when holding, feeding, or changing them to minimize risk of injury to the child in the event you have a seizure. Maintain good sleep hygiene. Avoid alcohol.    If patient has another seizure, call 911 and bring them back to the ED if: A.  The  seizure lasts longer than 5 minutes.      B.  The patient doesn't wake shortly after the seizure or has new problems such as difficulty seeing, speaking or moving following the seizure C.  The patient was injured during the seizure D.  The patient has a temperature over 102 F (39C) E.  The patient vomited during the seizure and now is having trouble breathing  Per [facility] statutes, patients with seizures are not allowed to drive until they have been seizure-free for six months.  Other recommendations include using caution when using heavy equipment or power tools. Avoid working on ladders or at heights. Take showers instead of baths.  Do not swim alone.  Ensure the water temperature is not too high on the home water heater. Do not go swimming alone. Do not lock yourself in a room alone (i.e. bathroom). When caring for infants or small children, sit down when holding, feeding, or changing them to  minimize risk of injury to the child in the event you have a seizure. Maintain good sleep hygiene. Avoid alcohol.  Also recommend adequate sleep, hydration, good diet and minimize stress.  During the Seizure  - First, ensure adequate ventilation and place patients on the floor on their left side  Loosen clothing around the neck and ensure the airway is patent. If the patient is clenching the teeth, do not force the mouth open with any object as this can cause severe damage - Remove all items from the surrounding that can be hazardous. The patient may be oblivious to what's happening and may not even know what he or she is doing. If the patient is confused and wandering, either gently guide him/her away and block access to outside areas - Reassure the individual and be comforting - Call 911. In most cases, the seizure ends before EMS arrives. However, there are cases when seizures may last over 3 to 5 minutes. Or the individual may have developed breathing difficulties or severe injuries. If a  pregnant patient or a person with diabetes develops a seizure, it is prudent to call an ambulance. - Finally, if the patient does not regain full consciousness, then call EMS. Most patients will remain confused for about 45 to 90 minutes after a seizure, so you must use judgment in calling for help. - Avoid restraints but make sure the patient is in a bed with padded side rails - Place the individual in a lateral position with the neck slightly flexed; this will help the saliva drain from the mouth and prevent the tongue from falling backward - Remove all nearby furniture and other hazards from the area - Provide verbal assurance as the individual is regaining consciousness - Provide the patient with privacy if possible - Call for help and start treatment as ordered by the caregiver   fter the Seizure (Postictal Stage)  After a seizure, most patients experience confusion, fatigue, muscle pain and/or a headache. Thus, one should permit the individual to sleep. For the next few days, reassurance is essential. Being calm and helping reorient the person is also of importance.  Most seizures are painless and end spontaneously. Seizures are not harmful to others but can lead to complications such as stress on the lungs, brain and the heart. Individuals with prior lung problems may develop labored breathing and respiratory distress.    PRIOR:   - Patient has a long history of migraines.  - She has tried multiple medications.  - She eats well, she exercises regularly, she is at a normal healthy weight.  - There is no medication overuse. There is no aura. - Migraines are unilateral and spread to the whole head, often behind the eyes, pulsating and throbbing, +light and sound sensitivity, +nausea and vomiting - Are moderately severe to severe enough to need to go for an infusion. - She has baseline 15 migraine days a month for more than the last 6 months. They can last up to 24 hours and be severe -  Movement makes it worse - She has recently lost weight and has no signs or symptoms of pseudotumor cerebri.   Naomie DeanAntonia Santos Hardwick, MD  [facility] [address] [place], [place] [id number]  Phone [phone] Fax [phone]  A total of 25 minutes was spent face-to-face with this patient. Over half this time was spent on counseling patient on the alteration of awareness  diagnosis and different diagnostic and therapeutic options available. This does not include  time spent on botox procedure.

## 2019-02-16 NOTE — Progress Notes (Signed)
Botox- 200 units x 1 vial Lot: [id number] Expiration: 09/2021 NDC: 0023-3921-02  Bacteriostatic 0.9% Sodium Chloride- 4mL total Lot: [id number] Expiration: 04/09/2019 NDC: 0409-1966-02  Dx: G43.711 B/B   

## 2019-02-17 LAB — LAMOTRIGINE LEVEL: Lamotrigine Lvl: 2 ug/mL (ref 2.0–20.0)

## 2019-03-01 ENCOUNTER — Other Ambulatory Visit: Payer: Self-pay | Admitting: Neurology

## 2019-03-01 MED ORDER — PROMETHAZINE HCL 25 MG PO TABS: ORAL_TABLET | ORAL | 11 refills | Status: DC

## 2019-03-02 ENCOUNTER — Other Ambulatory Visit: Payer: Self-pay | Admitting: Neurology

## 2019-03-12 ENCOUNTER — Telehealth: Payer: Self-pay | Admitting: *Deleted

## 2019-03-12 NOTE — Telephone Encounter (Signed)
Per [facility], Aimovig approved 12/12/2018-03/11/2020. I faxed a copy of approval to pt's pharmacy. Received a receipt of confirmation.

## 2019-03-12 NOTE — Telephone Encounter (Signed)
Continuation PA for Aimovig completed on CMM. Key: [id number]. Awaiting [facility] determination.

## 2019-05-07 ENCOUNTER — Other Ambulatory Visit: Payer: Self-pay | Admitting: Neurology

## 2019-06-15 ENCOUNTER — Other Ambulatory Visit: Payer: Self-pay | Admitting: Neurology

## 2019-07-06 ENCOUNTER — Telehealth: Payer: Self-pay | Admitting: Neurology

## 2019-07-06 NOTE — Telephone Encounter (Signed)
Patient called to advise that she is getting her words jumbled up and can barely text/write. Patient also states there are times when she is confused. Please follow up.

## 2019-07-06 NOTE — Telephone Encounter (Signed)
Called patient and advised her of Dr Cathren Laine advice. She stated she's on antibiotic from [facility] for a respiratory infection. I advised she can call [facility] to discuss her symptoms or go to ED. She  verbalized agreement, understanding, appreciation.

## 2019-07-06 NOTE — Telephone Encounter (Signed)
She should go to the emergency room, I have no openings this week and this may or may not be neurologic. She should be seen by primary care or go to the ED.

## 2019-07-08 ENCOUNTER — Telehealth: Payer: Self-pay | Admitting: *Deleted

## 2019-07-08 ENCOUNTER — Encounter: Payer: Self-pay | Admitting: *Deleted

## 2019-07-08 NOTE — Telephone Encounter (Signed)
We received a refill request from [facility] for Lamotrigine 25 mg tablet stating pt told them she takes 3 tablets in the AM and 3 tablets in the PM. Pt was previously given a titration order through mychart with goal of 100 mg twice daily. Will clarify with pt if she is continuing to titrate the  Medication or if this is the dose she has been staying at

## 2019-07-13 ENCOUNTER — Other Ambulatory Visit: Payer: Self-pay | Admitting: Neurology

## 2019-07-13 MED ORDER — LAMOTRIGINE 25 MG PO TABS: 75.0000 mg | ORAL_TABLET | Freq: Two times a day (BID) | ORAL | 3 refills | Status: DC

## 2019-07-13 NOTE — Telephone Encounter (Signed)
That is fine, I sent in a new prescription please let her know thanks

## 2019-07-13 NOTE — Telephone Encounter (Signed)
I spoke with the patient and let her know the Lamotrigine refill had been sent with instructions as 75 mg BID. Pt verbalized appreciation and understanding. She also stated she has been trying to login to mychart to check messages. I advised she contact the help desk if she is unable to get in. She verbalized appreciation.

## 2019-07-13 NOTE — Telephone Encounter (Signed)
I spoke with the Donna Shannon. Discussed Lamictal and the most recent titration schedule she was given in August. She stated she is taking Lamictal 75 mg twice daily and states she hasn't increased it in a long time. She states she is doing fine on the current dose. She does need a refill. I told her I would discuss with Dr. Lucia Gaskins and then we would send it in.

## 2019-08-11 ENCOUNTER — Telehealth: Payer: Self-pay

## 2019-08-11 ENCOUNTER — Encounter: Payer: Self-pay | Admitting: *Deleted

## 2019-08-11 MED ORDER — LAMOTRIGINE 25 MG PO TABS: 100.0000 mg | ORAL_TABLET | Freq: Two times a day (BID) | ORAL | 1 refills | Status: DC

## 2019-08-11 NOTE — Addendum Note (Signed)
Addended by: Bertram Savin on: 08/11/2019 05:52 PM   Modules accepted: Orders

## 2019-08-11 NOTE — Telephone Encounter (Signed)
Order sent to pharmacy to reflect increase in lamotrigine dose to 100 mg (4 tablets) twice daily and mychart message sent to pt.

## 2019-08-11 NOTE — Telephone Encounter (Signed)
lamoTRIgine (LAMICTAL) 25 MG tablet Patient would like to increase the dose from 75mg  to 100mg     CB#[phone]

## 2019-08-11 NOTE — Telephone Encounter (Signed)
Spoke with pt. She said there are no problems. She just feels ready to increase dose to 100 mg twice daily. She said a couple of weeks ago she ran out d/t the pharmacy having to order the medication and she got confused. I advised I would send a message to Dr. Lucia Gaskins to see if ok to increase dose. Pt has f/u on 09/01/19 @ 11:00. Pt verbalized appreciation for the call.

## 2019-08-11 NOTE — Telephone Encounter (Signed)
That's fine, that's where I really wanted her dosing initially so I support that. thanks

## 2019-08-14 ENCOUNTER — Other Ambulatory Visit: Payer: Self-pay | Admitting: Neurology

## 2019-08-31 HISTORY — PX: OTHER SURGICAL HISTORY: SHX169

## 2019-09-01 ENCOUNTER — Ambulatory Visit: Payer: Medicare Other | Admitting: Neurology

## 2019-09-04 DIAGNOSIS — G43711 Chronic migraine without aura, intractable, with status migrainosus: Secondary | ICD-10-CM

## 2019-09-07 MED ORDER — NURTEC 75 MG PO TBDP: 75.0000 mg | ORAL_TABLET | Freq: Every day | ORAL | 3 refills | Status: DC | PRN

## 2019-09-10 ENCOUNTER — Telehealth: Payer: Self-pay | Admitting: *Deleted

## 2019-09-10 NOTE — Telephone Encounter (Signed)
Nurtec PA completed on [employer]. Key: [id number]. Awaiting determination.

## 2019-09-10 NOTE — Telephone Encounter (Signed)
Per [employer], Nurtec approved 07/10/2019 - 09/09/2020. I will pt's pharmacy to let them know.

## 2019-09-10 NOTE — Telephone Encounter (Addendum)
Spoke with [employer] @ [facility]. Nurtec processed successfully for $3.06. They will order the medication and it will be there tomorrow. Updated pt in mychart.

## 2019-09-28 ENCOUNTER — Other Ambulatory Visit: Payer: Self-pay | Admitting: Neurology

## 2019-10-05 ENCOUNTER — Telehealth: Payer: Self-pay | Admitting: *Deleted

## 2019-10-05 NOTE — Telephone Encounter (Signed)
Received standing infusion order renewal request for migraine/severe headache once weekly PRN. Orders signed by Dr. Lucia Gaskins and returned via fax. Received a receipt of confirmation.  Patient to receive the following medications in order via IV as indicated: 1. Phenergan 25 mg IVP over 2 minutes (prn) 2. Toradol 30 mg IVP over 2 minutes (prn) 3. NS 5 mL IVP over 2 minutes 4. Decadron 10 mg IVP over 2 minutes (prn) 5. NS 5 mL IVP over 2 minutes 6. Mag Sulfate 1G in 500 mL NS via IV pump over 30 minutes 7. NS 5 mL IVP over 2 minutes

## 2019-10-06 NOTE — Telephone Encounter (Signed)
Received report from M. Therapist, music at [facility] in [place], [place]. Pt received infusion on 10/05/19 for 8/10 migraine headache pain. She received the migraine regimen (with exception of DHE which is on backorder) as ordered. Pt tolerated well and pain decreased to 5/10 post-infusion.

## 2019-10-13 NOTE — Telephone Encounter (Signed)
Received fax from [facility] stating pt was given a temporary supply of Aimovig and that it is subject to a quantity limit of 1 pen per 30 days. Pt had previously been ordered a 90 day supply of 3 pens. Per this notice, it appears pt may only be able to fill in 30 day increments.

## 2019-10-13 NOTE — Progress Notes (Signed)
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  She is on Aimovig and was getting Botox for her migraines.  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  Reviewed orally with patient, additionally signature is on file:  Botulism toxin has been approved by the Federal drug administration for treatment of chronic migraine. Botulism toxin does not cure chronic migraine and it may not be effective in some patients.  The administration of botulism toxin is accomplished by injecting a small amount of toxin into the muscles of the neck and head. Dosage must be titrated for each individual. Any benefits resulting from botulism toxin tend to wear off after 3 months with a repeat injection required if benefit is to be maintained. Injections are usually done every 3-4 months with maximum effect peak achieved by about 2 or 3 weeks. Botulism toxin is expensive and you should be sure of what costs you will incur resulting from the injection.  The side effects of botulism toxin use for chronic migraine may include:   -Transient, and usually mild, facial weakness with facial injections  -Transient, and usually mild, head or neck weakness with head/neck injections  -Reduction or loss of forehead facial animation due to forehead muscle weakness  -Eyelid drooping  -Dry eye  -Pain at the site of injection or bruising at the site of  injection  -Double vision  -Potential unknown long term risks  Contraindications: You should not have Botox if you are pregnant, nursing, allergic to albumin, have an infection, skin condition, or muscle weakness at the site of the injection, or have myasthenia gravis, Lambert-Eaton syndrome, or ALS.  It is also possible that as with any injection, there may be an allergic reaction or no effect from the medication. Reduced effectiveness after repeated injections is sometimes seen and rarely infection at the injection site may occur. All care will be taken to prevent these side effects. If therapy is given over a long time, atrophy and wasting in the muscle injected may occur. Occasionally the patient's become refractory to treatment because they develop antibodies to the toxin. In this event, therapy needs to be modified.  I have read the above information and consent to the administration of botulism toxin.    BOTOX PROCEDURE NOTE FOR MIGRAINE HEADACHE    Contraindications and precautions discussed with patient(above). Aseptic procedure was observed and patient tolerated procedure. Procedure performed by Dr. Artemio Aly  The condition has existed for more than 6 months, and pt does not have a diagnosis of ALS, Myasthenia Gravis or Lambert-Eaton Syndrome.  Risks and benefits of injections discussed and pt agrees to proceed with the procedure.  Written consent obtained  These injections are medically necessary. Pt  receives good benefits from these injections. These injections do not cause sedations or hallucinations which the oral therapies may cause.  Description of procedure:  The patient was placed in a sitting position. The standard protocol was used for Botox as follows, with 5 units of Botox injected at each site:   -Procerus muscle, midline injection  -Corrugator muscle, bilateral injection  -  Frontalis muscle, bilateral injection, with 2 sites each side, medial injection was  performed in the upper one third of the frontalis muscle, in the region vertical from the medial inferior edge of the superior orbital rim. The lateral injection was again in the upper one third of the forehead vertically above the lateral limbus of the cornea, 1.5 cm lateral to the medial injection site.  -Temporalis muscle injection, 4 sites, bilaterally. The first injection was 3 cm above the tragus of the ear, second injection site was 1.5 cm to 3 cm up from the first injection site in line with the tragus of the ear. The third injection site was 1.5-3 cm forward between the first 2 injection sites. The fourth injection site was 1.5 cm posterior to the second injection site.   -Occipitalis muscle injection, 3 sites, bilaterally. The first injection was done one half way between the occipital protuberance and the tip of the mastoid process behind the ear. The second injection site was done lateral and superior to the first, 1 fingerbreadth from the first injection. The third injection site was 1 fingerbreadth superiorly and medially from the first injection site.  -Cervical paraspinal muscle injection, 2 sites, bilateral knee first injection site was 1 cm from the midline of the cervical spine, 3 cm inferior to the lower border of the occipital protuberance. The second injection site was 1.5 cm superiorly and laterally to the first injection site.  -Trapezius muscle injection was performed at 3 sites, bilaterally. The first injection site was in the upper trapezius muscle halfway between the inflection point of the neck, and the acromion. The second injection site was one half way between the acromion and the first injection site. The third injection was done between the first injection site and the inflection point of the neck.   Will return for repeat injection in 3 months.   200 units of Botox was used, any Botox not injected was wasted. The patient tolerated the procedure well, there were no  complications of the above procedure.

## 2019-10-14 ENCOUNTER — Ambulatory Visit (INDEPENDENT_AMBULATORY_CARE_PROVIDER_SITE_OTHER): Payer: Medicare Other | Admitting: Neurology

## 2019-10-14 ENCOUNTER — Other Ambulatory Visit: Payer: Self-pay

## 2019-10-14 ENCOUNTER — Encounter: Payer: Self-pay | Admitting: Neurology

## 2019-10-14 VITALS — BP 112/84 | HR 88 | Temp 98.5°F | Ht 61.0 in | Wt 148.0 lb

## 2019-10-14 DIAGNOSIS — G43711 Chronic migraine without aura, intractable, with status migrainosus: Secondary | ICD-10-CM

## 2019-10-14 NOTE — Progress Notes (Signed)
Botox- 200 units x 1 vial Lot: [id number] Expiration: 06/2022 NDC: 0023-3921-02  Bacteriostatic 0.9% Sodium Chloride- 4mL total Lot: [id number] Expiration: 01/07/2020 NDC: 0409-1966-02  Dx: G43.711 B/B   

## 2019-10-19 ENCOUNTER — Other Ambulatory Visit: Payer: Self-pay | Admitting: Neurology

## 2019-10-29 NOTE — Telephone Encounter (Addendum)
Received report from K. Scientist, water quality at [facility] in [place], [place]. Pt received infusion on 10/29/19 for 8/10 migraine headache pain. She received the migraine regimen (with exception of DHE which is on backorder) as ordered. Pt tolerated well and pain decreased to 4/10 post-infusion.   Dr. Lucia Gaskins aware.

## 2019-11-04 ENCOUNTER — Other Ambulatory Visit: Payer: Self-pay | Admitting: Neurology

## 2019-11-04 DIAGNOSIS — Z79899 Other long term (current) drug therapy: Secondary | ICD-10-CM

## 2019-11-04 MED ORDER — LAMOTRIGINE 100 MG PO TABS: 100.0000 mg | ORAL_TABLET | Freq: Two times a day (BID) | ORAL | 6 refills | Status: DC

## 2019-11-04 NOTE — Progress Notes (Signed)
Acetazolamide leve

## 2019-11-04 NOTE — Progress Notes (Signed)
lamotri

## 2019-11-09 ENCOUNTER — Other Ambulatory Visit: Payer: Self-pay

## 2019-11-11 ENCOUNTER — Other Ambulatory Visit: Payer: Self-pay

## 2019-11-17 ENCOUNTER — Other Ambulatory Visit: Payer: Self-pay | Admitting: Neurology

## 2019-11-20 ENCOUNTER — Other Ambulatory Visit: Payer: Self-pay | Admitting: Neurology

## 2019-11-20 DIAGNOSIS — G43711 Chronic migraine without aura, intractable, with status migrainosus: Secondary | ICD-10-CM

## 2019-11-23 ENCOUNTER — Telehealth: Payer: Self-pay | Admitting: Neurology

## 2019-11-23 NOTE — Telephone Encounter (Signed)
Pt called stating that she has been having dizziness for the past 5 days and is wanting to know if her spinal pressure can be checked either here or at the radiology center in [place], [place] Please advise.

## 2019-11-24 NOTE — Telephone Encounter (Signed)
Donna Shannon, I have emailed her about this, she has to see her primary care first for these symptoms. Would you call and tell her that again? Her symptoms may not be neurologic, she has to see her pcp first. Toma Copier fyi) thanks

## 2019-11-25 NOTE — Telephone Encounter (Signed)
Called pt and LVM informing her to reach out to her PCP.

## 2019-11-26 NOTE — Telephone Encounter (Signed)
Received report from Grier Rocher RN at [facility] in [place], [place]. Pt received infusion on 11/26/19 for 7/10 migraine headache pain. She received the migraine regimen (with exception of DHE which is on backorder) as ordered. Pt tolerated well and pain decreased to 3/10 post-infusion.

## 2019-11-30 ENCOUNTER — Other Ambulatory Visit: Payer: Self-pay

## 2019-11-30 MED ORDER — PROMETHAZINE HCL 25 MG PO TABS: ORAL_TABLET | ORAL | 0 refills | Status: DC

## 2019-12-30 ENCOUNTER — Other Ambulatory Visit: Payer: Self-pay | Admitting: Neurology

## 2019-12-30 ENCOUNTER — Other Ambulatory Visit: Payer: Self-pay

## 2019-12-30 DIAGNOSIS — G43711 Chronic migraine without aura, intractable, with status migrainosus: Secondary | ICD-10-CM

## 2019-12-30 MED ORDER — PROMETHAZINE HCL 25 MG PO TABS: 25.0000 mg | ORAL_TABLET | Freq: Four times a day (QID) | ORAL | 2 refills | Status: DC | PRN

## 2019-12-30 MED ORDER — AIMOVIG 140 MG/ML ~~LOC~~ SOAJ: 140.0000 mg | SUBCUTANEOUS | 4 refills | Status: DC

## 2020-01-13 NOTE — Progress Notes (Deleted)
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  She is on Aimovig and was getting Botox for her migraines.  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  Reviewed orally with patient, additionally signature is on file:  Botulism toxin has been approved by the Federal drug administration for treatment of chronic migraine. Botulism toxin does not cure chronic migraine and it may not be effective in some patients.  The administration of botulism toxin is accomplished by injecting a small amount of toxin into the muscles of the neck and head. Dosage must be titrated for each individual. Any benefits resulting from botulism toxin tend to wear off after 3 months with a repeat injection required if benefit is to be maintained. Injections are usually done every 3-4 months with maximum effect peak achieved by about 2 or 3 weeks. Botulism toxin is expensive and you should be sure of what costs you will incur resulting from the injection.  The side effects of botulism toxin use for chronic migraine may include:   -Transient, and usually mild, facial weakness with facial injections  -Transient, and usually mild, head or neck weakness with head/neck injections  -Reduction or loss of forehead facial animation due to forehead muscle weakness  -Eyelid drooping  -Dry eye  -Pain at the site of injection or bruising at the site of  injection  -Double vision  -Potential unknown long term risks  Contraindications: You should not have Botox if you are pregnant, nursing, allergic to albumin, have an infection, skin condition, or muscle weakness at the site of the injection, or have myasthenia gravis, Lambert-Eaton syndrome, or ALS.  It is also possible that as with any injection, there may be an allergic reaction or no effect from the medication. Reduced effectiveness after repeated injections is sometimes seen and rarely infection at the injection site may occur. All care will be taken to prevent these side effects. If therapy is given over a long time, atrophy and wasting in the muscle injected may occur. Occasionally the patient's become refractory to treatment because they develop antibodies to the toxin. In this event, therapy needs to be modified.  I have read the above information and consent to the administration of botulism toxin.    BOTOX PROCEDURE NOTE FOR MIGRAINE HEADACHE    Contraindications and precautions discussed with patient(above). Aseptic procedure was observed and patient tolerated procedure. Procedure performed by Dr. Toni Clementine Soulliere  The condition has existed for more than 6 months, and pt does not have a diagnosis of ALS, Myasthenia Gravis or Lambert-Eaton Syndrome.  Risks and benefits of injections discussed and pt agrees to proceed with the procedure.  Written consent obtained  These injections are medically necessary. Pt  receives good benefits from these injections. These injections do not cause sedations or hallucinations which the oral therapies may cause.  Description of procedure:  The patient was placed in a sitting position. The standard protocol was used for Botox as follows, with 5 units of Botox injected at each site:   -Procerus muscle, midline injection  -Corrugator muscle, bilateral injection  -  Frontalis muscle, bilateral injection, with 2 sites each side, medial injection was  performed in the upper one third of the frontalis muscle, in the region vertical from the medial inferior edge of the superior orbital rim. The lateral injection was again in the upper one third of the forehead vertically above the lateral limbus of the cornea, 1.5 cm lateral to the medial injection site.  -Temporalis muscle injection, 4 sites, bilaterally. The first injection was 3 cm above the tragus of the ear, second injection site was 1.5 cm to 3 cm up from the first injection site in line with the tragus of the ear. The third injection site was 1.5-3 cm forward between the first 2 injection sites. The fourth injection site was 1.5 cm posterior to the second injection site.   -Occipitalis muscle injection, 3 sites, bilaterally. The first injection was done one half way between the occipital protuberance and the tip of the mastoid process behind the ear. The second injection site was done lateral and superior to the first, 1 fingerbreadth from the first injection. The third injection site was 1 fingerbreadth superiorly and medially from the first injection site.  -Cervical paraspinal muscle injection, 2 sites, bilateral knee first injection site was 1 cm from the midline of the cervical spine, 3 cm inferior to the lower border of the occipital protuberance. The second injection site was 1.5 cm superiorly and laterally to the first injection site.  -Trapezius muscle injection was performed at 3 sites, bilaterally. The first injection site was in the upper trapezius muscle halfway between the inflection point of the neck, and the acromion. The second injection site was one half way between the acromion and the first injection site. The third injection was done between the first injection site and the inflection point of the neck.   Will return for repeat injection in 3 months.   200 units of Botox was used, any Botox not injected was wasted. The patient tolerated the procedure well, there were no  complications of the above procedure.

## 2020-01-14 ENCOUNTER — Ambulatory Visit: Payer: Self-pay | Admitting: Neurology

## 2020-01-29 ENCOUNTER — Other Ambulatory Visit: Payer: Self-pay | Admitting: Neurology

## 2020-01-29 DIAGNOSIS — G43711 Chronic migraine without aura, intractable, with status migrainosus: Secondary | ICD-10-CM

## 2020-02-17 NOTE — Telephone Encounter (Signed)
I received patient's voicemail. However, Dr. Lucia Gaskins does not have a Botox spot until Oct/Nov. Is there a specific time I can place her on the schedule or should she wait?

## 2020-02-29 NOTE — Telephone Encounter (Signed)
We just had a cancellation for tomorrow at 11:30. I called the pt and provided her with the two options. She chose 11:30. Pt was placed on the schedule.

## 2020-02-29 NOTE — Telephone Encounter (Signed)
There is no availability that I have seen. I have been waiting on a cancellation. What do you suggest? She has been sending several messages.

## 2020-02-29 NOTE — Progress Notes (Signed)
Consent Form Botulism Toxin Injection For Chronic Migraine  03/01/2020: Doing well on Botox > 50% improvement in frequency and severity despite stress and home.   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  She is on Aimovig and was getting Botox for her migraines.  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  Reviewed orally with patient, additionally signature is on file:  Botulism toxin has been approved by the Federal drug administration for treatment of chronic migraine. Botulism toxin does not cure chronic migraine and it may not be effective in some patients.  The administration of botulism toxin is accomplished by injecting a small amount of toxin into the muscles of the neck and head. Dosage must be titrated for each individual. Any benefits resulting from botulism toxin tend to wear off after 3 months with a repeat injection required if benefit is to be maintained. Injections are usually done every 3-4 months with maximum effect peak achieved by about 2 or 3 weeks. Botulism toxin is expensive and you should be sure of what costs you will incur resulting from the injection.  The side effects of botulism toxin use for chronic migraine may include:   -Transient, and usually mild, facial weakness with facial injections  -Transient, and usually mild, head or neck weakness with head/neck injections  -Reduction or loss of forehead facial animation due to forehead muscle  weakness  -Eyelid drooping  -Dry eye  -Pain at the site of injection or bruising at the site of injection  -Double vision  -Potential unknown long term risks  Contraindications: You should not have Botox if you are pregnant, nursing, allergic to albumin, have an infection, skin condition, or muscle weakness at the site of the injection, or have myasthenia gravis, Lambert-Eaton syndrome, or ALS.  It is also possible that as with any injection, there may be an allergic reaction or no effect from the medication. Reduced effectiveness after repeated injections is sometimes seen and rarely infection at the injection site may occur. All care will be taken to prevent these side effects. If therapy is given over a long time, atrophy and wasting in the muscle injected may occur. Occasionally the patient's become refractory to treatment because they develop antibodies to the toxin. In this event, therapy needs to be modified.  I have read the above information and consent to the administration of botulism toxin.    BOTOX PROCEDURE NOTE FOR MIGRAINE HEADACHE    Contraindications and precautions discussed with patient(above). Aseptic procedure was observed and patient tolerated procedure. Procedure performed by Dr. Artemio Aly  The condition has existed for more than 6 months, and pt does not have a diagnosis of ALS, Myasthenia Gravis or Lambert-Eaton Syndrome.  Risks and benefits of injections discussed and pt agrees to proceed with the procedure.  Written consent obtained  These injections are medically necessary. Pt  receives good benefits from these injections. These injections do not cause sedations or hallucinations which the oral therapies may cause.  Description of procedure:  The patient was placed in a sitting position. The standard protocol was used for Botox as follows, with 5  units of Botox injected at each site:   -Procerus muscle, midline injection  -Corrugator muscle, bilateral  injection  -Frontalis muscle, bilateral injection, with 2 sites each side, medial injection was performed in the upper one third of the frontalis muscle, in the region vertical from the medial inferior edge of the superior orbital rim. The lateral injection was again in the upper one third of the forehead vertically above the lateral limbus of the cornea, 1.5 cm lateral to the medial injection site.  -Temporalis muscle injection, 4 sites, bilaterally. The first injection was 3 cm above the tragus of the ear, second injection site was 1.5 cm to 3 cm up from the first injection site in line with the tragus of the ear. The third injection site was 1.5-3 cm forward between the first 2 injection sites. The fourth injection site was 1.5 cm posterior to the second injection site.   -Occipitalis muscle injection, 3 sites, bilaterally. The first injection was done one half way between the occipital protuberance and the tip of the mastoid process behind the ear. The second injection site was done lateral and superior to the first, 1 fingerbreadth from the first injection. The third injection site was 1 fingerbreadth superiorly and medially from the first injection site.  -Cervical paraspinal muscle injection, 2 sites, bilateral knee first injection site was 1 cm from the midline of the cervical spine, 3 cm inferior to the lower border of the occipital protuberance. The second injection site was 1.5 cm superiorly and laterally to the first injection site.  -Trapezius muscle injection was performed at 3 sites, bilaterally. The first injection site was in the upper trapezius muscle halfway between the inflection point of the neck, and the acromion. The second injection site was one half way between the acromion and the first injection site. The third injection was done between the first injection site and the inflection point of the neck.   Will return for repeat injection in 3 months.   200 units of Botox was  used, any Botox not injected was wasted. The patient tolerated the procedure well, there were no complications of the above procedure.

## 2020-03-01 ENCOUNTER — Other Ambulatory Visit: Payer: Self-pay

## 2020-03-01 ENCOUNTER — Ambulatory Visit (INDEPENDENT_AMBULATORY_CARE_PROVIDER_SITE_OTHER): Payer: Medicare Other | Admitting: Neurology

## 2020-03-01 DIAGNOSIS — G43711 Chronic migraine without aura, intractable, with status migrainosus: Secondary | ICD-10-CM

## 2020-03-01 NOTE — Progress Notes (Signed)
Botox- 200 units x 1 vial Lot: [id number] Expiration: 10/2022 NDC: 0023-3921-02  Bacteriostatic 0.9% Sodium Chloride- 4mL total Lot: [id number] Expiration: 04/08/2021 NDC: 0409-1966-02  Dx: G43.711 B/B  

## 2020-03-16 ENCOUNTER — Telehealth: Payer: Self-pay | Admitting: Neurology

## 2020-03-16 NOTE — Telephone Encounter (Signed)
Received determination from [email or web address]. PA has been approved 12/17/19 until 03/16/21. Received a copy of the determination. Will fax a copy to patient's pharmacy.

## 2020-03-16 NOTE — Telephone Encounter (Signed)
Received a PA request for Aimovig 140mg . PA was started on . Key is [id number]. Per CMM, a determination will be made within the next 1-3 business days. Will check back later for a determination.

## 2020-03-17 ENCOUNTER — Telehealth: Payer: Self-pay | Admitting: *Deleted

## 2020-03-17 NOTE — Telephone Encounter (Signed)
Patient is able to get PRN migraine infusions at [facility] in [place]. DHE has been on backorder and is now back in stock. We received an updated request for infusion including DHE as part of migraine regimen.   1. Phenergan 25 mg IVP over 2 minutes (prn) 2. NS 5 mL IVP over 2 minutes 3. Toradol 30 mg IVP over 2 minutes (prn) 4. NS 5 mL IVP over 2 minutes 5. Decadron 10 mg IVP over 2 minutes (prn) 6. NS 5 mL IVP over 2 minutes 7. Mag Sulfate 1G in 500 mL NS via IV pump over 30 minutes 8. NS 5 mL IVP over 2 minutes 9. DHE 45 0.5 mg IVP over 2 minutes (prn) 10. NS 5 mL IVP over 2 minutes  Orders signed and faxed back to [facility]. Received a receipt of confirmation.

## 2020-03-22 ENCOUNTER — Other Ambulatory Visit: Payer: Self-pay

## 2020-03-22 DIAGNOSIS — G43711 Chronic migraine without aura, intractable, with status migrainosus: Secondary | ICD-10-CM

## 2020-03-22 MED ORDER — NURTEC 75 MG PO TBDP: 75.0000 mg | ORAL_TABLET | Freq: Every day | ORAL | 11 refills | Status: DC | PRN

## 2020-03-23 ENCOUNTER — Other Ambulatory Visit: Payer: Self-pay | Admitting: Neurology

## 2020-04-06 ENCOUNTER — Other Ambulatory Visit: Payer: Self-pay | Admitting: Neurology

## 2020-04-06 MED ORDER — PROMETHAZINE HCL 25 MG PO TABS: 25.0000 mg | ORAL_TABLET | Freq: Four times a day (QID) | ORAL | 2 refills | Status: DC | PRN

## 2020-04-19 ENCOUNTER — Ambulatory Visit: Payer: Medicare Other | Admitting: Neurology

## 2020-05-31 ENCOUNTER — Other Ambulatory Visit: Payer: Self-pay | Admitting: Neurology

## 2020-05-31 ENCOUNTER — Ambulatory Visit (INDEPENDENT_AMBULATORY_CARE_PROVIDER_SITE_OTHER): Payer: Medicare Other | Admitting: Neurology

## 2020-05-31 DIAGNOSIS — G43711 Chronic migraine without aura, intractable, with status migrainosus: Secondary | ICD-10-CM | POA: Diagnosis not present

## 2020-05-31 NOTE — Progress Notes (Signed)
Consent Form Botulism Toxin Injection For Chronic Migraine  05/31/2020: Doing well on Botox > 50% improvement in frequency and severity despite stress and home. 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  She is on Aimovig and was getting Botox for her migraines.  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  Reviewed orally with patient, additionally signature is on file:  Botulism toxin has been approved by the Federal drug administration for treatment of chronic migraine. Botulism toxin does not cure chronic migraine and it may not be effective in some patients.  The administration of botulism toxin is accomplished by injecting a small amount of toxin into the muscles of the neck and head. Dosage must be titrated for each individual. Any benefits resulting from botulism toxin tend to wear off after 3 months with a repeat injection required if benefit is to be maintained. Injections are usually done every 3-4 months with maximum effect peak achieved by about 2 or 3 weeks. Botulism toxin is expensive and you should be sure of what costs you will incur resulting from the injection.  The side effects of botulism toxin use for chronic migraine may include:   -Transient, and usually mild, facial weakness with facial injections  -Transient, and usually mild, head or neck weakness with head/neck injections  -Reduction or loss of forehead facial animation due  to forehead muscle weakness  -Eyelid drooping  -Dry eye  -Pain at the site of injection or bruising at the site of injection  -Double vision  -Potential unknown long term risks  Contraindications: You should not have Botox if you are pregnant, nursing, allergic to albumin, have an infection, skin condition, or muscle weakness at the site of the injection, or have myasthenia gravis, Lambert-Eaton syndrome, or ALS.  It is also possible that as with any injection, there may be an allergic reaction or no effect from the medication. Reduced effectiveness after repeated injections is sometimes seen and rarely infection at the injection site may occur. All care will be taken to prevent these side effects. If therapy is given over a long time, atrophy and wasting in the muscle injected may occur. Occasionally the patient's become refractory to treatment because they develop antibodies to the toxin. In this event, therapy needs to be modified.  I have read the above information and consent to the administration of botulism toxin.    BOTOX PROCEDURE NOTE FOR MIGRAINE HEADACHE    Contraindications and precautions discussed with patient(above). Aseptic procedure was observed and patient tolerated procedure. Procedure performed by Dr. Artemio Aly  The condition has existed for more than 6 months, and pt does not have a diagnosis of ALS, Myasthenia Gravis or Lambert-Eaton Syndrome.  Risks and benefits of injections discussed and pt agrees to proceed with the procedure.  Written consent obtained  These injections are medically necessary. Pt  receives good benefits from these injections. These injections do not cause sedations or hallucinations which the oral therapies may cause.  Description of procedure:  The patient was placed in a sitting position. The standard protocol was used for Botox  as follows, with 5 units of Botox injected at each site:   -Procerus muscle, midline injection  -Corrugator  muscle, bilateral injection  -Frontalis muscle, bilateral injection, with 2 sites each side, medial injection was performed in the upper one third of the frontalis muscle, in the region vertical from the medial inferior edge of the superior orbital rim. The lateral injection was again in the upper one third of the forehead vertically above the lateral limbus of the cornea, 1.5 cm lateral to the medial injection site.  -Temporalis muscle injection, 4 sites, bilaterally. The first injection was 3 cm above the tragus of the ear, second injection site was 1.5 cm to 3 cm up from the first injection site in line with the tragus of the ear. The third injection site was 1.5-3 cm forward between the first 2 injection sites. The fourth injection site was 1.5 cm posterior to the second injection site.   -Occipitalis muscle injection, 3 sites, bilaterally. The first injection was done one half way between the occipital protuberance and the tip of the mastoid process behind the ear. The second injection site was done lateral and superior to the first, 1 fingerbreadth from the first injection. The third injection site was 1 fingerbreadth superiorly and medially from the first injection site.  -Cervical paraspinal muscle injection, 2 sites, bilateral knee first injection site was 1 cm from the midline of the cervical spine, 3 cm inferior to the lower border of the occipital protuberance. The second injection site was 1.5 cm superiorly and laterally to the first injection site.  -Trapezius muscle injection was performed at 3 sites, bilaterally. The first injection site was in the upper trapezius muscle halfway between the inflection point of the neck, and the acromion. The second injection site was one half way between the acromion and the first injection site. The third injection was done between the first injection site and the inflection point of the neck.   Will return for repeat injection in 3 months.   200  units of Botox was used, any Botox not injected was wasted. The patient tolerated the procedure well, there were no complications of the above procedure.

## 2020-05-31 NOTE — Addendum Note (Signed)
Addended by: Bertram Savin on: 05/31/2020 02:21 PM   Modules accepted: Orders

## 2020-05-31 NOTE — Progress Notes (Signed)
Botox- 200 units x 1 vial Lot: [id number] Expiration: 12/2022 NDC: 2446-9507-22  Bacteriostatic 0.9% Sodium Chloride- 2mL total Lot: [id number] Expiration: 08/09/2021 NDC: 8335-8251-89  Dx: Q42.103 B/B

## 2020-06-06 ENCOUNTER — Other Ambulatory Visit: Payer: Self-pay | Admitting: Neurology

## 2020-06-06 MED ORDER — RIZATRIPTAN BENZOATE 10 MG PO TBDP: 10.0000 mg | ORAL_TABLET | ORAL | 11 refills | Status: AC | PRN

## 2020-06-09 ENCOUNTER — Other Ambulatory Visit: Payer: Self-pay | Admitting: Neurology

## 2020-06-09 DIAGNOSIS — G43711 Chronic migraine without aura, intractable, with status migrainosus: Secondary | ICD-10-CM

## 2020-09-07 ENCOUNTER — Other Ambulatory Visit: Payer: Self-pay | Admitting: Neurology

## 2020-10-11 MED ORDER — UBRELVY 100 MG PO TABS: ORAL_TABLET | ORAL | 2 refills | Status: DC

## 2020-10-11 NOTE — Addendum Note (Signed)
Addended by: Bertram Savin on: 10/11/2020 05:25 PM   Modules accepted: Orders

## 2020-10-12 ENCOUNTER — Telehealth: Payer: Self-pay | Admitting: *Deleted

## 2020-10-12 NOTE — Telephone Encounter (Signed)
Per [facility] [employer], UBRELVY Tablet Type of coverage approved: Prior Authorization This approval authorizes your coverage from 07/09/2020 - 10/12/2021.   Faxed approval letter to pt's pharmacy. Received a receipt of confirmation.

## 2020-10-12 NOTE — Telephone Encounter (Signed)
Completed Bernita Raisin PA on Cover My Meds. Key: [id number]. Awaiting determination from [employer] Part D within 1-3 business days.

## 2020-11-14 ENCOUNTER — Telehealth: Payer: Self-pay | Admitting: Neurology

## 2020-11-14 NOTE — Telephone Encounter (Signed)
Pt called and LVM stating that she was wanting to inform RN that she went today and got a migraine infusion. Please advise.

## 2020-11-15 NOTE — Telephone Encounter (Signed)
Received a fax from [facility] stating pt was given a migraine infusion for migraine 8/10. Pt tolerated infusion well and pain reduced to 4-5/10.

## 2020-11-28 ENCOUNTER — Other Ambulatory Visit: Payer: Self-pay | Admitting: Neurology

## 2020-12-01 DIAGNOSIS — G43711 Chronic migraine without aura, intractable, with status migrainosus: Secondary | ICD-10-CM

## 2020-12-01 MED ORDER — AIMOVIG 140 MG/ML ~~LOC~~ SOAJ: SUBCUTANEOUS | 3 refills | Status: DC

## 2020-12-06 ENCOUNTER — Ambulatory Visit (INDEPENDENT_AMBULATORY_CARE_PROVIDER_SITE_OTHER): Payer: Medicare Other | Admitting: Neurology

## 2020-12-06 ENCOUNTER — Telehealth: Payer: Self-pay | Admitting: *Deleted

## 2020-12-06 DIAGNOSIS — G43711 Chronic migraine without aura, intractable, with status migrainosus: Secondary | ICD-10-CM

## 2020-12-06 NOTE — Telephone Encounter (Signed)
Pt on the schedule today at 4 pm for botox.

## 2020-12-06 NOTE — Progress Notes (Signed)
Botox- 200 units x 1 vial Lot: [id number] Expiration: 07/2023 NDC: 0023-3921-02  Bacteriostatic 0.9% Sodium Chloride- 4mL total Lot: [id number] Expiration: 12/07/2021 NDC: 0409-1966-02  Dx: G43.711 B/B  

## 2020-12-06 NOTE — Progress Notes (Signed)
Consent Form Botulism Toxin Injection For Chronic Migraine   12/06/2020: She has been doing poorly since we have delayed botox for several months, we need to get back on schedule. +% orb oculi, +5 masseters, +10 right occipitalis area.  05/31/2020: Doing well on Botox > 50% improvement in frequency and severity despite stress and home. 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  She is on Aimovig and was getting Botox for her migraines.  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  Reviewed orally with patient, additionally signature is on file:  Botulism toxin has been approved by the Federal drug administration for treatment of chronic migraine. Botulism toxin does not cure chronic migraine and it may not be effective in some patients.  The administration of botulism toxin is accomplished by injecting a small amount of toxin into the muscles of the neck and head. Dosage must be titrated for each individual. Any benefits resulting from botulism toxin tend to wear off after 3 months with a repeat injection required if benefit is to be maintained. Injections are usually done every 3-4 months with maximum effect peak achieved by about 2 or 3 weeks. Botulism toxin is expensive and you should be sure of what costs you will incur resulting from the injection.  The side effects of botulism toxin use for chronic migraine may include:   -Transient, and usually  mild, facial weakness with facial injections  -Transient, and usually mild, head or neck weakness with head/neck injections  -Reduction or loss of forehead facial animation due to forehead muscle weakness  -Eyelid drooping  -Dry eye  -Pain at the site of injection or bruising at the site of injection  -Double vision  -Potential unknown long term risks  Contraindications: You should not have Botox if you are pregnant, nursing, allergic to albumin, have an infection, skin condition, or muscle weakness at the site of the injection, or have myasthenia gravis, Lambert-Eaton syndrome, or ALS.  It is also possible that as with any injection, there may be an allergic reaction or no effect from the medication. Reduced effectiveness after repeated injections is sometimes seen and rarely infection at the injection site may occur. All care will be taken to prevent these side effects. If therapy is given over a long time, atrophy and wasting in the muscle injected may occur. Occasionally the patient's become refractory to treatment because they develop antibodies to the toxin. In this event, therapy needs to be modified.  I have read the above information and consent to the administration of botulism toxin.    BOTOX PROCEDURE NOTE FOR MIGRAINE HEADACHE    Contraindications and precautions discussed with patient(above). Aseptic procedure was observed and patient tolerated procedure. Procedure performed by Dr. Artemio Aly  The condition has existed for more than 6 months, and pt does not have a diagnosis of ALS, Myasthenia Gravis or Lambert-Eaton Syndrome.  Risks and benefits of injections discussed and pt agrees to proceed with the procedure.  Written consent obtained  These injections are medically necessary. Pt  receives good benefits from these injections. These injections  do not cause sedations or hallucinations which the oral therapies may cause.  Description of procedure:  The patient was placed  in a sitting position. The standard protocol was used for Botox as follows, with 5 units of Botox injected at each site:   -Procerus muscle, midline injection  -Corrugator muscle, bilateral injection  -Frontalis muscle, bilateral injection, with 2 sites each side, medial injection was performed in the upper one third of the frontalis muscle, in the region vertical from the medial inferior edge of the superior orbital rim. The lateral injection was again in the upper one third of the forehead vertically above the lateral limbus of the cornea, 1.5 cm lateral to the medial injection site.  -Temporalis muscle injection, 4 sites, bilaterally. The first injection was 3 cm above the tragus of the ear, second injection site was 1.5 cm to 3 cm up from the first injection site in line with the tragus of the ear. The third injection site was 1.5-3 cm forward between the first 2 injection sites. The fourth injection site was 1.5 cm posterior to the second injection site.   -Occipitalis muscle injection, 3 sites, bilaterally. The first injection was done one half way between the occipital protuberance and the tip of the mastoid process behind the ear. The second injection site was done lateral and superior to the first, 1 fingerbreadth from the first injection. The third injection site was 1 fingerbreadth superiorly and medially from the first injection site.  -Cervical paraspinal muscle injection, 2 sites, bilateral knee first injection site was 1 cm from the midline of the cervical spine, 3 cm inferior to the lower border of the occipital protuberance. The second injection site was 1.5 cm superiorly and laterally to the first injection site.  -Trapezius muscle injection was performed at 3 sites, bilaterally. The first injection site was in the upper trapezius muscle halfway between the inflection point of the neck, and the acromion. The second injection site was one half way between the acromion and the first  injection site. The third injection was done between the first injection site and the inflection point of the neck.   Will return for repeat injection in 3 months.   200 units of Botox was used, any Botox not injected was wasted. The patient tolerated the procedure well, there were no complications of the above procedure.

## 2020-12-06 NOTE — Telephone Encounter (Signed)
Under the instruction of Dr. Lucia Gaskins, I called the patient and scheduled her for a Depacon infusion today at 3:30 pm followed by Botox at 4 pm.   Orders written for Depacon 1 gram IV x 1. Printed copy of insurance/demographic information for [facility].   Also note we received a report from [facility] stating patient received a migraine infusion on 12/01/20 for headache pain 10+ on scale of 0-10.  Patient received migraine infusion as ordered and tolerated well and her pain reduced to 6/10 post infusion.

## 2021-01-26 ENCOUNTER — Other Ambulatory Visit: Payer: Self-pay | Admitting: Neurology

## 2021-02-23 ENCOUNTER — Other Ambulatory Visit: Payer: Self-pay | Admitting: Neurology

## 2021-03-07 ENCOUNTER — Ambulatory Visit: Payer: Self-pay | Admitting: Neurology

## 2021-03-21 ENCOUNTER — Ambulatory Visit: Payer: Medicare Other | Admitting: Neurology

## 2021-03-22 ENCOUNTER — Telehealth: Payer: Self-pay | Admitting: *Deleted

## 2021-03-22 NOTE — Telephone Encounter (Signed)
Standing order for migraines reviewed and signed by Dr. Lucia Gaskins.  [phone], [phone].  Signed 03-20-21. To Medical records to scan.

## 2021-03-29 ENCOUNTER — Ambulatory Visit (INDEPENDENT_AMBULATORY_CARE_PROVIDER_SITE_OTHER): Payer: Medicare Other | Admitting: Adult Health

## 2021-03-29 VITALS — Wt 170.4 lb

## 2021-03-29 DIAGNOSIS — G43711 Chronic migraine without aura, intractable, with status migrainosus: Secondary | ICD-10-CM | POA: Diagnosis not present

## 2021-03-29 NOTE — Progress Notes (Signed)
Botox- 200 units x 1 vial Lot: [id number] Expiration: 08/2023 NDC: 0023-3921-02  Bacteriostatic 0.9% Sodium Chloride- 4mL total Lot: [id number] Expiration: 07/09/2022 NDC: 0409-1966-02  Dx: G43.711 B/B  

## 2021-03-29 NOTE — Progress Notes (Signed)
       BOTOX PROCEDURE NOTE FOR MIGRAINE HEADACHE    Contraindications and precautions discussed with patient(above). Aseptic procedure was observed and patient tolerated procedure. Procedure performed by Butch Penny, NP  The condition has existed for more than 6 months, and pt does not have a diagnosis of ALS, Myasthenia Gravis or Lambert-Eaton Syndrome.  Risks and benefits of injections discussed and pt agrees to proceed with the procedure.  Written consent obtained  These injections are medically necessary.She receives good benefits from these injections. These injections do not cause sedations or hallucinations which the oral therapies may cause.  Indication/Diagnosis: chronic migraine BOTOX(J0585) injection was performed according to protocol by [facility]. 200 units of BOTOX was dissolved into 4 cc NS.   NDC: [address]-28  Type of toxin: Botox  Botox- 200 units x 1 vial Lot: [id number] Expiration: 08/2023 NDC: 0737-1062-69  Bacteriostatic 0.9% Sodium Chloride- 65mL total Lot: [id number] Expiration: 07/09/2022 NDC: 7035-0093-81  Dx: W29.937 B/B   Description of procedure:  The patient was placed in a sitting position. The standard protocol was used for Botox as follows, with 5 units of Botox injected at each site:   -Procerus muscle, midline injection  -Corrugator muscle, bilateral injection  -Frontalis muscle, bilateral injection, with 2 sites each side, medial injection was performed in the upper one third of the frontalis muscle, in the region vertical from the medial inferior edge of the superior orbital rim. The lateral injection was again in the upper one third of the forehead vertically above the lateral limbus of the cornea, 1.5 cm lateral to the medial injection site.  -Temporalis muscle injection, 4 sites, bilaterally. The first injection was 3 cm above the tragus of the ear, second injection site was 1.5 cm to 3 cm up from the first injection site in line with  the tragus of the ear. The third injection site was 1.5-3 cm forward between the first 2 injection sites. The fourth injection site was 1.5 cm posterior to the second injection site.  -Occipitalis muscle injection, 3 sites, bilaterally. The first injection was done one half way between the occipital protuberance and the tip of the mastoid process behind the ear. The second injection site was done lateral and superior to the first, 1 fingerbreadth from the first injection. The third injection site was 1 fingerbreadth superiorly and medially from the first injection site.  -Cervical paraspinal muscle injection, 2 sites, bilateral knee first injection site was 1 cm from the midline of the cervical spine, 3 cm inferior to the lower border of the occipital protuberance. The second injection site was 1.5 cm superiorly and laterally to the first injection site.  -Trapezius muscle injection was performed at 3 sites, bilaterally. The first injection site was in the upper trapezius muscle halfway between the inflection point of the neck, and the acromion. The second injection site was one half way between the acromion and the first injection site. The third injection was done between the first injection site and the inflection point of the neck.   Will return for repeat injection in 3 months.   A 200 unit sof Botox was used, 155 units were injected, the rest of the Botox was wasted. The patient tolerated the procedure well, there were no complications of the above procedure.  Butch Penny, MSN, NP-C 03/29/2021, 8:35 AM [facility] [address], [address] [place], [place] [address] [phone]

## 2021-04-09 ENCOUNTER — Other Ambulatory Visit: Payer: Self-pay | Admitting: Neurology

## 2021-04-09 DIAGNOSIS — G43711 Chronic migraine without aura, intractable, with status migrainosus: Secondary | ICD-10-CM

## 2021-04-26 ENCOUNTER — Other Ambulatory Visit: Payer: Self-pay | Admitting: Neurology

## 2021-05-09 ENCOUNTER — Other Ambulatory Visit: Payer: Self-pay | Admitting: Neurology

## 2021-05-14 ENCOUNTER — Other Ambulatory Visit: Payer: Self-pay | Admitting: Neurology

## 2021-06-19 NOTE — Telephone Encounter (Signed)
Patient called and LVM regarding rescheduling Botox appointment tomorrow, 12/13. I returned patient's call but received VM, advised patient will be rescheduled to 12/27 at 1:00. Requested patient call back if she is unable to take this appointment.

## 2021-06-20 ENCOUNTER — Ambulatory Visit: Payer: Medicare Other | Admitting: Neurology

## 2021-07-04 ENCOUNTER — Ambulatory Visit: Payer: Medicare Other | Admitting: Neurology

## 2021-07-05 ENCOUNTER — Encounter: Payer: Self-pay | Admitting: Neurology

## 2021-07-09 DEATH — deceased

## 2021-07-11 ENCOUNTER — Telehealth: Payer: Self-pay | Admitting: Neurology

## 2021-07-11 NOTE — Telephone Encounter (Signed)
So very sorry to hear of this. Will send condolence card to the home.

## 2021-07-11 NOTE — Telephone Encounter (Signed)
Pt's mother Kalandra Masters called wanting to inform the provider that the pt has passed away on the July 28, 2023.

## 2021-10-03 ENCOUNTER — Ambulatory Visit: Payer: Medicare Other | Admitting: Neurology
